# Patient Record
Sex: Female | Born: 1955
Health system: Northeastern US, Community
[De-identification: ages and names within clinical notes are randomized; demographics above are authoritative.]

## PROBLEM LIST (undated history)

## (undated) DIAGNOSIS — I10 Essential (primary) hypertension: Secondary | ICD-10-CM

## (undated) DIAGNOSIS — Z8679 Personal history of other diseases of the circulatory system: Secondary | ICD-10-CM

## (undated) DIAGNOSIS — F330 Major depressive disorder, recurrent, mild: Secondary | ICD-10-CM

## (undated) DIAGNOSIS — Z973 Presence of spectacles and contact lenses: Secondary | ICD-10-CM

## (undated) DIAGNOSIS — N183 Chronic kidney disease, stage 3 (moderate): Secondary | ICD-10-CM

## (undated) DIAGNOSIS — F410 Panic disorder [episodic paroxysmal anxiety] without agoraphobia: Secondary | ICD-10-CM

## (undated) HISTORY — DX: Presence of spectacles and contact lenses: Z97.3

## (undated) HISTORY — DX: Essential (primary) hypertension: I10

## (undated) HISTORY — DX: Major depressive disorder, recurrent, mild: F33.0

## (undated) HISTORY — PX: CATARACT REMOVAL INSERTION OF LENS: OPH121

## (undated) HISTORY — DX: Panic disorder (episodic paroxysmal anxiety): F41.0

## (undated) HISTORY — DX: Chronic kidney disease, stage 3 (moderate): N18.3

## (undated) HISTORY — PX: OB ANTEPARTUM CARE CESAREAN DLVR & POSTPARTUM: REP299

---

## 2016-10-20 HISTORY — PX: HEMORRHOID SURGERY: SHX153

## 2017-10-27 ENCOUNTER — Encounter (HOSPITAL_BASED_OUTPATIENT_CLINIC_OR_DEPARTMENT_OTHER): Payer: Self-pay | Admitting: Physician Assistant

## 2017-10-27 ENCOUNTER — Telehealth (HOSPITAL_BASED_OUTPATIENT_CLINIC_OR_DEPARTMENT_OTHER): Payer: Self-pay | Admitting: Physician Assistant

## 2017-10-27 ENCOUNTER — Ambulatory Visit (HOSPITAL_BASED_OUTPATIENT_CLINIC_OR_DEPARTMENT_OTHER): Payer: Medicaid Other | Admitting: Physician Assistant

## 2017-10-27 VITALS — BP 172/90 | HR 69 | Resp 16 | Ht 59.0 in | Wt 178.0 lb

## 2017-10-27 DIAGNOSIS — E785 Hyperlipidemia, unspecified: Secondary | ICD-10-CM

## 2017-10-27 DIAGNOSIS — I10 Essential (primary) hypertension: Secondary | ICD-10-CM

## 2017-10-27 DIAGNOSIS — F410 Panic disorder [episodic paroxysmal anxiety] without agoraphobia: Secondary | ICD-10-CM

## 2017-10-27 DIAGNOSIS — H8309 Labyrinthitis, unspecified ear: Secondary | ICD-10-CM

## 2017-10-27 LAB — BASIC METABOLIC PANEL
ANION GAP: 7 mmol/L (ref 5–15)
BUN (UREA NITROGEN): 23 mg/dL — ABNORMAL HIGH (ref 7–18)
CALCIUM: 9.4 mg/dL (ref 8.5–10.1)
CARBON DIOXIDE: 29 mmol/L (ref 21–32)
CHLORIDE: 104 mmol/L (ref 98–107)
CREATININE: 1.3 mg/dL — ABNORMAL HIGH (ref 0.4–1.2)
ESTIMATED GLOMERULAR FILT RATE: 41 mL/min — ABNORMAL LOW (ref >60)
Glucose Random: 87 mg/dL (ref 74–160)
POTASSIUM: 4.1 mmol/L (ref 3.5–5.1)
SODIUM: 140 mmol/L (ref 136–145)

## 2017-10-27 LAB — THYROID SCREEN TSH REFLEX FT4: THYROID SCREEN TSH REFLEX FT4: 3.28 u[IU]/mL (ref 0.358–3.740)

## 2017-10-27 LAB — LIPID PANEL
Cholesterol: 204 mg/dL (ref 0–239)
HIGH DENSITY LIPOPROTEIN: 35 mg/dL — ABNORMAL LOW (ref 40–?)
LOW DENSITY LIPOPROTEIN DIRECT: 144 mg/dL (ref 0–189)
TRIGLYCERIDES: 243 mg/dL — ABNORMAL HIGH (ref 0–150)

## 2017-10-27 MED ORDER — LISINOPRIL 40 MG PO TABS
40.0000 mg | ORAL_TABLET | Freq: Every day | ORAL | 0 refills | Status: DC
Start: 2017-10-27 — End: 2017-11-17

## 2017-10-27 MED ORDER — SERTRALINE HCL 50 MG PO TABS: tablet | ORAL | 0 refills | 0 days | Status: DC

## 2017-10-27 MED ORDER — ATENOLOL 50 MG PO TABS: 50 mg | tablet | Freq: Every day | ORAL | 0 refills | 0 days | Status: DC

## 2017-10-27 MED ORDER — ATENOLOL 50 MG PO TABS
50.0000 mg | ORAL_TABLET | Freq: Every day | ORAL | 0 refills | Status: DC
Start: 2017-10-27 — End: 2017-11-17

## 2017-10-27 MED ORDER — LISINOPRIL 40 MG PO TABS: 40 mg | tablet | Freq: Every day | ORAL | 0 refills | 0 days | Status: DC

## 2017-10-27 MED ORDER — SERTRALINE HCL 50 MG PO TABS
ORAL_TABLET | ORAL | 0 refills | Status: DC
Start: 2017-10-27 — End: 2017-12-20

## 2017-10-27 MED ORDER — CHLORTHALIDONE 25 MG PO TABS: 13 mg | tablet | Freq: Every day | ORAL | 0 refills | 0 days | Status: DC

## 2017-10-27 MED ORDER — MECLIZINE HCL 25 MG PO TABS: 25 mg | tablet | Freq: Three times a day (TID) | ORAL | 0 refills | 0 days | Status: AC | PRN

## 2017-10-27 MED ORDER — MECLIZINE HCL 25 MG PO TABS
25.0000 mg | ORAL_TABLET | Freq: Three times a day (TID) | ORAL | 0 refills | Status: DC | PRN
Start: 2017-10-27 — End: 2019-04-10

## 2017-10-27 MED ORDER — CHLORTHALIDONE 25 MG PO TABS
12.5000 mg | ORAL_TABLET | Freq: Every day | ORAL | 0 refills | Status: DC
Start: 2017-10-27 — End: 2017-11-17

## 2017-10-27 NOTE — Telephone Encounter (Signed)
-----   Message from Raymon Mutton, LPN sent at 06/27/1094 11:52 AM EDT -----  Patient has appointment  Soon needs benefit review for:  tdap

## 2017-10-27 NOTE — Progress Notes (Signed)
Nurse from [facility] called the [facility] Department to complete a benefit analysis for the tdap Vaccine.     The vaccine is covered under the patient’s prescription coverage.     Please choose Prior Auth

## 2017-10-27 NOTE — Progress Notes (Signed)
Review of Systems   Constitutional: Negative for chills, fever, malaise/fatigue and weight loss.   HENT: Negative for congestion, hearing loss and tinnitus.    Eyes: Negative for blurred vision, double vision and photophobia.   Respiratory: Negative for cough and shortness of breath.    Cardiovascular: Negative for chest pain, palpitations and leg swelling.   Gastrointestinal: Negative for abdominal pain, blood in stool, constipation, diarrhea, heartburn, melena, nausea and vomiting.   Genitourinary: Negative for dysuria, frequency, hematuria and urgency.   Musculoskeletal: Negative for joint pain and myalgias.   Skin: Negative for itching and rash.   Neurological: Negative for dizziness, tingling, tremors and headaches.            Subjective     Sara Snyder is a 62 year old female presents as new pt with family who help provide history.     H/o: HTN - takes atenolol-chlorthalidone daily, captopril 2-3 times per week when BP elevated. Brought BP cuff showing reading 114/76 earlier today when she was not feeling anxious, BP tends to be very labile.  States AM readings usually 110-120/60-80, sometimes lower in afternoon down to 90/50    Reports questionable abnormality in kidney per last PCP - unable to provide more details    H/o HLD, treated in [place].      H/o anxiety, panic disorder. On sertraline  daily, stopped 2 mo ago, ran out of medication. Felt better on medication, has been very anxious with racing and disorganized thoughts per family.     H/o labyrinthitis, sometimes dizzy with nausea. Requesting medication for flares.       There is no problem list on file for this patient.      No current outpatient medications on file.  No current facility-administered medications for this visit.   Review of Patient's Allergies indicates:  Allergies not on file  Social History     Socioeconomic History    Marital status: Married     Spouse name: Not on file    Number of children: Not on file    Years of  education: Not on file    Highest education level: Not on file   Occupational History    Not on file   Social Needs    Financial resource strain: Not on file    Food insecurity:     Worry: Not on file     Inability: Not on file    Transportation needs:     Medical: Not on file     Non-medical: Not on file   Tobacco Use    Smoking status: Not on file   Substance and Sexual Activity    Alcohol use: Not on file    Drug use: Not on file    Sexual activity: Not on file   Lifestyle    Physical activity:     Days per week: Not on file     Minutes per session: Not on file    Stress: Not on file   Relationships    Social connections:     Talks on phone: Not on file     Gets together: Not on file     Attends religious service: Not on file     Active member of club or organization: Not on file     Attends meetings of clubs or organizations: Not on file     Relationship status: Not on file    Intimate partner violence:     Fear of current or ex  partner: Not on file     Emotionally abused: Not on file     Physically abused: Not on file     Forced sexual activity: Not on file   Other Topics Concern    Not on file   Social History Narrative    Not on file     No past surgical history on file.  No family history on file.           Objective     All ROS reviewed and discussed and are otherwise negative unless listed above.         PHYSICAL EXAM:   10/27/17  1440 10/27/17  1449 10/27/17  1512   BP: (!) 188/104 (!) 164/82 (!) 172/90   Pulse: 69     Resp: 16     SpO2: 99%     Weight: 80.7 kg (178 lb)     Height:  (1.499 m)         GENERAL APPEARANCE -  A&Ox3, WDWN, NAD  HEENT - head normocephalic, atraumatic, EOMI, PERRLA, conjunctiva clear bilaterally, no occular discharge.   LUNGS - Lungs CTATB without WRR. Normal inspiratory effort.    CARDIOVASCULAR -  RRR without S3, S4. No murmurs, clicks, gallops or rubs.  ABDOMEN - Abdomen soft, NTND. BS normal. No masses, no hepatosplenomegaly.  Extrem - no tremor or  edema.   SKIN - skin color, texture, temperature, and turgor are normal in the exposed areas without rash or concerning lesions  Psych -  Appears stated age, well groomed. Behavior anxious and fidgety. Mood and affect inappropriately anxious, nervous, distracted. Appears to have fair judgement and insight.     A/P:  1. Essential hypertension  Uncontrolled/, unclear if 2/2 anxiety or  Under-medication. Pt to call with med doses. Given review of home BP cuff reading x 1 seems most likely white coat HTN. Will check labs, for now pt to continue current meds and call with dosing so we can make adjustments as needed. Attn: DASH diet, hydration, cardio, weight loss. RTO for f/u with PCP 2 weeks  - BASIC METABOLIC PANEL  - HEMOGLOBIN A1C    2. Panic disorder  Pt incredibly anxious, to the point that we are unable to make much progress re: plan in visit. Will restart ssri - low dose, go slow. Reviewed SE/risk/benefits. Pt to establish with psychotherapy/pharm. Will r/o hyperthyroidism, though suspect normal.   - sertraline (ZOLOFT) 50 MG tablet; 1 tab daily for 4 weeks then 2 tabs daily  Dispense: 150 tablet; Refill: 0  - RFL TO ADULT OUTPT PSYCH (NEW) [facility]  - THYROID SCREEN TSH REFLEX FT4    3. Labyrinthitis, unspecified laterality  H/o, didn't get to go into much detail re: hx, so unsure if positional. Meclizine prn, fluids, low salt diet. F/u prn with flares  - meclizine (ANTIVERT) 25 MG TABS; Take 1 tablet by mouth every 8 (eight) hours as needed  Dispense: 90 tablet; Refill: 0    4. Hyperlipidemia, unspecified hyperlipidemia type  Unclear if pt currently on treatment, will check home meds and contact. In the meantime check labs, calculate ASCVD risk, f/u with PCP 2 weeks. Attn: diet, exercise, weight loss.   - LIPID PANEL        I have reviewed the past medical, surgical, social and family history and updated these sections of EpicCare as relevant. All interim labs, test results, and consult notes were reviewed and  discussed with patient. Medications were reconciled during  this visit and a current medication list was given to the patient at the end of the visit.      follow up as above with PCP to establish care    [name], PA-C    >32min was spent with patient and at least 50% of that time was spent in counseling and coordination of care.

## 2017-10-28 ENCOUNTER — Other Ambulatory Visit (HOSPITAL_BASED_OUTPATIENT_CLINIC_OR_DEPARTMENT_OTHER): Payer: Self-pay

## 2017-10-28 LAB — HEMOGLOBIN A1C
ESTIMATED AVERAGE GLUCOSE: 117 (ref 74–160)
HEMOGLOBIN A1C: 5.7 % — ABNORMAL HIGH (ref 4.0–5.6)

## 2017-11-17 ENCOUNTER — Encounter (HOSPITAL_BASED_OUTPATIENT_CLINIC_OR_DEPARTMENT_OTHER): Payer: Self-pay | Admitting: Internal Medicine

## 2017-11-17 ENCOUNTER — Ambulatory Visit (HOSPITAL_BASED_OUTPATIENT_CLINIC_OR_DEPARTMENT_OTHER): Payer: Medicaid Other | Admitting: Internal Medicine

## 2017-11-17 ENCOUNTER — Telehealth (HOSPITAL_BASED_OUTPATIENT_CLINIC_OR_DEPARTMENT_OTHER): Payer: Self-pay

## 2017-11-17 VITALS — BP 150/80 | HR 70 | Temp 97.2°F | Resp 16 | Ht 59.0 in | Wt 178.0 lb

## 2017-11-17 DIAGNOSIS — F410 Panic disorder [episodic paroxysmal anxiety] without agoraphobia: Secondary | ICD-10-CM

## 2017-11-17 DIAGNOSIS — R05 Cough: Secondary | ICD-10-CM

## 2017-11-17 DIAGNOSIS — F419 Anxiety disorder, unspecified: Secondary | ICD-10-CM

## 2017-11-17 DIAGNOSIS — I10 Essential (primary) hypertension: Secondary | ICD-10-CM

## 2017-11-17 DIAGNOSIS — R058 Other specified cough: Secondary | ICD-10-CM

## 2017-11-17 DIAGNOSIS — M2550 Pain in unspecified joint: Secondary | ICD-10-CM

## 2017-11-17 DIAGNOSIS — Z1239 Encounter for other screening for malignant neoplasm of breast: Secondary | ICD-10-CM

## 2017-11-17 DIAGNOSIS — Z23 Encounter for immunization: Secondary | ICD-10-CM

## 2017-11-17 DIAGNOSIS — Z789 Other specified health status: Secondary | ICD-10-CM

## 2017-11-17 DIAGNOSIS — R0602 Shortness of breath: Secondary | ICD-10-CM

## 2017-11-17 DIAGNOSIS — Z1211 Encounter for screening for malignant neoplasm of colon: Secondary | ICD-10-CM

## 2017-11-17 LAB — MICROALBUMIN RANDOM URINE
ALB/CREAT RATIO URINE RAN: 10 ug/mg (ref 0–30)
ALBUMIN URINE RANDOM: 0.9 mg/dL (ref 0.0–1.9)
CREATININE RANDOM URINE: 97 mg/dl

## 2017-11-17 MED ORDER — ATENOLOL 50 MG PO TABS: 50 mg | tablet | Freq: Every day | ORAL | 6 refills | 0 days | Status: DC

## 2017-11-17 MED ORDER — CETIRIZINE HCL 10 MG PO TABS
10.0000 mg | ORAL_TABLET | Freq: Every day | ORAL | 4 refills | Status: DC
Start: 2017-11-17 — End: 2017-12-20

## 2017-11-17 MED ORDER — ATENOLOL 50 MG PO TABS
50.0000 mg | ORAL_TABLET | Freq: Every day | ORAL | 0 refills | Status: DC
Start: 2017-11-17 — End: 2017-11-17

## 2017-11-17 MED ORDER — CLONAZEPAM 2 MG PO TABS
2.0000 mg | ORAL_TABLET | Freq: Every evening | ORAL | 0 refills | Status: DC
Start: 2017-11-17 — End: 2017-12-20

## 2017-11-17 MED ORDER — CHLORTHALIDONE 25 MG PO TABS
12.5000 mg | ORAL_TABLET | Freq: Every day | ORAL | 0 refills | Status: DC
Start: 2017-11-17 — End: 2017-11-17

## 2017-11-17 MED ORDER — CALCIUM CARBONATE-VITAMIN D 500-400 MG-UNIT PO TABS: 1 | tablet | Freq: Two times a day (BID) | ORAL | 11 refills | 0 days | Status: AC

## 2017-11-17 MED ORDER — CLONAZEPAM 2 MG PO TABS: 2 mg | tablet | Freq: Every evening | ORAL | 0 refills | 0 days | Status: DC

## 2017-11-17 MED ORDER — ATENOLOL 50 MG PO TABS: 50 mg | tablet | Freq: Every day | ORAL | 0 refills | 0 days | Status: DC

## 2017-11-17 MED ORDER — FLUTICASONE PROPIONATE 50 MCG/ACT NA SUSP: 1 | Bottle | Freq: Every day | NASAL | 5 refills | 0 days | Status: AC

## 2017-11-17 MED ORDER — FLUTICASONE PROPIONATE 50 MCG/ACT NA SUSP
1.0000 | Freq: Every day | NASAL | 5 refills | Status: DC
Start: 2017-11-17 — End: 2018-09-13

## 2017-11-17 MED ORDER — CHLORTHALIDONE 25 MG PO TABS: 13 mg | tablet | Freq: Every day | ORAL | 0 refills | 0 days | Status: DC

## 2017-11-17 MED ORDER — ACETAMINOPHEN 500 MG PO TABS: 500 mg | tablet | Freq: Four times a day (QID) | ORAL | 3 refills | 0 days | Status: AC | PRN

## 2017-11-17 MED ORDER — CALCIUM CARBONATE-VITAMIN D 500-400 MG-UNIT PO TABS
1.0000 | ORAL_TABLET | Freq: Two times a day (BID) | ORAL | 11 refills | Status: DC
Start: 2017-11-17 — End: 2018-09-13

## 2017-11-17 MED ORDER — CETIRIZINE HCL 10 MG PO TABS: 10 mg | tablet | Freq: Every day | ORAL | 4 refills | 0 days | Status: DC

## 2017-11-17 MED ORDER — CHLORTHALIDONE 25 MG PO TABS
12.5000 mg | ORAL_TABLET | Freq: Every day | ORAL | 6 refills | Status: DC
Start: 2017-11-17 — End: 2017-12-20

## 2017-11-17 MED ORDER — CHLORTHALIDONE 25 MG PO TABS: 13 mg | tablet | Freq: Every day | ORAL | 6 refills | 0 days | Status: DC

## 2017-11-17 MED ORDER — ATENOLOL 50 MG PO TABS
50.0000 mg | ORAL_TABLET | Freq: Every day | ORAL | 6 refills | Status: DC
Start: 2017-11-17 — End: 2017-12-20

## 2017-11-17 MED ORDER — ACETAMINOPHEN 500 MG PO TABS
500.0000 mg | ORAL_TABLET | Freq: Four times a day (QID) | ORAL | 3 refills | Status: DC | PRN
Start: 2017-11-17 — End: 2018-09-13

## 2017-11-17 NOTE — Progress Notes (Signed)
11/17/2017  VIS given prior to administration and reviewed with the patient and or legal guardian. Patient understands the disease and the vaccine. See immunization/Injection module or chart review for date of publication and additional information.  Anvika F Romolo Sieling,LPN

## 2017-11-17 NOTE — Telephone Encounter (Signed)
-----   Message from Raymon Mutton, LPN sent at 0/98/1191 12:09 PM EDT -----  Patient has appointment  Soon needs benefit review for:  tdap

## 2017-11-17 NOTE — H&P (Signed)
Pt feels safe at home.  Leala Bryand, 11/17/2017

## 2017-11-17 NOTE — Progress Notes (Unsigned)
nurse from [facility] called the [facility] to complete a benefit analysis for the tdap Vaccine.    The vaccine is covered under the patient’s hsn medical coverage.    Please choose Private

## 2017-11-17 NOTE — Progress Notes (Signed)
HPI  Sara Snyder is a 62 year old female presenting with c/o knee pain    - Pt has chronic bilateral knee and foot pain  - She requests medication to help with this pain  - Does not take Calcium or Vit D    HTN  Most Recent BP Reading(s)  11/17/17 : (!) 150/80  10/27/17 : (!) 172/90  - Taking chlorthalidone 12.5 mg daily and atenolol 50 mg daily  - She is no longer taking lisinopril, d/c'd because it gave her LE swelling  - Pt has never taken amlodipine    Anxiety/Panic disorder  - H/o anxiety, panic disorder  - Taking sertraline 50 mg BID  - Also akes clonazepam 2 mg nightly to help her sleep  - Requests clonazepam refill    Cough  - Pt has an allergic cough    Patient Active Problem List:     Essential hypertension     Labyrinthitis    Social History     Socioeconomic History    Marital status: Married     Spouse name: Not on file    Number of children: Not on file    Years of education: Not on file    Highest education level: Not on file   Occupational History    Not on file   Social Needs    Financial resource strain: Not on file    Food insecurity:     Worry: Not on file     Inability: Not on file    Transportation needs:     Medical: Not on file     Non-medical: Not on file   Tobacco Use    Smoking status: Former Smoker     Packs/day: 2.00     Years: 25.00     Pack years: 50.00     Last attempt to quit: 10/27/1977     Years since quitting: 40.0    Smokeless tobacco: Never Used   Substance and Sexual Activity    Alcohol use: Not Currently    Drug use: Never    Sexual activity: Not on file   Lifestyle    Physical activity:     Days per week: Not on file     Minutes per session: Not on file    Stress: Not on file   Relationships    Social connections:     Talks on phone: Not on file     Gets together: Not on file     Attends religious service: Not on file     Active member of club or organization: Not on file     Attends meetings of clubs or organizations: Not on file     Relationship status: Not  on file    Intimate partner violence:     Fear of current or ex partner: Not on file     Emotionally abused: Not on file     Physically abused: Not on file     Forced sexual activity: Not on file   Other Topics Concern    Not on file   Social History Narrative    Not on file     Past Surgical History:  10/2016: HEMORRHOID SURGERY  No date: OB ANTEPARTUM CARE CESAREAN DLVR & POSTPARTUM  No family history on file.      Current Outpatient Medications:  chlorthalidone (HYGROTEN) 25 MG tablet Take 0.5 tablets by mouth daily Disp: 15 tablet Rfl: 6   atenolol (TENORMIN) 50 MG tablet Take 1  tablet by mouth daily Disp: 30 tablet Rfl: 6   clonazePAM (KLONOPIN) 2 MG tablet Take 2 mg by mouth nightly Disp:  Rfl:    sertraline (ZOLOFT) 50 MG tablet 1 tab daily for 4 weeks then 2 tabs daily Disp: 150 tablet Rfl: 0   lisinopril (PRINIVIL,ZESTRIL) 40 MG tablet Take 1 tablet by mouth daily Disp: 30 tablet Rfl: 0   meclizine (ANTIVERT) 25 MG TABS Take 1 tablet by mouth every 8 (eight) hours as needed Disp: 90 tablet Rfl: 0     No current facility-administered medications for this visit.   Review of Patient's Allergies indicates:  No Known Allergies  Review of Systems   Constitution: Negative.   HENT: Negative.    Eyes: Negative.    Cardiovascular: Negative.    Respiratory: Positive for cough.    Skin: Negative.    Musculoskeletal: Positive for joint pain.   Gastrointestinal: Negative.    Genitourinary: Negative.    Neurological: Negative.    Psychiatric/Behavioral: The patient is nervous/anxious.    Allergic/Immunologic: Negative.      BP (!) 150/80 (Site: LA, Position: Sitting, Cuff Size: Reg)  Pulse 70  Temp 97.2 F (36.2 C) (Temporal)  Resp 16  Ht  (1.499 m)  Wt 80.7 kg (178 lb)  SpO2 98%  BMI 35.95 kg/m2  Physical Exam   Constitutional: She is oriented to person, place, and time and well-developed, well-nourished, and in no distress.   Pulmonary/Chest: Effort normal and breath sounds normal. She has no wheezes.    Neurological: She is alert and oriented to person, place, and time. Gait normal.   Skin: Skin is warm and dry.   Psychiatric: Mood, memory and judgment normal.   Nursing note and vitals reviewed.    Assessment /Plan   1. Essential hypertension  - Not well controlled, BP: 150/80 today  - Taking atenolol 50 mg daily and chlorthalidone 12.5 mg daily  - Will increase chlorthalidone dose from 12.5 mg daily -> 25 mg daily  - Check BP daily, record log and bring to next visit  - Will create plan next visit based on BP log and lab results  - Check MICROALBUMIN RANDOM URINE  - F/u in 3-4 weeks    2. Anxiety  3. Panic disorder  - Pt has chronic anxiety w/ panic disorder  - Takes sertraline 50 mg BID and clonazepam 2 mg nightly, this is working well for her  - Pt requests clonazepam refill as she has ran out  - Hesitant to start prescribing clonazepam 2 mg due to her decreased kidney functions and because of potential long term effects  Will Rx 1 mo supply of clonazepam, then pt will start managing meds w/ pscyhiatry, she has apt in 2 weeks  - Rx clonazePAM (KLONOPIN) 2 MG tablet; Take 1 tablet by mouth nightly  Dispense: 30 tablet; Refill: 0    4. Multiple joint pain  - chronic bilateral knee and foot pain  - Rx acetaminophen (TYLENOL) 500 MG tablet; Take 1 tablet by mouth every 6 (six) hours as needed for Pain  Dispense: 30 tablet; Refill: 3  - Rx calcium-vitamin D (OSCAL-500) 500-400 MG-UNIT per tablet; Take 1 tablet by mouth 2 (two) times daily  Dispense: 60 tablet; Refill: 11  - f/u if symptoms worsen or fail to improve    5. Need for prophylactic vaccination with combined diphtheria-tetanus-pertussis (DTP) vaccine  - Routine vaccination  - TDAP VACCINE 7 AND OLDER IM    6. Allergic  cough  - Pt has allergic cough  - Rx fluticasone (FLONASE) 50 MCG/ACT nasal spray; 1 spray by Each Nostril route daily  Dispense: 1 Bottle; Refill: 5  - Rx etirizine (ZYRTEC) 10 MG tablet; Take 1 tablet by mouth daily  Dispense: 30 tablet;  Refill: 4  - f/u if symptoms worsen or fail to improve    7. Shortness of breath  - Pt has occasional SOB  - May be due to panic attacks  - Will order echo to r/o cardiac etiology  - ECHOCARDIOGRAM WITH DOPPLER; Future    8. Screening for malignant neoplasm of breast  - Routine screening  - DIGITAL SCREEN MAM WCAD&TOM; Future    9. Special screening for malignant neoplasms, colon  - Routine screening  - POC IMMUNOASSAY FECAL OCCULT BLOOD TEST; Future      I have spent 40 minutes in face to face time with this patient/patient proxy of which > 50% was in counseling or coordination of care regarding above issues/Dx.    We discussed the importance of medication compliance. The patient was ready to learn and no apparent learning barriers were identified. I explained the diagnosis and treatment plan, and the patient expressed understanding of the content. Possible side effects of the prescribed medication(s) were explained. I attempted to answer any questions regarding the diagnosis and the proposed treatment.    We discussed the patients current medications. The patient expressed understanding and no barriers to adherence were identified.    Jean Rosenthal Fulk-Logon

## 2017-11-18 ENCOUNTER — Encounter (HOSPITAL_BASED_OUTPATIENT_CLINIC_OR_DEPARTMENT_OTHER): Payer: Self-pay

## 2017-11-29 ENCOUNTER — Encounter (HOSPITAL_BASED_OUTPATIENT_CLINIC_OR_DEPARTMENT_OTHER): Payer: Self-pay | Admitting: Physician Assistant

## 2017-11-29 DIAGNOSIS — E785 Hyperlipidemia, unspecified: Secondary | ICD-10-CM | POA: Insufficient documentation

## 2017-11-29 DIAGNOSIS — F410 Panic disorder [episodic paroxysmal anxiety] without agoraphobia: Secondary | ICD-10-CM | POA: Insufficient documentation

## 2017-11-29 HISTORY — DX: Panic disorder (episodic paroxysmal anxiety): F41.0

## 2017-11-30 ENCOUNTER — Ambulatory Visit (HOSPITAL_BASED_OUTPATIENT_CLINIC_OR_DEPARTMENT_OTHER): Payer: Medicaid Other

## 2017-12-02 ENCOUNTER — Ambulatory Visit (HOSPITAL_BASED_OUTPATIENT_CLINIC_OR_DEPARTMENT_OTHER): Payer: Medicaid Other

## 2017-12-02 DIAGNOSIS — Z1211 Encounter for screening for malignant neoplasm of colon: Secondary | ICD-10-CM

## 2017-12-02 LAB — POC IMMUNOASSAY FECAL OCCULT BLOOD TEST: POC FECAL OCCULT BLOOD TEST (IMMUNOASSAY): NEGATIVE

## 2017-12-02 NOTE — Progress Notes (Signed)
Pt dropped off IFOB specimen.  Specimen collected on 11/29/17  Released IFOB open order.  Tested specimen, result is negative.  Created and sent letter to pt.  Umer Harig, 12/02/2017

## 2017-12-07 ENCOUNTER — Ambulatory Visit (HOSPITAL_BASED_OUTPATIENT_CLINIC_OR_DEPARTMENT_OTHER): Payer: Self-pay | Admitting: Internal Medicine

## 2017-12-15 ENCOUNTER — Ambulatory Visit (HOSPITAL_BASED_OUTPATIENT_CLINIC_OR_DEPARTMENT_OTHER): Payer: Self-pay | Admitting: Internal Medicine

## 2017-12-15 ENCOUNTER — Encounter (HOSPITAL_BASED_OUTPATIENT_CLINIC_OR_DEPARTMENT_OTHER): Payer: Self-pay

## 2017-12-15 ENCOUNTER — Encounter (HOSPITAL_BASED_OUTPATIENT_CLINIC_OR_DEPARTMENT_OTHER): Payer: Self-pay | Admitting: Internal Medicine

## 2017-12-16 ENCOUNTER — Ambulatory Visit (HOSPITAL_BASED_OUTPATIENT_CLINIC_OR_DEPARTMENT_OTHER): Payer: Medicaid Other | Admitting: Internal Medicine

## 2017-12-20 ENCOUNTER — Ambulatory Visit: Payer: No Typology Code available for payment source

## 2017-12-20 ENCOUNTER — Ambulatory Visit: Payer: No Typology Code available for payment source | Attending: Internal Medicine | Admitting: Internal Medicine

## 2017-12-20 ENCOUNTER — Encounter (HOSPITAL_BASED_OUTPATIENT_CLINIC_OR_DEPARTMENT_OTHER): Payer: Self-pay | Admitting: Internal Medicine

## 2017-12-20 VITALS — BP 133/76 | HR 61 | Temp 98.2°F | Resp 14 | Ht 59.06 in | Wt 178.0 lb

## 2017-12-20 DIAGNOSIS — R05 Cough: Secondary | ICD-10-CM

## 2017-12-20 DIAGNOSIS — I10 Essential (primary) hypertension: Secondary | ICD-10-CM

## 2017-12-20 DIAGNOSIS — F419 Anxiety disorder, unspecified: Secondary | ICD-10-CM

## 2017-12-20 DIAGNOSIS — F410 Panic disorder [episodic paroxysmal anxiety] without agoraphobia: Secondary | ICD-10-CM

## 2017-12-20 DIAGNOSIS — H538 Other visual disturbances: Secondary | ICD-10-CM | POA: Diagnosis present

## 2017-12-20 DIAGNOSIS — J309 Allergic rhinitis, unspecified: Secondary | ICD-10-CM | POA: Diagnosis not present

## 2017-12-20 DIAGNOSIS — R053 Chronic cough: Secondary | ICD-10-CM | POA: Insufficient documentation

## 2017-12-20 MED ORDER — CHLORTHALIDONE 25 MG PO TABS
25.0000 mg | ORAL_TABLET | Freq: Every day | ORAL | 6 refills | Status: DC
Start: 2017-12-20 — End: 2017-12-28

## 2017-12-20 MED ORDER — CHLORTHALIDONE 25 MG PO TABS: 25 mg | tablet | Freq: Every day | ORAL | 6 refills | 0 days | Status: DC

## 2017-12-20 MED ORDER — CLONAZEPAM 2 MG PO TABS
2.0000 mg | ORAL_TABLET | Freq: Every evening | ORAL | 0 refills | Status: DC
Start: 2017-12-20 — End: 2017-12-28

## 2017-12-20 MED ORDER — SERTRALINE HCL 50 MG PO TABS: 50 mg | tablet | Freq: Every day | ORAL | 3 refills | 0 days | Status: DC

## 2017-12-20 MED ORDER — ATENOLOL 50 MG PO TABS
50.0000 mg | ORAL_TABLET | Freq: Every day | ORAL | 6 refills | Status: DC
Start: 2017-12-20 — End: 2017-12-28

## 2017-12-20 MED ORDER — FLUTICASONE PROPIONATE HFA 110 MCG/ACT IN AERO
1.0000 | INHALATION_SPRAY | Freq: Two times a day (BID) | RESPIRATORY_TRACT | 0 refills | Status: DC
Start: 2017-12-20 — End: 2017-12-28

## 2017-12-20 MED ORDER — LORATADINE 10 MG PO TABS
10.0000 mg | ORAL_TABLET | Freq: Every day | ORAL | 2 refills | Status: DC
Start: 2017-12-20 — End: 2017-12-28

## 2017-12-20 MED ORDER — ATENOLOL 50 MG PO TABS: 50 mg | tablet | Freq: Every day | ORAL | 6 refills | 0 days | Status: DC

## 2017-12-20 MED ORDER — CLONAZEPAM 2 MG PO TABS: 2 mg | tablet | Freq: Every evening | ORAL | 0 refills | 0 days | Status: DC

## 2017-12-20 MED ORDER — SERTRALINE HCL 50 MG PO TABS
50.0000 mg | ORAL_TABLET | Freq: Every day | ORAL | 3 refills | Status: DC
Start: 2017-12-20 — End: 2017-12-28

## 2017-12-20 MED ORDER — LORATADINE 10 MG PO TABS: 10 mg | tablet | Freq: Every day | ORAL | 2 refills | 0 days | Status: DC

## 2017-12-20 MED ORDER — FLUTICASONE PROPIONATE HFA 110 MCG/ACT IN AERO: 1 | Inhaler | Freq: Two times a day (BID) | RESPIRATORY_TRACT | 0 refills | 0 days | Status: DC

## 2017-12-20 NOTE — Assessment & Plan Note (Deleted)
Prior X-ray did not show anything. Pt has been advised to see a lung specialist to investigate possibilities of Asthma or Allergies

## 2017-12-20 NOTE — Progress Notes (Signed)
Patient feel safe at home. sm

## 2017-12-20 NOTE — Progress Notes (Addendum)
HPI  Sara CorwinMaria Snyder is a 62 year old female presenting for persistent cough.     Cough  -Dry Cough has been constant for two months  -Pt feels as if it is a constant cold or allergies  -Cough is worse at night, runny nose from 7-11 am  -No fever, no phlegm   -Some chest pain due to cough  -Cough was not present when pt recently visited [place] or ate cold foods previously  -pt has stopped eating cold foods as precautions, but cough still persists   -Was prescribed Flonase, did not help  -ER doctor gave pt an inhaler, did not help  -Prior X-ray did not show anything  -Pt does not use any allergy medication    Anxiety  -Pt feels panic associated with cough  -Has not been taking anxiety medications    Vision  -Needs to see eye doctor due to vision problems      Patient Active Problem List:     Essential hypertension     Labyrinthitis     Panic disorder     Hyperlipidemia    Social History     Socioeconomic History    Marital status: Married     Spouse name: Not on file    Number of children: Not on file    Years of education: Not on file    Highest education level: Not on file   Occupational History    Not on file   Social Needs    Financial resource strain: Not on file    Food insecurity:     Worry: Not on file     Inability: Not on file    Transportation needs:     Medical: Not on file     Non-medical: Not on file   Tobacco Use    Smoking status: Former Smoker     Packs/day: 2.00     Years: 25.00     Pack years: 50.00     Last attempt to quit: 10/27/1977     Years since quitting: 40.1    Smokeless tobacco: Never Used   Substance and Sexual Activity    Alcohol use: Not Currently    Drug use: Never    Sexual activity: Not on file   Lifestyle    Physical activity:     Days per week: Not on file     Minutes per session: Not on file    Stress: Not on file   Relationships    Social connections:     Talks on phone: Not on file     Gets together: Not on file     Attends religious service: Not on file     Active  member of club or organization: Not on file     Attends meetings of clubs or organizations: Not on file     Relationship status: Not on file    Intimate partner violence:     Fear of current or ex partner: Not on file     Emotionally abused: Not on file     Physically abused: Not on file     Forced sexual activity: Not on file   Other Topics Concern    Not on file   Social History Narrative    Not on file     Past Surgical History:  10/2016: HEMORRHOID SURGERY  No date: OB ANTEPARTUM CARE CESAREAN DLVR & POSTPARTUM  No family history on file.      Current Outpatient Medications:  chlorthalidone (HYGROTEN) 25 MG tablet Take 0.5 tablets by mouth daily Disp: 15 tablet Rfl: 6   atenolol (TENORMIN) 50 MG tablet Take 1 tablet by mouth daily Disp: 30 tablet Rfl: 6   clonazePAM (KLONOPIN) 2 MG tablet Take 1 tablet by mouth nightly Disp: 30 tablet Rfl: 0   acetaminophen (TYLENOL) 500 MG tablet Take 1 tablet by mouth every 6 (six) hours as needed for Pain Disp: 30 tablet Rfl: 3   fluticasone (FLONASE) 50 MCG/ACT nasal spray 1 spray by Each Nostril route daily Disp: 1 Bottle Rfl: 5   cetirizine (ZYRTEC) 10 MG tablet Take 1 tablet by mouth daily Disp: 30 tablet Rfl: 4   calcium-vitamin D (OSCAL-500) 500-400 MG-UNIT per tablet Take 1 tablet by mouth 2 (two) times daily Disp: 60 tablet Rfl: 11   sertraline (ZOLOFT) 50 MG tablet 1 tab daily for 4 weeks then 2 tabs daily Disp: 150 tablet Rfl: 0     No current facility-administered medications for this visit.   Review of Patient's Allergies indicates:  No Known Allergies  ROS    BP 133/76 (Site: RA, Position: Sitting, Cuff Size: Reg)  Pulse 61  Temp 98.2 F (36.8 C) (Oral)  Resp 14  Ht 4' 11.06" (1.5 m)  Wt 80.7 kg (178 lb)  SpO2 98%  BMI 35.88 kg/m2     Physical Exam   Pulmonary/Chest: Effort normal and breath sounds normal.   Physical Exam   Constitutional: She is oriented to person, place, and time and well-developed, well-nourished, and in no distress.   Neurological: She is  alert and oriented to person, place, and time. Gait normal.   Skin: Skin is warm and dry.   Psychiatric: Mood, memory, affect and judgment normal.   Nursing note and vitals reviewed.        Assessment /Plan   (R05) Chronic cough  (primary encounter diagnosis)  Comment:  Likely allergic component , also c/o scratching in her throat   Prior X-ray did not show anything as per patient, recent ED visit ,get records   Plan: Pt prescribed Claritin 1x a day in the morning  Pt has been given inhaler to use and has been advised to rinse mouth post use. 2x a day once in morning and night       ekg with [facility] , echo is ordered     :      (J30.9) Allergic rhinitis, unspecified seasonality, unspecified trigger  Comment:   Plan: flonase not helping   Will ask her to try claritin     (H53.8) Blurred vision  Comment:   Plan: REFERRAL TO OPHTHALMOLOGY ( INT)            (I10) Essential hypertension  Comment:refill meds well controlled   Plan: chlorthalidone (HYGROTEN) 25 MG tablet,         atenolol (TENORMIN) 50 MG tablet            (F41.0) Panic disorder  Comment:     Plan: sertraline (ZOLOFT) 50 MG tablet  Pt is also on clonazepam 2mg    Pt was supposed to follow up with psych and she no showed  Requested care partner sandra to talk to her   Pt with multiple issues   ecnouraged to return in 4 weeks to discuss other issues patient may have     C/o hair loss, several other c/o will be addressed in other visit   Need 40 min  I have spent 40 minutes in face to face time with this patient/patient proxy of which > 50% was in counseling or coordination of care regarding above issues/Dx.      We discussed the importance of medication compliance. The patient was ready to learn and no apparent learning barriers were identified. I explained the diagnosis and treatment plan, and the patient expressed understanding of the content. Possible side effects of the prescribed medication(s) were explained. I attempted to answer any questions  regarding the diagnosis and the proposed treatment.    We discussed the patients current medications. The patient expressed understanding and no barriers to adherence were identified.    This documentation serves as a record of the services and decisions personally performed by Overton Mam MD. It was created by Oren Binet (medical scribe) and was based on the provider's statements to me.

## 2017-12-20 NOTE — Progress Notes (Signed)
Office Visit with Mental Health Care Partner in Primary Care    [facility] does a warm hand-off with Mental Health Care Partner.      PROBLEM: Anxiety    ASSESSMENT:   [place], female, 62 yo.  Moved to [place] few [place] ago. Has been babysitting a 493 yo child at her house.  Lives with her husband. Has two adult daughters here. One of the daughters live at the same building than her. Has some conflicts with her daughter what makes her more agitated.    Reports long history of anxiety. Was in treatment in [place]. Had an appointment with psychiatrist Noralyn PickEdilia Gomes on 11/30/17 but did not show up. We called [facility] and appointment was rescheduled for 01/05/18.     She does not sleep well. She loves to play games on her computer or cell phone. Spend several hours on the screen. Goes to bed between midnight and 1 am playing games.   Drinks one cup of coffee in the morning.   Does not exercise. Usually does not go out. "I have pain in my legs and cannot walk much".    She is open for psychotherapy and psycho pharm.     INTERVENTION:   - Emotional support  - Reviewed sleep hygiene (suggested to decrease screen time at least half an hour before bed). Try to read a book or her Bible instead.   - Encouraged physical activities (walk or take the child she babysitt to the park...)  - Discussed about breathing exercises (showed the app Breathe2Relax and practiced breathing exercises during visit. Husband was present at the visit and very supportive about relaxation techniques and says: "I always tell her and shows her some exercises I find on Youtube but she does not listen to me".     PLAN:   - Initiate treatment with psychiatrist on 01/05/18  - Make changes on her sleep routine  - Increase physical activities  - Return as needed.     Bobbe MedicoSandra Criag Wicklund, 12/20/2017

## 2017-12-24 LAB — EKG

## 2017-12-27 ENCOUNTER — Encounter (HOSPITAL_BASED_OUTPATIENT_CLINIC_OR_DEPARTMENT_OTHER): Payer: Self-pay | Admitting: Internal Medicine

## 2017-12-27 DIAGNOSIS — I10 Essential (primary) hypertension: Secondary | ICD-10-CM

## 2017-12-27 DIAGNOSIS — F419 Anxiety disorder, unspecified: Secondary | ICD-10-CM

## 2017-12-27 DIAGNOSIS — F410 Panic disorder [episodic paroxysmal anxiety] without agoraphobia: Secondary | ICD-10-CM

## 2017-12-28 MED ORDER — FLUTICASONE PROPIONATE HFA 110 MCG/ACT IN AERO: 1 | Inhaler | Freq: Two times a day (BID) | RESPIRATORY_TRACT | 0 refills | 0 days | Status: AC

## 2017-12-28 MED ORDER — CLONAZEPAM 2 MG PO TABS
2.0000 mg | ORAL_TABLET | Freq: Every evening | ORAL | 0 refills | Status: DC
Start: 2017-12-28 — End: 2018-02-03

## 2017-12-28 MED ORDER — SERTRALINE HCL 50 MG PO TABS: 50 mg | tablet | Freq: Every day | ORAL | 3 refills | 0 days | Status: DC

## 2017-12-28 MED ORDER — CHLORTHALIDONE 25 MG PO TABS: 25 mg | tablet | Freq: Every day | ORAL | 6 refills | 0 days | Status: DC

## 2017-12-28 MED ORDER — LORATADINE 10 MG PO TABS: 10 mg | tablet | Freq: Every day | ORAL | 2 refills | 0 days | Status: AC

## 2017-12-28 MED ORDER — CLONAZEPAM 2 MG PO TABS: 2 mg | tablet | Freq: Every evening | ORAL | 0 refills | 0 days | Status: DC

## 2017-12-28 MED ORDER — SERTRALINE HCL 50 MG PO TABS
50.0000 mg | ORAL_TABLET | Freq: Every day | ORAL | 3 refills | Status: DC
Start: 2017-12-28 — End: 2018-01-05

## 2017-12-28 MED ORDER — CHLORTHALIDONE 25 MG PO TABS
25.0000 mg | ORAL_TABLET | Freq: Every day | ORAL | 6 refills | Status: DC
Start: 2017-12-28 — End: 2018-03-28

## 2017-12-28 MED ORDER — LORATADINE 10 MG PO TABS
10.0000 mg | ORAL_TABLET | Freq: Every day | ORAL | 2 refills | Status: DC
Start: 2017-12-28 — End: 2018-09-13

## 2017-12-28 MED ORDER — ATENOLOL 50 MG PO TABS
50.0000 mg | ORAL_TABLET | Freq: Every day | ORAL | 6 refills | Status: DC
Start: 2017-12-28 — End: 2018-06-29

## 2017-12-28 MED ORDER — FLUTICASONE PROPIONATE HFA 110 MCG/ACT IN AERO
1.0000 | INHALATION_SPRAY | Freq: Two times a day (BID) | RESPIRATORY_TRACT | 0 refills | Status: DC
Start: 2017-12-28 — End: 2019-07-07

## 2017-12-28 MED ORDER — ATENOLOL 50 MG PO TABS: 50 mg | tablet | Freq: Every day | ORAL | 6 refills | 0 days | Status: AC

## 2018-01-05 ENCOUNTER — Ambulatory Visit: Payer: No Typology Code available for payment source | Attending: Physician Assistant

## 2018-01-05 DIAGNOSIS — F410 Panic disorder [episodic paroxysmal anxiety] without agoraphobia: Secondary | ICD-10-CM | POA: Diagnosis not present

## 2018-01-05 DIAGNOSIS — F330 Major depressive disorder, recurrent, mild: Secondary | ICD-10-CM

## 2018-01-05 HISTORY — DX: Major depressive disorder, recurrent, mild: F33.0

## 2018-01-05 MED ORDER — SERTRALINE HCL 100 MG PO TABS
100.0000 mg | ORAL_TABLET | Freq: Every day | ORAL | 0 refills | Status: DC
Start: 2018-01-05 — End: 2018-02-03

## 2018-01-05 MED ORDER — SERTRALINE HCL 100 MG PO TABS: 100 mg | tablet | Freq: Every day | ORAL | 0 refills | 0 days | Status: DC
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
## 2018-02-03 ENCOUNTER — Ambulatory Visit: Payer: No Typology Code available for payment source

## 2018-02-03 DIAGNOSIS — F330 Major depressive disorder, recurrent, mild: Secondary | ICD-10-CM | POA: Diagnosis not present

## 2018-02-03 DIAGNOSIS — F410 Panic disorder [episodic paroxysmal anxiety] without agoraphobia: Secondary | ICD-10-CM | POA: Diagnosis not present

## 2018-02-03 MED ORDER — SERTRALINE HCL 100 MG PO TABS: 100 mg | tablet | Freq: Every day | ORAL | 2 refills | 0 days | Status: DC

## 2018-02-03 MED ORDER — CLONAZEPAM 1 MG PO TABS: 1 mg | tablet | Freq: Two times a day (BID) | ORAL | 2 refills | 0 days | Status: DC

## 2018-02-03 MED ORDER — TRAZODONE HCL 50 MG PO TABS: 50 mg | tablet | Freq: Every evening | ORAL | 2 refills | 0 days | Status: DC

## 2018-02-03 MED ORDER — SERTRALINE HCL 100 MG PO TABS
100.0000 mg | ORAL_TABLET | Freq: Every day | ORAL | 2 refills | Status: DC
Start: 2018-02-03 — End: 2018-03-28

## 2018-02-03 MED ORDER — CLONAZEPAM 1 MG PO TABS
1.0000 mg | ORAL_TABLET | Freq: Two times a day (BID) | ORAL | 2 refills | Status: DC
Start: 2018-02-03 — End: 2018-03-28

## 2018-02-03 MED ORDER — TRAZODONE HCL 50 MG PO TABS
50.0000 mg | ORAL_TABLET | Freq: Every evening | ORAL | 2 refills | Status: DC
Start: 2018-02-03 — End: 2018-03-28

## 2018-02-03 MED FILL — CLONAZEPAM  1MG: 30 days supply | Qty: 60 | Fill #0 | Status: CP

## 2018-02-03 MED FILL — SERTRALINE 100MG: 30 days supply | Qty: 30 | Fill #0 | Status: CP

## 2018-02-03 MED FILL — TRAZODONE  50MG: 30 days supply | Qty: 30 | Fill #0 | Status: CP

## 2018-02-03 NOTE — Progress Notes (Signed)
OUTPATIENT PSYCHIATRY PROGRESS NOTE      INTERPRETER: None needed.  INTERPRETER NAME: N/A      CONTACT INFO FOR OTHER AGENCIES AND MENTAL HEALTH PROVIDERS:   PCP: Overton Mam, MD      FOCUS OF CURRENT SESSION:  Problem 1: Depression   Problem 2: Anxiety        SUBJECTIVE:  TODAY'S CHIEF COMPLAINT AND CLINICAL UPDATES IN PATIENT'S WORDS:  1) Chief Complaint:  "I am feeling worried"       2) new information from patient and/or collateral:  This is our second visit. Patient is accompanied by her husband. She gives permission for her husband to participate fully in the session.   Initially, pt had difficulties focusing on her issues. She wanted to discuss her husband's problems instead.   She had difficulties ending the session on time.     She needed a lot of redirection. She reported feeling anxious due to her husband's recent diagnosis of kidney cancer.   He is going to have surgery to have one kidney removed.    She is concerned with the fact that her husband will have to take some time off of work.   They live with their daughter and they don't want to be burden to her family.   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      Patient is also upset because her daughters are arguing with each other regarding her husband's cancer diagnosis.   One of the daughters does not want him to have surgery.   Pt's husband indicated that he does not believe that he has cancer, but he is willing to have surgery.     Pt is feeling anxious with daily panic attacks. She continues taking 2 mg of Clonazepam at night.   She is not sleeping well at night.   She is requesting a sleep aid and a medication for anxiety during the day.     The dose of Sertraline was increased from 50 to 100 mg during our last visit.   She is not sure if the increased dose was helpful.   She does not want to increase it further despite increased anxiety.     She continues to feel sad,  but she denied SI, HI, psychosis, substance abuse problems.   She continues to struggle with labyrinthitis and chronic coffee of unknown cause.       OBJECTIVE:  DATA REVIEWED: Chart        CURRENT MEDICATIONS:    sertraline (ZOLOFT) 100 MG tablet, Take 1 tablet by mouth daily Cancel refills on 50 mg., Disp: 30 tablet, Rfl: 0    clonazePAM (KLONOPIN) 2 MG tablet, Take 1 tablet by mouth nightly, Disp: 30 tablet, Rfl: 0    loratadine (CLARITIN) 10 MG tablet, Take 1 tablet by mouth daily, Disp: 30 tablet, Rfl: 2    fluticasone (FLOVENT HFA) 110 MCG/ACT inhaler, Inhale 1 puff into the lungs 2 (two) times daily  for 10 days, Disp: 1 Inhaler, Rfl: 0    chlorthalidone (HYGROTEN) 25 MG tablet, Take 1 tablet by mouth daily, Disp: 30 tablet, Rfl: 6    atenolol (TENORMIN) 50 MG tablet, Take 1 tablet by mouth daily, Disp: 30 tablet, Rfl: 6    acetaminophen (TYLENOL) 500 MG tablet, Take 1 tablet by mouth every 6 (six) hours as needed for Pain, Disp: 30 tablet, Rfl: 3    fluticasone (FLONASE) 50  MCG/ACT nasal spray, 1 spray by Each Nostril route daily, Disp: 1 Bottle, Rfl: 5    calcium-vitamin D (OSCAL-500) 500-400 MG-UNIT per tablet, Take 1 tablet by mouth 2 (two) times daily, Disp: 60 tablet, Rfl: 11      MEDICATION ADHERENCE (including barriers and how addressed): Yes.    MEDICATIONS SIDE EFFECTS: None    PAST MEDICATIONS:    Fluoxetine    BIRTH CONTROL (ask females and males): N/A    CURRENT PREGNANCY: N/A    AIMS: N/A     MENTAL STATUS EXAMINATION                General Appearance: Casually dressed, groomed   Interaction with Interviewer (eye contact, attitude, behavior): Yes.   Physical Signs   Gait and Station (how patient walks and stands): Within normal limits   Physical Appearance: Within normal limits  Normal Movements: Within normal limits   Speech (rate, volume, articulation): Very talkative  Language: Within normal limits, speaking in [place]  Mood: "Worried"   Affect: Congruent   Thought Process:  Logical   Thought Content:  Tangential   Perceptions: No hallucinations   Impulse Control: Good   Cognition (Link to [facility])   Orientation (person, place, time): Yes   Recent and remote memory: Intact   Attention span and concentration: Limited   Fund of knowledge, awareness of current events and vocabulary: Yes   Judgment: Yes   Insight: Limited       SAFETY EXAMINATION (if yes describe findings in detail)   Suicidality: No   Homicidality: No   Aggression toward others:No   Victim or at risk of aggression by others: No   Substance use/abuse (legal and illicit): No         Risk Level Assessment  Risk Level Change (if yes, please describe): No    Suicide: low (1)  Violence: low (1)  Addiction: low (1)        Protective factors: Family connections, faith.       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       CLINICAL FORMULATION   Sara CorwinMaria Mcleish is a 62 year old, married, [place] woman, mother of four children who was referred for psychopharm evaluation and treatment by her PCP.   She has a history of chronic anxiety with panic attacks and depression.   The client has no prior history of psychiatric hospitalizations, mania, psychosis, safety issues, violence or substance abuse issues.   Past medical history includes: Essential hypertension, labyrinthitis, hyperlipidemia and chronic cough  There is a strong familial predisposition to psychiatric problems since family history is positive for depression and anxiety.   Psychiatric symptoms started in [place] in 1996 in the context of work related stress. They recently exacerbated due to immigration related problems and medication  discontinuation. Pt has been in tx with Clonazepam and Sertraline since 2005. She ran out of Sertraline recently. Her PCP restarted it, but she was taking a subtherapeutic dose during the evaluation. It was increased from 50 to 100 mg during the evaluation since she endorsed symptoms of anxiety leading  to panic attacks and depression.   She was adamant about continuing with the same dose of Clonazepam. This will need to be monitored.   Psychotherapy tx continues to be recommended but she continues to refuse due to distance.     Patient's ethnic and cultural factors: Recent [place] immigrant, not able to speak [place].     Patient's religion and spiritual beliefs, values and preferences: Ephriam KnucklesChristian, believes in God. Listens to [employer] and goes to church.        TODAY'S ASSESSMENT:  Ongoing symptoms of depression and anxiety due to new stressors (husband's cancer diagnosis)       PLAN  PLAN FOR MANAGING RISK (Consider risk plan for patients at moderate or high risk for suicide/violence/addiction; medication plan; referrals, etc. Must coincide with treatment plan.):   Not at moderate or hight risk.      PLAN:   Continue Sertraline, 100 mg daily (pt does not want to increase the dose of Sertraline despite symptoms of anxiety.   Split dose of Clonazepam from 2 mg QHS to 1 mg BID for now.   Will continue to monitor appropriateness of Clonazepam.   Start Trazodone, 50 mg QHS for sleep.   Continue to suggest psychotherapy tx. Pt continues to decline.   F/U in four  weeks, call sooner if needed   Call 911 or proceed to the nearest ER in case symptoms worsen acutely or in the event of any other psychiatric emergency.  Collaborate with other providers as needed   Order complementary labs and tests as needed       INFORMED CONSENT (for any new treatment): Patient was informed of the potential risks and benefits of the treatment, including the option not to treat, and appeared to understand and agreed to comply.  Discussion included the following key points:    Reviewed benefits and side effects of Trazodone, along with Clonazepam and Sertraline.   Pt understands and consents fully     REVIEWING TODAY'S VISIT  CLINICAL INTERVENTIONS TODAY:   -Provided medication management-Reviewed medication adherence/tolerance.  -Performed mental status and risk assessments.   -Provided psychoeducation, support.       PATIENT'S RESPONSE TO INTERVENTIONS: Receptive     PROGRESS TOWARDS GOALS:  Slow progress     Estimated time spent in psychotherapy: N/A      FOR PRESCRIBERS and EVALUATION AND MANAGEMENT VISITS ONLY    Use only when no psychotherapy performed  COUNSELING AND COORDINATION OF CARE PROVIDED (Consider diagnostic results/impressions and/or recommended studies; risks and benefits of treatment options; instruction for management/treatment and/or follow-up; importance of compliance with chosen treatment options; risk factor reduction; patient/family/caregiver education; prognosis): Yes:       Over 50% of time during today's visit was devoted to counseling and/or coordination of care.  If yes, record estimated duration of the face to face encounter.    YES,  ESTIMATED TIME: 30 minutes     INSTRUCTIONS TO COVERING CLINICIANS:  OK to refill.    Noralyn PickEdilia Elke Holtry, APRN

## 2018-02-07 ENCOUNTER — Ambulatory Visit: Payer: No Typology Code available for payment source | Attending: Internal Medicine | Admitting: Internal Medicine

## 2018-02-07 ENCOUNTER — Encounter (HOSPITAL_BASED_OUTPATIENT_CLINIC_OR_DEPARTMENT_OTHER): Payer: Self-pay | Admitting: Internal Medicine

## 2018-02-07 ENCOUNTER — Encounter (HOSPITAL_BASED_OUTPATIENT_CLINIC_OR_DEPARTMENT_OTHER): Payer: Self-pay

## 2018-02-07 VITALS — BP 165/77 | HR 69 | Temp 97.8°F | Resp 18 | Ht 59.11 in | Wt 180.0 lb

## 2018-02-07 DIAGNOSIS — K029 Dental caries, unspecified: Secondary | ICD-10-CM | POA: Insufficient documentation

## 2018-02-07 DIAGNOSIS — R7303 Prediabetes: Secondary | ICD-10-CM | POA: Diagnosis present

## 2018-02-07 DIAGNOSIS — R05 Cough: Secondary | ICD-10-CM

## 2018-02-07 DIAGNOSIS — N183 Chronic kidney disease, stage 3 (moderate): Secondary | ICD-10-CM

## 2018-02-07 DIAGNOSIS — H8309 Labyrinthitis, unspecified ear: Secondary | ICD-10-CM

## 2018-02-07 DIAGNOSIS — K219 Gastro-esophageal reflux disease without esophagitis: Secondary | ICD-10-CM | POA: Insufficient documentation

## 2018-02-07 DIAGNOSIS — I10 Essential (primary) hypertension: Secondary | ICD-10-CM | POA: Diagnosis not present

## 2018-02-07 DIAGNOSIS — L659 Nonscarring hair loss, unspecified: Secondary | ICD-10-CM

## 2018-02-07 DIAGNOSIS — N1832 Chronic kidney disease, stage 3b: Secondary | ICD-10-CM

## 2018-02-07 DIAGNOSIS — E785 Hyperlipidemia, unspecified: Secondary | ICD-10-CM

## 2018-02-07 DIAGNOSIS — I83893 Varicose veins of bilateral lower extremities with other complications: Secondary | ICD-10-CM | POA: Insufficient documentation

## 2018-02-07 DIAGNOSIS — F410 Panic disorder [episodic paroxysmal anxiety] without agoraphobia: Secondary | ICD-10-CM | POA: Diagnosis present

## 2018-02-07 DIAGNOSIS — R053 Chronic cough: Secondary | ICD-10-CM

## 2018-02-07 LAB — BASIC METABOLIC PANEL
ANION GAP: 6 mmol/L (ref 5–15)
BUN (UREA NITROGEN): 24 mg/dL — ABNORMAL HIGH (ref 7–18)
CALCIUM: 8.7 mg/dL (ref 8.5–10.1)
CARBON DIOXIDE: 30 mmol/L (ref 21–32)
CHLORIDE: 104 mmol/L (ref 98–107)
CREATININE: 1.1 mg/dL (ref 0.4–1.2)
ESTIMATED GLOMERULAR FILT RATE: 50 mL/min — ABNORMAL LOW (ref >60)
Glucose Random: 117 mg/dL (ref 74–160)
POTASSIUM: 3.9 mmol/L (ref 3.5–5.1)
SODIUM: 140 mmol/L (ref 136–145)

## 2018-02-07 LAB — HEPATIC FUNCTION PANEL
ALANINE AMINOTRANSFERASE: 22 U/L (ref 12–45)
ALBUMIN: 3.6 g/dL (ref 3.4–5.0)
ALKALINE PHOSPHATASE: 102 U/L (ref 45–117)
ASPARTATE AMINOTRANSFERASE: 18 U/L (ref 8–34)
BILIRUBIN DIRECT: <0.1 mg/dl (ref 0.0–0.2)
BILIRUBIN TOTAL: 0.2 mg/dL (ref 0.2–1.0)
TOTAL PROTEIN: 7.1 g/dL (ref 6.4–8.2)

## 2018-02-07 LAB — THYROID SCREEN TSH REFLEX FT4: THYROID SCREEN TSH REFLEX FT4: 3.39 u[IU]/mL (ref 0.358–3.740)

## 2018-02-07 MED ORDER — RANITIDINE HCL 150 MG PO TABS: 150 mg | tablet | Freq: Every evening | ORAL | 2 refills | 0 days | Status: AC

## 2018-02-07 MED ORDER — LOSARTAN POTASSIUM 25 MG PO TABS: 25 mg | tablet | Freq: Every day | ORAL | 11 refills | 0 days | Status: AC

## 2018-02-07 MED ORDER — HAIR SKIN AND NAILS FORMULA PO TABS: 1 | tablet | Freq: Every day | 12 refills | 0 days | Status: AC

## 2018-02-07 MED ORDER — OMEPRAZOLE 20 MG PO CPDR: 20 mg | capsule | Freq: Every day | ORAL | 2 refills | 0 days | Status: DC

## 2018-02-07 MED ORDER — OMEPRAZOLE 20 MG PO CPDR
20.0000 mg | DELAYED_RELEASE_CAPSULE | Freq: Every day | ORAL | 2 refills | Status: DC
Start: 2018-02-07 — End: 2018-03-28

## 2018-02-07 MED ORDER — RANITIDINE HCL 150 MG PO TABS
150.0000 mg | ORAL_TABLET | Freq: Every evening | ORAL | 2 refills | Status: DC
Start: 2018-02-07 — End: 2018-09-13

## 2018-02-07 MED ORDER — LOSARTAN POTASSIUM 25 MG PO TABS
25.0000 mg | ORAL_TABLET | Freq: Every day | ORAL | 11 refills | Status: DC
Start: 2018-02-07 — End: 2018-05-23

## 2018-02-07 MED ORDER — HAIR SKIN AND NAILS FORMULA PO TABS
1.0000 | ORAL_TABLET | Freq: Every day | ORAL | 12 refills | Status: DC
Start: 2018-02-07 — End: 2018-09-13

## 2018-02-07 NOTE — Progress Notes (Signed)
HPI  Sara Snyder is a 62 year old female presenting for f/u    Hair loss  -hair has been very oily  -she has been experiencing hair loss in [place] as well, but it has been worse since she came here.     Diet  -rarely eats meat  -drinks coffee and eats high in fat cheese in the morning daily  -yogurt, chocolate  -[employer] chicken instant noodles    Leg swelling/varicose veins  Lower legs have been swelling and they feel heavy/sore  -varicose veins in both legs  -she does not like how varicose veins appear  -she sits on the computer for extended periods of time  -she denies being on her feet constantly     chronic cough  -her cough has been persistent for many months  -all medication has not helped (fluticasone, Flovent, albuterol, claritin). She used these for about 1 month 1 per day, but has since stopped.   -had chest XR 3 months ago  -cough began after coming to [name]  -cough is the same everyday but occurs more during the night  -exacerbated by consumption of food or cold beverages      Heart Burn  -has been occurring for about a month  -she has been using tums occasionally for management.     Labyrinthitis  -patient has trouble sleeping at night due to dizziness.    High BP  -takes lisinopril occasionally when bp is high (prescribed by different doctor/not on meds list). This has side-effect of coughing  -currently taking:   Atenolol 50mg   Chlorthalidone 25mg    Lisinopril on and off as needed, she used to be on captopril which was dicontinued and was placed on lisinopril , which she never revealed before   This could likely be causing her cough          Patient Active Problem List:     Essential hypertension     Labyrinthitis     Panic disorder     Hyperlipidemia     Chronic cough    Social History     Socioeconomic History    Marital status: Married     Spouse name: Not on file    Number of children: Not on file    Years of education: Not on file    Highest education level: Not on file   Occupational History     Not on file   Social Needs    Financial resource strain: Not on file    Food insecurity:     Worry: Not on file     Inability: Not on file    Transportation needs:     Medical: Not on file     Non-medical: Not on file   Tobacco Use    Smoking status: Former Smoker     Packs/day: 2.00     Years: 25.00     Pack years: 50.00     Last attempt to quit: 10/27/1977     Years since quitting: 40.3    Smokeless tobacco: Never Used   Substance and Sexual Activity    Alcohol use: Not Currently    Drug use: Never    Sexual activity: Not on file   Lifestyle    Physical activity:     Days per week: Not on file     Minutes per session: Not on file    Stress: Not on file   Relationships    Social connections:     Talks  on phone: Not on file     Gets together: Not on file     Attends religious service: Not on file     Active member of club or organization: Not on file     Attends meetings of clubs or organizations: Not on file     Relationship status: Not on file    Intimate partner violence:     Fear of current or ex partner: Not on file     Emotionally abused: Not on file     Physically abused: Not on file     Forced sexual activity: Not on file   Other Topics Concern    Not on file   Social History Narrative    Not on file     Past Surgical History:  10/2016: HEMORRHOID SURGERY  No date: OB ANTEPARTUM CARE CESAREAN DLVR & POSTPARTUM  No family history on file.      Current Outpatient Medications:  traZODone (DESYREL) 50 MG tablet Take 1 tablet by mouth nightly Disp: 30 tablet Rfl: 2   clonazePAM (KLONOPIN) 1 MG tablet Take 1 tablet by mouth 2 (two) times daily Disp: 60 tablet Rfl: 2   sertraline (ZOLOFT) 100 MG tablet Take 1 tablet by mouth daily Disp: 30 tablet Rfl: 2   sertraline (ZOLOFT) 50 MG tablet Take 1 tablet by mouth daily Disp: 30 tablet Rfl: 3   loratadine (CLARITIN) 10 MG tablet Take 1 tablet by mouth daily Disp: 30 tablet Rfl: 2   fluticasone (FLOVENT HFA) 110 MCG/ACT inhaler Inhale 1 puff into the  lungs 2 (two) times daily  for 10 days Disp: 1 Inhaler Rfl: 0   chlorthalidone (HYGROTEN) 25 MG tablet Take 1 tablet by mouth daily Disp: 30 tablet Rfl: 6   atenolol (TENORMIN) 50 MG tablet Take 1 tablet by mouth daily Disp: 30 tablet Rfl: 6   acetaminophen (TYLENOL) 500 MG tablet Take 1 tablet by mouth every 6 (six) hours as needed for Pain Disp: 30 tablet Rfl: 3   fluticasone (FLONASE) 50 MCG/ACT nasal spray 1 spray by Each Nostril route daily Disp: 1 Bottle Rfl: 5   calcium-vitamin D (OSCAL-500) 500-400 MG-UNIT per tablet Take 1 tablet by mouth 2 (two) times daily Disp: 60 tablet Rfl: 11     No current facility-administered medications for this visit.   Review of Patient's Allergies indicates:   Pollen extract          Cough  Review of Systems   Constitution: Negative.   HENT: Negative.    Eyes: Negative.    Cardiovascular: Positive for leg swelling.   Respiratory: Positive for cough.    Endocrine: Negative.    Hematologic/Lymphatic: Negative.    Skin: Negative.    Musculoskeletal: Negative.    Gastrointestinal: Negative.    Genitourinary: Negative.    Neurological: Negative.    Psychiatric/Behavioral: The patient has insomnia and is nervous/anxious.    Allergic/Immunologic: Negative.        BP (!) 165/77 (Site: LA, Position: Sitting, Cuff Size: Lrg)  Pulse 69  Temp 97.8 F (36.6 C) (Temporal)  Resp 18  Ht 4' 11.11" (1.501 m)  Wt 81.6 kg (180 lb)  SpO2 98%  BMI 36.22 kg/m2     Physical Exam   Pulmonary/Chest: Breath sounds normal.   Constitutional: She is oriented to person, place, and time and well-developed, well-nourished, and in no distress.   Neurological: She is alert and oriented to person, place, and time. Gait normal.   Skin: Skin is  warm and dry.   Psychiatric: Mood, memory, affect and judgment normal.   Nursing note and vitals reviewed.    Assessment /Plan       (R05) Chronic cough  (primary encounter diagnosis)  Comment: patient has been taking lisinopril which has a side-effect of dry cough.   No  family h/o TB.   Plan: d/c lisinopril ,   Will start Losartan 25mg  po daily     FULL PULMONARY FUNCTION TEST to r/o lung pathology : CXR done at emergency room ; scanned in the system negative  QUANTIFERON-TB GOLD PLUS    If symptoms worsen or do not improve, pt should f/u with PCP for further assessment and treatment.   Follow up in one month     (I10) Essential hypertension  Comment: Patient was prescribed lisinopril from another doctor. She has been taking this as needed. Side-effect of lisinopril is coughing. Change lisinopril medication to losartan.  Plan: losartan (COZAAR) 25 MG tablet in place of lisinoprl, BASIC METABOLIC PANEL  -[id number]$clorthalodrone12.5mg  AM ( taking half of 25mg  )   -losartan 25mg  AM   -Atenolol 50mg  PM    Discussed medication regime for HTN and that pt needs to take these medications daily not as needed.     - Counseled at length on reduced calorie and reduced carbohydrate food plan  -Advised reducing meat intake and high sodium foods (instant noodles)  -consume chicken, fish instead  -more vegetables and fruits  - Counseling time: 15 minutes    (E78.5) Hyperlipidemia, unspecified hyperlipidemia type  Comment:   Cholesterol (mg/dL)   Date Value   70/62/376205/01/2018 204     LOW DENSITY LIPOPROTEIN DIRECT (mg/dL)   Date Value   83/15/176105/01/2018 144     HIGH DENSITY LIPOPROTEIN (mg/dL)   Date Value   60/73/710605/01/2018 35 (L)     TRIGLYCERIDES (mg/dL)   Date Value   26/94/854605/01/2018 243 (H)     Plan: continue medication    (H83.09) Labyrinthitis, unspecified laterality  Comment: Pt is adhering to medication regimen  Plan: continue medication regime, meclizine    (F41.0) Panic disorder  Comment: Pt has chronic anxiety w/ panic disorder. She Takes sertraline 50 mg BID and clonazepam 2 mg nightly, this is working well for her  Plan: sertraline (ZOLOFT) 50 MG tablet  Pt is also on clonazepam 2mg      She has not been to her psych appointments for management.       (L65.9) Hair loss  Comment: pt reports increased hair loss.,  unclear etiology ,   Plan: THYROID SCREEN TSH REFLEX FT4  Prescribed Multiple Vitamins-Minerals (HAIR SKIN AND NAILS FORMULA) TABS      (E70.350(I83.893) Varicose veins of leg with swelling, bilateral  Comment: patient complains of bilateral leg swelling/soreness with varicose veins. She is irritated by the appearance as well.     Plan:   Advised patient talk walks daily and avoid prolonged sitting or standing   Will discuss about stocking at the next visit     (N18.3) Chronic kidney disease (CKD) stage G3b/A1, moderately decreased glomerular filtration rate (GFR) between 30-44 mL/min/1.73 square meter and albuminuria creatinine ratio less than 30 mg/g (HCC)  Comment:   CREATININE (mg/dL)   Date Value   09/38/182905/01/2018 1.3 (H)     Plan: will check BMP   Unclear etiology : urine : no microalbuminuria   Check renal US     (R73.03) Prediabetes  Comment: repeat A1C  HEMOGLOBIN A1C (%)   Date Value  10/27/2017 5.7 (H)     Plan: HEMOGLOBIN A1C, HEPATIC FUNCTION PANEL    (K02.9) Dental caries  Comment: pt needs referral to dentist  Plan: REFERRAL TO DENTAL ( INT)    (K21.9) Chronic GERD  Comment: Reflux could be contributing to chronic cough.   Plan: omeprazole (PRILOSEC) 20 MG capsule 1 pill in AM  raNITIdine (ZANTAC) 150 MG tablet 1 pill in PM    If cough symptoms worsen or do not improve with new medication changes, pt should f/u with PCP for further assessment and treatment.     Scribe Attestation- Chart written by Camelia EngSuhina Srivastav, 02/07/2018     Written in the presence of and acting as a scribe for Overton MamAparna Aspasia Rude, MD     I have spent 40 minutes in face to face time with this patient/patient proxy of which > 50% was in counseling or coordination of care regarding above issues/Dx.      Attending to Scribe-Chart documented by scribe in my presence. Documentation accepted as noted in record. Work performed by me and documented by scribe. Any changes or disagreements are noted in record Overton MamAparna Jefry Lesinski, MD,  02/08/2018    We discussed the importance of medication compliance. The patient was ready to learn and no apparent learning barriers were identified. I explained the diagnosis and treatment plan, and the patient expressed understanding of the content. Possible side effects of the prescribed medication(s) were explained. I attempted to answer any questions regarding the diagnosis and the proposed treatment.    We discussed the patients current medications. The patient expressed understanding and no barriers to adherence were identified.

## 2018-02-07 NOTE — Progress Notes (Signed)
Pt feels safe at home.  Deema Juncaj, [place], 02/07/2018

## 2018-02-08 ENCOUNTER — Encounter (HOSPITAL_BASED_OUTPATIENT_CLINIC_OR_DEPARTMENT_OTHER): Payer: Self-pay | Admitting: Internal Medicine

## 2018-02-09 LAB — HEMOGLOBIN A1C
ESTIMATED AVERAGE GLUCOSE: 120 (ref 74–160)
HEMOGLOBIN A1C: 5.8 % — ABNORMAL HIGH (ref 4.0–5.6)

## 2018-02-13 LAB — QUANTIFERON-TB GOLD PLUS
QFT PLUS INTERPRETATION: POSITIVE — AB
QFT PLUS MITOGEN MINUS NIL: 9.537 IU/mL
QFT PLUS NIL VALUE: 0.432 IU/mL
QFT PLUS TB AG1 MINUS NIL: 2.378 IU/mL
QFT PLUS TB AG2 MINUS NIL: 2.059 IU/mL

## 2018-02-15 ENCOUNTER — Encounter (HOSPITAL_BASED_OUTPATIENT_CLINIC_OR_DEPARTMENT_OTHER): Payer: Self-pay | Admitting: Internal Medicine

## 2018-02-16 ENCOUNTER — Encounter (HOSPITAL_BASED_OUTPATIENT_CLINIC_OR_DEPARTMENT_OTHER): Payer: Self-pay

## 2018-02-22 ENCOUNTER — Encounter (HOSPITAL_BASED_OUTPATIENT_CLINIC_OR_DEPARTMENT_OTHER): Payer: Self-pay | Admitting: Internal Medicine

## 2018-02-28 ENCOUNTER — Other Ambulatory Visit (HOSPITAL_BASED_OUTPATIENT_CLINIC_OR_DEPARTMENT_OTHER): Payer: Self-pay | Admitting: Internal Medicine

## 2018-02-28 DIAGNOSIS — F410 Panic disorder [episodic paroxysmal anxiety] without agoraphobia: Secondary | ICD-10-CM

## 2018-02-28 DIAGNOSIS — F419 Anxiety disorder, unspecified: Secondary | ICD-10-CM

## 2018-03-02 ENCOUNTER — Encounter (HOSPITAL_BASED_OUTPATIENT_CLINIC_OR_DEPARTMENT_OTHER): Payer: Self-pay

## 2018-03-03 ENCOUNTER — Encounter (HOSPITAL_BASED_OUTPATIENT_CLINIC_OR_DEPARTMENT_OTHER): Payer: Self-pay

## 2018-03-21 ENCOUNTER — Ambulatory Visit: Payer: No Typology Code available for payment source | Attending: Internal Medicine

## 2018-03-21 VITALS — HR 66

## 2018-03-21 DIAGNOSIS — R05 Cough: Secondary | ICD-10-CM | POA: Diagnosis not present

## 2018-03-21 DIAGNOSIS — R053 Chronic cough: Secondary | ICD-10-CM

## 2018-03-21 MED ORDER — ALBUTEROL SULFATE HFA 108 (90 BASE) MCG/ACT IN AERS
4.0000 | INHALATION_SPRAY | Freq: Once | RESPIRATORY_TRACT | Status: AC | PRN
Start: 2018-03-21 — End: 2018-03-21
  Administered 2018-03-21: 4 via RESPIRATORY_TRACT
  Filled 2018-03-21: qty 8

## 2018-03-24 ENCOUNTER — Encounter (HOSPITAL_BASED_OUTPATIENT_CLINIC_OR_DEPARTMENT_OTHER): Payer: Self-pay | Admitting: Internal Medicine

## 2018-03-24 DIAGNOSIS — K219 Gastro-esophageal reflux disease without esophagitis: Secondary | ICD-10-CM

## 2018-03-24 DIAGNOSIS — I10 Essential (primary) hypertension: Secondary | ICD-10-CM

## 2018-03-28 ENCOUNTER — Ambulatory Visit: Payer: No Typology Code available for payment source | Attending: Physician Assistant

## 2018-03-28 ENCOUNTER — Telehealth (HOSPITAL_BASED_OUTPATIENT_CLINIC_OR_DEPARTMENT_OTHER): Payer: Self-pay

## 2018-03-28 DIAGNOSIS — F330 Major depressive disorder, recurrent, mild: Secondary | ICD-10-CM | POA: Diagnosis not present

## 2018-03-28 DIAGNOSIS — F410 Panic disorder [episodic paroxysmal anxiety] without agoraphobia: Secondary | ICD-10-CM

## 2018-03-28 MED ORDER — CLONAZEPAM 1 MG PO TABS: 1 mg | tablet | Freq: Two times a day (BID) | ORAL | 2 refills | 0 days | Status: AC

## 2018-03-28 MED ORDER — TRAZODONE HCL 50 MG PO TABS: 50 mg | tablet | Freq: Every evening | ORAL | 2 refills | 0 days | Status: AC

## 2018-03-28 MED ORDER — SERTRALINE HCL 100 MG PO TABS
100.0000 mg | ORAL_TABLET | Freq: Every day | ORAL | 2 refills | Status: DC
Start: 2018-03-28 — End: 2018-06-14

## 2018-03-28 MED ORDER — CLONAZEPAM 1 MG PO TABS
1.0000 mg | ORAL_TABLET | Freq: Two times a day (BID) | ORAL | 2 refills | Status: DC
Start: 2018-03-28 — End: 2018-06-14

## 2018-03-28 MED ORDER — TRAZODONE HCL 50 MG PO TABS
50.0000 mg | ORAL_TABLET | Freq: Every evening | ORAL | 2 refills | Status: DC
Start: 2018-03-28 — End: 2018-06-14

## 2018-03-28 MED ORDER — SERTRALINE HCL 100 MG PO TABS: 100 mg | tablet | Freq: Every day | ORAL | 2 refills | 0 days | Status: AC

## 2018-03-28 NOTE — Progress Notes (Signed)
OUTPATIENT PSYCHIATRY PROGRESS NOTE      INTERPRETER: None needed.  INTERPRETER NAME: N/A      CONTACT INFO FOR OTHER AGENCIES AND MENTAL HEALTH PROVIDERS:   PCP: Overton Mam, MD      FOCUS OF CURRENT SESSION:  Problem 1: Depression   Problem 2: Anxiety        SUBJECTIVE:  TODAY'S CHIEF COMPLAINT AND CLINICAL UPDATES IN PATIENT'S WORDS:  1) Chief Complaint:  "I am feeling sad"       2) new information from patient and/or collateral:  This is our third visit. Patient is accompanied by her husband. She gives permission for her husband to participate fully in the session.   Pt had difficulties focusing on her issues. Similar to our last session, she wanted to discuss her husband's problems.   She and her husband often interrupted each other. Her husband started to discuss his own problems. He requested refills on his medication for blood pressure.   They had difficulties ending the session on time.   They needed a lot of redirection.     During our last session, patient reported feeling anxious due to her husband's diagnosis of kidney cancer.   She is now relieved because her husband had surgery to have one kidney removed. The surgery was successful and her husband is ready to return to work.   Pt was not sleeping well. She was prescribed Trazodone during our last visit. Her sleep has improved.   Pt reported feeling sad due to ongoing stressors.   She is struggling with financial difficulties. She has been depending on her daughters financially since her husband had to stop working recently because of the tx for cancer.   Additionally, pt would like to have her own apartment. She wonders if she is eligible for public housing.     Patient was feeling anxious with daily panic attacks. She was taking 2 mg of Clonazepam at night.   The dose of Clonazepam was changed from 2 mg at bedtime, to 1 mg at bedtime and 1 mg as needed daily for panic attacks.   She continues to experience anxiety, but she is not having  panic attacks.     She is worried about her blood pressure, which has been elevated. She was not able to pick up her prescription for Losartan.   She has not taken it for many days. She checked her BP earlier today and it was elevated. She is worried about having a stroke. She felt better after taking Clonazepam.   She is also worried about her husband's blood pressure. He is also without his prescription for Losartan.   Pt and husband have been picking up medications at three different pharmacies, therefore the confusion.   I encouraged her to keep one pharmacy only to avoid confusion.   They prefer to pick up the medications at the [facility] where they are covered by their insurance.   Due to distance, sometimes their daughters prefer to pay for the medications out of pocket at the [facility] pharmacy in [place].     She denied SI, HI, psychosis, substance abuse problems.     She is not able to come for psychotherapy treatment.       OBJECTIVE:  DATA REVIEWED: Chart        CURRENT MEDICATIONS:    omeprazole (PRILOSEC) 20 MG capsule, Take 1 capsule by mouth daily, Disp: 30 capsule, Rfl: 2    raNITIdine (ZANTAC) 150 MG tablet, Take  1 tablet by mouth nightly, Disp: 30 tablet, Rfl: 2    losartan (COZAAR) 25 MG tablet, Take 1 tablet by mouth daily, Disp: 30 tablet, Rfl: 11    Multiple Vitamins-Minerals (HAIR SKIN AND NAILS FORMULA) TABS, Take 1 capsule by mouth daily, Disp: 30 tablet, Rfl: 12    traZODone (DESYREL) 50 MG tablet, Take 1 tablet by mouth nightly, Disp: 30 tablet, Rfl: 2    clonazePAM (KLONOPIN) 1 MG tablet, Take 1 tablet by mouth 2 (two) times daily, Disp: 60 tablet, Rfl: 2    sertraline (ZOLOFT) 100 MG tablet, Take 1 tablet by mouth daily, Disp: 30 tablet, Rfl: 2    loratadine (CLARITIN) 10 MG tablet, Take 1 tablet by mouth daily, Disp: 30 tablet, Rfl: 2    fluticasone (FLOVENT HFA) 110 MCG/ACT inhaler, Inhale 1 puff into the lungs 2 (two) times daily  for 10 days, Disp: 1 Inhaler, Rfl:  0    chlorthalidone (HYGROTEN) 25 MG tablet, Take 1 tablet by mouth daily, Disp: 30 tablet, Rfl: 6    atenolol (TENORMIN) 50 MG tablet, Take 1 tablet by mouth daily, Disp: 30 tablet, Rfl: 6    acetaminophen (TYLENOL) 500 MG tablet, Take 1 tablet by mouth every 6 (six) hours as needed for Pain, Disp: 30 tablet, Rfl: 3    fluticasone (FLONASE) 50 MCG/ACT nasal spray, 1 spray by Each Nostril route daily, Disp: 1 Bottle, Rfl: 5    calcium-vitamin D (OSCAL-500) 500-400 MG-UNIT per tablet, Take 1 tablet by mouth 2 (two) times daily, Disp: 60 tablet, Rfl: 11        MEDICATION ADHERENCE (including barriers and how addressed): Yes.    MEDICATIONS SIDE EFFECTS: None    PAST MEDICATIONS:    Fluoxetine    BIRTH CONTROL (ask females and males): N/A    CURRENT PREGNANCY: N/A    AIMS: N/A     MENTAL STATUS EXAMINATION                General Appearance: Casually dressed, groomed   Interaction with Interviewer (eye contact, attitude, behavior): Yes.   Physical Signs   Gait and Station (how patient walks and stands): Within normal limits   Physical Appearance: Within normal limits  Normal Movements: Within normal limits   Speech (rate, volume, articulation): Very talkative  Language: Within normal limits, speaking in [place]  Mood: "Sad"   Affect: Congruent   Thought Process: Logical   Thought Content:  Tangential   Perceptions: No hallucinations   Impulse Control: Good   Cognition (Link to [facility])   Orientation (person, place, time): Yes   Recent and remote memory: Intact   Attention span and concentration: Limited   Fund of knowledge, awareness of current events and vocabulary: Yes   Judgment: Yes   Insight: Limited       SAFETY EXAMINATION (if yes describe findings in detail)   Suicidality: No   Homicidality: No   Aggression toward others:No   Victim or at risk of aggression by others: No   Substance use/abuse (legal and illicit): No         Risk Level Assessment  Risk Level Change (if yes, please describe): No    Suicide:  low (1)  Violence: low (1)  Addiction: low (1)        Protective factors: Family connections, faith.       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       CLINICAL FORMULATION / TODAY'S ASSESSMENT:  Sara Snyder is a 62 year old, married, [name], mother of four children who was referred for psychopharm evaluation and treatment by her PCP.   She has a history of chronic anxiety with panic attacks and depression.   The client has no prior history of psychiatric hospitalizations, mania, psychosis, safety issues, violence or substance abuse issues.   Past medical history includes: Essential hypertension, labyrinthitis, hyperlipidemia and chronic cough  There is a strong familial predisposition to psychiatric problems since family history is positive for depression and anxiety.   Psychiatric symptoms started in [place] in 1996 in the context of work related stress. They recently exacerbated due to immigration related problems and medication discontinuation. Pt has been in tx with Clonazepam and Sertraline since 2005. She ran out of Sertraline recently. Her PCP restarted it, but she was taking a subtherapeutic dose during the evaluation. It was increased from 50 to 100 mg during the evaluation since she endorsed symptoms of anxiety leading to panic attacks and depression.   She has been adamant about continuing with the same dose of Clonazepam.   She has benefited from splitting the dose from 2 mg QHS to 1 mg BID. She continues to feel anxious but she is no longer having panic attacks.   Trazodone was added during our last visit and it has helped with insomnia.   She has been  feeling sad, but she denied SI and hopelessness. She does not want to increase the dose of Sertraline.   She is struggling with multiple stressors.   She is worried about her BP due to not having her RX for Losartan.     Psychotherapy tx continues to be recommended but she continues to refuse due to distance.     Patient's ethnic and cultural factors: Recent [place] immigrant, not able to speak [place].     Patient's religion and spiritual beliefs, values and preferences: Ephriam Knuckles, believes in God. Listens to [employer] and goes to church.        PLAN  PLAN FOR MANAGING RISK (Consider risk plan for patients at moderate or high risk for suicide/violence/addiction; medication plan; referrals, etc. Must coincide with treatment plan.):   Not at moderate or hight risk.      PLAN:   Continue Sertraline, 100 mg daily (pt does not want to increase the dose of Sertraline despite symptoms of anxiety and depression)   Continue Clonazepam, 1 mg BID for now.  Will continue to monitor appropriateness of Clonazepam.   Continue Trazodone, 50 mg QHS for sleep.   Continue to suggest psychotherapy tx. Pt continues to decline.   F/U in 12 weeks, call sooner if needed   Call 911 or proceed to the nearest ER in case symptoms worsen acutely or in the event of any other psychiatric emergency.  Collaborate with other providers as needed   Order complementary labs and tests as needed   Pt may benefit from the support of Bobbe Medico, mental health care partner or Melvenia Needles, Patient Resources Coordinator.       INFORMED CONSENT (for any new treatment): Patient was informed of the potential risks and benefits of the treatment, including the option  not to treat, and appeared to understand and agreed to comply. Discussion included the following key points:   Pt understands the benefits and side effects of Trazodone Clonazepam and Sertraline.   Pt understands and consents fully       REVIEWING TODAY'S VISIT  CLINICAL  INTERVENTIONS TODAY:   -Provided medication management-Reviewed medication adherence/tolerance.  -Performed mental status and risk assessments.   -Provided psychoeducation, support.       PATIENT'S RESPONSE TO INTERVENTIONS: Receptive     PROGRESS TOWARDS GOALS:  Slow progress     Estimated time spent in psychotherapy: N/A      FOR PRESCRIBERS and EVALUATION AND MANAGEMENT VISITS ONLY    Use only when no psychotherapy performed  COUNSELING AND COORDINATION OF CARE PROVIDED (Consider diagnostic results/impressions and/or recommended studies; risks and benefits of treatment options; instruction for management/treatment and/or follow-up; importance of compliance with chosen treatment options; risk factor reduction; patient/family/caregiver education; prognosis): Yes:       Over 50% of time during today's visit was devoted to counseling and/or coordination of care.  If yes, record estimated duration of the face to face encounter.    YES,  ESTIMATED TIME: 40 minutes     INSTRUCTIONS TO COVERING CLINICIANS:  OK to refill.    [name], APRN

## 2018-03-28 NOTE — Telephone Encounter (Signed)
-----   Message from Daniel Nones sent at 03/28/2018 10:11 AM EDT -----  Regarding: Rxr-   Contact: [phone]  Ersie Savino [id number], 62 year old, female    Calls today:  Refill    !! Before starting refill request, check EPIC to see if encounter for this medication already exists !!    (May list multiple medications in this section)  Medicine Name:   1. chlorthalidone (HYGROTEN) 25 MG tablet   2. sertraline (ZOLOFT) 100 MG tablet   Dosage  Frequency (how many pills, how many times a day):   Number of pills left:     Documented patient preferred pharmacies:    [facility], [place], [place] - [address]  Phone: [phone] Fax: [phone]    Person calling on behalf of patient: Daughter    CALL BACK NUMBER:     Best time to call back:   Cell phone:   Other phone:    Patient's language of care: [place] ([place])    Patient needs a [place] interpreter.    Patient's PCP: Overton Mam, MD

## 2018-03-28 NOTE — Progress Notes (Unsigned)
[facility] will transfer in medication.

## 2018-03-29 ENCOUNTER — Ambulatory Visit: Payer: No Typology Code available for payment source | Attending: Internal Medicine | Admitting: Ophthalmology

## 2018-03-29 ENCOUNTER — Encounter (HOSPITAL_BASED_OUTPATIENT_CLINIC_OR_DEPARTMENT_OTHER): Payer: Self-pay | Admitting: Ophthalmology

## 2018-03-29 ENCOUNTER — Other Ambulatory Visit (HOSPITAL_BASED_OUTPATIENT_CLINIC_OR_DEPARTMENT_OTHER): Payer: Self-pay | Admitting: Ophthalmology

## 2018-03-29 DIAGNOSIS — H52203 Unspecified astigmatism, bilateral: Secondary | ICD-10-CM | POA: Diagnosis not present

## 2018-03-29 DIAGNOSIS — H3500 Unspecified background retinopathy: Secondary | ICD-10-CM

## 2018-03-29 DIAGNOSIS — H40003 Preglaucoma, unspecified, bilateral: Secondary | ICD-10-CM | POA: Diagnosis not present

## 2018-03-29 DIAGNOSIS — H524 Presbyopia: Secondary | ICD-10-CM | POA: Diagnosis not present

## 2018-03-29 DIAGNOSIS — H5203 Hypermetropia, bilateral: Secondary | ICD-10-CM | POA: Diagnosis not present

## 2018-03-29 MED ORDER — OMEPRAZOLE 20 MG PO CPDR: 20 mg | capsule | Freq: Every day | ORAL | 2 refills | 0 days | Status: AC

## 2018-03-29 MED ORDER — CHLORTHALIDONE 25 MG PO TABS: 25 mg | tablet | Freq: Every day | ORAL | 6 refills | 0 days | Status: AC

## 2018-03-29 MED ORDER — CHLORTHALIDONE 25 MG PO TABS
25.0000 mg | ORAL_TABLET | Freq: Every day | ORAL | 6 refills | Status: DC
Start: 2018-03-29 — End: 2018-05-23

## 2018-03-29 MED ORDER — OMEPRAZOLE 20 MG PO CPDR
20.0000 mg | DELAYED_RELEASE_CAPSULE | Freq: Every day | ORAL | 2 refills | Status: DC
Start: 2018-03-29 — End: 2018-09-13

## 2018-03-29 MED FILL — OMEPRAZOLE   20MG: 30 days supply | Qty: 30 | Fill #0

## 2018-03-29 NOTE — Procedures (Signed)
Flow volume loops are normal, and unchanged with bronchodilator.  FEV1 is 2.02 L (98%   predicted), FVC is 2.53 L (97% predicted), and FEV1 to FVC ratio is normal at 80%.  Lung volumes are all within normal limits, with the exception of a reduced expiratory   reserve volume.  Diffusion capacity is calculated as being within normal limits (55%   of predicted, and when adjusted for alveolar volume, 114% predicted).  O2 saturation ranges from 96 to 98% with exertion.  Impression: No obstructive or restrictive dysfunction.  Diffusion capacity and   exercise oximetry are within normal limits.  Recommendation: If asthma is a concern, patient may be referred for methacholine   challenge testing.

## 2018-03-29 NOTE — Progress Notes (Signed)
62 years old female,    New patient.    C/O Blurry vision OU at distance and near. Was told in [place] that she has retinal issues. No h/o trauma or surgery. No pain. No flashes or floaters.

## 2018-03-29 NOTE — Progress Notes (Signed)
For evaluation of blurred vision.    She was told in [place] that she has a problem with her retina 5 years ago.      She did not have any records with her.      Impression,    1."Retina Problem"-no obvious retinal pathology    Macula OCT wnl OD, poor images OS but likely wnl    She stated that she had a fixed spot in her vision  5 years ago that resolved    Fundus photos-wnl    She will have HVF to further evaluate her vision    2.Hyperopia, astigmatism, and presbyopia-she was refracted and given a prescription to get glasses that she can fill at any optical shop.    3. Glaucoma suspect-based on somewhat generous C/D    IOP was wnl (19 OD and 17 OS.)    Disc OCT today revealed an average retinal NFL thickness of 77 microns OD with a C/D of 0.70 and 65 microns OD with a C/D of 0.76    She will have a HVF to further evaluate for glaucoma    RTC 4-6 months to re check IOP and review HVF

## 2018-04-27 MED FILL — CLONAZEPAM 1MG: 30 days supply | Qty: 60 | Fill #1 | Status: CP

## 2018-04-27 MED FILL — SERTRALINE 100MG: 30 days supply | Qty: 30 | Fill #1 | Status: CP

## 2018-04-27 MED FILL — ATENOLOL 50MG: 30 days supply | Qty: 30 | Fill #2 | Status: CP

## 2018-04-27 MED FILL — LOSARTAN POT 25MG: 30 days supply | Qty: 30 | Fill #2 | Status: CP

## 2018-04-27 MED FILL — *CHLORTHALID 25MG: 30 days supply | Qty: 30 | Fill #2 | Status: CP

## 2018-04-27 MED FILL — TRAZODONE  50MG: 30 days supply | Qty: 30 | Fill #1 | Status: CP

## 2018-05-23 ENCOUNTER — Encounter (HOSPITAL_BASED_OUTPATIENT_CLINIC_OR_DEPARTMENT_OTHER): Payer: Self-pay | Admitting: Internal Medicine

## 2018-05-23 ENCOUNTER — Telehealth (HOSPITAL_BASED_OUTPATIENT_CLINIC_OR_DEPARTMENT_OTHER): Payer: Self-pay

## 2018-05-23 ENCOUNTER — Ambulatory Visit: Payer: No Typology Code available for payment source | Attending: Internal Medicine | Admitting: Internal Medicine

## 2018-05-23 VITALS — BP 154/71 | HR 64 | Temp 98.0°F | Resp 18 | Ht 59.11 in | Wt 178.0 lb

## 2018-05-23 DIAGNOSIS — I10 Essential (primary) hypertension: Secondary | ICD-10-CM

## 2018-05-23 DIAGNOSIS — M79602 Pain in left arm: Secondary | ICD-10-CM

## 2018-05-23 DIAGNOSIS — Z23 Encounter for immunization: Secondary | ICD-10-CM | POA: Diagnosis present

## 2018-05-23 DIAGNOSIS — F330 Major depressive disorder, recurrent, mild: Secondary | ICD-10-CM

## 2018-05-23 DIAGNOSIS — J309 Allergic rhinitis, unspecified: Secondary | ICD-10-CM | POA: Diagnosis not present

## 2018-05-23 DIAGNOSIS — F439 Reaction to severe stress, unspecified: Secondary | ICD-10-CM | POA: Diagnosis present

## 2018-05-23 DIAGNOSIS — R7612 Nonspecific reaction to cell mediated immunity measurement of gamma interferon antigen response without active tuberculosis: Secondary | ICD-10-CM | POA: Diagnosis not present

## 2018-05-23 DIAGNOSIS — G8929 Other chronic pain: Secondary | ICD-10-CM | POA: Insufficient documentation

## 2018-05-23 DIAGNOSIS — M79601 Pain in right arm: Secondary | ICD-10-CM | POA: Insufficient documentation

## 2018-05-23 MED ORDER — AZELASTINE HCL 0.1 % NA SOLN
1.0000 | Freq: Two times a day (BID) | NASAL | 4 refills | Status: DC
Start: 2018-05-23 — End: 2018-09-13

## 2018-05-23 MED ORDER — LOSARTAN POTASSIUM 25 MG PO TABS: tablet | ORAL | 11 refills | 0 days | Status: AC

## 2018-05-23 MED ORDER — LOSARTAN POTASSIUM 25 MG PO TABS
ORAL_TABLET | ORAL | 11 refills | Status: DC
Start: 2018-05-23 — End: 2018-06-07

## 2018-05-23 MED ORDER — AZELASTINE HCL 0.1 % NA SOLN: 1 | mL | Freq: Two times a day (BID) | NASAL | 4 refills | 0 days | Status: AC

## 2018-05-23 MED FILL — ATENOLOL 50MG: 30 days supply | Qty: 30 | Fill #3 | Status: CP

## 2018-05-23 MED FILL — TRAZODONE  50MG: 30 days supply | Qty: 30 | Fill #2 | Status: CP

## 2018-05-23 MED FILL — SERTRALINE 100MG: 30 days supply | Qty: 30 | Fill #2 | Status: CP

## 2018-05-23 MED FILL — LOSARTAN POT 25MG: 30 days supply | Qty: 90 | Fill #0 | Status: CP

## 2018-05-23 MED FILL — *CHLORTHALID 25MG: 30 days supply | Qty: 30 | Fill #3 | Status: CP

## 2018-05-23 NOTE — Progress Notes (Signed)
Per pcp request, this Patient Resources Coordinator [facility]) called pt to discuss alternatives for employment, and also to inform that husband can certainly get prescribed medications at [facility]. PRC believes that husband is also covered by [employer] plus [employer] as pt.     According to pcp, during today's visit pt complained about their financial hardship due to unemployment and the expensive medications they have to buy at [facility] for husband's cancer treatment.     Pt wasn't available. PRC left a voice-message explaining the reason for her call and asked pt to call back. PRC will try again to reach pt.

## 2018-05-23 NOTE — Progress Notes (Signed)
Pt feels safe at home.  Jessilyn Catino, [place], 05/23/2018

## 2018-05-23 NOTE — Progress Notes (Signed)
Influenza Vaccine Procedure  May 23, 2018    1. Has the patient received the information for the influenza vaccine? Yes    2. Does the patient have any of the following contraindications?  Allergy to eggs? No  Allergic reaction to previous influenza vaccines? No  Any other problems to previous influenza vaccines? No  Paralyzed by Guillain-Barre syndrome?  No  Current moderate or severe illness? No  Allergy to contact lens solution? No    3. The vaccine has been administered in the usual fashion.     Immunization information reviewed. Current VIS reviewed and given to patient/ guardian. Verbal assent obtained from patient/ guardian.  See immunization/Injection module or chart review for date of publication and additional information. Verbal assent obtained from patient/guardian. Comfort measures for possible side effects reviewed.   Sara Recardo Linn LPN

## 2018-05-23 NOTE — Progress Notes (Signed)
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   Sees some clots sometimes from the nose  Runny nose   This is new, she had cough which got better     HTN : not well controlled   Pt says her BP usually raised and she takes extra losartan  Pt was crying during this interview and she says her husband was diagnosed with cancer and she came to [name] for better life but she is struggling to pay bills   Pt says she is very anxious and she is unable to work   She says they are 6 people in the house and unable to pay bills     TB quantiferon test +  Pt was relayed that her test is positive   Pulmonary function test done for chronic cough : normal : cough has now improved  When asked she said she doesn't know anyone with TB   She may have contacted in a hospital in [place] or could be positive due to bcg     Pt is also very anxious and taking meds [facility]           Social History     Socioeconomic History    Marital status: Married     Spouse name: Not on file    Number of children: Not on file    Years of education: Not on file    Highest education level: Not on file   Occupational History    Not on file   Social Needs    Financial resource strain: Not on file    Food insecurity:     Worry: Not on file     Inability: Not on file    Transportation needs:     Medical: Not on file     Non-medical: Not on file   Tobacco Use    Smoking status: Former Smoker     Packs/day: 2.00     Years: 25.00     Pack years: 50.00     Last attempt to quit: 10/27/1977     Years since quitting: 40.5    Smokeless tobacco: Never Used   Substance and Sexual Activity    Alcohol use: Not Currently    Drug use: Never    Sexual activity: Not on file   Lifestyle    Physical activity:     Days per week: Not on  file     Minutes per session: Not on file    Stress: Not on file   Relationships    Social connections:     Talks on phone: Not on file     Gets together: Not on file     Attends religious service: Not on file     Active member of club or organization: Not on file     Attends meetings of clubs or organizations: Not on file     Relationship status: Not on file    Intimate partner violence:     Fear of current or ex partner: Not on file     Emotionally abused: Not on file     Physically abused: Not on file     Forced sexual  activity: Not on file   Other Topics Concern    Not on file   Social History Narrative    Not on file     Patient Active Problem List:     Essential hypertension     Labyrinthitis     Panic disorder     Hyperlipidemia     Chronic cough     Mild episode of recurrent major depressive disorder [facility](HCC)    Past Surgical History:  10/2016: HEMORRHOID SURGERY  No date: OB ANTEPARTUM CARE CESAREAN DLVR & POSTPARTUM  No family history on file.      Current Outpatient Medications:  omeprazole (PRILOSEC) 20 MG capsule Take 1 capsule by mouth daily Disp: 30 capsule Rfl: 2   chlorthalidone (HYGROTEN) 25 MG tablet Take 1 tablet by mouth daily Disp: 30 tablet Rfl: 6   traZODone (DESYREL) 50 MG tablet Take 1 tablet by mouth nightly Disp: 30 tablet Rfl: 2   clonazePAM (KLONOPIN) 1 MG tablet Take 1 tablet by mouth 2 (two) times daily Disp: 60 tablet Rfl: 2   sertraline (ZOLOFT) 100 MG tablet Take 1 tablet by mouth daily Disp: 30 tablet Rfl: 2   raNITIdine (ZANTAC) 150 MG tablet Take 1 tablet by mouth nightly Disp: 30 tablet Rfl: 2   losartan (COZAAR) 25 MG tablet Take 1 tablet by mouth daily Disp: 30 tablet Rfl: 11   Multiple Vitamins-Minerals (HAIR SKIN AND NAILS FORMULA) TABS Take 1 capsule by mouth daily Disp: 30 tablet Rfl: 12   loratadine (CLARITIN) 10 MG tablet Take 1 tablet by mouth daily Disp: 30 tablet Rfl: 2   fluticasone (FLOVENT HFA) 110 MCG/ACT inhaler Inhale 1 puff into the lungs 2 (two) times daily   for 10 days Disp: 1 Inhaler Rfl: 0   atenolol (TENORMIN) 50 MG tablet Take 1 tablet by mouth daily Disp: 30 tablet Rfl: 6   acetaminophen (TYLENOL) 500 MG tablet Take 1 tablet by mouth every 6 (six) hours as needed for Pain Disp: 30 tablet Rfl: 3   fluticasone (FLONASE) 50 MCG/ACT nasal spray 1 spray by Each Nostril route daily Disp: 1 Bottle Rfl: 5   calcium-vitamin D (OSCAL-500) 500-400 MG-UNIT per tablet Take 1 tablet by mouth 2 (two) times daily Disp: 60 tablet Rfl: 11     No current facility-administered medications for this visit.   Review of Patient's Allergies indicates:   Pollen extract          Cough  ROS            BP (!) 154/71 (Site: LA, Position: Sitting, Cuff Size: Lrg)  Pulse 64  Temp 98 F (36.7 C) (Temporal)  Resp 18  Ht 4' 11.11" (1.501 m)  Wt 80.7 kg (178 lb)  SpO2 97%  BMI 35.82 kg/m2  Physical Exam  Nostril :inflammation, + bleeding +left   Physical Exam   Constitutional: She is oriented to person, place, and time and well-developed, well-nourished, and in no distress.   Neurological: She is alert and oriented to person, place, and time. Gait normal.   Skin: Skin is warm and dry.   Psychiatric: Mood, memory, affect and judgment normal.   Nursing note and vitals reviewed.    (J30.9) Allergic rhinitis, unspecified seasonality, unspecified trigger  (primary encounter diagnosis)  Comment: runny nose , with bleeding sometimes. 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  Comment:   Plan: XR CHEST 2 VIEWS        Referred to TB clinic , no symptoms , cough improved  Pt is  going through a lot of stress at home , she is very depression , pt could make this appointment in the next 2-3 months   Spoke to the daughter about the same   cxr orders in the system , can do it when they have time     Essential HTN :  Not well controlle d  Pt is taking extra medication of losartan   Plan : d/c chlorthalidone ,   Will increase losartan to 50 mg in am and 25mg  in pm  Take atenolol in pm   rtc in one month for follow up of HTN     Chronic cough resolved   PFTs normal   Pt did not do the cxr     Major depression :   Following with [facility]       Stress at home :   Will check with social worker if we can get any finantial help for this patient   Will check all labs next month     I have spent 40 minutes in face to face time with this patient/patient proxy of which > 50% was in counseling or coordination of care regarding above issues/Dx.                  We discussed the importance of medication compliance. The patient was ready to learn and no apparent learning barriers were identified. I explained the diagnosis and treatment plan, and the patient expressed understanding of the content. Possible side effects of the prescribed medication(s) were explained, .  I attempted to answer any questions regarding the diagnosis and the proposed treatment.    We discussed the patients current medications. The patient expressed understanding and no barriers to adherence were identified.      Overton Mam, MD

## 2018-05-24 ENCOUNTER — Telehealth (HOSPITAL_BASED_OUTPATIENT_CLINIC_OR_DEPARTMENT_OTHER): Payer: Self-pay

## 2018-05-24 NOTE — Progress Notes (Signed)
This Patient Resources Coordinator [facility]) called pt again today to discuss alternatives for additional employment and to provide information about husband's coverage for prescribed medications.    Pt wasn't available. PRC left a new voice-message explaining the reason for her call and asked pt to call back.

## 2018-05-31 ENCOUNTER — Encounter (HOSPITAL_BASED_OUTPATIENT_CLINIC_OR_DEPARTMENT_OTHER): Payer: Self-pay | Admitting: Internal Medicine

## 2018-06-01 ENCOUNTER — Encounter (HOSPITAL_BASED_OUTPATIENT_CLINIC_OR_DEPARTMENT_OTHER): Payer: Self-pay | Admitting: Internal Medicine

## 2018-06-07 ENCOUNTER — Other Ambulatory Visit (HOSPITAL_BASED_OUTPATIENT_CLINIC_OR_DEPARTMENT_OTHER): Payer: Self-pay | Admitting: Internal Medicine

## 2018-06-07 ENCOUNTER — Other Ambulatory Visit (HOSPITAL_BASED_OUTPATIENT_CLINIC_OR_DEPARTMENT_OTHER): Payer: Self-pay

## 2018-06-07 ENCOUNTER — Encounter (HOSPITAL_BASED_OUTPATIENT_CLINIC_OR_DEPARTMENT_OTHER): Payer: Self-pay | Admitting: Internal Medicine

## 2018-06-07 DIAGNOSIS — I10 Essential (primary) hypertension: Secondary | ICD-10-CM

## 2018-06-07 MED ORDER — AMLODIPINE BESYLATE-VALSARTAN 5-160 MG PO TABS
1.0000 | ORAL_TABLET | Freq: Every day | ORAL | 11 refills | Status: DC
Start: 2018-06-07 — End: 2018-06-20

## 2018-06-07 MED ORDER — AMLODIPINE BESYLATE-VALSARTAN 5-160 MG PO TABS: 1 | tablet | Freq: Every day | ORAL | 11 refills | 0 days | Status: AC

## 2018-06-07 NOTE — Telephone Encounter (Signed)
Letter has been sent to Pt's current address to inform of overdue mammogram. Await call back.    Sara LaineLuana S [employer], 06/07/2018

## 2018-06-09 ENCOUNTER — Encounter (HOSPITAL_BASED_OUTPATIENT_CLINIC_OR_DEPARTMENT_OTHER): Payer: Self-pay | Admitting: Internal Medicine

## 2018-06-14 ENCOUNTER — Ambulatory Visit: Payer: No Typology Code available for payment source

## 2018-06-14 DIAGNOSIS — I10 Essential (primary) hypertension: Secondary | ICD-10-CM

## 2018-06-14 DIAGNOSIS — F410 Panic disorder [episodic paroxysmal anxiety] without agoraphobia: Secondary | ICD-10-CM | POA: Diagnosis not present

## 2018-06-14 DIAGNOSIS — F330 Major depressive disorder, recurrent, mild: Secondary | ICD-10-CM

## 2018-06-14 MED ORDER — VENLAFAXINE HCL ER 75 MG PO CP24
75.0000 mg | ORAL_CAPSULE | Freq: Every day | ORAL | 0 refills | Status: DC
Start: 2018-06-21 — End: 2018-07-26

## 2018-06-14 MED ORDER — TRAZODONE HCL 50 MG PO TABS
50.0000 mg | ORAL_TABLET | Freq: Every evening | ORAL | 2 refills | Status: DC
Start: 2018-06-14 — End: 2018-08-22

## 2018-06-14 MED ORDER — VENLAFAXINE HCL ER 75 MG PO CP24: 75 mg | capsule | Freq: Every day | ORAL | 0 refills | 0 days | Status: AC

## 2018-06-14 MED ORDER — CLONAZEPAM 1 MG PO TABS
1.0000 mg | ORAL_TABLET | Freq: Two times a day (BID) | ORAL | 2 refills | Status: DC
Start: 2018-06-14 — End: 2018-09-21

## 2018-06-14 MED ORDER — VENLAFAXINE HCL ER 37.5 MG PO CP24
37.5000 mg | ORAL_CAPSULE | Freq: Every day | ORAL | 0 refills | Status: DC
Start: 2018-06-14 — End: 2018-07-26

## 2018-06-14 MED ORDER — VENLAFAXINE HCL ER 37.5 MG PO CP24: 38 mg | capsule | Freq: Every day | ORAL | 0 refills | 0 days | Status: AC

## 2018-06-14 MED ORDER — CLONAZEPAM 1 MG PO TABS: 1 mg | tablet | Freq: Two times a day (BID) | ORAL | 2 refills | 0 days | Status: AC

## 2018-06-14 MED ORDER — TRAZODONE HCL 50 MG PO TABS: 50 mg | tablet | Freq: Every evening | ORAL | 2 refills | 0 days | Status: AC

## 2018-06-14 MED FILL — CLONAZEPAM 1MG: 30 days supply | Qty: 60 | Fill #0 | Status: CP

## 2018-06-14 MED FILL — VENLAFAXINE 75MG ER: 30 days supply | Qty: 30 | Fill #0 | Status: CP

## 2018-06-14 MED FILL — VENLAFAXINE  37.5MG ER: 7 days supply | Qty: 7 | Fill #0 | Status: CP

## 2018-06-14 NOTE — Progress Notes (Signed)
OUTPATIENT PSYCHIATRY PROGRESS NOTE      INTERPRETER: None needed.  INTERPRETER NAME: N/A      CONTACT INFO FOR OTHER AGENCIES AND MENTAL HEALTH PROVIDERS:   PCP: Overton MamAparna Batlapenumarthy, MD      FOCUS OF CURRENT SESSION:  Problem 1: Depression   Problem 2: Anxiety / panic attacks   Problem 3: Insomnia       SUBJECTIVE:  TODAY'S CHIEF COMPLAINT AND CLINICAL UPDATES IN PATIENT'S WORDS:  1) Chief Complaint:  "I always feel tense. I am not able to relax"      2) new information from patient and/or collateral:  Pt continues taking Clonazepam, 1 mg BID, 100 mg of Trazodone and 100 mg of Sertraline.   Symptoms of depression are well controlled, but she continues to feel tense. She is not able to relax. She has panic attacks sometimes.   She worries about her blood pressure which continues to be uncontrolled despite tx with multiple medications.   Her PCP recently prescribed amlodipine-valsartan but she didn't start it yet. She is afraid that this won't work since it was not effective in [place].     She worries about having a heart attack or stroke.   She worries about finances. She wants to get a job. Her husband is not working presently since he had kidney surgery a few months ago due to kidney cancer.    Her son in law is about to lose his job.   She is feeling sad since her son who lives in [place] refuses to talk with her. She has not spoken with him for the last three years.     She continues to refuse to increase the dose of Sertraline. She wants to be prescribed Venlafaxine which is effective for her two daughters.   Her sleep has improved with Trazodone.   She denied SI, HI, psychosis, substance abuse problems.     She is now interested in being referred for psychotherapy tx since her daughter comes here once per month to see Adriana Ansin, LCSW.       OBJECTIVE:  DATA REVIEWED: Chart     05/23/18 : (!) 154/71  02/07/18 : (!) 165/77  12/20/17 : 133/76  11/17/17 : (!) 150/80  10/27/17 : (!) 172/90       CURRENT  MEDICATIONS:    amlodipine-valsartan (EXFORGE) 5-160 MG per tablet, Take 1 tablet by mouth daily, Disp: 30 tablet, Rfl: 11    azelastine (ASTELIN) 0.1 % nasal spray, 1 spray by Each Nostril route 2 (two) times daily Use in each nostril as directed, Disp: 30 mL, Rfl: 4    omeprazole (PRILOSEC) 20 MG capsule, Take 1 capsule by mouth daily, Disp: 30 capsule, Rfl: 2    traZODone (DESYREL) 50 MG tablet, Take 1 tablet by mouth nightly, Disp: 30 tablet, Rfl: 2    clonazePAM (KLONOPIN) 1 MG tablet, Take 1 tablet by mouth 2 (two) times daily, Disp: 60 tablet, Rfl: 2    sertraline (ZOLOFT) 100 MG tablet, Take 1 tablet by mouth daily, Disp: 30 tablet, Rfl: 2    raNITIdine (ZANTAC) 150 MG tablet, Take 1 tablet by mouth nightly, Disp: 30 tablet, Rfl: 2    Multiple Vitamins-Minerals (HAIR SKIN AND NAILS FORMULA) TABS, Take 1 capsule by mouth daily, Disp: 30 tablet, Rfl: 12    loratadine (CLARITIN) 10 MG tablet, Take 1 tablet by mouth daily, Disp: 30 tablet, Rfl: 2    fluticasone (FLOVENT HFA) 110 MCG/ACT inhaler, Inhale 1 puff into  the lungs 2 (two) times daily  for 10 days, Disp: 1 Inhaler, Rfl: 0    atenolol (TENORMIN) 50 MG tablet, Take 1 tablet by mouth daily, Disp: 30 tablet, Rfl: 6    acetaminophen (TYLENOL) 500 MG tablet, Take 1 tablet by mouth every 6 (six) hours as needed for Pain, Disp: 30 tablet, Rfl: 3    fluticasone (FLONASE) 50 MCG/ACT nasal spray, 1 spray by Each Nostril route daily, Disp: 1 Bottle, Rfl: 5    calcium-vitamin D (OSCAL-500) 500-400 MG-UNIT per tablet, Take 1 tablet by mouth 2 (two) times daily, Disp: 60 tablet, Rfl: 11        MEDICATION ADHERENCE (including barriers and how addressed): Yes.    MEDICATIONS SIDE EFFECTS: None    PAST MEDICATIONS:    Fluoxetine    BIRTH CONTROL (ask females and males): N/A    CURRENT PREGNANCY: N/A    AIMS: N/A     MENTAL STATUS EXAMINATION                General Appearance: Casually dressed, groomed   Interaction with Interviewer (eye contact, attitude,  behavior): Yes.   Physical Signs   Gait and Station (how patient walks and stands): Within normal limits   Physical Appearance: Within normal limits  Normal Movements: Within normal limits   Speech (rate, volume, articulation): Very talkative  Language: Within normal limits, speaking in [place]  Mood: "Sad and tense"   Affect: Congruent   Thought Process: Logical   Thought Content:  Tangential   Perceptions: No hallucinations   Impulse Control: Good   Cognition (Link to [facility])   Orientation (person, place, time): Yes   Recent and remote memory: Intact   Attention span and concentration: Limited   Fund of knowledge, awareness of current events and vocabulary: Yes   Judgment: Yes   Insight: Limited       SAFETY EXAMINATION (if yes describe findings in detail)   Suicidality: No   Homicidality: No   Aggression toward others:No   Victim or at risk of aggression by others: No   Substance use/abuse (legal and illicit): No         Risk Level Assessment  Risk Level Change (if yes, please describe): No    Suicide: low (1)  Violence: low (1)  Addiction: low (1)        Protective factors: Family connections, faith.       DIAGNOSES ASSESSED TODAY:  (F41.0) Panic disorder  (primary encounter diagnosis)  (F33.0) Mild episode of recurrent major depressive disorder (HCC)  (I10) Essential hypertension      CLINICAL FORMULATION / TODAY'S ASSESSMENT:  Sara Snyder is a 62 year old, married, [place] woman, mother of four children who was referred for psychopharm evaluation and treatment by her PCP.   She has a history of chronic anxiety with panic attacks and depression.   The client has no prior history of psychiatric hospitalizations, mania, psychosis, safety issues, violence or substance abuse issues.   Past medical history includes: Essential hypertension, labyrinthitis, hyperlipidemia and chronic cough  There is a strong familial predisposition to psychiatric problems since family history is positive for depression and anxiety.    Psychiatric symptoms started in [place] in 1996 in the context of work related stress. They recently exacerbated due to immigration related problems and medication discontinuation. Pt has been in tx with Clonazepam and Sertraline since 2005. She ran out of Sertraline recently. Her PCP restarted it, but she was taking a subtherapeutic dose during  the evaluation. It was increased from 50 to 100 mg during the evaluation since she endorsed symptoms of anxiety leading to panic attacks and depression.   She has been adamant about continuing with the same dose of Clonazepam.   She has benefited from splitting the dose from 2 mg QHS to 1 mg BID. She continues to feel anxious and she is having panic attacks less frequently.   Trazodone was added during a previous visit and it has helped with insomnia.   She has been feeling sad and tense, but she denied SI and hopelessness. She does not want to increase the dose of Sertraline. She requests tx with Venlafaxine which is effective for her two daughters.      She continues to struggle with multiple stressors. She has limited psychological coping skills.   She accepts a referral for psychotherapy tx.     She is worried about her BP. She agreed to follow up with her PCP.       Patient's ethnic and cultural factors: Recent [place] immigrant, not able to speak [place].     Patient's religion and spiritual beliefs, values and preferences: Ephriam Knuckles, believes in God. Listens to [employer] and goes to church.        PLAN:  PLAN FOR MANAGING RISK (Consider risk plan for patients at moderate or high risk for suicide/violence/addiction; medication plan; referrals, etc. Must coincide with treatment plan.):   Not at moderate or hight risk.        PLAN:   Taper Sertraline from 100 mg to 50 mg daily for one week, then stop.   Replace Sertraline with 37.5 mg of Venlafaxine for one week, followed by 75 mg daily thereafter.   Monitor blood pressure. Follow up with PCP for tx of blood  pressure.   Continue Clonazepam, 1 mg BID for now.  Will continue to monitor appropriateness of Clonazepam.   Continue Trazodone, 50 mg QHS for sleep.   Will place referral for psychotherapy tx.   F/U in 4 weeks, call sooner if needed   Call 911 or proceed to the nearest ER in case symptoms worsen acutely or in the event of any other psychiatric emergency.  Collaborate with other providers as needed   Order complementary labs and tests as needed   Pt may benefit from the support of Bobbe Medico, mental health care partner or Melvenia Needles, Patient Resources Coordinator.       INFORMED CONSENT (for any new treatment): Patient was informed of the potential risks and benefits of the treatment, including the option not to treat, and appeared to understand and agreed to comply. Discussion included the following key points:   Pt understands the benefits and side effects of Trazodone Clonazepam and Venlafaxine, including increased BP with Venlafaxine.   She consents fully.       REVIEWING TODAY'S VISIT  CLINICAL INTERVENTIONS TODAY:   -Provided medication adjustment-Reviewed medication adherence/tolerance.  -Performed mental status and risk assessments.   -Provided psychoeducation, support.   -Explored ways of increasing psychological coping skills.       PATIENT'S RESPONSE TO INTERVENTIONS: Receptive     PROGRESS TOWARDS GOALS:  Slow progress     Estimated time spent in psychotherapy: N/A      FOR PRESCRIBERS and EVALUATION AND MANAGEMENT VISITS ONLY    Use only when no psychotherapy performed  COUNSELING AND COORDINATION OF CARE PROVIDED (Consider diagnostic results/impressions and/or recommended studies; risks and benefits of treatment options; instruction for management/treatment and/or follow-up;  importance of compliance with chosen treatment options; risk factor reduction; patient/family/caregiver education; prognosis): Yes:       Over 50% of time during today's visit was devoted to counseling and/or  coordination of care.  If yes, record estimated duration of the face to face encounter.    YES,  ESTIMATED TIME: 40 minutes     INSTRUCTIONS TO COVERING CLINICIANS:  OK to refill.    [name], APRN

## 2018-06-16 MED FILL — TRAZODONE  50MG: 30 days supply | Qty: 30 | Fill #0

## 2018-06-19 ENCOUNTER — Encounter (HOSPITAL_BASED_OUTPATIENT_CLINIC_OR_DEPARTMENT_OTHER): Payer: Self-pay | Admitting: Internal Medicine

## 2018-06-20 ENCOUNTER — Encounter (HOSPITAL_BASED_OUTPATIENT_CLINIC_OR_DEPARTMENT_OTHER): Payer: Self-pay

## 2018-06-20 ENCOUNTER — Telehealth (HOSPITAL_BASED_OUTPATIENT_CLINIC_OR_DEPARTMENT_OTHER): Payer: Self-pay | Admitting: Registered Nurse

## 2018-06-20 NOTE — Progress Notes (Signed)
Spoke with pt's daughter Izell Carolinandreza  Pt confused as to which BP medication she should be taking now  Wants to know if she should also be on atenolol 50 mg  Was able to pick up amlodipine-valsartan and took 1st dose today  [facility]    Daughter asking to have message sent to [employer] as cannot answer the phone during the day    Plan is to check with provider and get back to her about BP meds and assist in scheduling a f/up with PCP    Routing to provider    Dairl PonderSandra Krisinda Giovanni, RN, 06/20/2018

## 2018-06-20 NOTE — Progress Notes (Signed)
TC to pt and LM on VM to call clinic back at [phone] and ask to speak to a nurse    Dairl PonderSandra Hali Balgobin, RN, 06/20/2018               Pt's daughter returning call regarding pt's medications list   Received: Today   Message Contents   Fanny BienLuana S Braga  P Ec Rn Pool   Phone Number: [phone]

## 2018-06-20 NOTE — Telephone Encounter (Signed)
I called and left a message with the interp [phone] to call and speak with any nurse.

## 2018-06-21 MED ORDER — AMLODIPINE BESYLATE-VALSARTAN 5-160 MG PO TABS
1.0000 | ORAL_TABLET | Freq: Every day | ORAL | 0 refills | Status: DC
Start: 2018-06-21 — End: 2018-06-29

## 2018-06-21 MED ORDER — AMLODIPINE BESYLATE-VALSARTAN 5-160 MG PO TABS: 1 | tablet | Freq: Every day | ORAL | 0 refills | 0 days | Status: AC

## 2018-06-21 MED FILL — AMLOD/VALSAR 5-160MG: 30 days supply | Qty: 30 | Fill #0 | Status: CP

## 2018-06-21 NOTE — Progress Notes (Signed)
Covering for PCP. Based on chart review, last visit note, appears pt advised to continue atenolol. Will refill x 30 days however pt advised to f/u 1-2 weeks from more recent mychart note with PCP and has not scheduled. Please inform pt/schedule with PCP or teamlet 1-2 weeks for HTN f/u. CMPAC

## 2018-06-21 NOTE — Progress Notes (Signed)
Left a message on an identified voice mail  To call clinic to schedule an apt  Await a call back    Spoke with daughter also ana (paula)  Relayed need for apt to check BP and confirm medication regime  Apt made with PCP  Verbalized back and agrees with plan

## 2018-06-29 ENCOUNTER — Ambulatory Visit: Payer: No Typology Code available for payment source | Attending: Internal Medicine | Admitting: Internal Medicine

## 2018-06-29 ENCOUNTER — Encounter (HOSPITAL_BASED_OUTPATIENT_CLINIC_OR_DEPARTMENT_OTHER): Payer: Self-pay | Admitting: Internal Medicine

## 2018-06-29 VITALS — BP 152/86 | HR 90 | Temp 98.4°F | Resp 16 | Ht 59.11 in | Wt 179.0 lb

## 2018-06-29 DIAGNOSIS — I10 Essential (primary) hypertension: Secondary | ICD-10-CM

## 2018-06-29 DIAGNOSIS — R944 Abnormal results of kidney function studies: Secondary | ICD-10-CM | POA: Diagnosis not present

## 2018-06-29 DIAGNOSIS — N183 Chronic kidney disease, stage 3 unspecified: Secondary | ICD-10-CM

## 2018-06-29 MED ORDER — AMLODIPINE BESYLATE-VALSARTAN 5-160 MG PO TABS
1.0000 | ORAL_TABLET | Freq: Every day | ORAL | 0 refills | Status: DC
Start: 2018-06-29 — End: 2018-06-29

## 2018-06-29 MED ORDER — ATENOLOL 50 MG PO TABS: 50 mg | tablet | Freq: Every day | ORAL | 6 refills | 0 days | Status: AC

## 2018-06-29 MED ORDER — ATENOLOL 50 MG PO TABS
50.0000 mg | ORAL_TABLET | Freq: Every day | ORAL | 6 refills | Status: DC
Start: 2018-06-29 — End: 2018-09-13

## 2018-06-29 MED ORDER — CHLORTHALIDONE 25 MG PO TABS: 25 mg | tablet | Freq: Every day | ORAL | 6 refills | 0 days | Status: AC

## 2018-06-29 MED ORDER — AMLODIPINE BESYLATE-VALSARTAN 5-160 MG PO TABS: 1 | tablet | Freq: Every day | ORAL | 6 refills | 0 days | Status: AC

## 2018-06-29 MED ORDER — AMLODIPINE BESYLATE-VALSARTAN 5-160 MG PO TABS: 1 | tablet | Freq: Every day | ORAL | 0 refills | 0 days | Status: DC

## 2018-06-29 MED ORDER — CHLORTHALIDONE 25 MG PO TABS
25.0000 mg | ORAL_TABLET | Freq: Every day | ORAL | 6 refills | Status: DC
Start: 2018-06-29 — End: 2018-09-13

## 2018-06-29 MED ORDER — AMLODIPINE BESYLATE-VALSARTAN 5-160 MG PO TABS
1.0000 | ORAL_TABLET | Freq: Every day | ORAL | 6 refills | Status: DC
Start: 2018-06-29 — End: 2018-08-22

## 2018-06-29 MED FILL — *CHLORTHALIDONE 25MG: 30 days supply | Qty: 30 | Fill #0 | Status: CP

## 2018-06-29 MED FILL — ATENOLOL 50MG: 30 days supply | Qty: 30 | Fill #0 | Status: CP

## 2018-06-29 NOTE — Progress Notes (Signed)
Pt feels safe at home.  Makaleigh Reinard, [place], 06/29/2018

## 2018-06-29 NOTE — Progress Notes (Signed)
HPI  Sara Snyder is a 63 year old female presenting for f/u BP     HTN  -BP is elevated today  Most Recent BP Reading(s)  06/29/18 : (!) 152/86  05/23/18 : (!) 154/71  02/07/18 : (!) 165/77  -Started taking amlodipine 5-160mg  daily   -At night BP is 160-180/90-100 so patient will also take atenolol 50mg   -confused about her medications, she is not taking atenolol daily   -Reports lots of stress because his husband lost his job after surgery     Patient Active Problem List:     Essential hypertension     Labyrinthitis     Panic disorder     Hyperlipidemia     Chronic cough     Mild episode of recurrent major depressive disorder (HCC)     Allergic rhinitis     Need for prophylactic vaccination and inoculation against influenza     Left arm pain    Social History     Socioeconomic History    Marital status: Married     Spouse name: Not on file    Number of children: Not on file    Years of education: Not on file    Highest education level: Not on file   Occupational History    Not on file   Social Needs    Financial resource strain: Not on file    Food insecurity:     Worry: Not on file     Inability: Not on file    Transportation needs:     Medical: Not on file     Non-medical: Not on file   Tobacco Use    Smoking status: Former Smoker     Packs/day: 2.00     Years: 25.00     Pack years: 50.00     Last attempt to quit: 10/27/1977     Years since quitting: 40.6    Smokeless tobacco: Never Used   Substance and Sexual Activity    Alcohol use: Not Currently    Drug use: Never    Sexual activity: Yes     Partners: Male   Lifestyle    Physical activity:     Days per week: Not on file     Minutes per session: Not on file    Stress: Not on file   Relationships    Social connections:     Talks on phone: Not on file     Gets together: Not on file     Attends religious service: Not on file     Active member of club or organization: Not on file     Attends meetings of clubs or organizations: Not on file      Relationship status: Not on file    Intimate partner violence:     Fear of current or ex partner: Not on file     Emotionally abused: Not on file     Physically abused: Not on file     Forced sexual activity: Not on file   Other Topics Concern    Not on file   Social History Narrative    Not on file     Past Surgical History:  10/2016: HEMORRHOID SURGERY  No date: OB ANTEPARTUM CARE CESAREAN DLVR & POSTPARTUM  No family history on file.      Current Outpatient Medications:  amlodipine-valsartan (EXFORGE) 5-160 MG per tablet Take 1 tablet by mouth daily Disp: 30 tablet Rfl: 0   venlafaxine (EFFEXOR-XR) 37.5  MG 24 hr capsule Take 1 capsule by mouth daily  for 7 days Disp: 7 capsule Rfl: 0   venlafaxine (EFFEXOR-XR) 75 MG 24 hr capsule Take 1 capsule by mouth daily Start on 06/21/18 after completing treatment with 37.5 mg daily. Disp: 30 capsule Rfl: 0   traZODone (DESYREL) 50 MG tablet Take 1 tablet by mouth nightly Disp: 30 tablet Rfl: 2   clonazePAM (KLONOPIN) 1 MG tablet Take 1 tablet by mouth 2 (two) times daily Disp: 60 tablet Rfl: 2   sertraline (ZOLOFT) 100 MG tablet Take 1/2 tablet daily for one week, then stop. Disp: 30 tablet Rfl: 2   azelastine (ASTELIN) 0.1 % nasal spray 1 spray by Each Nostril route 2 (two) times daily Use in each nostril as directed Disp: 30 mL Rfl: 4   omeprazole (PRILOSEC) 20 MG capsule Take 1 capsule by mouth daily Disp: 30 capsule Rfl: 2   raNITIdine (ZANTAC) 150 MG tablet Take 1 tablet by mouth nightly Disp: 30 tablet Rfl: 2   Multiple Vitamins-Minerals (HAIR SKIN AND NAILS FORMULA) TABS Take 1 capsule by mouth daily Disp: 30 tablet Rfl: 12   loratadine (CLARITIN) 10 MG tablet Take 1 tablet by mouth daily Disp: 30 tablet Rfl: 2   fluticasone (FLOVENT HFA) 110 MCG/ACT inhaler Inhale 1 puff into the lungs 2 (two) times daily  for 10 days Disp: 1 Inhaler Rfl: 0   atenolol (TENORMIN) 50 MG tablet Take 1 tablet by mouth daily Disp: 30 tablet Rfl: 6   acetaminophen (TYLENOL) 500 MG  tablet Take 1 tablet by mouth every 6 (six) hours as needed for Pain Disp: 30 tablet Rfl: 3   fluticasone (FLONASE) 50 MCG/ACT nasal spray 1 spray by Each Nostril route daily Disp: 1 Bottle Rfl: 5   calcium-vitamin D (OSCAL-500) 500-400 MG-UNIT per tablet Take 1 tablet by mouth 2 (two) times daily Disp: 60 tablet Rfl: 11     No current facility-administered medications for this visit.   Review of Patient's Allergies indicates:   Pollen extract          Cough  Review of Systems   Constitution: Negative.   HENT: Negative.    Eyes: Negative.    Cardiovascular: Negative.    Respiratory: Negative.    Endocrine: Negative.    Hematologic/Lymphatic: Negative.    Skin: Negative.    Musculoskeletal: Negative.    Gastrointestinal: Negative.    Genitourinary: Negative.    Neurological: Negative.    Psychiatric/Behavioral: Negative.    Allergic/Immunologic: Negative.        BP (!) 152/86 (Site: LA, Position: Sitting, Cuff Size: Reg)  Pulse 90  Temp 98.4 F (36.9 C) (Temporal)  Resp 16  Ht 4' 11.11" (1.501 m)  Wt 81.2 kg (179 lb)  SpO2 97%  BMI 36.02 kg/m2  Physical Exam  Physical Exam   Constitutional: She is oriented to person, place, and time and well-developed, well-nourished, and in no distress.   Neurological: She is alert and oriented to person, place, and time. Gait normal.   Skin: Skin is warm and dry.   Psychiatric: Mood, memory, affect and judgment normal.   Nursing note and vitals reviewed.    Assessment /Plan       (I10) Essential hypertension  (primary encounter diagnosis)  -BP is elevated today  Most Recent BP Reading(s)  06/29/18 : (!) 152/86  05/23/18 : (!) 154/71  02/07/18 : (!) 165/77  -Started taking amlodipine 5-160mg  daily   -At night BP is 160-180/90-100 so  patient will also take atenolol 50mg   -confused about her medications, she is not taking atenolol daily   -Reports lots of stress because his husband lost his job after surgery   Plan: Went through medication list with patient:  -atenolol (TENORMIN) 50  MG tablet to take at 6pm  -chlorthalidone (HYGROTEN) 25 MG tablet to take at lunch time  -Continue amlodipine-valsartan (EXFORGE) 5-160 MG daily         F/u in 1 month for bp check   Pt on multiple BP meds : potential to develop dizziness    Will space BP meds and see if it works for patient           Production assistant, radio- Chart written by Camelia Eng, 06/29/2018     Written in the presence of and acting as a scribe for Overton Mam, MD     I have spent 25 minutes in face to face time with this patient/patient proxy of which > 50% was in counseling or coordination of care regarding above issues/Dx.      Attending to Scribe-Chart documented by scribe in my presence. Documentation accepted as noted in record. Work performed by me and documented by scribe. Any changes or disagreements are noted in record Overton Mam, MD, 06/29/2018   We discussed the importance of medication compliance. The patient was ready to learn and no apparent learning barriers were identified. I explained the diagnosis and treatment plan, and the patient expressed understanding of the content. Possible side effects of the prescribed medication(s) were explained. I attempted to answer any questions regarding the diagnosis and the proposed treatment.    We discussed the patients current medications. The patient expressed understanding and no barriers to adherence were identified.

## 2018-07-18 MED FILL — TRAZODONE  50MG: 30 days supply | Qty: 30 | Fill #0 | Status: CP

## 2018-07-18 MED FILL — CLONAZEPAM 1MG: 30 days supply | Qty: 60 | Fill #1 | Status: CP

## 2018-07-20 ENCOUNTER — Encounter (HOSPITAL_BASED_OUTPATIENT_CLINIC_OR_DEPARTMENT_OTHER): Payer: Self-pay

## 2018-07-25 ENCOUNTER — Encounter (HOSPITAL_BASED_OUTPATIENT_CLINIC_OR_DEPARTMENT_OTHER): Payer: Self-pay

## 2018-07-26 ENCOUNTER — Ambulatory Visit (HOSPITAL_BASED_OUTPATIENT_CLINIC_OR_DEPARTMENT_OTHER): Payer: No Typology Code available for payment source

## 2018-07-26 ENCOUNTER — Other Ambulatory Visit (HOSPITAL_BASED_OUTPATIENT_CLINIC_OR_DEPARTMENT_OTHER): Payer: Self-pay

## 2018-07-26 ENCOUNTER — Other Ambulatory Visit (HOSPITAL_BASED_OUTPATIENT_CLINIC_OR_DEPARTMENT_OTHER): Payer: Self-pay | Admitting: Internal Medicine

## 2018-07-26 ENCOUNTER — Ambulatory Visit
Admission: RE | Admit: 2018-07-26 | Discharge: 2018-07-26 | Disposition: A | Discharge status: Home / Self Care | Payer: No Typology Code available for payment source | Attending: Internal Medicine | Admitting: Internal Medicine

## 2018-07-26 ENCOUNTER — Encounter (HOSPITAL_BASED_OUTPATIENT_CLINIC_OR_DEPARTMENT_OTHER): Payer: Self-pay

## 2018-07-26 DIAGNOSIS — R922 Inconclusive mammogram: Secondary | ICD-10-CM | POA: Insufficient documentation

## 2018-07-26 DIAGNOSIS — R928 Other abnormal and inconclusive findings on diagnostic imaging of breast: Secondary | ICD-10-CM

## 2018-07-26 DIAGNOSIS — Z1239 Encounter for other screening for malignant neoplasm of breast: Secondary | ICD-10-CM

## 2018-07-26 DIAGNOSIS — Z1231 Encounter for screening mammogram for malignant neoplasm of breast: Secondary | ICD-10-CM

## 2018-07-26 DIAGNOSIS — F330 Major depressive disorder, recurrent, mild: Secondary | ICD-10-CM

## 2018-07-26 MED ORDER — VENLAFAXINE HCL ER 75 MG PO CP24: 75 mg | capsule | Freq: Every day | ORAL | 2 refills | 0 days | Status: AC

## 2018-07-26 MED ORDER — VENLAFAXINE HCL ER 75 MG PO CP24
75.0000 mg | ORAL_CAPSULE | Freq: Every day | ORAL | 2 refills | Status: DC
Start: 2018-07-26 — End: 2018-09-13

## 2018-07-26 MED FILL — VENLAFAXINE 75MG ER: 30 days supply | Qty: 30 | Fill #0 | Status: CP

## 2018-07-26 NOTE — Progress Notes (Signed)
Sent refills on Venlafaxine as requested. Pt will call to schedule a follow up appt.     [name], APRN
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
## 2018-07-29 ENCOUNTER — Ambulatory Visit (HOSPITAL_BASED_OUTPATIENT_CLINIC_OR_DEPARTMENT_OTHER): Payer: Self-pay

## 2018-08-03 ENCOUNTER — Other Ambulatory Visit (HOSPITAL_BASED_OUTPATIENT_CLINIC_OR_DEPARTMENT_OTHER): Payer: Self-pay | Admitting: Internal Medicine

## 2018-08-03 ENCOUNTER — Ambulatory Visit
Admission: RE | Admit: 2018-08-03 | Discharge: 2018-08-03 | Disposition: A | Discharge status: Home / Self Care | Payer: No Typology Code available for payment source | Attending: Internal Medicine | Admitting: Internal Medicine

## 2018-08-03 DIAGNOSIS — R928 Other abnormal and inconclusive findings on diagnostic imaging of breast: Secondary | ICD-10-CM | POA: Diagnosis not present

## 2018-08-03 DIAGNOSIS — N6489 Other specified disorders of breast: Secondary | ICD-10-CM

## 2018-08-04 ENCOUNTER — Encounter (HOSPITAL_BASED_OUTPATIENT_CLINIC_OR_DEPARTMENT_OTHER): Payer: Self-pay | Admitting: Internal Medicine

## 2018-08-05 ENCOUNTER — Other Ambulatory Visit (HOSPITAL_BASED_OUTPATIENT_CLINIC_OR_DEPARTMENT_OTHER): Payer: Self-pay

## 2018-08-05 DIAGNOSIS — R7612 Nonspecific reaction to cell mediated immunity measurement of gamma interferon antigen response without active tuberculosis: Secondary | ICD-10-CM

## 2018-08-08 ENCOUNTER — Encounter (HOSPITAL_BASED_OUTPATIENT_CLINIC_OR_DEPARTMENT_OTHER): Payer: Self-pay

## 2018-08-11 ENCOUNTER — Other Ambulatory Visit (HOSPITAL_BASED_OUTPATIENT_CLINIC_OR_DEPARTMENT_OTHER): Payer: Self-pay

## 2018-08-11 ENCOUNTER — Encounter (HOSPITAL_BASED_OUTPATIENT_CLINIC_OR_DEPARTMENT_OTHER): Payer: Self-pay

## 2018-08-11 NOTE — Telephone Encounter (Signed)
Outreach htn   Message sent to my chart   Await reply  Annamary Rummage, [place], 08/11/2018

## 2018-08-12 ENCOUNTER — Ambulatory Visit: Payer: No Typology Code available for payment source | Admitting: Internal Medicine

## 2018-08-19 ENCOUNTER — Encounter (HOSPITAL_BASED_OUTPATIENT_CLINIC_OR_DEPARTMENT_OTHER): Payer: Self-pay

## 2018-08-22 ENCOUNTER — Encounter (HOSPITAL_BASED_OUTPATIENT_CLINIC_OR_DEPARTMENT_OTHER): Payer: Self-pay | Admitting: Internal Medicine

## 2018-08-22 ENCOUNTER — Ambulatory Visit: Payer: No Typology Code available for payment source

## 2018-08-22 ENCOUNTER — Ambulatory Visit: Payer: No Typology Code available for payment source | Attending: Internal Medicine | Admitting: Internal Medicine

## 2018-08-22 VITALS — BP 133/78 | HR 73 | Temp 98.5°F | Resp 16 | Ht 58.66 in | Wt 184.0 lb

## 2018-08-22 DIAGNOSIS — I10 Essential (primary) hypertension: Secondary | ICD-10-CM | POA: Diagnosis not present

## 2018-08-22 DIAGNOSIS — F330 Major depressive disorder, recurrent, mild: Secondary | ICD-10-CM | POA: Diagnosis not present

## 2018-08-22 DIAGNOSIS — R7303 Prediabetes: Secondary | ICD-10-CM

## 2018-08-22 DIAGNOSIS — M7989 Other specified soft tissue disorders: Secondary | ICD-10-CM | POA: Diagnosis not present

## 2018-08-22 DIAGNOSIS — F410 Panic disorder [episodic paroxysmal anxiety] without agoraphobia: Secondary | ICD-10-CM

## 2018-08-22 LAB — BASIC METABOLIC PANEL
ANION GAP: 5 mmol/L (ref 5–15)
BUN (UREA NITROGEN): 15 mg/dL (ref 7–18)
CALCIUM: 9.6 mg/dL (ref 8.5–10.1)
CARBON DIOXIDE: 31 mmol/L (ref 21–32)
CHLORIDE: 104 mmol/L (ref 98–107)
CREATININE: 1.3 mg/dL — ABNORMAL HIGH (ref 0.4–1.2)
ESTIMATED GLOMERULAR FILT RATE: 41 mL/min — ABNORMAL LOW (ref >60)
Glucose Random: 97 mg/dL (ref 74–160)
POTASSIUM: 4 mmol/L (ref 3.5–5.1)
SODIUM: 140 mmol/L (ref 136–145)

## 2018-08-22 MED ORDER — VALSARTAN 320 MG PO TABS
320.0000 mg | ORAL_TABLET | Freq: Every day | ORAL | 2 refills | Status: DC
Start: 2018-08-22 — End: 2018-09-13

## 2018-08-22 MED ORDER — TRAZODONE HCL 50 MG PO TABS: 50 mg | tablet | Freq: Every evening | ORAL | 2 refills | 0 days | Status: AC

## 2018-08-22 MED ORDER — VENLAFAXINE HCL ER 37.5 MG PO CP24
ORAL_CAPSULE | ORAL | 2 refills | Status: DC
Start: 2018-08-22 — End: 2018-09-13

## 2018-08-22 MED ORDER — VALSARTAN 320 MG PO TABS: 320 mg | tablet | Freq: Every day | ORAL | 2 refills | 0 days | Status: AC

## 2018-08-22 MED ORDER — TRAZODONE HCL 50 MG PO TABS
50.0000 mg | ORAL_TABLET | Freq: Every evening | ORAL | 2 refills | Status: DC
Start: 2018-08-22 — End: 2018-09-13

## 2018-08-22 MED ORDER — VENLAFAXINE HCL ER 37.5 MG PO CP24: capsule | ORAL | 2 refills | 0 days | Status: AC

## 2018-08-22 MED FILL — TRAZODONE  50MG: 30 days supply | Qty: 30 | Fill #0 | Status: CP

## 2018-08-22 MED FILL — ATENOLOL 50MG: 30 days supply | Qty: 30 | Fill #1 | Status: CP

## 2018-08-22 MED FILL — VENLAFAXINE 75MG ER: 30 days supply | Qty: 30 | Fill #1 | Status: CP

## 2018-08-22 MED FILL — CLONAZEPAM 1MG: 30 days supply | Qty: 60 | Fill #2 | Status: CP

## 2018-08-22 MED FILL — *CHLORTHALID 25MG: 30 days supply | Qty: 30 | Fill #1 | Status: CP

## 2018-08-22 MED FILL — VALSARTAN 320MG: 30 days supply | Qty: 30 | Fill #0 | Status: CP

## 2018-08-22 MED FILL — VENLAFAXINE  37.5MG ER: 30 days supply | Qty: 30 | Fill #0 | Status: CP

## 2018-08-22 NOTE — Progress Notes (Signed)
[facility]    CC: Swelling in ankles    HPI: Sara Snyder is a 63 year old female patient of Aparna Batlapenumarthy, MD who presents to clinic to discuss leg swelling/blood pressure management.  Reports that her medications for blood pressure were recently changed, addition of amlodipine-valsartan combination several months ago.  States that she does take her blood pressure medication daily, but takes each one at a different time of day, states this is how she was told to do by her PCP.  Wishes that she took medications that lasted 24 hours.  Notes swelling in her legs, which is worrying her. This is a new symptoms she has not had before. Causing some pain/feeling of bloating in her legs.  Denies chest pain, shortness of breath.            Significant Past Medical History  Patient Active Problem List:     Essential hypertension     Labyrinthitis     Panic disorder     Hyperlipidemia     Chronic cough     Mild episode of recurrent major depressive disorder (HCC)     Allergic rhinitis     Need for prophylactic vaccination and inoculation against influenza     Left arm pain      Allergies: Review of Patient's Allergies indicates:   Pollen extract          Cough    Medications:   Current Outpatient Medications on File Prior to Visit:  venlafaxine (EFFEXOR-XR) 37.5 MG 24 hr capsule Take 1 capsule daily with 75 mg. TDD=112.5MG  Disp: 30 capsule Rfl: 2   traZODone (DESYREL) 50 MG tablet Take 1 tablet by mouth nightly Disp: 30 tablet Rfl: 2   venlafaxine (EFFEXOR-XR) 75 MG 24 hr capsule Take 1 capsule by mouth daily Disp: 30 capsule Rfl: 2   atenolol (TENORMIN) 50 MG tablet Take 1 tablet by mouth daily Disp: 90 tablet Rfl: 6   chlorthalidone (HYGROTEN) 25 MG tablet Take 1 tablet by mouth daily Disp: 90 tablet Rfl: 6   [DISCONTINUED] amlodipine-valsartan (EXFORGE) 5-160 MG per tablet Take 1 tablet by mouth daily Disp: 90 tablet Rfl: 6   clonazePAM (KLONOPIN) 1 MG tablet Take 1 tablet by mouth 2 (two) times  daily Disp: 60 tablet Rfl: 2   [DISCONTINUED] traZODone (DESYREL) 50 MG tablet Take 1 tablet by mouth nightly Disp: 30 tablet Rfl: 2   [DISCONTINUED] sertraline (ZOLOFT) 100 MG tablet Take 1/2 tablet daily for one week, then stop. Disp: 30 tablet Rfl: 2   azelastine (ASTELIN) 0.1 % nasal spray 1 spray by Each Nostril route 2 (two) times daily Use in each nostril as directed Disp: 30 mL Rfl: 4   omeprazole (PRILOSEC) 20 MG capsule Take 1 capsule by mouth daily Disp: 30 capsule Rfl: 2   raNITIdine (ZANTAC) 150 MG tablet Take 1 tablet by mouth nightly Disp: 30 tablet Rfl: 2   Multiple Vitamins-Minerals (HAIR SKIN AND NAILS FORMULA) TABS Take 1 capsule by mouth daily Disp: 30 tablet Rfl: 12   loratadine (CLARITIN) 10 MG tablet Take 1 tablet by mouth daily Disp: 30 tablet Rfl: 2   fluticasone (FLOVENT HFA) 110 MCG/ACT inhaler Inhale 1 puff into the lungs 2 (two) times daily  for 10 days Disp: 1 Inhaler Rfl: 0   acetaminophen (TYLENOL) 500 MG tablet Take 1 tablet by mouth every 6 (six) hours as needed for Pain Disp: 30 tablet Rfl: 3   fluticasone (FLONASE) 50 MCG/ACT nasal spray  1 spray by Each Nostril route daily Disp: 1 Bottle Rfl: 5   calcium-vitamin D (OSCAL-500) 500-400 MG-UNIT per tablet Take 1 tablet by mouth 2 (two) times daily Disp: 60 tablet Rfl: 11     No current facility-administered medications on file prior to visit.     Family/Social History:   Originally from [place], [place]  History of childhood trauma  Lives with family       Exam  BP 133/78 (Site: RA, Position: Sitting, Cuff Size: Reg)  Pulse 73  Temp 98.5 F (36.9 C) (Oral)  Resp 16  Ht 4' 10.66" (1.49 m)  Wt 83.5 kg (184 lb)  SpO2 95%  Breastfeeding? No  BMI 37.59 kg/m2  Pain Score: Data Unavailable  Gen: Obese woman   HEENT: AT/NC, MMM  Lung: Comfortable on RA, CTAB  CV: RRR, no murmurs  Ext: Non pitting edema in shins/feet  Neuro/Psych: Anxious, frequently asking questions, then responded before answer is given/intepreted      ASSESSMENT/PLAN:  (M79.89)  Leg swelling  (primary encounter diagnosis)  Comment: Consistent with amlodipine side effect  Plan:  Discontinue amlodopine    (I10) Essential hypertension  Comment: Blood pressure at goal today, but need to stop amlodipine. Agrees to increased dose of valsartan. Long discussed in regards to administration of medication. Agrees that once daily dosing would be easier for her. States she previously worried about this due to a neighbor/friend who had hypotension which patient attributes to having caused her death. Asks me to promise nothing bad will happen to her, which I am unable to do, but did talk about usual practices related to medication.  Plan: valsartan (DIOVAN) 320 MG tablet, BASIC         METABOLIC PANEL       Return to PCP as already scheduled in 2 weeks    (R73.03) Prediabetes  Comment: Requests recheck  Plan: HEMOGLOBIN A1C         Diagnosis and plan discussed with patient, questions answered. Problem list updated with relevant plan.        Wallace Going MD     I have spent 25 minutes in face to face time with this patient/patient proxy of which > 50% was in counseling or coordination of care regarding above issues/Dx.

## 2018-08-22 NOTE — Progress Notes (Signed)
OUTPATIENT PSYCHIATRY PROGRESS NOTE      INTERPRETER: None needed.  INTERPRETER NAME: N/A      CONTACT INFO FOR OTHER AGENCIES AND MENTAL HEALTH PROVIDERS:   PCP: Overton Mam, MD  Psychotherapist: Mellody Memos, [facility]        FOCUS OF CURRENT SESSION:  Problem 1: Depression   Problem 2: Anxiety / panic attacks   Problem 3: Insomnia       SUBJECTIVE:  TODAY'S CHIEF COMPLAINT AND CLINICAL UPDATES IN PATIENT'S WORDS:  1) Chief Complaint:  "I'm still feeling very anxious".       2) new information from patient and/or collateral:  Harrold Donath, APRN, new employee in training is present during today's visit. Pt gives permission for her to participate fully in the session. Ms. Donzetta Sprung helped to co-author this note.   During our last visit in December, pt asked to replace Sertraline with Venlafaxine. Her daughters had recommended treatment with Venlafaxine since it was effective for her.   She discontinued Sertraline successfully. The dose of Venlafaxine was titrated to 75 mg daily.   Pt reports that Venlafaxine has been partially effective for anxiety. She continues taking 2 mg of Clonazepam along with 50 mg of Trazodone.   She denies having panic attacks, but she continues to feel very anxious. Her husband and daughter have noticed her anxiety as well.    She is requesting to increase the dose of Venlafaxine.     She is sleeping well with Trazodone. Her appetite is normal.   She is also experiencing symptoms of depression, but she denied SI and hopelessness.   She is experiencing some psychosocial stressors.   She reported living in a basement that is too cold and she thinks that this is causing her feet to swell.   She reported that she is worried about her health and this is causing her increased stress and anxiety.   She has not made an appointment with her PCP because she relies on her daughter to accompany her to the appointments and her daughter has been very busy with work.   She reported that her  daughter does not teach her how to take public transportation, but she would like to learn.    Her husband is working at night now, Doctor, hospital. Her husband had surgery to remove his right kidney due to cancer last year. He is recovering well.   She reported feeling sad since she doesn't know the whereabout of her son. She hasn't had any contact with him for the last 2 years. She thinks that he has problems with drugs and alcohol.     She had one psychotherapy visit with Mellody Memos, [facility]. Her daughter had agreed to schedule a follow up visit with him, but she didn't schedule one. She agreed that we speak with her daughter about this.       OBJECTIVE:  DATA REVIEWED: Chart     05/23/18 : (!) 154/71  02/07/18 : (!) 165/77  12/20/17 : 133/76  11/17/17 : (!) 150/80  10/27/17 : (!) 172/90       CURRENT MEDICATIONS:    TraZODone (DESYREL) 50 MG tablet, Take 1 tablet by mouth nightly, Disp: 30 tablet, Rfl: 2    Venlafaxine (EFFEXOR-XR) 75 MG 24 hr capsule, Take 1 capsule by mouth daily, Disp: 30 capsule, Rfl: 2    amlodipine-valsartan (EXFORGE) 5-160 MG per tablet, Take 1 tablet by mouth daily, Disp: 90 tablet, Rfl: 6    atenolol (TENORMIN) 50 MG  tablet, Take 1 tablet by mouth daily, Disp: 90 tablet, Rfl: 6    chlorthalidone (HYGROTEN) 25 MG tablet, Take 1 tablet by mouth daily, Disp: 90 tablet, Rfl: 6    ClonazePAM (KLONOPIN) 1 MG tablet, Take 1 tablet by mouth 2 (two) times daily, Disp: 60 tablet, Rfl: 2    azelastine (ASTELIN) 0.1 % nasal spray, 1 spray by Each Nostril route 2 (two) times daily Use in each nostril as directed, Disp: 30 mL, Rfl: 4    omeprazole (PRILOSEC) 20 MG capsule, Take 1 capsule by mouth daily, Disp: 30 capsule, Rfl: 2    raNITIdine (ZANTAC) 150 MG tablet, Take 1 tablet by mouth nightly, Disp: 30 tablet, Rfl: 2    Multiple Vitamins-Minerals (HAIR SKIN AND NAILS FORMULA) TABS, Take 1 capsule by mouth daily, Disp: 30 tablet, Rfl: 12    loratadine (CLARITIN) 10 MG tablet, Take  1 tablet by mouth daily, Disp: 30 tablet, Rfl: 2    fluticasone (FLOVENT HFA) 110 MCG/ACT inhaler, Inhale 1 puff into the lungs 2 (two) times daily  for 10 days, Disp: 1 Inhaler, Rfl: 0    acetaminophen (TYLENOL) 500 MG tablet, Take 1 tablet by mouth every 6 (six) hours as needed for Pain, Disp: 30 tablet, Rfl: 3    fluticasone (FLONASE) 50 MCG/ACT nasal spray, 1 spray by Each Nostril route daily, Disp: 1 Bottle, Rfl: 5    calcium-vitamin D (OSCAL-500) 500-400 MG-UNIT per tablet, Take 1 tablet by mouth 2 (two) times daily, Disp: 60 tablet, Rfl: 11      MEDICATION ADHERENCE (including barriers and how addressed): Yes.    MEDICATIONS SIDE EFFECTS: None    PAST MEDICATIONS:    Fluoxetine  Sertraline-Not effective     BIRTH CONTROL (ask females and males): N/A    CURRENT PREGNANCY: N/A    AIMS: N/A     MENTAL STATUS EXAMINATION                General Appearance: Casually dressed, groomed   Interaction with Interviewer (eye contact, attitude, behavior): Yes, very communicative.   Physical Signs   Gait and Station (how patient walks and stands): Within normal limits   Physical Appearance: Within normal limits  Normal Movements: Within normal limits   Speech (rate, volume, articulation): Very talkative and loud at times  Language: Within normal limits, speaking in [place]  Mood: "Very anxious"  Affect: Congruent   Thought Process: Logical   Thought Content:  Tangential   Perceptions: No hallucinations   Impulse Control: Good   Cognition (Link to [facility])   Orientation (person, place, time): Yes   Recent and remote memory: Intact   Attention span and concentration: Limited   Fund of knowledge, awareness of current events and vocabulary: Yes   Judgment: Yes   Insight: Limited       SAFETY EXAMINATION (if yes describe findings in detail)   Suicidality: No   Homicidality: No   Aggression toward others:No   Victim or at risk of aggression by others: No   Substance use/abuse (legal and illicit): No         Risk Level  Assessment  Risk Level Change (if yes, please describe): No    Suicide: low (1)  Violence: low (1)  Addiction: low (1)        Protective factors: Family connection, strong faith.       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   She has a history of chronic anxiety with panic attacks and depression.   The client has no prior history of psychiatric hospitalizations, mania, psychosis, safety issues, violence or substance abuse issues.   Past medical history includes: Essential hypertension, labyrinthitis, hyperlipidemia and chronic cough  There is a strong familial predisposition to psychiatric problems since family history is positive for depression and anxiety.   Psychiatric symptoms started in [place] in 1996 in the context of work related stress. They recently exacerbated due to immigration related problems.   Pt had been in treatment with Sertraline and Clonazepam since 2005. Trazodone was added during a previous visit and it has helped with insomnia.   She has been adamant about continuing with the same dose of Clonazepam, but she has benefited from splitting the dose from 2 mg QHS to 1 mg BID.   During her last visit in December 2019, she requested to replace Sertraline with Venlafaxine. She is reporting partial benefit with 75 mg. She is endorsing marked symptoms of anxiety, but no panic attacks.   She is also experiencing mild symptoms of depression, but no SI or hopelessness.   She continues to struggle with multiple psychosocial stressors. She has limited psychological coping skills.     She is asking to increase the dose of Venlafaxine.   She started psychotherapy tx with Mellody Memos, [facility]. She is  interested in continuing with psychotherapy treatment along with psychotherapy tx.       Patient's ethnic and cultural factors: Recent [place] immigrant, not able to speak [place].     Patient's religion and spiritual beliefs, values and preferences: Ephriam Knuckles, believes in God. Listens to [employer] and goes to church.        PLAN:  PLAN FOR MANAGING RISK (Consider risk plan for patients at moderate or high risk for suicide/violence/addiction; medication plan; referrals, etc. Must coincide with treatment plan.):   Not at moderate or hight risk.        PLAN:   Increase Venlafaxine from 75 to 112.5 mg daily (75 +37.5 mg).   Monitor blood pressure. Follow up with PCP for tx of blood pressure.   Continue Clonazepam, 1 mg BID for now.  Will continue to monitor appropriateness of Clonazepam.   Continue Trazodone, 50 mg QHS for sleep.   Continue psychotherapy tx. Per pt's request, asked daughter to schedule follow up visit and she agreed.   F/U in 4 weeks, call sooner if needed   Call 911 or proceed to the nearest ER in case symptoms worsen acutely or in the event of any other psychiatric emergency.  Collaborate with other providers as needed   Order complementary labs and tests as needed   PCP has been informed that pt may benefit from the support of Bobbe Medico, mental health care partner or Melvenia Needles, Patient Resources Coordinator.       INFORMED CONSENT (for any new treatment): Patient was informed of the potential risks and benefits of the treatment, including the option not to treat, and appeared to understand and agreed to comply. Discussion included the following key points:   Pt understands the benefits and side effects of Trazodone Clonazepam and Venlafaxine, including increased BP with Venlafaxine.   She consents fully.  REVIEWING TODAY'S VISIT  CLINICAL INTERVENTIONS TODAY:   -Provided medication adjustment-Reviewed medication adherence/tolerance.  -Performed mental status and risk  assessments.   -Provided psychoeducation, support.   -Explored ways of increasing psychological coping skills.       PATIENT'S RESPONSE TO INTERVENTIONS: Receptive     PROGRESS TOWARDS GOALS:  Slow progress     Estimated time spent in psychotherapy: N/A      FOR PRESCRIBERS and EVALUATION AND MANAGEMENT VISITS ONLY    Use only when no psychotherapy performed  COUNSELING AND COORDINATION OF CARE PROVIDED (Consider diagnostic results/impressions and/or recommended studies; risks and benefits of treatment options; instruction for management/treatment and/or follow-up; importance of compliance with chosen treatment options; risk factor reduction; patient/family/caregiver education; prognosis): Yes:       Over 50% of time during today's visit was devoted to counseling and/or coordination of care.  If yes, record estimated duration of the face to face encounter.    YES,  ESTIMATED TIME: 25 minutes     INSTRUCTIONS TO COVERING CLINICIANS:  OK to refill.    [name], APRN   Harrold Donath, APRN

## 2018-08-22 NOTE — Progress Notes (Signed)
Pt feels safe at home.  Sara Snyder, 08/22/2018

## 2018-08-23 LAB — HEMOGLOBIN A1C
ESTIMATED AVERAGE GLUCOSE: 120 (ref 74–160)
HEMOGLOBIN A1C: 5.8 % — ABNORMAL HIGH (ref 4.0–5.6)

## 2018-09-01 ENCOUNTER — Ambulatory Visit (HOSPITAL_BASED_OUTPATIENT_CLINIC_OR_DEPARTMENT_OTHER): Payer: Self-pay | Admitting: Internal Medicine

## 2018-09-10 ENCOUNTER — Encounter (HOSPITAL_BASED_OUTPATIENT_CLINIC_OR_DEPARTMENT_OTHER): Payer: Self-pay

## 2018-09-11 ENCOUNTER — Encounter (HOSPITAL_BASED_OUTPATIENT_CLINIC_OR_DEPARTMENT_OTHER): Payer: Self-pay | Admitting: Internal Medicine

## 2018-09-11 DIAGNOSIS — N1831 Chronic kidney disease, stage 3a: Secondary | ICD-10-CM | POA: Insufficient documentation

## 2018-09-11 DIAGNOSIS — N183 Chronic kidney disease, stage 3 unspecified: Secondary | ICD-10-CM

## 2018-09-11 DIAGNOSIS — R944 Abnormal results of kidney function studies: Secondary | ICD-10-CM

## 2018-09-11 HISTORY — DX: Chronic kidney disease, stage 3 unspecified: N18.30

## 2018-09-13 ENCOUNTER — Other Ambulatory Visit: Payer: Self-pay

## 2018-09-13 ENCOUNTER — Encounter (HOSPITAL_BASED_OUTPATIENT_CLINIC_OR_DEPARTMENT_OTHER): Payer: Self-pay | Admitting: Internal Medicine

## 2018-09-13 ENCOUNTER — Ambulatory Visit: Payer: No Typology Code available for payment source | Attending: Internal Medicine | Admitting: Internal Medicine

## 2018-09-13 DIAGNOSIS — J309 Allergic rhinitis, unspecified: Secondary | ICD-10-CM | POA: Diagnosis present

## 2018-09-13 DIAGNOSIS — R058 Other specified cough: Secondary | ICD-10-CM

## 2018-09-13 DIAGNOSIS — L659 Nonscarring hair loss, unspecified: Secondary | ICD-10-CM | POA: Insufficient documentation

## 2018-09-13 DIAGNOSIS — K219 Gastro-esophageal reflux disease without esophagitis: Secondary | ICD-10-CM

## 2018-09-13 DIAGNOSIS — M2550 Pain in unspecified joint: Secondary | ICD-10-CM

## 2018-09-13 DIAGNOSIS — R05 Cough: Secondary | ICD-10-CM | POA: Diagnosis present

## 2018-09-13 DIAGNOSIS — I10 Essential (primary) hypertension: Secondary | ICD-10-CM | POA: Insufficient documentation

## 2018-09-13 MED ORDER — RANITIDINE HCL 150 MG PO TABS
150.0000 mg | ORAL_TABLET | Freq: Every evening | ORAL | 11 refills | Status: DC
Start: 2018-09-13 — End: 2019-04-10

## 2018-09-13 MED ORDER — TRAZODONE HCL 50 MG PO TABS: 50 mg | tablet | Freq: Every evening | ORAL | 11 refills | 0 days | Status: AC

## 2018-09-13 MED ORDER — RANITIDINE HCL 150 MG PO TABS: 150 mg | tablet | Freq: Every evening | ORAL | 11 refills | 0 days | Status: AC

## 2018-09-13 MED ORDER — FLUTICASONE PROPIONATE 50 MCG/ACT NA SUSP
1.0000 | Freq: Every day | NASAL | 5 refills | Status: DC
Start: 2018-09-13 — End: 2019-08-18

## 2018-09-13 MED ORDER — ATENOLOL 50 MG PO TABS: 50 mg | tablet | Freq: Every day | ORAL | 6 refills | 0 days | Status: AC

## 2018-09-13 MED ORDER — CALCIUM CARBONATE-VITAMIN D 500-400 MG-UNIT PO TABS
1.00 | ORAL_TABLET | Freq: Two times a day (BID) | ORAL | 11 refills | Status: AC
Start: 2018-09-13 — End: 2019-09-14

## 2018-09-13 MED ORDER — ACETAMINOPHEN 500 MG PO TABS: 500 mg | tablet | Freq: Four times a day (QID) | ORAL | 6 refills | 0 days | Status: AC | PRN

## 2018-09-13 MED ORDER — VENLAFAXINE HCL ER 75 MG PO CP24: 75 mg | capsule | Freq: Every day | ORAL | 2 refills | 0 days | Status: AC

## 2018-09-13 MED ORDER — HAIR SKIN AND NAILS FORMULA PO TABS: 1 | tablet | Freq: Every day | 12 refills | 0 days | Status: AC

## 2018-09-13 MED ORDER — ATENOLOL 50 MG PO TABS
50.0000 mg | ORAL_TABLET | Freq: Every day | ORAL | 6 refills | Status: DC
Start: 2018-09-13 — End: 2018-12-15

## 2018-09-13 MED ORDER — CHLORTHALIDONE 25 MG PO TABS
25.0000 mg | ORAL_TABLET | Freq: Every day | ORAL | 6 refills | Status: DC
Start: 2018-09-13 — End: 2018-12-15

## 2018-09-13 MED ORDER — CALCIUM CARBONATE-VITAMIN D 500-400 MG-UNIT PO TABS: 1 | tablet | Freq: Two times a day (BID) | ORAL | 11 refills | 0 days | Status: AC

## 2018-09-13 MED ORDER — VALSARTAN 320 MG PO TABS: 320 mg | tablet | Freq: Every day | ORAL | 11 refills | 0 days | Status: AC

## 2018-09-13 MED ORDER — FLUTICASONE PROPIONATE 50 MCG/ACT NA SUSP: 1 | Bottle | Freq: Every day | NASAL | 5 refills | 0 days | Status: AC

## 2018-09-13 MED ORDER — VENLAFAXINE HCL ER 37.5 MG PO CP24: capsule | ORAL | 6 refills | 0 days | Status: AC

## 2018-09-13 MED ORDER — OMEPRAZOLE 20 MG PO CPDR: 20 mg | capsule | Freq: Every day | ORAL | 6 refills | 0 days | Status: AC

## 2018-09-13 MED ORDER — OMEPRAZOLE 20 MG PO CPDR
20.0000 mg | DELAYED_RELEASE_CAPSULE | Freq: Every day | ORAL | 6 refills | Status: DC
Start: 2018-09-13 — End: 2019-03-06

## 2018-09-13 MED ORDER — VENLAFAXINE HCL ER 37.5 MG PO CP24
ORAL_CAPSULE | ORAL | 6 refills | Status: DC
Start: 2018-09-13 — End: 2019-03-06

## 2018-09-13 MED ORDER — AZELASTINE HCL 0.1 % NA SOLN: 1 | mL | Freq: Two times a day (BID) | NASAL | 4 refills | 0 days | Status: AC

## 2018-09-13 MED ORDER — LORATADINE 10 MG PO TABS
10.0000 mg | ORAL_TABLET | Freq: Every day | ORAL | 11 refills | Status: DC
Start: 2018-09-13 — End: 2019-09-14

## 2018-09-13 MED ORDER — LORATADINE 10 MG PO TABS: 10 mg | tablet | Freq: Every day | ORAL | 11 refills | 0 days | Status: AC

## 2018-09-13 MED ORDER — ACETAMINOPHEN 500 MG PO TABS
500.00 mg | ORAL_TABLET | Freq: Four times a day (QID) | ORAL | 6 refills | Status: AC | PRN
Start: 2018-09-13 — End: 2019-09-14

## 2018-09-13 MED ORDER — VENLAFAXINE HCL ER 75 MG PO CP24
75.0000 mg | ORAL_CAPSULE | Freq: Every day | ORAL | 2 refills | Status: DC
Start: 2018-09-13 — End: 2018-12-12

## 2018-09-13 MED ORDER — VALSARTAN 320 MG PO TABS
320.0000 mg | ORAL_TABLET | Freq: Every day | ORAL | 11 refills | Status: DC
Start: 2018-09-13 — End: 2018-12-15

## 2018-09-13 MED ORDER — CHLORTHALIDONE 25 MG PO TABS: 25 mg | tablet | Freq: Every day | ORAL | 6 refills | 0 days | Status: AC

## 2018-09-13 MED ORDER — HAIR SKIN AND NAILS FORMULA PO TABS
1.0000 | ORAL_TABLET | Freq: Every day | ORAL | 12 refills | Status: DC
Start: 2018-09-13 — End: 2021-11-07

## 2018-09-13 MED ORDER — AZELASTINE HCL 0.1 % NA SOLN
1.00 | Freq: Two times a day (BID) | NASAL | 4 refills | Status: AC
Start: 2018-09-13 — End: 2019-09-14

## 2018-09-13 MED ORDER — TRAZODONE HCL 50 MG PO TABS
50.0000 mg | ORAL_TABLET | Freq: Every evening | ORAL | 11 refills | Status: DC
Start: 2018-09-13 — End: 2019-03-27

## 2018-09-13 MED FILL — FLUTICASONE SPR 50MCG: 30 days supply | Qty: 16 | Fill #0 | Status: CP

## 2018-09-13 MED FILL — ATENOLOL 50MG: 90 days supply | Qty: 90 | Fill #0 | Status: CP

## 2018-09-13 MED FILL — LORATADINE  10MG: 90 days supply | Qty: 90 | Fill #0 | Status: CP

## 2018-09-13 MED FILL — VENLAFAXINE 75MG ER: 90 days supply | Qty: 90 | Fill #0 | Status: CP

## 2018-09-13 MED FILL — HAIR SKIN & NAILS AD: 30 days supply | Qty: 30 | Fill #0 | Status: CP

## 2018-09-13 MED FILL — OMEPRAZOLE   20MG: 90 days supply | Qty: 90 | Fill #0 | Status: CP

## 2018-09-13 MED FILL — TRAZODONE  50MG: 90 days supply | Qty: 90 | Fill #0 | Status: CP

## 2018-09-13 MED FILL — AZELASTINE SPR 0.1%: 30 days supply | Qty: 30 | Fill #0 | Status: CP

## 2018-09-13 MED FILL — VENLAFAXINE  37.5MG ER: 90 days supply | Qty: 90 | Fill #0 | Status: CP

## 2018-09-13 MED FILL — *CHLORTHALID 25MG: 90 days supply | Qty: 90 | Fill #0 | Status: CP

## 2018-09-13 MED FILL — CALCIUM/VITD 500-400: 90 days supply | Qty: 180 | Fill #0 | Status: CP

## 2018-09-13 MED FILL — *ACETAMIN   500MG: 5 days supply | Qty: 30 | Fill #0 | Status: CP

## 2018-09-13 MED FILL — VALSARTAN 320MG: 90 days supply | Qty: 90 | Fill #0 | Status: CP

## 2018-09-13 NOTE — Progress Notes (Signed)
HPI  This patient was identified as meeting criteria for a televisit rather than an in person visit due to public health concerns around COVID-19. A complete assessment and plan is detailed in the note, all of which were conducted remotely using telephone technology.  Patient identity was verbally confirmed by the patient/guardian with 2 identifiers (name, date of birth, and/or address) at the beginning of the visit  Patient/guardian verbally consented to care by televisit as appropriate.   Patient/guardian was located home during the visit and confirmed that they understood they were encouraged to be in private location due to personal health information being discussed.  Patient/guardian was informed how to access face-to-face care in the event of an emergency.  Provider was located outside the office at a secure location during the visit.  If this is a new patient visit, all available records and medical history were reviewed by the provider.  Visit length was 20 minutes and counseling was done on the diagnoses indicated in the visit.            Patient presents with:  Follow Up: BMP  Refill Request    Visit for follow up of BP   She is doing well current regimen is workgn for her       Social History     Socioeconomic History    Marital status: Married     Spouse name: Not on file    Number of children: Not on file    Years of education: Not on file    Highest education level: Not on file   Occupational History    Not on file   Social Needs    Financial resource strain: Not on file    Food insecurity:     Worry: Not on file     Inability: Not on file    Transportation needs:     Medical: Not on file     Non-medical: Not on file   Tobacco Use    Smoking status: Former Smoker     Packs/day: 2.00     Years: 25.00     Pack years: 50.00     Last attempt to quit: 10/27/1977     Years since quitting: 40.9    Smokeless tobacco: Never Used   Substance and Sexual Activity    Alcohol use: Not Currently    Drug  use: Never    Sexual activity: Yes     Partners: Male   Lifestyle    Physical activity:     Days per week: Not on file     Minutes per session: Not on file    Stress: Not on file   Relationships    Social connections:     Talks on phone: Not on file     Gets together: Not on file     Attends religious service: Not on file     Active member of club or organization: Not on file     Attends meetings of clubs or organizations: Not on file     Relationship status: Not on file    Intimate partner violence:     Fear of current or ex partner: Not on file     Emotionally abused: Not on file     Physically abused: Not on file     Forced sexual activity: Not on file   Other Topics Concern    Not on file   Social History Narrative    Originally from [place], [place]  History of childhood trauma    Lives with family     Patient Active Problem List:     Essential hypertension     Labyrinthitis     Panic disorder     Hyperlipidemia     Chronic cough     Mild episode of recurrent major depressive disorder (HCC)     Allergic rhinitis     Need for prophylactic vaccination and inoculation against influenza     Left arm pain     Decreased GFR    Past Surgical History:  10/2016: HEMORRHOID SURGERY  No date: OB ANTEPARTUM CARE CESAREAN DLVR & POSTPARTUM  No family history on file.      Current Outpatient Medications:  omeprazole (PRILOSEC) 20 MG capsule Take 1 capsule by mouth daily Disp: 30 capsule Rfl: 6   venlafaxine (EFFEXOR-XR) 37.5 MG 24 hr capsule Take 1 capsule daily with 75 mg. TDD=112.5MG  Disp: 30 capsule Rfl: 6   valsartan (DIOVAN) 320 MG tablet Take 1 tablet by mouth daily Disp: 30 tablet Rfl: 11   traZODone (DESYREL) 50 MG tablet Take 1 tablet by mouth nightly Disp: 30 tablet Rfl: 11   raNITIdine (ZANTAC) 150 MG tablet Take 1 tablet by mouth nightly Disp: 30 tablet Rfl: 11   Multiple Vitamins-Minerals (HAIR SKIN AND NAILS FORMULA) TABS Take 1 capsule by mouth daily Disp: 30 tablet Rfl: 12   loratadine (CLARITIN) 10 MG  tablet Take 1 tablet by mouth daily Disp: 30 tablet Rfl: 11   chlorthalidone (HYGROTEN) 25 MG tablet Take 1 tablet by mouth daily Disp: 90 tablet Rfl: 6   calcium-vitamin D (OSCAL-500) 500-400 MG-UNIT per tablet Take 1 tablet by mouth 2 (two) times daily Disp: 60 tablet Rfl: 11   azelastine (ASTELIN) 0.1 % nasal spray 1 spray by Each Nostril route 2 (two) times daily Use in each nostril as directed Disp: 30 mL Rfl: 4   atenolol (TENORMIN) 50 MG tablet Take 1 tablet by mouth daily Disp: 90 tablet Rfl: 6   acetaminophen (TYLENOL) 500 MG tablet Take 1 tablet by mouth every 6 (six) hours as needed for Pain Disp: 30 tablet Rfl: 6   fluticasone (FLONASE) 50 MCG/ACT nasal spray 1 spray by Each Nostril route daily Disp: 1 Bottle Rfl: 5   venlafaxine (EFFEXOR-XR) 75 MG 24 hr capsule Take 1 capsule by mouth daily Disp: 30 capsule Rfl: 2   clonazePAM (KLONOPIN) 1 MG tablet Take 1 tablet by mouth 2 (two) times daily Disp: 60 tablet Rfl: 2   fluticasone (FLOVENT HFA) 110 MCG/ACT inhaler Inhale 1 puff into the lungs 2 (two) times daily  for 10 days Disp: 1 Inhaler Rfl: 0     No current facility-administered medications for this visit.   Review of Patient's Allergies indicates:   Pollen extract          Cough  ROS              Physical Exam              (K21.9) Chronic GERD  Comment:   Plan: omeprazole (PRILOSEC) 20 MG capsule, raNITIdine        (ZANTAC) 150 MG tablet           (I10) Essential hypertension  Comment:   Plan: valsartan (DIOVAN) 320 MG tablet,         chlorthalidone (HYGROTEN) 25 MG tablet,         atenolol (TENORMIN) 50 MG tablet            (  L65.9) Hair loss  Comment:   Plan: Multiple Vitamins-Minerals (HAIR SKIN AND NAILS        FORMULA) TABS           (M25.50) Multiple joint pain  Comment:   Plan: calcium-vitamin D (OSCAL-500) 500-400 MG-UNIT         per tablet, acetaminophen (TYLENOL) 500 MG         tablet            (J30.9) Allergic rhinitis, unspecified seasonality, unspecified trigger  Comment:   Plan:  azelastine (ASTELIN) 0.1 % nasal spray           (R05) Allergic cough  Comment:  Plan: fluticasone (FLONASE) 50 MCG/ACT nasal spray           We discussed the importance of medication compliance. The patient was ready to learn and no apparent learning barriers were identified. I explained the diagnosis and treatment plan, and the patient expressed understanding of the content. Possible side effects of the prescribed medication(s) were explained, .  I attempted to answer any questions regarding the diagnosis and the proposed treatment.    We discussed the patients current medications. The patient expressed understanding and no barriers to adherence were identified.      Overton Mam, MD

## 2018-09-13 NOTE — Progress Notes (Signed)
Pt feels safe at home.  Sara Snyder, [name], 09/13/2018

## 2018-09-21 ENCOUNTER — Ambulatory Visit: Payer: No Typology Code available for payment source

## 2018-09-21 DIAGNOSIS — F330 Major depressive disorder, recurrent, mild: Secondary | ICD-10-CM | POA: Diagnosis not present

## 2018-09-21 DIAGNOSIS — F410 Panic disorder [episodic paroxysmal anxiety] without agoraphobia: Secondary | ICD-10-CM

## 2018-09-21 MED ORDER — CLONAZEPAM 1 MG PO TABS: 1 mg | tablet | Freq: Two times a day (BID) | ORAL | 2 refills | 0 days | Status: AC

## 2018-09-21 MED ORDER — CLONAZEPAM 1 MG PO TABS
1.0000 mg | ORAL_TABLET | Freq: Two times a day (BID) | ORAL | 2 refills | Status: DC
Start: 2018-09-21 — End: 2019-03-06

## 2018-09-21 MED FILL — CLONAZEPAM 1MG: 30 days supply | Qty: 60 | Fill #0 | Status: CP

## 2018-09-21 NOTE — Progress Notes (Signed)
This patient was identified as meeting criteria for a televisit rather than an in person visit due to public health concerns around COVID-19. A complete assessment and plan is detailed in the note, all of which were conducted remotely using telephone technology.  Patient identity was verbally confirmed by the patient/guardian with 2 identifiers (name and date of birth) at the beginning of the visit  Patient verbally consented to care by televisit as appropriate.   Patient was located at home during the visit and confirmed that she understood they she was encouraged to be in a private location due to personal health information being discussed.  Patient was informed how to access face-to-face care in the event of an emergency.  Provider was located outside the office at a secure location during the visit.  Visit length was 20 minutes and counseling was done on the diagnoses indicated in the visit.        OUTPATIENT PSYCHIATRY PROGRESS NOTE      INTERPRETER: None needed.  INTERPRETER NAME: N/A      CONTACT INFO FOR OTHER AGENCIES AND MENTAL HEALTH PROVIDERS:   PCP: Overton Mam, MD  Psychotherapist: Mellody Memos, [facility]        FOCUS OF CURRENT SESSION:  Problem 1: Depression   Problem 2: Anxiety / panic attacks   Problem 3: Insomnia       SUBJECTIVE:  TODAY'S CHIEF COMPLAINT AND CLINICAL UPDATES IN PATIENT'S WORDS:  1) Chief Complaint:  "I am feeling less anxious, but I am still worried about the coronavirus"       2) new information from patient and/or collateral:  During our last visit, the dose of Venlafaxine was increased from 75 to 112 mg daily.   She has been taking it along with Trazodone and Clonazepam.   She reported decreased symptoms of anxiety. She is no longer having panic attacks.   She is sleeping well with Trazodone. She feels sad but she denied major symptoms of depression, SI and hopelessness.   She is worried about the coronavirus, but she is taking precautions not to get sick.   Her  husband is currently unemployed. Although they are struggling financially, she is glad that he is home since he had kidney surgery last year.      Pt gives permission for her to participate fully in the session. Ms. Donzetta Sprung helped to co-author this note.   During our last visit in December, pt asked to replace Sertraline with Venlafaxine. Her daughters had recommended treatment with Venlafaxine since it was effective for her.   She discontinued Sertraline successfully. The dose of Venlafaxine was titrated to 75 mg daily.   Pt reports that Venlafaxine has been partially effective for anxiety. She continues taking 2 mg of Clonazepam along with 50 mg of Trazodone.   She denies having panic attacks, but she continues to feel very anxious. Her husband and daughter have noticed her anxiety as well.    She is requesting to increase the dose of Venlafaxine.     She is sleeping well with Trazodone. Her appetite is normal.   She is also experiencing symptoms of depression, but she denied SI and hopelessness.   She is experiencing some psychosocial stressors.   She reported living in a basement that is too cold and she thinks that this is causing her feet to swell.   She reported that she is worried about her health and this is causing her increased stress and anxiety.   She has not made  an appointment with her PCP because she relies on her daughter to accompany her to the appointments and her daughter has been very busy with work.   She reported that her daughter does not teach her how to take public transportation, but she would like to learn.    Her husband is working at night now, Doctor, hospital. Her husband had surgery to remove his right kidney due to cancer last year. He is recovering well.   She reported feeling sad since she doesn't know the whereabout of her son. She hasn't had any contact with him for the last 2 years. She thinks that he has problems with drugs and alcohol.     She had one psychotherapy visit  with Mellody Memos, [facility]. Her daughter had agreed to schedule a follow up visit with him, but she didn't schedule one. She agreed that we speak with her daughter about this.       OBJECTIVE:  DATA REVIEWED: Chart     Most Recent Weight Reading(s)  08/22/18 : 83.5 kg (184 lb)  06/29/18 : 81.2 kg (179 lb)  05/23/18 : 80.7 kg (178 lb)  02/07/18 : 81.6 kg (180 lb)  12/20/17 : 80.7 kg (178 lb)      Most Recent BP Reading(s)  08/22/18 : 133/78  06/29/18 : (!) 152/86  05/23/18 : (!) 154/71  02/07/18 : (!) 165/77  12/20/17 : 133/76       CURRENT MEDICATIONS:    omeprazole (PRILOSEC) 20 MG capsule, Take 1 capsule by mouth daily, Disp: 30 capsule, Rfl: 6    Venlafaxine (EFFEXOR-XR) 37.5 MG 24 hr capsule, Take 1 capsule daily with 75 mg. TDD=112.5MG , Disp: 30 capsule, Rfl: 6    valsartan (DIOVAN) 320 MG tablet, Take 1 tablet by mouth daily, Disp: 30 tablet, Rfl: 11    TraZODone (DESYREL) 50 MG tablet, Take 1 tablet by mouth nightly, Disp: 30 tablet, Rfl: 11    raNITIdine (ZANTAC) 150 MG tablet, Take 1 tablet by mouth nightly, Disp: 30 tablet, Rfl: 11    Multiple Vitamins-Minerals (HAIR SKIN AND NAILS FORMULA) TABS, Take 1 capsule by mouth daily, Disp: 30 tablet, Rfl: 12    loratadine (CLARITIN) 10 MG tablet, Take 1 tablet by mouth daily, Disp: 30 tablet, Rfl: 11    chlorthalidone (HYGROTEN) 25 MG tablet, Take 1 tablet by mouth daily, Disp: 90 tablet, Rfl: 6    calcium-vitamin D (OSCAL-500) 500-400 MG-UNIT per tablet, Take 1 tablet by mouth 2 (two) times daily, Disp: 60 tablet, Rfl: 11    azelastine (ASTELIN) 0.1 % nasal spray, 1 spray by Each Nostril route 2 (two) times daily Use in each nostril as directed, Disp: 30 mL, Rfl: 4    atenolol (TENORMIN) 50 MG tablet, Take 1 tablet by mouth daily, Disp: 90 tablet, Rfl: 6    acetaminophen (TYLENOL) 500 MG tablet, Take 1 tablet by mouth every 6 (six) hours as needed for Pain, Disp: 30 tablet, Rfl: 6    fluticasone (FLONASE) 50 MCG/ACT nasal spray, 1 spray by Each  Nostril route daily, Disp: 1 Bottle, Rfl: 5    venlafaxine (EFFEXOR-XR) 75 MG 24 hr capsule, Take 1 capsule by mouth daily, Disp: 30 capsule, Rfl: 2    ClonazePAM (KLONOPIN) 1 MG tablet, Take 1 tablet by mouth 2 (two) times daily, Disp: 60 tablet, Rfl: 2    fluticasone (FLOVENT HFA) 110 MCG/ACT inhaler, Inhale 1 puff into the lungs 2 (two) times daily  for 10 days, Disp: 1 Inhaler, Rfl:  0      MEDICATION ADHERENCE (including barriers and how addressed): Yes.    MEDICATIONS SIDE EFFECTS: None    PAST MEDICATIONS:    Fluoxetine  Sertraline-Not effective     BIRTH CONTROL (ask females and males): N/A    CURRENT PREGNANCY: N/A    AIMS: N/A       MENTAL STATUS EXAMINATION                General Appearance: Not observed   Interaction with Interviewer (eye contact, attitude, behavior): Yes, very communicative.   Physical Signs   Gait and Station (how patient walks and stands): Not observed   Physical Appearance: Not observed   Normal Movements: Not observed   Speech (rate, volume, articulation): Very talkative and loud at times  Language: Within normal limits, speaking in [place]  Mood: "Worried"   Affect: Congruent   Thought Process: Logical   Thought Content:  Tangential   Perceptions: No hallucinations   Impulse Control: Good   Cognition (Link to [facility])   Orientation (person, place, time): Yes   Recent and remote memory: Intact   Attention span and concentration: Impaired   Fund of knowledge, awareness of current events and vocabulary: Yes   Judgment: Yes   Insight: Limited       SAFETY EXAMINATION (if yes describe findings in detail)   Suicidality: No   Homicidality: No   Aggression toward others:No   Victim or at risk of aggression by others: No   Substance use/abuse (legal and illicit): No         Risk Level Assessment  Risk Level Change (if yes, please describe): No    Suicide: low (1)  Violence: low (1)  Addiction: low (1)        Protective factors: Family connection, strong faith.       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   She has a history of chronic anxiety with panic attacks and depression.   The client has no prior history of psychiatric hospitalizations, mania, psychosis, safety issues, violence or substance abuse issues.   Past medical history includes: Essential hypertension, labyrinthitis, hyperlipidemia and chronic cough  There is a strong familial predisposition to psychiatric problems since family history is positive for depression and anxiety.   Psychiatric symptoms started in [place] in 1996 in the context of work related stress. They recently exacerbated due to immigration related problems.   Pt had been in treatment with Sertraline and Clonazepam since 2005. Trazodone was added during a previous visit and it has helped with insomnia.   She has been adamant about continuing with the same dose of Clonazepam, but she has benefited from splitting the dose from 2 mg QHS to 1 mg BID.   During her visit in December 2019, she requested to replace Sertraline with Venlafaxine. Symptoms of anxiety and depression have improved with 112.5 mg of Venlafaxine along with Clonazepam and Trazodone for sleep.   Presently, she is worried and mildly depressed, but she denied panic attacks, SI or hopelessness.   She continues to struggle with multiple psychosocial stressors. She has limited psychological coping skills.     She started psychotherapy tx with Mellody Memos, [facility]  recently.     Patient's ethnic and cultural factors: Recent [place] immigrant, not able to speak [place].     Patient's religion and spiritual beliefs, values and preferences: Ephriam KnucklesChristian, believes in God. Listens to [employer] music and goes to  church.        PLAN:  PLAN FOR MANAGING RISK (Consider risk plan for patients at moderate or high risk for suicide/violence/addiction; medication plan; referrals, etc. Must coincide with treatment plan.):   Not at moderate or hight risk.        PLAN:   Continue Venlafaxine, 112.5 mg daily (75 +37.5 mg).  Monitor blood pressure. Follow up with PCP for tx of blood pressure.   Continue Clonazepam, 1 mg BID for now.  Will continue to monitor appropriateness of Clonazepam.   Continue Trazodone, 50 mg QHS for sleep.   [facility]   F/U in 4 weeks, call sooner if needed   Call 911 or proceed to the nearest ER in case symptoms worsen acutely or in the event of any other psychiatric emergency.  Collaborate with other providers as needed   Order complementary labs and tests as needed   PCP has been informed that pt may benefit from the support of Bobbe MedicoSandra Campos, mental health care partner or Melvenia NeedlesMaria Alice Smolka, Patient Resources Coordinator.       INFORMED CONSENT (for any new treatment): Patient was informed of the potential risks and benefits of the treatment, including the option not to treat, and appeared to understand and agreed to comply. Discussion included the following key points:   Pt understands the benefits and side effects of Trazodone, Clonazepam and Venlafaxine, including increased BP with Venlafaxine.   She consents fully.       REVIEWING TODAY'S VISIT  CLINICAL INTERVENTIONS TODAY:   -Provided medication adjustment-Reviewed medication adherence/tolerance.  -Performed mental status and risk assessments.   -Provided psychoeducation, support.   -Explored ways of increasing psychological coping skills.   -Continued to encourage pt to take the appropriate steps to avoid getting sick with the coronavirus.       PATIENT'S RESPONSE TO INTERVENTIONS: Receptive     PROGRESS TOWARDS GOALS:  Some progress     Estimated time spent in psychotherapy: N/A      FOR PRESCRIBERS and  EVALUATION AND MANAGEMENT VISITS ONLY    Use only when no psychotherapy performed  COUNSELING AND COORDINATION OF CARE PROVIDED (Consider diagnostic results/impressions and/or recommended studies; risks and benefits of treatment options; instruction for management/treatment and/or follow-up; importance of compliance with chosen treatment options; risk factor reduction; patient/family/caregiver education; prognosis): Yes:       Over 50% of time during today's visit was devoted to counseling and/or coordination of care.  If yes, record estimated duration of the face to face encounter.    YES,  ESTIMATED TIME: 20 minutes     INSTRUCTIONS TO COVERING CLINICIANS:  OK to refill.    Noralyn PickEdilia Lyrika Souders, APRN
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
## 2018-10-19 ENCOUNTER — Other Ambulatory Visit (HOSPITAL_BASED_OUTPATIENT_CLINIC_OR_DEPARTMENT_OTHER): Payer: Self-pay

## 2018-10-19 ENCOUNTER — Telehealth (HOSPITAL_BASED_OUTPATIENT_CLINIC_OR_DEPARTMENT_OTHER): Payer: Self-pay

## 2018-10-19 ENCOUNTER — Ambulatory Visit (HOSPITAL_BASED_OUTPATIENT_CLINIC_OR_DEPARTMENT_OTHER): Payer: No Typology Code available for payment source

## 2018-10-19 NOTE — Progress Notes (Signed)
Called patient multiple times on all phone numbers listed on her chart. Left message instructing her to call the clinic at [phone] to reschedule to next week or later since I am not available this week.   Reminded her that she has an appt with her therapist, Mellody Memos, [facility] today at 1 pm.   Reviewed her chart. She does not need any refills at this time.   Sent message about this to the schedulers and her therapist.    [name], APRN

## 2018-10-19 NOTE — Progress Notes (Signed)
Unable to reach patient at [phone]  Provider called 3 times and then left message for rescheduling missed appt   Could not leave message at pt's daughter phone [phone]

## 2018-11-10 ENCOUNTER — Ambulatory Visit (HOSPITAL_BASED_OUTPATIENT_CLINIC_OR_DEPARTMENT_OTHER): Payer: No Typology Code available for payment source | Admitting: Internal Medicine

## 2018-11-10 ENCOUNTER — Ambulatory Visit (HOSPITAL_BASED_OUTPATIENT_CLINIC_OR_DEPARTMENT_OTHER): Payer: No Typology Code available for payment source

## 2018-11-10 ENCOUNTER — Other Ambulatory Visit: Payer: Self-pay

## 2018-11-10 ENCOUNTER — Other Ambulatory Visit (HOSPITAL_BASED_OUTPATIENT_CLINIC_OR_DEPARTMENT_OTHER): Payer: Self-pay

## 2018-11-10 NOTE — Televisit Note (Signed)
Called x2   Left message to call us back

## 2018-11-10 NOTE — Progress Notes (Signed)
Patient left without being seen.

## 2018-11-10 NOTE — Progress Notes (Signed)
Pt missed Televisit with myself and her therapist on 10/19/2018.   Messages were left on her voice mail asking her to call the clinic and reschedule the missed appointments, but she didn't reschedule them.   One of the patient's daughters called today asking to speak with me and an emergency televisit was scheduled for 1 pm.   I called but patient was not available. I left a voice mail message asking her to call the scheduling office at [phone] to reschedule.      Pt is scheduled with her PCP today at 3:40. I will send this note to her PCP.   Pt may be offered an appt with her therapist tomorrow.     [name], APRN

## 2018-11-11 ENCOUNTER — Other Ambulatory Visit: Payer: Self-pay

## 2018-11-11 ENCOUNTER — Encounter (HOSPITAL_BASED_OUTPATIENT_CLINIC_OR_DEPARTMENT_OTHER): Payer: Self-pay | Admitting: Internal Medicine

## 2018-11-11 ENCOUNTER — Ambulatory Visit: Payer: No Typology Code available for payment source | Attending: Internal Medicine | Admitting: Physician Assistant

## 2018-11-11 ENCOUNTER — Encounter (HOSPITAL_BASED_OUTPATIENT_CLINIC_OR_DEPARTMENT_OTHER): Payer: Self-pay | Admitting: Physician Assistant

## 2018-11-11 ENCOUNTER — Encounter (HOSPITAL_BASED_OUTPATIENT_CLINIC_OR_DEPARTMENT_OTHER): Payer: Self-pay

## 2018-11-11 DIAGNOSIS — F410 Panic disorder [episodic paroxysmal anxiety] without agoraphobia: Secondary | ICD-10-CM | POA: Diagnosis present

## 2018-11-11 DIAGNOSIS — I10 Essential (primary) hypertension: Secondary | ICD-10-CM

## 2018-11-11 NOTE — Televisit Note (Signed)
This patient was identified as meeting criteria for a televisit rather than an in person visit due to public health concerns around COVID-19. A complete assessment and plan is detailed in the note, all of which were conducted remotely using telephone technology.  Patient identity was verbally confirmed by the patient/guardian with 2 identifiers (name, date of birth, and/or address) at the beginning of the visit  Patient/guardian verbally consented to care by televisit as appropriate.   Patient/guardian was located at home during the visit and confirmed that they understood they were encouraged to be in private location due to personal health information being discussed.  Patient/guardian was informed how to access face-to-face care in the event of an emergency.  Provider was located outside the office at a secure location during the visit.  If this is a new patient visit, all available records and medical history were reviewed by the provider.  Visit length was 30 minutes and counseling was done on the diagnoses indicated in the visit.            Subjective   PI Sara Snyder [id number], Sara Snyder is a 63 year old female presents for f/u HTN, had f/u with PCP 2 mo ago and things were reportedly well controlled. She is "worried, nervous" about pandemic which she thinks is causing BPs to be elevated. At home readings have been high,variable, worse at nighttime.   10/5  15/7  16/8  17/9    Checking BPs readings ~ 3-5 times daily "everytime I feel something strange"    Most Recent BP Reading(s)  08/22/18 : 133/78  06/29/18 : (!) 152/86  05/23/18 : (!) 154/71  02/07/18 : (!) 165/77  12/20/17 : 133/76    Pt takes atenolol 50mg  daily, chlorthalidone 25mg  daily, valsartan 320mg  daily  BMP 08/22/18 normal except mildly elevated creatinine    Anxiety - on effexor 75+ 37.5 mg ER daily, klonopin BID. Admits being "very nervous".  Having panic attacks x 2 in past 1 week. Pt established with psych, [facility] app[facility]  yesterday    Worried about cholesterol, blood sugars.       Patient Active Problem List:     Essential hypertension     Labyrinthitis     Panic disorder     Hyperlipidemia     Chronic cough     Mild episode of recurrent major depressive disorder (HCC)     Allergic rhinitis     Need for prophylactic vaccination and inoculation against influenza     Left arm pain     Decreased GFR      Current Outpatient Medications:  clonazePAM (KLONOPIN) 1 MG tablet Take 1 tablet by mouth 2 (two) times daily Disp: 60 tablet Rfl: 2   omeprazole (PRILOSEC) 20 MG capsule Take 1 capsule by mouth daily Disp: 30 capsule Rfl: 6   venlafaxine (EFFEXOR-XR) 37.5 MG 24 hr capsule Take 1 capsule daily with 75 mg. TDD=112.5MG  Disp: 30 capsule Rfl: 6   valsartan (DIOVAN) 320 MG tablet Take 1 tablet by mouth daily Disp: 30 tablet Rfl: 11   traZODone (DESYREL) 50 MG tablet Take 1 tablet by mouth nightly Disp: 30 tablet Rfl: 11   raNITIdine (ZANTAC) 150 MG tablet Take 1 tablet by mouth nightly Disp: 30 tablet Rfl: 11   Multiple Vitamins-Minerals (HAIR SKIN AND NAILS FORMULA) TABS Take 1 capsule by mouth daily Disp: 30 tablet Rfl: 12   loratadine (CLARITIN) 10 MG tablet Take 1 tablet by mouth daily  Disp: 30 tablet Rfl: 11   chlorthalidone (HYGROTEN) 25 MG tablet Take 1 tablet by mouth daily Disp: 90 tablet Rfl: 6   calcium-vitamin D (OSCAL-500) 500-400 MG-UNIT per tablet Take 1 tablet by mouth 2 (two) times daily Disp: 60 tablet Rfl: 11   azelastine (ASTELIN) 0.1 % nasal spray 1 spray by Each Nostril route 2 (two) times daily Use in each nostril as directed Disp: 30 mL Rfl: 4   atenolol (TENORMIN) 50 MG tablet Take 1 tablet by mouth daily Disp: 90 tablet Rfl: 6   acetaminophen (TYLENOL) 500 MG tablet Take 1 tablet by mouth every 6 (six) hours as needed for Pain Disp: 30 tablet Rfl: 6   fluticasone (FLONASE) 50 MCG/ACT nasal spray 1 spray by Each Nostril route daily Disp: 1 Bottle Rfl: 5   venlafaxine (EFFEXOR-XR) 75 MG 24 hr capsule Take 1 capsule by  mouth daily Disp: 30 capsule Rfl: 2   fluticasone (FLOVENT HFA) 110 MCG/ACT inhaler Inhale 1 puff into the lungs 2 (two) times daily  for 10 days Disp: 1 Inhaler Rfl: 0     No current facility-administered medications for this visit.   Review of Patient's Allergies indicates:   Pollen extract          Cough  Social History     Socioeconomic History    Marital status: Married     Spouse name: Not on file    Number of children: Not on file    Years of education: Not on file    Highest education level: Not on file   Occupational History    Not on file   Social Needs    Financial resource strain: Not on file    Food insecurity:     Worry: Not on file     Inability: Not on file    Transportation needs:     Medical: Not on file     Non-medical: Not on file   Tobacco Use    Smoking status: Former Smoker     Packs/day: 2.00     Years: 25.00     Pack years: 50.00     Last attempt to quit: 10/27/1977     Years since quitting: 41.0    Smokeless tobacco: Never Used   Substance and Sexual Activity    Alcohol use: Not Currently    Drug use: Never    Sexual activity: Yes     Partners: Male   Lifestyle    Physical activity:     Days per week: Not on file     Minutes per session: Not on file    Stress: Not on file   Relationships    Social connections:     Talks on phone: Not on file     Gets together: Not on file     Attends religious service: Not on file     Active member of club or organization: Not on file     Attends meetings of clubs or organizations: Not on file     Relationship status: Not on file    Intimate partner violence:     Fear of current or ex partner: Not on file     Emotionally abused: Not on file     Physically abused: Not on file     Forced sexual activity: Not on file   Other Topics Concern    Not on file   Social History Narrative    [place]    History  of childhood trauma    Lives with family     Past Surgical History:  10/2016: HEMORRHOID SURGERY  No date: OB ANTEPARTUM CARE  CESAREAN DLVR & POSTPARTUM  No family history on file.           Objective     All ROS reviewed and discussed and are otherwise negative unless listed above.       PHYSICAL EXAM:  Pt sounds comfortable on phone, speaking in full sentences without cough, sob or wheezing. Anxiety, talks over provider and interpretor frequently.         A/P:  1. Essential hypertension  Variable readings, elevated likely 2/2 anxiety, pt to f/u with psych, has appt 820am 5/26 for this.  Continue current medications, encouraged to STOP checking BPs for the time being as seem to be exacerbating her anxiety and causing higher readings. Attn: DASH diet, hydration, exercise. F/u PCP 2 weeks.     2. Panic disorder  Extensive pt reassure provided, encouraged to stop, pause, breathe when feeling panic attacks. Encouraged meditation, pt states she is not able to do this. Emphasized importance of being available for psych visit next Tuesday AM.         I have reviewed the past medical, surgical, social and family history and updated these sections of EpicCare as relevant. All interim labs, test results, and consult notes were reviewed and discussed with patient. Medications were reconciled during this visit and a current medication list was given to the patient at the end of the visit.      follow up as above    [name], PA-C

## 2018-11-12 ENCOUNTER — Other Ambulatory Visit (HOSPITAL_BASED_OUTPATIENT_CLINIC_OR_DEPARTMENT_OTHER): Payer: Self-pay

## 2018-11-12 DIAGNOSIS — F410 Panic disorder [episodic paroxysmal anxiety] without agoraphobia: Secondary | ICD-10-CM

## 2018-11-12 DIAGNOSIS — F330 Major depressive disorder, recurrent, mild: Secondary | ICD-10-CM

## 2018-11-12 NOTE — Progress Notes (Signed)
Due to patient's age, referral is placed to transfer psychopharm care to the geri-psych department.   Pt will be informed about this change during our upcoming visit.     [name], APRN

## 2018-11-15 ENCOUNTER — Encounter (HOSPITAL_BASED_OUTPATIENT_CLINIC_OR_DEPARTMENT_OTHER): Payer: Self-pay

## 2018-11-15 ENCOUNTER — Other Ambulatory Visit (HOSPITAL_BASED_OUTPATIENT_CLINIC_OR_DEPARTMENT_OTHER): Payer: Self-pay

## 2018-11-15 ENCOUNTER — Telehealth (HOSPITAL_BASED_OUTPATIENT_CLINIC_OR_DEPARTMENT_OTHER): Payer: Self-pay

## 2018-11-15 ENCOUNTER — Ambulatory Visit (HOSPITAL_BASED_OUTPATIENT_CLINIC_OR_DEPARTMENT_OTHER): Payer: No Typology Code available for payment source

## 2018-11-15 NOTE — Progress Notes (Signed)
LVM to schedule a televisit with PCP Re: HTN. Await call back.    Sara Snyder, 11/15/2018         [name], PA-C  P Ec Support Staff Pool            Please schedule f/u 2 weeks with PCP for HTN

## 2018-11-15 NOTE — Progress Notes (Addendum)
Called pt twice in the morning, but she was not available for her scheduled Televisit.   Left message on her voice mail as well as sent MyChart message offering to reschedule to today at 2:40.   Pt may also call the scheduling office at [phone] to book another Televisit.    Daughter [name] replied to MyChart message asking that I call pt on her husband's phone [phone].   At 2:40, called [phone] as requested, but no one answered the phone. Left voice mail message. Also called pt's number at [phone] and no one answered this number either. Left voice mail message. Also sent MyChart message asking daughter to call and schedule an appt once pt has a workable phone.     [name], APRN

## 2018-11-17 ENCOUNTER — Ambulatory Visit: Payer: No Typology Code available for payment source

## 2018-11-17 DIAGNOSIS — F410 Panic disorder [episodic paroxysmal anxiety] without agoraphobia: Secondary | ICD-10-CM | POA: Insufficient documentation

## 2018-11-18 NOTE — Televisit Note (Signed)
OUTPATIENT PSYCHIATRY PROGRESS NOTE    INTERPRETER: No    CONTACT INFO FOR OTHER AGENCIES AND MENTAL HEALTH PROVIDERS (IF APPLICABLE):   Sara Pick, APRN     PROBLEM(S) ADDRESSED IN THIS SESSION:   1- Anxiety with panic   2- Depression  3- Stress related to Covid-19 pandemic       SUBJECTIVE  TODAY'S CHIEF COMPLAINT AND CLINICAL UPDATES IN PATIENT'S WORDS:  1) Chief Complaint (Patient and/or guardian's own words, concerns and expressed thoughts):   "I've been having anxiety crisis..."     2) New information from patient and/or collateral (Patient's illness: context, course, modifying factors, severity, cultural, family, social, medical history):   Patient is a 63-y-o married [name] who has a hx of depression and anxiety with panic attacks   Pt's symptoms worsened when she immigrated to the [name] last year together with her 70-y-o husband to join her daughter here   Pt continues having a difficult transition here due to language barrier, unemployment, finance, husband's health problem (resolved now- kidney cancer) and currently exacerbated by Covid-19 pandemic   Pt reported having frequent panic attacks last week, including chest pain, difficult breathing, sweating,  body shaking, dry mouth, nausea, vomiting, crying spells, and fear of getting sick with coronavirus disease.   She reported that she thinks about Covid-19 all the time, and can't fall asleep during the night (falls asleep between 2:30-4:30 AM).   Pt is currently taking care of her two grandchildren (ages 14 and 55) and lives with husband in their daughter's basement   She complains of not receiving doctor's call for Televisit, and it appears that pt is having problem with her cell phone (the incoming calls go directly to her voice mail box). This provider was able to get in touch with pt via daughter's cell phone   Pt is currently on med but feels that it not working   She denied any acute safety issues in the house.        OBJECTIVE  DATA REVIEWED  (Consider medical labs, radiology, other medical tests; screening/outcome measures; psychological testing; discussion of test results with other clinicians; consultation with other clinicians and systems involved with patient, summary of old records): Chart     CURRENT MEDICATIONS (make clear medications prescribed by psychiatry; include OTC medications):  Current Outpatient Medications   Medication Sig    clonazePAM (KLONOPIN) 1 MG tablet Take 1 tablet by mouth 2 (two) times daily    omeprazole (PRILOSEC) 20 MG capsule Take 1 capsule by mouth daily    venlafaxine (EFFEXOR-XR) 37.5 MG 24 hr capsule Take 1 capsule daily with 75 mg. TDD=112.5MG     valsartan (DIOVAN) 320 MG tablet Take 1 tablet by mouth daily    traZODone (DESYREL) 50 MG tablet Take 1 tablet by mouth nightly    raNITIdine (ZANTAC) 150 MG tablet Take 1 tablet by mouth nightly    Multiple Vitamins-Minerals (HAIR SKIN AND NAILS FORMULA) TABS Take 1 capsule by mouth daily    loratadine (CLARITIN) 10 MG tablet Take 1 tablet by mouth daily    chlorthalidone (HYGROTEN) 25 MG tablet Take 1 tablet by mouth daily    calcium-vitamin D (OSCAL-500) 500-400 MG-UNIT per tablet Take 1 tablet by mouth 2 (two) times daily    azelastine (ASTELIN) 0.1 % nasal spray 1 spray by Each Nostril route 2 (two) times daily Use in each nostril as directed    atenolol (TENORMIN) 50 MG tablet Take 1 tablet by mouth daily  acetaminophen (TYLENOL) 500 MG tablet Take 1 tablet by mouth every 6 (six) hours as needed for Pain    fluticasone (FLONASE) 50 MCG/ACT nasal spray 1 spray by Each Nostril route daily    venlafaxine (EFFEXOR-XR) 75 MG 24 hr capsule Take 1 capsule by mouth daily    fluticasone (FLOVENT HFA) 110 MCG/ACT inhaler Inhale 1 puff into the lungs 2 (two) times daily  for 10 days     No current facility-administered medications for this visit.        MEDICATION ADHERENCE (including barriers and how addressed):Yes    MEDICATION SIDE EFFECTS (Prescribers  Only): N/A    BIRTH CONTROL (ask females and males): N/A    CURRENT PREGNANCY: N/A                                    MENTAL STATUS EXAMINATION                     General Appearance: unable to assess      Interaction with Interviewer (eye contact, attitude, behavior): cooperative     Physical Signs    Gait and Station (how patient walks and stands): unable to aseess                   Physical Appearance: unable to assess           Normal Movements: unable to assess         Speech (rate, volume, articulation): Within normal limits          Language: [place] only                        Mood: "Very anxious" }            Affect: Seemed congruent, worried             Thought Process     Rate; concrete vs abstract reasoning: Within normal limits        Logical vs illogical; associations: loose, tangential, circumstantial, intact: Within normal limits                                    Thought Content    Normal Thought Content (other than safety): Within normal limits     Perceptions (auditory, visual, tactile, etc.): Within normal limits       Impulse Control: Within normal limits         Cognition (Link to [facility])    Orientation (person, place, time): Within normal limits      Recent and remote memory: Within normal limits           Attention span and concentration: Alert          Fund of knowledge, awareness of current events and vocabulary: Within normal limits      Judgment: Fair           Insight: Fair         Suicidality/Homicidality/Aggression (Victimization or Perpetration): Within normal limits     ASSESSMENT   Today's Assessment:  Pt presented with increased anxiety with recent panic episodes due to stress reaction in the context of Covid-19 pandemic   Has limited coping skills, but has the support of family members   No acute safety issues    Risk Level  Assessment  Risk Level Change (if yes, please describe): No    Suicide: low (1)  Violence: low (1)  Addiction: low (1)        Protective Factors: Supportive  family, religious, stable housing, help-seeking     DIAGNOSES ASSESSED TODAY (psychiatric diagnoses and medical diagnoses that factor into management of psychiatric treatment): (F41.0) Severe anxiety with panic    CLINICAL FORMULATION (Make changes as your understanding changes. Should coincide with treatment plan): Patient is a 63-y-o married [name] who presented with hx of depression and anxiety with panic attacks. Pt's psychiatric sx's started in [place] in 1996 in the context of work related stress.   Pt symptoms worsened in the context of her immigration to the [name] last year (07/2017) due to difficult transition here.   Pt's sx's exacerbated in the context of Covid-19 pandemic   Pt and her 70-y-o husband are currently living at their daughter's house (basement)   Pt is on med prescribed by Sara Pick, APRN and denied any acute safety issues     REVIEWING TODAY'S VISIT  CLINICAL INTERVENTIONS TODAY: Risk assessment done; empathic listening, psycho-education about Covid-19 and skills to deal with panic episode; supportive CB T approach provided     PATIENT'S RESPONSE TO INTERVENTIONS: appreciative and positive     PROGRESS TOWARDS GOALS: in agreement with treatment plan     TIME SPENT IN PSYCHOTHERAPY: 40 minutes     PLAN  PLAN FOR MANAGING RISK (Consider risk plan for patients at moderate or high risk for suicide/violence/addiction; medication plan; referrals, etc. Must coincide with treatment plan.): Not at moderate or high risk     PLAN FOR ONGOING TREATMENT:  Consents to ongoing individual psychotherapy   Continue on med     INFORMED CONSENT (for any new treatment): N/A      ONLY FOR PRESCRIBERS DOING EVALUATION AND MANAGEMENT VISITS     Use only when no psychotherapy performed  COUNSELING AND COORDINATION OF CARE PROVIDED (Consider diagnostic results/impressions and/or recommended studies; risks and benefits of treatment options; instruction for management/treatment and/or follow-up; importance of  compliance with chosen treatment options; risk factor reduction; patient/family/caregiver education; prognosis): N/A      Over 50% of time during today's visit was devoted to counseling and/or coordination of care.  If yes, record estimated duration of the face to face encounter.  N/A    INSTRUCTIONS TO COVERING CLINICIANS:  N/A

## 2018-11-24 ENCOUNTER — Other Ambulatory Visit (HOSPITAL_BASED_OUTPATIENT_CLINIC_OR_DEPARTMENT_OTHER): Payer: Self-pay

## 2018-11-24 NOTE — Progress Notes (Signed)
Patient has been transferred to [facility] psych for psychopharm treatment.   Per Murvin Donning, staff from central intake "11/24/2018 - Hi Krissie Merrick! I called the patient today at 5:20 pm on both telephone number [phone] and [phone] and left a message to call me [phone]) on central intake [phone]) back to schedule a new appointment with Geriatric pharmachologist . Waiting for a call back".

## 2018-11-28 ENCOUNTER — Ambulatory Visit (HOSPITAL_BASED_OUTPATIENT_CLINIC_OR_DEPARTMENT_OTHER): Payer: No Typology Code available for payment source | Admitting: Internal Medicine

## 2018-11-28 ENCOUNTER — Other Ambulatory Visit: Payer: Self-pay

## 2018-11-28 NOTE — Progress Notes (Signed)
Patient left without being seen.

## 2018-11-28 NOTE — Televisit Note (Signed)
Called x1 ; went to message 1:26     Called x2 ; left message will call in 5 min : 1: 28 pm     Called x3 left message

## 2018-12-02 ENCOUNTER — Other Ambulatory Visit (HOSPITAL_BASED_OUTPATIENT_CLINIC_OR_DEPARTMENT_OTHER): Payer: Self-pay

## 2018-12-02 NOTE — Telephone Encounter (Signed)
Planned Care Outreach    Pt had tele-visit scheduled for 11/28/18 (no show) called patient back to r/s, left vm  Jennette Banker, [place], 12/02/2018

## 2018-12-12 ENCOUNTER — Encounter (HOSPITAL_BASED_OUTPATIENT_CLINIC_OR_DEPARTMENT_OTHER): Payer: Self-pay

## 2018-12-12 ENCOUNTER — Ambulatory Visit: Payer: No Typology Code available for payment source

## 2018-12-12 DIAGNOSIS — F410 Panic disorder [episodic paroxysmal anxiety] without agoraphobia: Secondary | ICD-10-CM | POA: Diagnosis not present

## 2018-12-12 DIAGNOSIS — F330 Major depressive disorder, recurrent, mild: Secondary | ICD-10-CM | POA: Insufficient documentation

## 2018-12-12 MED ORDER — VENLAFAXINE HCL ER 75 MG PO CP24
75.0000 mg | ORAL_CAPSULE | Freq: Every day | ORAL | 2 refills | Status: DC
Start: 2018-12-12 — End: 2019-04-18

## 2018-12-12 MED FILL — CALCIUM/VITD 500-400: 90 days supply | Qty: 180 | Fill #1 | Status: CP

## 2018-12-12 MED FILL — VENLAFAXINE  37.5MG ER: 90 days supply | Qty: 90 | Fill #1 | Status: CP

## 2018-12-12 MED FILL — VALSARTAN 320MG: 90 days supply | Qty: 90 | Fill #1 | Status: CP

## 2018-12-12 MED FILL — VENLAFAXINE 75MG ER: 30 days supply | Qty: 30 | Fill #0 | Status: CP

## 2018-12-12 MED FILL — *CHLORTHALID 25MG: 90 days supply | Qty: 90 | Fill #1 | Status: CP

## 2018-12-12 MED FILL — TRAZODONE  50MG: 90 days supply | Qty: 90 | Fill #1 | Status: CP

## 2018-12-12 MED FILL — LORATADINE  10MG: 90 days supply | Qty: 90 | Fill #1 | Status: CP

## 2018-12-12 MED FILL — CLONAZEPAM 1MG: 30 days supply | Qty: 60 | Fill #1 | Status: CP

## 2018-12-12 MED FILL — OMEPRAZOLE   20MG: 90 days supply | Qty: 90 | Fill #1 | Status: CP

## 2018-12-12 NOTE — Progress Notes (Signed)
OUTPATIENT PSYCHIATRY PROGRESS NOTE      INTERPRETER: None needed.  INTERPRETER NAME: N/A      CONTACT INFO FOR OTHER AGENCIES AND MENTAL HEALTH PROVIDERS:   PCP: Overton MamAparna Batlapenumarthy, MD  Psychotherapist: Mell[facility]os DaSilva, [facility]        FOCUS OF CURRENT SESSION:  Problem 1: Depression   Problem 2: Anxiety / panic attacks   Problem 3: Insomnia       SUBJECTIVE:  TODAY'S CHIEF COMPLAINT AND CLINICAL UPDATES IN PATIENT'S WORDS:  1) Chief Complaint:  "I am feeling very anxious"       2) new information from patient and/or collateral:  Her last appointment was in April of this year.   Pt has missed several Televisits by not answering the phone when calls are made.   Pt has been transferred for psychopharm tx to the geripsych department due to her age.   She has not returned calls to the intake staff who [facility].   This appointment was scheduled in error.     Patient reported feeling anxious. She is having panic attacks.  She feels sad but she denied major symptoms of depression, SI and hopelessness.   Sometimes, her blood pressure increases for no reason. She sweats and she becomes tachycardic. Her heart rate gets elevated. Sometimes her heart rate rises to 130 b/m.   She recently missed a Televisit with her PCP to address these symptoms.     She is compliant with 112 mg of Venlafaxine along with 50 mg of Trazodone.   She is not taking the correct dose of Clonazepam. She is taking 1 mg daily instead of 2 mg daily, stating that the pharmacy didn't deliver her Clonazepam this month. Pt confirmed that her daughter never called the pharmacy to request delivery.      Pt reported sleeping well. Her appetite is normal. She denied changes with her weight.     She is facing many stressors such as: The pandemic, immigration and acculturation issues, financial difficulties, transportation problem as well as lack of support from her daughters.     She had some psychotherapy  [facility]its [facility].      OBJECTIVE:  DATA REVIEWED: Chart, prescription monitoring program.     Most Recent Weight Reading(s)  08/22/18 : 83.5 kg (184 lb)  06/29/18 : 81.2 kg (179 lb)  05/23/18 : 80.7 kg (178 lb)  02/07/18 : 81.6 kg (180 lb)  12/20/17 : 80.7 kg (178 lb)      Most Recent BP Reading(s)  08/22/18 : 133/78  06/29/18 : (!) 152/86  05/23/18 : (!) 154/71  02/07/18 : (!) 165/77  12/20/17 : 133/76       CURRENT MEDICATIONS:    omeprazole (PRILOSEC) 20 MG capsule, Take 1 capsule by mouth daily, Disp: 30 capsule, Rfl: 6    Venlafaxine (EFFEXOR-XR) 37.5 MG 24 hr capsule, Take 1 capsule daily with 75 mg. TDD=112.5MG , Disp: 30 capsule, Rfl: 6    valsartan (DIOVAN) 320 MG tablet, Take 1 tablet by mouth daily, Disp: 30 tablet, Rfl: 11    TraZODone (DESYREL) 50 MG tablet, Take 1 tablet by mouth nightly, Disp: 30 tablet, Rfl: 11    raNITIdine (ZANTAC) 150 MG tablet, Take 1 tablet by mouth nightly, Disp: 30 tablet, Rfl: 11    Multiple Vitamins-Minerals (HAIR SKIN AND NAILS FORMULA) TABS, Take 1 capsule by mouth daily, Disp: 30 tablet, Rfl: 12    loratadine (CLARITIN)  10 MG tablet, Take 1 tablet by mouth daily, Disp: 30 tablet, Rfl: 11    chlorthalidone (HYGROTEN) 25 MG tablet, Take 1 tablet by mouth daily, Disp: 90 tablet, Rfl: 6    calcium-vitamin D (OSCAL-500) 500-400 MG-UNIT per tablet, Take 1 tablet by mouth 2 (two) times daily, Disp: 60 tablet, Rfl: 11    azelastine (ASTELIN) 0.1 % nasal spray, 1 spray by Each Nostril route 2 (two) times daily Use in each nostril as directed, Disp: 30 mL, Rfl: 4    atenolol (TENORMIN) 50 MG tablet, Take 1 tablet by mouth daily, Disp: 90 tablet, Rfl: 6    acetaminophen (TYLENOL) 500 MG tablet, Take 1 tablet by mouth every 6 (six) hours as needed for Pain, Disp: 30 tablet, Rfl: 6    fluticasone (FLONASE) 50 MCG/ACT nasal spray, 1 spray by Each Nostril route daily, Disp: 1 Bottle, Rfl: 5    venlafaxine (EFFEXOR-XR) 75 MG 24 hr capsule, Take 1 capsule by  mouth daily, Disp: 30 capsule, Rfl: 2    ClonazePAM (KLONOPIN) 1 MG tablet, Take 1 tablet by mouth 2 (two) times daily, Disp: 60 tablet, Rfl: 2    fluticasone (FLOVENT HFA) 110 MCG/ACT inhaler, Inhale 1 puff into the lungs 2 (two) times daily  for 10 days, Disp: 1 Inhaler, Rfl: 0      MEDICATION ADHERENCE (including barriers and how addressed): Yes.    MEDICATIONS SIDE EFFECTS: None    PAST MEDICATIONS:    Fluoxetine  Sertraline-Not effective     BIRTH CONTROL (ask females and males): N/A    CURRENT PREGNANCY: N/A    AIMS: N/A       MENTAL STATUS EXAMINATION                General Appearance: Not observed   Interaction with Interviewer (eye contact, attitude, behavior): Yes, very communicative.   Physical Signs   Gait and Station (how patient walks and stands): Not observed   Physical Appearance: Not observed   Normal Movements: Not observed   Speech (rate, volume, articulation): Very talkative and loud at times  Language: Within normal limits, speaking in [place]  Mood: "Worried, anxious"    Affect: Congruent   Thought Process: Logical   Thought Content:  Tangential   Perceptions: No hallucinations   Impulse Control: Good   Cognition (Link to [facility])   Orientation (person, place, time): Yes   Recent and remote memory: Intact   Attention span and concentration: Impaired   Fund of knowledge, awareness of current events and vocabulary: Yes   Judgment: Yes   Insight: Limited       SAFETY EXAMINATION (if yes describe findings in detail)   Suicidality: No   Homicidality: No   Aggression toward others:No   Victim or at risk of aggression by others: No   Substance use/abuse (legal and illicit): No         Risk Level Assessment  Risk Level Change (if yes, please describe): No    Suicide: low (1)  Violence: low (1)  Addiction: low (1)        Protective factors: Family connection, strong faith.       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   She has a history of chronic anxiety with panic attacks and depression.   The client has no prior history of psychiatric hospitalizations, mania, psychosis, safety issues, violence or substance abuse issues.   Past medical history includes: Essential hypertension, labyrinthitis, hyperlipidemia and chronic cough  There is a strong familial predisposition to psychiatric problems since family history is positive for depression and anxiety.   Psychiatric symptoms started in [place] in 1996 in the context of work related stress. They recently exacerbated due to immigration related problems.   Pt had been in treatment with Sertraline and Clonazepam since 2005. Trazodone was added during a previous visit and it has helped with insomnia.   She has been adamant about continuing with the same dose of Clonazepam, but she has benefited from splitting the dose from 2 mg QHS to 1 mg BID.   During her visit in December 2019, she requested to replace Sertraline with Venlafaxine. Symptoms of anxiety and depression had improved with 112.5 mg of Venlafaxine along with Clonazepam and Trazodone for sleep.   Most recently, pt decreased the doe of Clonazepam by mistake and she started to experience increased symptoms of anxiety,leading to panic attacks. Anxiety may be increasing her blood pressure.   She continues to struggle with multiple psychosocial stressors. Due to her immigration status, she is not eligible for public benefits.   She has limited psychological coping skills.     She started psych[facility] tx [facility]. She may benefit from frequent sessions.     Patient's ethnic and cultural factors: Recent [place] immigrant, not able to speak [place].     Patient's religion and  spiritual beliefs, values and preferences: Ephriam KnucklesChristian, believes in God. Listens to gospel music and used to go to church prior to the pandemic.     PLAN:  PLAN FOR MANAGING RISK (Consider risk plan for patients at moderate or high risk for suicide/violence/addiction; medication plan; referrals, etc. Must coincide with treatment plan.):   Not at moderate or hight risk.        PLAN:   Continue Venlafaxine, 112.5 mg daily (75 +37.5 mg).  Monitor blood pressure. Follow up with PCP for tx of blood pressure.   Resume Clonazepam, 1 mg BID.   Review prescription monitoring program.   Continue Trazodone, 50 mg QHS for sleep.   Continue [facility] tx [facility]   [facility]. Appt scheduled with Mirna MiresLinda Lombardi, RNCS  Call 911 or proceed to the nearest ER in case symptoms worsen acutely or in the event of any other psychiatric emergency.  Collaborate with other providers as needed   Order complementary labs and tests as needed   PCP has been informed that pt may benefit from the support of Bobbe MedicoSandra Campos, mental health care partner or Melvenia NeedlesMaria Alice Smolka, Patient Resources Coordinator.       INFORMED CONSENT (for any new treatment): Patient was informed of the potential risks and benefits of the treatment, including the option not to treat, and appeared to understand and agreed to comply. Discussion included the following key points:   Pt understands the benefits and side effects of Trazodone, Clonazepam and Venlafaxine, including increased BP with Venlafaxine.   She consents fully.       REVIEWING TODAY'S VISIT  CLINICAL INTERVENTIONS TODAY:   -Provided medication management-Reviewed medication adherence/tolerance.  -Performed mental status and risk assessments.   -Provided psychoeducation, support.   -Explored ways of increasing psychological coping  skills.   -Continued to encourage pt to take the appropriate steps to avoid getting sick with the coronavirus.   -Per  request, called the pharmacy to request medication delivery. Informed pt and daughter that delivery is not automatic. They have     to request it whenever they need medications.   -Reviewed [place]. Pt last refilled a 30 day supply of Clonazepam in April. She didn't ask for a refill in May.   -Discussed termination.   -Encourage pt to attend psychotherapy treatment frequently.       PATIENT'S RESPONSE TO INTERVENTIONS: Receptive     PROGRESS TOWARDS GOALS:  Symptomatic     Estimated time spent in psychotherapy: N/A      FOR PRESCRIBERS and EVALUATION AND MANAGEMENT VISITS ONLY    Use only when no psychotherapy performed  COUNSELING AND COORDINATION OF CARE PROVIDED (Consider diagnostic results/impressions and/or recommended studies; risks and benefits of treatment options; instruction for management/treatment and/or follow-up; importance of compliance with chosen treatment options; risk factor reduction; patient/family/caregiver education; prognosis): Yes:       Over 50% of time during today's visit was devoted to counseling and/or coordination of care.  If yes, record estimated duration of the face to face encounter.    YES,  ESTIMATED TIME: 25 minutes     INSTRUCTIONS TO COVERING CLINICIANS:  OK to refill.    Noralyn PickEdilia Carmel Garfield, APRN

## 2018-12-15 ENCOUNTER — Ambulatory Visit: Payer: No Typology Code available for payment source | Attending: Internal Medicine | Admitting: Internal Medicine

## 2018-12-15 DIAGNOSIS — R002 Palpitations: Secondary | ICD-10-CM | POA: Insufficient documentation

## 2018-12-15 DIAGNOSIS — F5104 Psychophysiologic insomnia: Secondary | ICD-10-CM

## 2018-12-15 DIAGNOSIS — F411 Generalized anxiety disorder: Secondary | ICD-10-CM

## 2018-12-15 DIAGNOSIS — I10 Essential (primary) hypertension: Secondary | ICD-10-CM

## 2018-12-15 MED ORDER — VALSARTAN 320 MG PO TABS
320.0000 mg | ORAL_TABLET | Freq: Every day | ORAL | 11 refills | Status: DC
Start: 2018-12-15 — End: 2019-03-27

## 2018-12-15 MED ORDER — CHLORTHALIDONE 25 MG PO TABS
25.0000 mg | ORAL_TABLET | Freq: Every day | ORAL | 6 refills | Status: DC
Start: 2018-12-15 — End: 2019-03-27

## 2018-12-15 MED ORDER — ATENOLOL 50 MG PO TABS
ORAL_TABLET | ORAL | 6 refills | Status: DC
Start: 2018-12-15 — End: 2019-03-27

## 2018-12-15 MED FILL — ATENOLOL 50MG: 30 days supply | Qty: 60 | Fill #0

## 2018-12-15 NOTE — Progress Notes (Signed)
HPI  Sara CorwinMaria Cupp is a 63 year old female presenting for palpitations      HTN  -monitoring at home  -sometimes elevated, going up and down     Palpitations  -recently reports palpitations  -would like testing done for this   -recent imaging completed  Pt is very anxious as she is home bound, she is taking care of her 215 and 65 years old grand children   She is frustrated and anxious about the whole situation      Patient Active Problem List:     Essential hypertension     Labyrinthitis     Panic disorder     Hyperlipidemia     Chronic cough     Mild episode of recurrent major depressive disorder (HCC)     Allergic rhinitis     Need for prophylactic vaccination and inoculation against influenza     Left arm pain     Decreased GFR    Social History     Socioeconomic History    Marital status: Married     Spouse name: Not on file    Number of children: Not on file    Years of education: Not on file    Highest education level: Not on file   Occupational History    Not on file   Social Needs    Financial resource strain: Not on file    Food insecurity:     Worry: Not on file     Inability: Not on file    Transportation needs:     Medical: Not on file     Non-medical: Not on file   Tobacco Use    Smoking status: Former Smoker     Packs/day: 2.00     Years: 25.00     Pack years: 50.00     Last attempt to quit: 10/27/1977     Years since quitting: 41.1    Smokeless tobacco: Never Used   Substance and Sexual Activity    Alcohol use: Not Currently    Drug use: Never    Sexual activity: Yes     Partners: Male   Lifestyle    Physical activity:     Days per week: Not on file     Minutes per session: Not on file    Stress: Not on file   Relationships    Social connections:     Talks on phone: Not on file     Gets together: Not on file     Attends religious service: Not on file     Active member of club or organization: Not on file     Attends meetings of clubs or organizations: Not on file     Relationship status:  Not on file    Intimate partner violence:     Fear of current or ex partner: Not on file     Emotionally abused: Not on file     Physically abused: Not on file     Forced sexual activity: Not on file   Other Topics Concern    Not on file   Social History Narrative    [place]    History of childhood trauma    Lives with family     Past Surgical History:  10/2016: HEMORRHOID SURGERY  No date: OB ANTEPARTUM CARE CESAREAN DLVR & POSTPARTUM  No family history on file.      Current Outpatient Medications:  valsartan (DIOVAN) 320 MG tablet  Take 1 tablet by mouth daily Disp: 30 tablet Rfl: 11   chlorthalidone (HYGROTEN) 25 MG tablet Take 1 tablet by mouth daily Disp: 90 tablet Rfl: 6   atenolol (TENORMIN) 50 MG tablet One tab in am and one tab in pm Disp: 60 tablet Rfl: 6   venlafaxine (EFFEXOR-XR) 75 MG 24 hr capsule Take 1 capsule by mouth daily Take along with 37.5 mg. TDD=112.5 mg. Disp: 30 capsule Rfl: 2   clonazePAM (KLONOPIN) 1 MG tablet Take 1 tablet by mouth 2 (two) times daily Disp: 60 tablet Rfl: 2   omeprazole (PRILOSEC) 20 MG capsule Take 1 capsule by mouth daily Disp: 30 capsule Rfl: 6   venlafaxine (EFFEXOR-XR) 37.5 MG 24 hr capsule Take 1 capsule daily with 75 mg. TDD=112.5MG  Disp: 30 capsule Rfl: 6   traZODone (DESYREL) 50 MG tablet Take 1 tablet by mouth nightly Disp: 30 tablet Rfl: 11   raNITIdine (ZANTAC) 150 MG tablet Take 1 tablet by mouth nightly Disp: 30 tablet Rfl: 11   Multiple Vitamins-Minerals (HAIR SKIN AND NAILS FORMULA) TABS Take 1 capsule by mouth daily Disp: 30 tablet Rfl: 12   loratadine (CLARITIN) 10 MG tablet Take 1 tablet by mouth daily Disp: 30 tablet Rfl: 11   calcium-vitamin D (OSCAL-500) 500-400 MG-UNIT per tablet Take 1 tablet by mouth 2 (two) times daily Disp: 60 tablet Rfl: 11   azelastine (ASTELIN) 0.1 % nasal spray 1 spray by Each Nostril route 2 (two) times daily Use in each nostril as directed Disp: 30 mL Rfl: 4   acetaminophen (TYLENOL) 500 MG tablet Take  1 tablet by mouth every 6 (six) hours as needed for Pain Disp: 30 tablet Rfl: 6   fluticasone (FLONASE) 50 MCG/ACT nasal spray 1 spray by Each Nostril route daily Disp: 1 Bottle Rfl: 5   fluticasone (FLOVENT HFA) 110 MCG/ACT inhaler Inhale 1 puff into the lungs 2 (two) times daily  for 10 days Disp: 1 Inhaler Rfl: 0     No current facility-administered medications for this visit.   Review of Patient's Allergies indicates:   Pollen extract          Cough  Review of Systems   Constitution: Negative.   HENT: Negative.    Eyes: Negative.    Cardiovascular: Positive for palpitations.   Respiratory: Positive for shortness of breath.    Endocrine: Negative.    Hematologic/Lymphatic: Negative.    Skin: Negative.    Musculoskeletal: Negative.    Gastrointestinal: Negative.    Genitourinary: Negative.    Neurological: Negative.    Psychiatric/Behavioral: Negative.    Allergic/Immunologic: Negative.        There were no vitals taken for this visit.  Physical Exam    Assessment /Plan           1. Palpitations  -has recently reported palpitations   -worried this could be a heart issue  -would like testing completed  Likely related to the anxiety but we r/o any structural cause   - HOLTER MONITORING; Future  - ECHOCARDIOGRAM W/ DOPPLER; Future  Will f/u with patient regarding results and further assessment    2. Essential hypertension  -monitoring at home  -sometimes elevated; potentially due to anxiety  -continue medications   - valsartan (DIOVAN) 320 MG tablet; Take 1 tablet by mouth daily  Dispense: 30 tablet; Refill: 11  - chlorthalidone (HYGROTEN) 25 MG tablet; Take 1 tablet by mouth daily  Dispense: 90 tablet; Refill: 6  - atenolol (TENORMIN) 50 MG  tablet; One tab in am and one tab in pm  Dispense: 60 tablet; Refill: 6  F/u 1 month for HTN    Anxiety : pt is following with [place] [facility]   Explained that she needs to work on [employer]ildfulness and work on her thoughts   She plays video games until midnight sometimes  till  4 am which is likely spoiling her sleep , leading to insomnia   Encouraged to cut down on the video games which will help her with sleep which may inturn help with HTN and palpitations    Will follow up in 1 month    Scribe Attestation- Chart written by Camelia EngSuhina Srivastav, 12/15/2018     Written in the presence of and acting as a scribe for Overton MamAparna Jolea Dolle, MD       Attending to Scribe-Chart documented by scribe in my presence. Documentation accepted as noted in record. Work performed by me and documented by scribe. Any changes or disagreements are noted in record Overton MamAparna Jeanetta Alonzo, MD, 12/15/2018    We discussed the importance of medication compliance. The patient was ready to learn and no apparent learning barriers were identified. I explained the diagnosis and treatment plan, and the patient expressed understanding of the content. Possible side effects of the prescribed medication(s) were explained. I attempted to answer any questions regarding the diagnosis and the proposed treatment.    We discussed the patients current medications. The patient expressed understanding and no barriers to adherence were identified.

## 2018-12-16 ENCOUNTER — Telehealth (HOSPITAL_BASED_OUTPATIENT_CLINIC_OR_DEPARTMENT_OTHER): Payer: Self-pay

## 2018-12-16 NOTE — Progress Notes (Signed)
On behalf of Dr. Raina Mina, Care Partner called patient to follow up about message below:    "Can you please talk to this patient about mindfulness   She is so anxious has erratic BP readings and tachycardia likely due to anxiety ."    Patient was not reached. Left her a message to call me back at [phone].

## 2018-12-20 ENCOUNTER — Telehealth (HOSPITAL_BASED_OUTPATIENT_CLINIC_OR_DEPARTMENT_OTHER): Payer: Self-pay

## 2018-12-20 NOTE — Progress Notes (Signed)
On behalf of Dr. Batla, Care Partner called patient to follow up about message below:    "Can you please talk to this patient about mindfulness   She is so anxious has erratic BP readings and tachycardia likely due to anxiety ."    Patient was not reached. Left her a message to call me back at [phone].

## 2018-12-28 ENCOUNTER — Telehealth (HOSPITAL_BASED_OUTPATIENT_CLINIC_OR_DEPARTMENT_OTHER): Payer: Self-pay

## 2018-12-28 NOTE — Progress Notes (Signed)
On behalf of Dr. Batla, Care Partner called patient to follow up about message below:    "Can you please talk to this patient about mindfulness   She is so anxious has erratic BP readings and tachycardia likely due to anxiety ."    Patient was not reached. Left her a message to call me back at [phone].

## 2019-01-10 ENCOUNTER — Telehealth (HOSPITAL_BASED_OUTPATIENT_CLINIC_OR_DEPARTMENT_OTHER): Payer: Self-pay

## 2019-01-10 NOTE — Progress Notes (Signed)
On behalf of Dr. Batla, Care Partner called patient to follow up about message below:    "Can you please talk to this patient about mindfulness   She is so anxious has erratic BP readings and tachycardia likely due to anxiety ."    Patient was not reached. Left her a message to call me back at [phone].

## 2019-01-10 NOTE — Progress Notes (Signed)
Lvm on patient's daughter's phone to call clinic back for a televisit for patient to get lab orders she requested.    Thank you,    Sunday Spillers.

## 2019-01-25 ENCOUNTER — Ambulatory Visit (HOSPITAL_BASED_OUTPATIENT_CLINIC_OR_DEPARTMENT_OTHER): Payer: No Typology Code available for payment source | Admitting: "Psychiatric/Mental Health

## 2019-01-30 ENCOUNTER — Telehealth (HOSPITAL_BASED_OUTPATIENT_CLINIC_OR_DEPARTMENT_OTHER): Payer: Self-pay

## 2019-01-30 MED FILL — ATENOLOL 50MG: 30 days supply | Qty: 60 | Fill #0 | Status: CP

## 2019-01-30 MED FILL — CLONAZEPAM 1MG: 30 days supply | Qty: 60 | Fill #2 | Status: CP

## 2019-01-30 MED FILL — VENLAFAXINE 75MG ER: 30 days supply | Qty: 30 | Fill #1 | Status: CP

## 2019-01-30 NOTE — Progress Notes (Signed)
Care Partner called patient to follow up about anxiety and missed appointment with Geri-psychiatry on 01/25/19.  Patient also needs to schedule a f/up with psychotherapist [facility].    Patient was not reached.  Left her a message to call me back at [phone]. Also, left a voicemail to patient's daughter.

## 2019-01-31 ENCOUNTER — Telehealth (HOSPITAL_BASED_OUTPATIENT_CLINIC_OR_DEPARTMENT_OTHER): Payer: Self-pay

## 2019-01-31 NOTE — Progress Notes (Signed)
On behalf of Dr. Batla, Care Partner called patient to follow up about message below:     "Can you please talk to this patient about mindfulness   She is so anxious has erratic BP readings and tachycardia likely due to anxiety ."     Patient was not reached. Left her a message to call me back at [phone].    Also, left info that psychotherapy is booked with Marcos da Silva on 02/15/19 at 4 pm.

## 2019-02-01 ENCOUNTER — Telehealth (HOSPITAL_BASED_OUTPATIENT_CLINIC_OR_DEPARTMENT_OTHER): Payer: Self-pay

## 2019-02-01 NOTE — Progress Notes (Signed)
On behalf of Dr. Raina Mina, Care Partner called patient to follow up about message below:    "Can you please talk to this patient about mindfulness   She is so anxious has erratic BP readings and tachycardia likely due to anxiety."    Patient was not reached. Left her a message to call me back at [phone].    Also, left info that psychotherapy is booked with Rhea Bleacher on 02/15/19 at 4 pm.

## 2019-02-02 ENCOUNTER — Other Ambulatory Visit (HOSPITAL_BASED_OUTPATIENT_CLINIC_OR_DEPARTMENT_OTHER): Payer: Self-pay

## 2019-02-15 ENCOUNTER — Telehealth (HOSPITAL_BASED_OUTPATIENT_CLINIC_OR_DEPARTMENT_OTHER): Payer: Self-pay

## 2019-02-15 ENCOUNTER — Ambulatory Visit (HOSPITAL_BASED_OUTPATIENT_CLINIC_OR_DEPARTMENT_OTHER): Payer: No Typology Code available for payment source

## 2019-02-15 NOTE — Progress Notes (Signed)
Unable to reach patient on 8/26     Provider tried 3 times; left message on pt's voicemail box for rescheduling [phone])

## 2019-02-16 ENCOUNTER — Telehealth (HOSPITAL_BASED_OUTPATIENT_CLINIC_OR_DEPARTMENT_OTHER): Payer: Self-pay

## 2019-02-16 NOTE — Progress Notes (Signed)
Care Partner called patient to follow up about anxiety.  Patient missed psychotherapy yesterday 02/15/19 with Rhea Bleacher.    Patient was reached.  Reports that did not hear the phone ringing.  Rescheduled appointment for 02/23/19.    PHQ-9 = 12 GAD-7 = 11 today.    Patient reports concerns about her health, pandemic and also frustration for not having a job.   Currently she takes care of her grandchildren but would like to have a job. Language barrier plus some health issues prevents her to do it.  Feeling stressed with kids.  Have been gaining weight and concerned about it.   She copes with anxiety playing some games on her cell phone or computer. Some days goes to bed around 4 am.     Intervention:  - Screened for PHQ-9 and GAD-7  - Reviewed sleep routine  - Encouraged physical activities  - Strongly reinforced self care  - Encouraged physical activities (walking around her house to start).  - Rescheduled psychotherapy for 02/23/19.

## 2019-02-20 ENCOUNTER — Other Ambulatory Visit: Payer: Self-pay

## 2019-02-22 ENCOUNTER — Ambulatory Visit: Payer: No Typology Code available for payment source | Admitting: Physician Assistant

## 2019-02-22 DIAGNOSIS — F411 Generalized anxiety disorder: Secondary | ICD-10-CM | POA: Diagnosis not present

## 2019-02-22 DIAGNOSIS — I10 Essential (primary) hypertension: Secondary | ICD-10-CM | POA: Diagnosis not present

## 2019-02-22 DIAGNOSIS — M545 Low back pain, unspecified: Secondary | ICD-10-CM

## 2019-02-22 NOTE — Progress Notes (Signed)
Subjective   [id number] Sara Snyder is a 63 year old female presents with multiple concerns: feeling hot, sweats, stress, body pain, back pain, stomach bloating, bone pain, etc.  Spent first 14 minutes of visit mentioning multiple concerns and talking over provider and interpretor, would not let interpretor interpret or provider ask questions. Then became tearful bc "there is not enough time and i'm afraid to go to the hospital".  Handed phone off to daughter b/c was tearful.      Per daughter - pt taking diovan + chlorthalidone + atenolol, states BPs not controlled, at home readings varying times of day have been: 120s-170s/80s-90s,    Most Recent BP Reading(s)  08/22/18 : 133/78  06/29/18 : (!) 152/86  05/23/18 : (!) 154/71  02/07/18 : (!) 165/77  12/20/17 : 133/76      Also has been c/o back pain - taking tylenol 1000mg  which doesn't help.  + h/o CKD, last BMP showed creat 1.4 08/2018    for f/u MDD, seeing [facility] pharm + therapy. Was referred to geripsych, missed appt 8/5, rescheduled for 10/21      Patient Active Problem List:     Essential hypertension     Labyrinthitis     Panic disorder     Hyperlipidemia     Chronic cough     Mild episode of recurrent major depressive disorder (HCC)     Allergic rhinitis     Need for prophylactic vaccination and inoculation against influenza     Left arm pain     Decreased GFR     Anxiety state     Palpitations     Psychophysiological insomnia      Current Outpatient Medications:  valsartan (DIOVAN) 320 MG tablet Take 1 tablet by mouth daily Disp: 30 tablet Rfl: 11   chlorthalidone (HYGROTEN) 25 MG tablet Take 1 tablet by mouth daily Disp: 90 tablet Rfl: 6   atenolol (TENORMIN) 50 MG tablet One tab in am and one tab in pm Disp: 60 tablet Rfl: 6   venlafaxine (EFFEXOR-XR) 75 MG 24 hr capsule Take 1 capsule by mouth daily Take along with 37.5 mg. TDD=112.5 mg. Disp: 30 capsule Rfl: 2   clonazePAM (KLONOPIN) 1 MG tablet Take 1 tablet by mouth 2 (two) times  daily Disp: 60 tablet Rfl: 2   omeprazole (PRILOSEC) 20 MG capsule Take 1 capsule by mouth daily Disp: 30 capsule Rfl: 6   venlafaxine (EFFEXOR-XR) 37.5 MG 24 hr capsule Take 1 capsule daily with 75 mg. TDD=112.5MG  Disp: 30 capsule Rfl: 6   traZODone (DESYREL) 50 MG tablet Take 1 tablet by mouth nightly Disp: 30 tablet Rfl: 11   raNITIdine (ZANTAC) 150 MG tablet Take 1 tablet by mouth nightly Disp: 30 tablet Rfl: 11   Multiple Vitamins-Minerals (HAIR SKIN AND NAILS FORMULA) TABS Take 1 capsule by mouth daily Disp: 30 tablet Rfl: 12   loratadine (CLARITIN) 10 MG tablet Take 1 tablet by mouth daily Disp: 30 tablet Rfl: 11   calcium-vitamin D (OSCAL-500) 500-400 MG-UNIT per tablet Take 1 tablet by mouth 2 (two) times daily Disp: 60 tablet Rfl: 11   azelastine (ASTELIN) 0.1 % nasal spray 1 spray by Each Nostril route 2 (two) times daily Use in each nostril as directed Disp: 30 mL Rfl: 4   acetaminophen (TYLENOL) 500 MG tablet Take 1 tablet by mouth every 6 (six) hours as needed for Pain Disp: 30 tablet Rfl: 6   fluticasone (FLONASE) 50 MCG/ACT  nasal spray 1 spray by Each Nostril route daily Disp: 1 Bottle Rfl: 5   fluticasone (FLOVENT HFA) 110 MCG/ACT inhaler Inhale 1 puff into the lungs 2 (two) times daily  for 10 days Disp: 1 Inhaler Rfl: 0     No current facility-administered medications for this visit.   Review of Patient's Allergies indicates:   Pollen extract          Cough  Social History     Socioeconomic History    Marital status: Married     Spouse name: Not on file    Number of children: Not on file    Years of education: Not on file    Highest education level: Not on file   Occupational History    Not on file   Social Needs    Financial resource strain: Not on file    Food insecurity:     Worry: Not on file     Inability: Not on file    Transportation needs:     Medical: Not on file     Non-medical: Not on file   Tobacco Use    Smoking status: Former Smoker     Packs/day: 2.00     Years: 25.00      Pack years: 50.00     Last attempt to quit: 10/27/1977     Years since quitting: 41.3    Smokeless tobacco: Never Used   Substance and Sexual Activity    Alcohol use: Not Currently    Drug use: Never    Sexual activity: Yes     Partners: Male   Lifestyle    Physical activity:     Days per week: Not on file     Minutes per session: Not on file    Stress: Not on file   Relationships    Social connections:     Talks on phone: Not on file     Gets together: Not on file     Attends religious service: Not on file     Active member of club or organization: Not on file     Attends meetings of clubs or organizations: Not on file     Relationship status: Not on file    Intimate partner violence:     Fear of current or ex partner: Not on file     Emotionally abused: Not on file     Physically abused: Not on file     Forced sexual activity: Not on file   Other Topics Concern    Not on file   Social History Narrative    [place]    History of childhood trauma    Lives with family     Past Surgical History:  10/2016: HEMORRHOID SURGERY  No date: OB ANTEPARTUM CARE CESAREAN DLVR & POSTPARTUM  No family history on file.           Objective     All ROS reviewed and discussed and are otherwise negative unless listed above.       PHYSICAL EXAM:  Pt sounds comfortable on phone, speaking in full sentences without cough, sob or wheezing. Normal affect.         A/P:  1. Anxiety state  Continue with psych, current medications. Encouraged self-care, exercise, meditation, utilizing support system.     2. Essential hypertension  Provider visit, routing to team to schedule, continue current meds and low salt diet, hydration. To ED if > 200.  3. Bilateral low back pain, unspecified chronicity, unspecified whether sciatica present  Schedule provider visit, add lidocaine patches, continue tylenol 1000mg , can take TID prn. Will continue to avoid nsaids 2/2 CKD. Contact if new/worsening symptoms.     Screening for  possible COVID:    1. Does the patient have cough, shortness of breath, or runny nose, including chronic conditions? No  2. In the past 14 days, has the patient:  a. had new fever, cough, shortness of breath, body aches, runny nose, sore throat, nausea, diarrhea, red eye, or lack of smell/taste? OR  No   b. has the patient had COVID or suspected COVID?  No      I have reviewed the past medical, surgical, social and family history and updated these sections of EpicCare as relevant. All interim labs, test results, and consult notes were reviewed and discussed with patient. Medications were reconciled during this visit and a current medication list was given to the patient at the end of the visit.      follow up as above, prn    Ernie Avena, PA-C

## 2019-02-23 ENCOUNTER — Telehealth (HOSPITAL_BASED_OUTPATIENT_CLINIC_OR_DEPARTMENT_OTHER): Payer: Self-pay

## 2019-02-23 ENCOUNTER — Ambulatory Visit (HOSPITAL_BASED_OUTPATIENT_CLINIC_OR_DEPARTMENT_OTHER): Payer: No Typology Code available for payment source

## 2019-02-23 NOTE — Progress Notes (Signed)
Unable to reach patient on 9/3     Provider called twice pt's number and then tried twice pt's daughter's number; no answer; left message on both voicemail boxes for rescheduling

## 2019-03-06 ENCOUNTER — Other Ambulatory Visit (HOSPITAL_BASED_OUTPATIENT_CLINIC_OR_DEPARTMENT_OTHER): Payer: Self-pay | Admitting: Internal Medicine

## 2019-03-06 ENCOUNTER — Other Ambulatory Visit (HOSPITAL_BASED_OUTPATIENT_CLINIC_OR_DEPARTMENT_OTHER): Payer: Self-pay

## 2019-03-06 DIAGNOSIS — K219 Gastro-esophageal reflux disease without esophagitis: Secondary | ICD-10-CM

## 2019-03-06 MED FILL — ATENOLOL 50MG: 30 days supply | Qty: 60 | Fill #1

## 2019-03-06 MED FILL — LORATADINE  10MG: 90 days supply | Qty: 90 | Fill #2

## 2019-03-06 MED FILL — *CHLORTHALID 25MG: 90 days supply | Qty: 90 | Fill #2

## 2019-03-06 MED FILL — OMEPRAZOLE   20MG: 30 days supply | Qty: 30 | Fill #0

## 2019-03-06 MED FILL — VENLAFAXINE 75MG ER: 30 days supply | Qty: 30 | Fill #2

## 2019-03-06 MED FILL — VALSARTAN 320MG: 30 days supply | Qty: 30 | Fill #0

## 2019-03-06 MED FILL — VENLAFAXINE  37.5MG ER: 30 days supply | Qty: 30 | Fill #0

## 2019-03-06 MED FILL — CALCIUM/VITD 500-400: 90 days supply | Qty: 180 | Fill #2

## 2019-03-06 MED FILL — TRAZODONE  50MG: 90 days supply | Qty: 90 | Fill #2

## 2019-03-06 NOTE — Progress Notes (Unsigned)
Person calling on behalf of patient: Pharmacy  Sara Snyder is a 63 year old female      - medication(s) being requested: clonazepam 1 mg tablet    Last [place] Office Visit: 12/12/2018 with Darryl Lent  Last Physical Exam: n-a    Other Med Adult:  Most Recent BP Reading(s)  08/22/18 : 133/78        Cholesterol (mg/dL)   Date Value   10/27/2017 204     LOW DENSITY LIPOPROTEIN DIRECT (mg/dL)   Date Value   10/27/2017 144     HIGH DENSITY LIPOPROTEIN (mg/dL)   Date Value   10/27/2017 35 (L)     TRIGLYCERIDES (mg/dL)   Date Value   10/27/2017 243 (H)         THYROID SCREEN TSH REFLEX FT4 (uIU/mL)   Date Value   02/07/2018 3.390         No results found for: TSH    HEMOGLOBIN A1C (%)   Date Value   08/22/2018 5.8 (H)       No results found for: POCA1C      No results found for: INR    SODIUM (mmol/L)   Date Value   08/22/2018 140       POTASSIUM (mmol/L)   Date Value   08/22/2018 4.0           CREATININE (mg/dL)   Date Value   08/22/2018 1.3 (H)       Documented patient preferred pharmacies:    [facility], [place] - [place].  Phone: [phone] Fax: [phone]

## 2019-03-06 NOTE — Progress Notes (Signed)
PER Pharmacy, Sara Snyder is a 64 year old female has requested a refill of omeprazole and effexor.      Last Office Visit: 02/22/2019  with Tanna Furry  Last Physical Exam: n-a      Other Med Adult:  Most Recent BP Reading(s)  08/22/18 : 133/78        Cholesterol (mg/dL)   Date Value   10/27/2017 204     LOW DENSITY LIPOPROTEIN DIRECT (mg/dL)   Date Value   10/27/2017 144     HIGH DENSITY LIPOPROTEIN (mg/dL)   Date Value   10/27/2017 35 (L)     TRIGLYCERIDES (mg/dL)   Date Value   10/27/2017 243 (H)         THYROID SCREEN TSH REFLEX FT4 (uIU/mL)   Date Value   02/07/2018 3.390         No results found for: TSH    HEMOGLOBIN A1C (%)   Date Value   08/22/2018 5.8 (H)       No results found for: POCA1C      No results found for: INR    SODIUM (mmol/L)   Date Value   08/22/2018 140       POTASSIUM (mmol/L)   Date Value   08/22/2018 4.0           CREATININE (mg/dL)   Date Value   08/22/2018 1.3 (H)       Documented patient preferred pharmacies:    [facility], [place] - [place].  Phone: [phone] Fax: [phone]

## 2019-03-07 MED FILL — CLONAZEPAM 1MG: 30 days supply | Qty: 60 | Fill #0

## 2019-03-16 ENCOUNTER — Ambulatory Visit (HOSPITAL_BASED_OUTPATIENT_CLINIC_OR_DEPARTMENT_OTHER)
Admission: RE | Admit: 2019-03-16 | Discharge: 2019-03-16 | Disposition: A | Discharge status: Home / Self Care | Payer: No Typology Code available for payment source | Source: Ambulatory Visit

## 2019-03-16 ENCOUNTER — Ambulatory Visit
Admission: RE | Admit: 2019-03-16 | Discharge: 2019-03-16 | Disposition: A | Discharge status: Home / Self Care | Payer: No Typology Code available for payment source | Attending: Internal Medicine | Admitting: Internal Medicine

## 2019-03-16 ENCOUNTER — Encounter (HOSPITAL_BASED_OUTPATIENT_CLINIC_OR_DEPARTMENT_OTHER): Payer: Self-pay

## 2019-03-16 ENCOUNTER — Other Ambulatory Visit: Payer: Self-pay

## 2019-03-16 ENCOUNTER — Other Ambulatory Visit (HOSPITAL_BASED_OUTPATIENT_CLINIC_OR_DEPARTMENT_OTHER): Payer: Self-pay | Admitting: Internal Medicine

## 2019-03-16 DIAGNOSIS — R002 Palpitations: Secondary | ICD-10-CM | POA: Diagnosis not present

## 2019-03-16 DIAGNOSIS — R928 Other abnormal and inconclusive findings on diagnostic imaging of breast: Secondary | ICD-10-CM

## 2019-03-16 LAB — ECHOCARDIOGRAM W/ DOPPLER: LVEF: 60 %

## 2019-03-22 ENCOUNTER — Other Ambulatory Visit: Payer: Self-pay

## 2019-03-22 ENCOUNTER — Other Ambulatory Visit (HOSPITAL_BASED_OUTPATIENT_CLINIC_OR_DEPARTMENT_OTHER): Payer: Self-pay

## 2019-03-22 NOTE — Telephone Encounter (Addendum)
Outreach pap smear    In [facility] pt due for pap smear.   I spoke with pt. She said that Dr. Raina Mina  Told her that she doesn't not need pap smears at her age.   Msg sent to provider to confirm. I will call back to let the pt know whether or not she needs one.  Milas Gain, [place], 03/22/2019

## 2019-03-27 ENCOUNTER — Ambulatory Visit: Payer: No Typology Code available for payment source | Attending: Internal Medicine | Admitting: Internal Medicine

## 2019-03-27 DIAGNOSIS — R002 Palpitations: Secondary | ICD-10-CM | POA: Insufficient documentation

## 2019-03-27 DIAGNOSIS — N6489 Other specified disorders of breast: Secondary | ICD-10-CM | POA: Diagnosis not present

## 2019-03-27 DIAGNOSIS — I10 Essential (primary) hypertension: Secondary | ICD-10-CM | POA: Diagnosis present

## 2019-03-27 MED ORDER — CHLORTHALIDONE 25 MG PO TABS
25.0000 mg | ORAL_TABLET | Freq: Every day | ORAL | 6 refills | Status: DC
Start: 2019-03-27 — End: 2019-04-10

## 2019-03-27 MED ORDER — VALSARTAN 320 MG PO TABS
320.0000 mg | ORAL_TABLET | Freq: Every day | ORAL | 11 refills | Status: DC
Start: 2019-03-27 — End: 2019-07-07

## 2019-03-27 MED ORDER — ATENOLOL 50 MG PO TABS
ORAL_TABLET | ORAL | 6 refills | Status: DC
Start: 2019-03-27 — End: 2019-08-18

## 2019-03-27 MED ORDER — TRAZODONE HCL 50 MG PO TABS
50.0000 mg | ORAL_TABLET | Freq: Every evening | ORAL | 11 refills | Status: DC
Start: 2019-03-27 — End: 2019-07-05

## 2019-03-27 NOTE — Progress Notes (Signed)
HPI   Says she is doing well   Stressed about the pandemic situation   Her spouse is doing o.k ,     Holter monitor :   Echo cardiogram : grade 2 diastolic dysfunction   BP readings : unknown     Social History     Socioeconomic History    Marital status: Married     Spouse name: Not on file    Number of children: Not on file    Years of education: Not on file    Highest education level: Not on file   Occupational History    Not on file   Social Needs    Financial resource strain: Not on file    Food insecurity     Worry: Not on file     Inability: Not on file    Transportation needs     Medical: Not on file     Non-medical: Not on file   Tobacco Use    Smoking status: Former Smoker     Packs/day: 2.00     Years: 25.00     Pack years: 50.00     Last attempt to quit: 10/27/1977     Years since quitting: 41.4    Smokeless tobacco: Never Used   Substance and Sexual Activity    Alcohol use: Not Currently    Drug use: Never    Sexual activity: Yes     Partners: Male   Lifestyle    Physical activity     Days per week: Not on file     Minutes per session: Not on file    Stress: Not on file   Relationships    Social connections     Talks on phone: Not on file     Gets together: Not on file     Attends religious service: Not on file     Active member of club or organization: Not on file     Attends meetings of clubs or organizations: Not on file     Relationship status: Not on file    Intimate partner violence     Fear of current or ex partner: Not on file     Emotionally abused: Not on file     Physically abused: Not on file     Forced sexual activity: Not on file   Other Topics Concern    Not on file   Social History Narrative    Originally from [place], [place]    History of childhood trauma    Lives with family     Patient Active Problem List:     Essential hypertension     Labyrinthitis     Panic disorder     Hyperlipidemia     Chronic cough     Mild episode of recurrent major depressive disorder (HCC)      Allergic rhinitis     Need for prophylactic vaccination and inoculation against influenza     Left arm pain     Decreased GFR     Anxiety state     Palpitations     Psychophysiological insomnia    Past Surgical History:  10/2016: HEMORRHOID SURGERY  No date: OB ANTEPARTUM CARE CESAREAN DLVR & POSTPARTUM  No family history on file.    clonazePAM (KLONOPIN) 1 MG tablet, Take 1 tablet by mouth 2 (two) times daily, Disp: 60 tablet, Rfl: 0  venlafaxine (EFFEXOR-XR) 37.5 MG 24 hr capsule, TAKE 1 CAPSULE DAILY WITH 75 MG. TDD=112.5MG ,  Disp: 30 capsule, Rfl: 5  omeprazole (PRILOSEC) 20 MG capsule, Take 1 capsule by mouth daily, Disp: 30 capsule, Rfl: 5  valsartan (DIOVAN) 320 MG tablet, Take 1 tablet by mouth daily, Disp: 30 tablet, Rfl: 11  chlorthalidone (HYGROTEN) 25 MG tablet, Take 1 tablet by mouth daily, Disp: 90 tablet, Rfl: 6  atenolol (TENORMIN) 50 MG tablet, One tab in am and one tab in pm, Disp: 60 tablet, Rfl: 6  venlafaxine (EFFEXOR-XR) 75 MG 24 hr capsule, Take 1 capsule by mouth daily Take along with 37.5 mg. TDD=112.5 mg., Disp: 30 capsule, Rfl: 2  traZODone (DESYREL) 50 MG tablet, Take 1 tablet by mouth nightly, Disp: 30 tablet, Rfl: 11  raNITIdine (ZANTAC) 150 MG tablet, Take 1 tablet by mouth nightly, Disp: 30 tablet, Rfl: 11  Multiple Vitamins-Minerals (HAIR SKIN AND NAILS FORMULA) TABS, Take 1 capsule by mouth daily, Disp: 30 tablet, Rfl: 12  loratadine (CLARITIN) 10 MG tablet, Take 1 tablet by mouth daily, Disp: 30 tablet, Rfl: 11  calcium-vitamin D (OSCAL-500) 500-400 MG-UNIT per tablet, Take 1 tablet by mouth 2 (two) times daily, Disp: 60 tablet, Rfl: 11  azelastine (ASTELIN) 0.1 % nasal spray, 1 spray by Each Nostril route 2 (two) times daily Use in each nostril as directed, Disp: 30 mL, Rfl: 4  acetaminophen (TYLENOL) 500 MG tablet, Take 1 tablet by mouth every 6 (six) hours as needed for Pain, Disp: 30 tablet, Rfl: 6  fluticasone (FLONASE) 50 MCG/ACT nasal spray, 1 spray by Each Nostril route  daily, Disp: 1 Bottle, Rfl: 5  fluticasone (FLOVENT HFA) 110 MCG/ACT inhaler, Inhale 1 puff into the lungs 2 (two) times daily  for 10 days, Disp: 1 Inhaler, Rfl: 0    No current facility-administered medications for this visit.     Review of Patient's Allergies indicates:   Pollen extract          Cough  ROS  As above          There were no vitals taken for this visit.  Physical Exam    (N64.89) Breast asymmetry  Comment: new assymmetry   Plan: [place] DIAGNOSTIC MAMMO UNI RIGHT DIGITAL WITH DBT         & CAD            (R00.2) Palpitations  Comment: with holter monitor SVT ,   Plan: REFERRAL TO CARDIOLOGY ( INT)    Prediabetes : needs labs a1c    Essential HTN : will have to bring her in for labs and BP check   Some days it is high ,  Will need inperson     Screening for cervical cancer : pt was at 18 yrs , pt says she never had pap in the past 10 yrs , pt would need pap   Will have to bring her in for pap   Says she can come only on Monday           Hotflashes :   Essential HTN :   Will check labs   Epigastric pain: refill zantac            We discussed the importance of medication compliance. The patient was ready to learn and no apparent learning barriers were identified. I explained the diagnosis and treatment plan, and the patient expressed understanding of the content. Possible side effects of the prescribed medication(s) were explained, .  I attempted to answer any questions regarding the diagnosis and the proposed treatment.    We discussed  the patients current medications. The patient expressed understanding and no barriers to adherence were identified.      Shaune Pascal, MD

## 2019-03-28 ENCOUNTER — Telehealth (HOSPITAL_BASED_OUTPATIENT_CLINIC_OR_DEPARTMENT_OTHER): Payer: Self-pay

## 2019-03-28 NOTE — Progress Notes (Signed)
LVM to schedule in person visit per PCP request. Await call back.    Sara Snyder, 03/28/2019    Shaune Pascal, MD  P Ec Support Staff Pool              Pt can make it to only Monday am appointment   Needs : pap Reva Bores Tonette Bihari   Prefer 9-11 am thanks   Please book with Dede Query       Book a televisit in 2-3 days later with me post visit       Thanks   Aparna

## 2019-03-30 MED FILL — ATENOLOL 50MG: 30 days supply | Qty: 60 | Fill #0

## 2019-03-30 MED FILL — VALSARTAN 320MG: 30 days supply | Qty: 30 | Fill #0

## 2019-03-30 NOTE — Progress Notes (Signed)
Screening for possible COVID:    1. In the past 14 days, has the patient had new or worsening (for chronic symptoms) fever, cough, shortness of breath, body aches, runny nose, sore throat, nausea, diarrhea, or lack of smell/taste? No   2. In the past 14 days, has the patient had COVID or suspected COVID?  No    Sara Snyder S [name], 03/30/2019

## 2019-04-10 ENCOUNTER — Other Ambulatory Visit: Payer: Self-pay

## 2019-04-10 ENCOUNTER — Ambulatory Visit: Payer: No Typology Code available for payment source | Attending: Internal Medicine | Admitting: Internal Medicine

## 2019-04-10 ENCOUNTER — Encounter (HOSPITAL_BASED_OUTPATIENT_CLINIC_OR_DEPARTMENT_OTHER): Payer: Self-pay | Admitting: Internal Medicine

## 2019-04-10 ENCOUNTER — Telehealth (HOSPITAL_BASED_OUTPATIENT_CLINIC_OR_DEPARTMENT_OTHER): Payer: Self-pay | Admitting: Internal Medicine

## 2019-04-10 VITALS — BP 172/80 | HR 78 | Temp 97.2°F | Resp 18 | Ht 59.66 in | Wt 189.0 lb

## 2019-04-10 DIAGNOSIS — H8309 Labyrinthitis, unspecified ear: Secondary | ICD-10-CM | POA: Insufficient documentation

## 2019-04-10 DIAGNOSIS — Z23 Encounter for immunization: Secondary | ICD-10-CM

## 2019-04-10 DIAGNOSIS — I10 Essential (primary) hypertension: Secondary | ICD-10-CM | POA: Diagnosis present

## 2019-04-10 DIAGNOSIS — R232 Flushing: Secondary | ICD-10-CM

## 2019-04-10 DIAGNOSIS — R1013 Epigastric pain: Secondary | ICD-10-CM | POA: Diagnosis present

## 2019-04-10 DIAGNOSIS — Z1159 Encounter for screening for other viral diseases: Secondary | ICD-10-CM | POA: Diagnosis present

## 2019-04-10 DIAGNOSIS — Z1211 Encounter for screening for malignant neoplasm of colon: Secondary | ICD-10-CM

## 2019-04-10 DIAGNOSIS — G8929 Other chronic pain: Secondary | ICD-10-CM

## 2019-04-10 DIAGNOSIS — Z1151 Encounter for screening for human papillomavirus (HPV): Secondary | ICD-10-CM

## 2019-04-10 DIAGNOSIS — Z124 Encounter for screening for malignant neoplasm of cervix: Secondary | ICD-10-CM

## 2019-04-10 DIAGNOSIS — R7303 Prediabetes: Secondary | ICD-10-CM | POA: Diagnosis present

## 2019-04-10 DIAGNOSIS — M545 Low back pain: Secondary | ICD-10-CM

## 2019-04-10 DIAGNOSIS — Z13820 Encounter for screening for osteoporosis: Secondary | ICD-10-CM

## 2019-04-10 LAB — COMPREHENSIVE METABOLIC PANEL
ALANINE AMINOTRANSFERASE: 30 U/L (ref 12–45)
ALBUMIN: 3.8 g/dL (ref 3.4–5.0)
ALKALINE PHOSPHATASE: 117 U/L (ref 45–117)
ANION GAP: 4 mmol/L — ABNORMAL LOW (ref 5–15)
ASPARTATE AMINOTRANSFERASE: 21 U/L (ref 8–34)
BILIRUBIN TOTAL: 0.3 mg/dL (ref 0.2–1.0)
BUN (UREA NITROGEN): 19 mg/dL — ABNORMAL HIGH (ref 7–18)
CALCIUM: 9.1 mg/dL (ref 8.5–10.1)
CARBON DIOXIDE: 31 mmol/L (ref 21–32)
CHLORIDE: 104 mmol/L (ref 98–107)
CREATININE: 1.1 mg/dL (ref 0.4–1.2)
ESTIMATED GLOMERULAR FILT RATE: 50 mL/min — ABNORMAL LOW (ref >60)
Glucose Random: 88 mg/dL (ref 74–160)
POTASSIUM: 4 mmol/L (ref 3.5–5.1)
SODIUM: 139 mmol/L (ref 136–145)
TOTAL PROTEIN: 7.4 g/dL (ref 6.4–8.2)

## 2019-04-10 LAB — CBC, PLATELET & DIFFERENTIAL
ABSOLUTE BASO COUNT: 0 10*3/uL (ref 0.0–0.1)
ABSOLUTE EOSINOPHIL COUNT: 0.1 10*3/uL (ref 0.0–0.8)
ABSOLUTE IMM GRAN COUNT: 0.02 10*3/uL (ref 0.00–0.03)
ABSOLUTE LYMPH COUNT: 2 10*3/uL (ref 0.6–5.9)
ABSOLUTE MONO COUNT: 0.3 10*3/uL (ref 0.2–1.4)
ABSOLUTE NEUTROPHIL COUNT: 3.3 10*3/uL (ref 1.6–8.3)
ABSOLUTE NRBC COUNT: 0 10*3/uL (ref 0.0–0.0)
BASOPHIL %: 0.5 % (ref 0.0–1.2)
EOSINOPHIL %: 2.1 % (ref 0.0–7.0)
HEMATOCRIT: 37.5 % (ref 34.1–44.9)
HEMOGLOBIN: 12.2 g/dL (ref 11.2–15.7)
IMMATURE GRANULOCYTE %: 0.3 % (ref 0.0–0.4)
LYMPHOCYTE %: 34.9 % (ref 15.0–54.0)
MEAN CORP HGB CONC: 32.5 g/dL (ref 31.0–37.0)
MEAN CORPUSCULAR HGB: 29.5 pg (ref 26.0–34.0)
MEAN CORPUSCULAR VOL: 90.6 fl (ref 80.0–100.0)
MEAN PLATELET VOLUME: 10.7 fL (ref 8.7–12.5)
MONOCYTE %: 5.3 % (ref 4.0–13.0)
NEUTROPHIL %: 56.9 % (ref 40.0–75.0)
NRBC %: 0 % (ref 0.0–0.0)
PLATELET COUNT: 259 10*3/uL (ref 150–400)
RBC DISTRIBUTION WIDTH STD DEV: 43.6 fL (ref 35.1–46.3)
RED BLOOD CELL COUNT: 4.14 M/uL (ref 3.90–5.20)
WHITE BLOOD CELL COUNT: 5.8 10*3/uL (ref 4.0–11.0)

## 2019-04-10 LAB — HEPATITIS C ANTIBODY: HEPATITIS C ANTIBODY: NONREACTIVE

## 2019-04-10 LAB — THYROID SCREEN TSH REFLEX FT4: THYROID SCREEN TSH REFLEX FT4: 5.63 u[IU]/mL — ABNORMAL HIGH (ref 0.358–3.740)

## 2019-04-10 LAB — LIPID PANEL
Cholesterol: 223 mg/dL (ref 0–239)
HIGH DENSITY LIPOPROTEIN: 43 mg/dL (ref 40–?)
LOW DENSITY LIPOPROTEIN DIRECT: 159 mg/dL (ref 0–189)
TRIGLYCERIDES: 169 mg/dL — ABNORMAL HIGH (ref 0–150)

## 2019-04-10 LAB — FREE THYROXINE: FREE THYROXINE: 1.13 ng/dL (ref 0.76–1.46)

## 2019-04-10 MED ORDER — CHLORTHALIDONE 50 MG PO TABS
50.0000 mg | ORAL_TABLET | Freq: Every day | ORAL | 3 refills | Status: DC
Start: 2019-04-10 — End: 2019-08-18

## 2019-04-10 MED ORDER — MECLIZINE HCL 25 MG PO TABS
25.0000 mg | ORAL_TABLET | Freq: Three times a day (TID) | ORAL | 0 refills | Status: DC | PRN
Start: 2019-04-10 — End: 2019-06-08

## 2019-04-10 MED ORDER — FAMOTIDINE 20 MG PO TABS
20.0000 mg | ORAL_TABLET | Freq: Two times a day (BID) | ORAL | 3 refills | Status: DC
Start: 2019-04-10 — End: 2019-07-07

## 2019-04-10 NOTE — Progress Notes (Signed)
Influenza and Pneumococcal Vaccine Procedure  April 10, 2019    1. Has the patient received the information for the influenza and pneumococcal vaccine? Yes    2. Does the patient have any of the following contraindications?  Allergy to eggs? No  Allergic reaction to previous influenza vaccines? No  Any other problems to previous influenza vaccines? No  Paralyzed by Guillain-Barre syndrome?  No  Current moderate or severe illness? No  Allergy to contact lens solution? No    3. The vaccines have been administered in the usual fashion and the patient/guardian was instructed to wait 20 minutes before leaving the building in the event of an allergic reaction:     Immunization information reviewed. Current VIS reviewed and given to patient/ guardian. Verbal assent obtained from patient/ guardian. Comfort measures for possible side effects reviewed.           Darrin Nipper , LPN, [id number]

## 2019-04-10 NOTE — Progress Notes (Signed)
Sara CorwinMaria Snyder is a 63 year old female  CC: dtr came in middle of visit. Lots of questions    Requests results of holter.  I can see the holter but not cardiologist interpretation.     Want to check for cholesterol, thryoid, anemia, sugar - PCP already put most of these in, except for thyroid. Notes she sometimes gets hot and nervous, usually when needs to cook and doesn't like cooking. Comes on all of a sudden, lasts a few minutes, resolves. PCP has noted this is hot flashes per chart review.     Pain in stomach, burning, acid, very strong. Takes omeprazole - goes and comes back again. Rarely has it. PCP rxd zantac but did not start it (off market).    Needs cervical ca screening. None x >10 y    Very anxious - missed a few appts with Marcos Dasilva bc did not get messages.     Pain all over but esp lower back, L>R and midline. Worse with movement. No BBI no fevers. X about 3-4 months. No leg weakness    Requests refill of meclizine - helpful for dizziness.     HTN - took all 3 BP meds today  Most Recent BP Reading(s)  04/10/19 : (!) 172/80  08/22/18 : 133/78  06/29/18 : (!) 152/86  05/23/18 : (!) 154/71  02/07/18 : (!) 165/77        Patient Active Problem List:     Essential hypertension     Labyrinthitis     Panic disorder     Hyperlipidemia     Chronic cough     Mild episode of recurrent major depressive disorder (HCC)     Allergic rhinitis     Need for prophylactic vaccination and inoculation against influenza     Left arm pain     Decreased GFR     Anxiety state     Palpitations     Psychophysiological insomnia      No family history on file.      Social History    Tobacco Use      Smoking status: Former Smoker        Packs/day: 2.00        Years: 25.00        Pack years: 6150        Quit date: 10/27/1977        Years since quitting: 41.4      Smokeless tobacco: Never Used    Alcohol use: Not Currently    Drug use: Never      valsartan (DIOVAN) 320 MG tablet, Take 1 tablet by mouth daily, Disp: 30  tablet, Rfl: 11  chlorthalidone (HYGROTEN) 25 MG tablet, Take 1 tablet by mouth daily, Disp: 90 tablet, Rfl: 6  atenolol (TENORMIN) 50 MG tablet, One tab in am and one tab in pm, Disp: 60 tablet, Rfl: 6  traZODone (DESYREL) 50 MG tablet, Take 1 tablet by mouth nightly, Disp: 30 tablet, Rfl: 11  clonazePAM (KLONOPIN) 1 MG tablet, Take 1 tablet by mouth 2 (two) times daily, Disp: 60 tablet, Rfl: 0  venlafaxine (EFFEXOR-XR) 37.5 MG 24 hr capsule, TAKE 1 CAPSULE DAILY WITH 75 MG. TDD=112.5MG , Disp: 30 capsule, Rfl: 5  omeprazole (PRILOSEC) 20 MG capsule, Take 1 capsule by mouth daily, Disp: 30 capsule, Rfl: 5  venlafaxine (EFFEXOR-XR) 75 MG 24 hr capsule, Take 1 capsule by mouth daily Take along with 37.5 mg. TDD=112.5 mg., Disp: 30 capsule, Rfl: 2  raNITIdine (  ZANTAC) 150 MG tablet, Take 1 tablet by mouth nightly, Disp: 30 tablet, Rfl: 11  Multiple Vitamins-Minerals (HAIR SKIN AND NAILS FORMULA) TABS, Take 1 capsule by mouth daily, Disp: 30 tablet, Rfl: 12  loratadine (CLARITIN) 10 MG tablet, Take 1 tablet by mouth daily, Disp: 30 tablet, Rfl: 11  calcium-vitamin D (OSCAL-500) 500-400 MG-UNIT per tablet, Take 1 tablet by mouth 2 (two) times daily, Disp: 60 tablet, Rfl: 11  azelastine (ASTELIN) 0.1 % nasal spray, 1 spray by Each Nostril route 2 (two) times daily Use in each nostril as directed, Disp: 30 mL, Rfl: 4  acetaminophen (TYLENOL) 500 MG tablet, Take 1 tablet by mouth every 6 (six) hours as needed for Pain, Disp: 30 tablet, Rfl: 6  fluticasone (FLONASE) 50 MCG/ACT nasal spray, 1 spray by Each Nostril route daily, Disp: 1 Bottle, Rfl: 5  fluticasone (FLOVENT HFA) 110 MCG/ACT inhaler, Inhale 1 puff into the lungs 2 (two) times daily  for 10 days, Disp: 1 Inhaler, Rfl: 0    No current facility-administered medications on file prior to visit.       Review of Patient's Allergies indicates:   Pollen extract          Cough      PE:   04/10/19  0900   BP: (!) 172/80   Site: Left Arm   Position: Sitting   Cuff Size:  Large   Pulse: 78   Resp: 18   Temp: 97.2 F (36.2 C)   TempSrc: Temporal   SpO2: 97%   Weight: 85.7 kg (189 lb)   Height: 4' 11.66" (1.515 m)   Per [facility] report repeat Bp was 156/84    Psych cooperative pleasant  Skin warm dry  Pelvic exam:  EGBUS within normal limits, normal cervix without lesions, polyps or tenderness, uterus normal size, shape, consistency, no mass or tenderness, adnexa normal in size without mass or tenderness, perineal normal,   MSK - no stepoff, no focal tendernes, + bilateral lumbar m tenderss and Left paraspinal tendeness    A/P:Sara Snyder is a 63 year old female  (Z11.51) Screening for HPV (human papillomavirus)  Comment:   Plan: CYTOPATH, C/V, THIN LAYER, OBTAINING SCREEN PAP        SMEAR        (I10) Essential hypertension  Comment: poor control  Plan: increase chlorthalidone to 50mg , monitor Cr    (R73.03) Prediabetes  Comment:   Plan: check today     (Z11.59) Need for hepatitis C screening test  Comment:   Plan: HEPATITIS C ANTIBODY            (Z12.11) Screening for colon cancer  Comment:   Plan: POC IMMUNOASSAY FECAL OCCULT BLOOD TEST            (Z23) Need for prophylactic vaccination and inoculation against influenza  Comment:   Plan: IIV4 VACC PRESERV FREE AGE 56 MONTHS AND OLDER,         0.5ML, IM          (Z23) Need for prophylactic vaccination against Streptococcus pneumoniae (pneumococcus)  Comment:   Plan: PCV13 VACCINE FOR INTRAMUSCULAR USE            (Z13.820) Screening for osteoporosis  Comment:   Plan: XR DXA BONE DENSITOMETRY            (R10.13) Epigastric abdominal pain  Comment:   Plan: HELICOBACTER PYLORI STOOL AG            (R23.2) Hot flashes  Comment: most likely   Plan: THYROID SCREEN TSH REFLEX FT4           (M54.5,  G89.29) Chronic midline low back pain without sciatica  Comment: c/w lumbar/paraspinal muscle strain on exam, no warning si/wy  Plan: REFERRAL TO PHYSICAL THERAPY ( INT), heat, gentle stretch prn           (H83.09) Labyrinthitis, unspecified  laterality  Comment: requests refill  Plan: meclizine (ANTIVERT) 25 MG TABS        Colon ca screening - ifob ordered  Osteoporosis screenign - dxa ordered    We discussed the patients medications. The patient was ready to learn and no apparent learning barriers were identified. I explained the diagnosis and treatment plan, and the patient expressed understanding of the content.   I attempted to answer any questions regarding the diagnosis and the proposed treatment.    At least 40  minutes were spent face to face with patient and more than 50% of the time was spent counseling the patient or coordinating care.

## 2019-04-10 NOTE — Telephone Encounter (Signed)
nurse from [place] called the [facility] Refill [facility] to complete a benefit analysis for the [id number] Vaccine.    The vaccine is covered under the patients [facility] medical coverage.    Please choose Private

## 2019-04-11 LAB — HEMOGLOBIN A1C
ESTIMATED AVERAGE GLUCOSE: 126 (ref 74–160)
HEMOGLOBIN A1C: 6 % — ABNORMAL HIGH (ref 4.0–5.6)

## 2019-04-11 LAB — HUMAN PAPILLOMAVIRUS (HPV): HUMAN PAPILLOMAVIRUS: NEGATIVE

## 2019-04-12 ENCOUNTER — Ambulatory Visit (HOSPITAL_BASED_OUTPATIENT_CLINIC_OR_DEPARTMENT_OTHER): Payer: No Typology Code available for payment source | Admitting: Internal Medicine

## 2019-04-12 ENCOUNTER — Ambulatory Visit (HOSPITAL_BASED_OUTPATIENT_CLINIC_OR_DEPARTMENT_OTHER): Payer: No Typology Code available for payment source | Admitting: "Psychiatric/Mental Health

## 2019-04-12 NOTE — Progress Notes (Signed)
Erroneous Encounter.  Called several times and no answer.

## 2019-04-14 ENCOUNTER — Encounter (HOSPITAL_BASED_OUTPATIENT_CLINIC_OR_DEPARTMENT_OTHER): Payer: Self-pay | Admitting: Internal Medicine

## 2019-04-14 ENCOUNTER — Other Ambulatory Visit (HOSPITAL_BASED_OUTPATIENT_CLINIC_OR_DEPARTMENT_OTHER): Payer: Self-pay | Admitting: Internal Medicine

## 2019-04-14 LAB — CYTOPATH, C/V, THIN LAYER

## 2019-04-17 ENCOUNTER — Telehealth (HOSPITAL_BASED_OUTPATIENT_CLINIC_OR_DEPARTMENT_OTHER): Payer: Self-pay | Admitting: Registered Nurse

## 2019-04-17 DIAGNOSIS — R7303 Prediabetes: Secondary | ICD-10-CM

## 2019-04-17 DIAGNOSIS — E781 Pure hyperglyceridemia: Secondary | ICD-10-CM

## 2019-04-17 DIAGNOSIS — F330 Major depressive disorder, recurrent, mild: Secondary | ICD-10-CM

## 2019-04-17 MED FILL — VENLAFAXINE 75MG ER: 30 days supply | Qty: 30 | Fill #2 | Status: CP

## 2019-04-17 MED FILL — VENLAFAXINE  37.5MG ER: 30 days supply | Qty: 30 | Fill #1 | Status: CP

## 2019-04-17 NOTE — Telephone Encounter (Signed)
TC to pt and LM on VM to call clinic back at [phone] during clinic hours M-F 8a - 5p    Pearla Dubonnet, RN, 04/17/2019 5:40 PM              Shaune Pascal, MD  [facility]              Dear RN,     Please:    1. Create Telephone encounter for this patient.   2. Share with the patient the attached results     1. Thyroid functions slightly borderline   2. Kidney functions stable   3. Sugars in the prediabetes range   4. Rest of the tests are all in the normal range       Plan:   1. Further tests are needed, future labs will be ordered and patient needs to return to have them done in 3 month(s). .   We need to repeat sugars and thyroid functions   Pt to watch her sugars : carbohydrate   If interested can talk to a nutritionist   Please watch for sugars, bread, rice,soda,   We will repeat the blood tests in 3 months   papsmear is normal      2. Type of Outreach: 3 phone calls and if unable to reach send letter     3. Document the conversation in the Telephone Encounter and close the encounter, no need to send back to me.     Thank you,   Shaune Pascal, MD

## 2019-04-17 NOTE — Progress Notes (Signed)
Dear RN,    Please:    1. Create Telephone encounter for this patient.  2. Share with the patient the attached results     1. Thyroid functions slightly borderline   2. Kidney functions stable   3. Sugars in the prediabetes range   4. Rest of the tests are all in the normal range       Plan:  1. Further tests are needed, future labs will be ordered and patient needs to return to have them done in 3 month(s). .  We need to repeat sugars and thyroid functions   Pt to watch her sugars : carbohydrate   If interested can talk to a nutritionist   Please watch for sugars, bread, rice,soda,  We will repeat the blood tests in 3 months   papsmear is normal     2. Type of Outreach: 3 phone calls and if unable to reach send letter     3. Document the conversation in the Telephone Encounter and close the encounter, no need to send back to me.     Thank you,  Shaune Pascal, MD

## 2019-04-18 ENCOUNTER — Inpatient Hospital Stay (HOSPITAL_BASED_OUTPATIENT_CLINIC_OR_DEPARTMENT_OTHER): Admission: RE | Admit: 2019-04-18 | Payer: Self-pay | Source: Ambulatory Visit

## 2019-04-18 ENCOUNTER — Ambulatory Visit (HOSPITAL_BASED_OUTPATIENT_CLINIC_OR_DEPARTMENT_OTHER): Payer: Self-pay | Admitting: Licensed Practical Nurse

## 2019-04-18 MED ORDER — VENLAFAXINE HCL ER 75 MG PO CP24
75.0000 mg | ORAL_CAPSULE | Freq: Every day | ORAL | 5 refills | Status: DC
Start: 2019-04-18 — End: 2019-08-18

## 2019-04-18 NOTE — Telephone Encounter (Signed)
TC to pt  Informed of results per Dr Bernell List message   Diet and exercise was reviewed  Would like to speak to a nutritionist  Plan to recheck labs in 3 months  Pt stated she is trying to lose weight  Asking if can see Endocrinologist to help with weight loss    Pt has been taking venlafaxine 37.5 mg two a day as ran out of venlafaxine 75 mg  Was told cannot see Darryl Lent anymore and is waiting for appt with Geriatrics  Reports she running low on other meds  RN called pharmacy and delivery is scheduled for today    Sara Dubonnet, RN, 04/18/2019 11:02 AM

## 2019-04-19 ENCOUNTER — Other Ambulatory Visit (HOSPITAL_BASED_OUTPATIENT_CLINIC_OR_DEPARTMENT_OTHER): Payer: Self-pay | Admitting: Registered Nurse

## 2019-04-19 MED FILL — VALSARTAN 320MG: 30 days supply | Qty: 30 | Fill #0 | Status: CP

## 2019-04-19 NOTE — Telephone Encounter (Signed)
Due to the language barrier, the phone call was conducted in [place] with an interpreter. The interpreter's name is Estill Bamberg [id number]. The interpreter was on the phone during the entire phone call.  Spoke with Verdis Frederickson informed both strengths of Venafaxine have been refilled  and nutrition referral has been placed  Verdis Frederickson also needs Valsartan and klonopin refilled -RN called and requested refill with home delivery valsartan   Verdis Frederickson aware klonopin refill request sent to provider  Spoke with Festus Aloe at [employer] [facility] [employer] for venafaxine were delivered yesterday-pt did not receive delivery  Festus Aloe will reach out to patient

## 2019-04-27 NOTE — Telephone Encounter (Signed)
Chart routed to provider that last wrote for Klonopin

## 2019-04-28 ENCOUNTER — Other Ambulatory Visit (HOSPITAL_BASED_OUTPATIENT_CLINIC_OR_DEPARTMENT_OTHER): Payer: Self-pay

## 2019-04-28 MED ORDER — CLONAZEPAM 1 MG PO TABS
1.0000 mg | ORAL_TABLET | Freq: Two times a day (BID) | ORAL | 0 refills | Status: DC
Start: 2019-04-28 — End: 2019-06-08

## 2019-04-28 MED FILL — CLONAZEPAM 1MG: 30 days supply | Qty: 60 | Fill #0

## 2019-05-08 ENCOUNTER — Ambulatory Visit: Payer: No Typology Code available for payment source | Attending: Internal Medicine | Admitting: Internal Medicine

## 2019-05-08 DIAGNOSIS — I10 Essential (primary) hypertension: Secondary | ICD-10-CM | POA: Diagnosis not present

## 2019-05-08 DIAGNOSIS — I493 Ventricular premature depolarization: Secondary | ICD-10-CM | POA: Diagnosis not present

## 2019-05-08 DIAGNOSIS — E785 Hyperlipidemia, unspecified: Secondary | ICD-10-CM | POA: Diagnosis not present

## 2019-05-08 DIAGNOSIS — R002 Palpitations: Secondary | ICD-10-CM | POA: Diagnosis present

## 2019-05-08 MED FILL — ATENOLOL 50MG: 30 days supply | Qty: 60 | Fill #0 | Status: CP

## 2019-05-08 NOTE — Progress Notes (Signed)
CARDIOLOGY TELEVISIT PROGRESS NOTE  Date of visit: 05/08/2019    Interpreter service was used: Yes , telephone [place] interpreter used.    Confirm Patient Identity Sara Snyder(Sara Snyder ; Nov 05, 1955)  In summary patient is 63 year old female with the following medical problems:   Palpitations  (primary encounter diagnosis)   PVC (premature ventricular contraction)   Essential hypertension   Hyperlipidemia, unspecified hyperlipidemia type    Diastolic dysfunction on ECHO       Patient 63 years was referred for concerns of SVT and PVCs on Holter.  Patient has past medical history significant for hypertension, hyperlipidemia.  Patient had a Holter and echocardiogram about 1 to 2 months ago which revealed grade 2 diastolic dysfunction on echo.  Primary care physician also has suspicion of SVT and VT on Holter, for which patient was referred to our cardiology clinic for evaluation.    Patient had a palpitation with panic attack for many years, once or twice a week.  She also complains chest pain around her right breast area.  Nonexertional.  She was found mass on mammogram.  Her blood pressure has been labile with SBP from 90-1 90.  She has lightheadedness with bending down.  No history of syncope.  She feels extremely fatigue and shortness of breath with doing minimal housework.  She does not do any regular exercise during the pandemic.      Current Outpatient Medications:     clonazePAM (KLONOPIN) 1 MG tablet, Take 1 tablet by mouth 2 (two) times daily, Disp: 60 tablet, Rfl: 0    venlafaxine (EFFEXOR-XR) 75 MG 24 hr capsule, Take 1 capsule by mouth daily Take along with 37.5 mg. TDD=112.5 mg., Disp: 30 capsule, Rfl: 5    famotidine (PEPCID) 20 MG tablet, Take 1 tablet by mouth 2 (two) times daily, Disp: 180 tablet, Rfl: 3    meclizine (ANTIVERT) 25 MG TABS, Take 1 tablet by mouth every 8 (eight) hours as needed, Disp: 90 tablet, Rfl: 0    chlorthalidone (HYGROTEN) 50 MG tablet, Take 1 tablet by  mouth daily, Disp: 90 tablet, Rfl: 3    valsartan (DIOVAN) 320 MG tablet, Take 1 tablet by mouth daily, Disp: 30 tablet, Rfl: 11    atenolol (TENORMIN) 50 MG tablet, One tab in am and one tab in pm, Disp: 60 tablet, Rfl: 6    traZODone (DESYREL) 50 MG tablet, Take 1 tablet by mouth nightly, Disp: 30 tablet, Rfl: 11    venlafaxine (EFFEXOR-XR) 37.5 MG 24 hr capsule, TAKE 1 CAPSULE DAILY WITH 75 MG. TDD=112.5MG , Disp: 30 capsule, Rfl: 5    omeprazole (PRILOSEC) 20 MG capsule, Take 1 capsule by mouth daily, Disp: 30 capsule, Rfl: 5    Multiple Vitamins-Minerals (HAIR SKIN AND NAILS FORMULA) TABS, Take 1 capsule by mouth daily, Disp: 30 tablet, Rfl: 12    loratadine (CLARITIN) 10 MG tablet, Take 1 tablet by mouth daily, Disp: 30 tablet, Rfl: 11    calcium-vitamin D (OSCAL-500) 500-400 MG-UNIT per tablet, Take 1 tablet by mouth 2 (two) times daily, Disp: 60 tablet, Rfl: 11    azelastine (ASTELIN) 0.1 % nasal spray, 1 spray by Each Nostril route 2 (two) times daily Use in each nostril as directed, Disp: 30 mL, Rfl: 4    acetaminophen (TYLENOL) 500 MG tablet, Take 1 tablet by mouth every 6 (six) hours as needed for Pain, Disp: 30 tablet, Rfl: 6    fluticasone (FLONASE) 50 MCG/ACT nasal spray, 1 spray by Each Nostril route daily,  Disp: 1 Bottle, Rfl: 5    fluticasone (FLOVENT HFA) 110 MCG/ACT inhaler, Inhale 1 puff into the lungs 2 (two) times daily  for 10 days, Disp: 1 Inhaler, Rfl: 0    Review of Patient's Allergies indicates:   Pollen extract          Cough    SOCIAL & FAMILY HISTORY: Were reviewed and are unchanged from my previous note.    Review of systems: All other systems reviewed were pertinently negative apart from the history of present illness.    PHYSICAL EXAM:  Not performed    PERTINENT INVESTIGATIONS:    1. Last EKG in our record: (on my personal review)   Vent. Rate : 059 BPM   Atrial Rate : 059 BPM    P-R Int : 168 ms     QRS Dur : 098 ms    QT Int : 424 ms       P-R-T Axes : 068  -15 -02 degrees    QTc Int : 419 ms        Sinus bradycardia    Moderate voltage criteria for LVH, may be normal variant    Borderline ECG    No previous ECGs available      2. LABS:   Lab Results   Component Value Date    NA 139 04/10/2019    K 4.0 04/10/2019    CL 104 04/10/2019    CO2 31 04/10/2019    BUN 19 (H) 04/10/2019    CREAT 1.1 04/10/2019    GLUCOSER 88 04/10/2019     Lab Results   Component Value Date    LDL 159 04/10/2019    HDL 43 04/10/2019    TG 169 (H) 04/10/2019     No results found for: PROBNP  No results found for: BNP    3. ECHOCARDIOGRAM March 16, 2019:   1. Left Ventricle: Global systolic function: EF is estimated visually at 60%.   2. Left Ventricle: Regional systolic function: Wall motion: There are no regional wall motion abnormalities.   3. Left Ventricle: There is grade II diastolic dysfunction (pseudonormal filling).   4. Right Ventricle: Normal size and systolic function.   5. Mitral Valve: Trace regurgitation.   6. Aortic Valve: There is no aortic stenosis. There is trace aortic regurgitation.   7. Tricuspid Valve: The RVSP is estimated at 31 mmHg which is normal.   8. Aorta: The aortic root is normal in size. Minimally dilated ascending aorta measuring 3.7 cm [2.2-3.6 cm].       4. STRESS TEST:   5. HOLTER in March 17, 2019:  Sinus at 70 with range 52 to 100 BPM.   Approximately 600 APC's = 0.6% of all beats, including 15 couplets and 7   short relatively slow runs.   Approximately 600 VPC's = 0.6% of all beats, including 2 couplets but no   runs.   No symptoms.           ASSESSMENT/ PLAN:    No orders of the defined types were placed in this encounter.      (R00.2) Palpitations  (primary encounter diagnosis)  Comment: Holter monitor was reviewed.  Ventricular ectopy revealed  0.6% VPCs without runs.  Patient already on atenolol.  No further treatment or work-up is indicated given the small burden of VPCs in a patient with no wall motion abnormality on echo and chronic  mild symptoms.  Will evaluate patient in person regarding her fatigue in  the setting of diastolic dysfunction on echo and to decide if further test is warranted.    (I49.3) PVC (premature ventricular contraction)  Comment: Same as above.    (I10) Essential hypertension  Comment: Liable blood pressure.  She is on chlorthalidone, atenolol and valsartan.  Asked patient to record blood pressure twice a day and reevaluate with in person visit in a month.    (E78.5) Hyperlipidemia, unspecified hyperlipidemia type  Comment: 10 year ASCVD risk 12.1%. Will qualify asprin and statin treatment for primary ASCVD prevention. Prefers to life style modification with diet and excercise and reevaluate at next appointment.     Return for follow up in Cardiology Clinic in 29month in person, but patient refused. To contact earlier if there are any cardiac related issues.    This Cardiology Division patient encounter note was created using voice-recognition software and in real time during the clinic visit. Please excuse any typographical errors that have not been edited out.     Electronically signed by: Adin Hector, PA-C, 05/08/2019 4:31 PM

## 2019-05-09 ENCOUNTER — Other Ambulatory Visit: Payer: Self-pay

## 2019-05-19 MED FILL — VENLAFAXINE 75MG ER: 30 days supply | Qty: 30 | Fill #0

## 2019-05-19 MED FILL — VENLAFAXINE  37.5MG ER: 30 days supply | Qty: 30 | Fill #2

## 2019-05-19 MED FILL — VALSARTAN 320MG: 30 days supply | Qty: 30 | Fill #1

## 2019-05-31 ENCOUNTER — Other Ambulatory Visit: Payer: Self-pay

## 2019-06-08 ENCOUNTER — Other Ambulatory Visit (HOSPITAL_BASED_OUTPATIENT_CLINIC_OR_DEPARTMENT_OTHER): Payer: Self-pay

## 2019-06-08 ENCOUNTER — Other Ambulatory Visit (HOSPITAL_BASED_OUTPATIENT_CLINIC_OR_DEPARTMENT_OTHER): Payer: Self-pay | Admitting: Internal Medicine

## 2019-06-08 ENCOUNTER — Ambulatory Visit (HOSPITAL_BASED_OUTPATIENT_CLINIC_OR_DEPARTMENT_OTHER): Payer: Self-pay | Admitting: Internal Medicine

## 2019-06-08 DIAGNOSIS — H8309 Labyrinthitis, unspecified ear: Secondary | ICD-10-CM

## 2019-06-08 NOTE — Telephone Encounter (Signed)
PER Pharmacy, Sara Snyder is a 63 year old female has requested a refill of Meclizine.      Last Office Visit: 04-10-2019 with MD  Last Physical Exam: na    FECAL OCCULT BLOOD AGE 65+ due on 12/03/2018    Other Med Adult:  Most Recent BP Reading(s)  04/10/19 : (!) 172/80        Cholesterol (mg/dL)   Date Value   04/10/2019 223     LOW DENSITY LIPOPROTEIN DIRECT (mg/dL)   Date Value   04/10/2019 159     HIGH DENSITY LIPOPROTEIN (mg/dL)   Date Value   04/10/2019 43     TRIGLYCERIDES (mg/dL)   Date Value   04/10/2019 169 (H)         THYROID SCREEN TSH REFLEX FT4 (uIU/mL)   Date Value   04/10/2019 5.630 (H)         No results found for: TSH    HEMOGLOBIN A1C (%)   Date Value   04/10/2019 6.0 (H)       No results found for: POCA1C      No results found for: INR    SODIUM (mmol/L)   Date Value   04/10/2019 139       POTASSIUM (mmol/L)   Date Value   04/10/2019 4.0           CREATININE (mg/dL)   Date Value   04/10/2019 1.1       Documented patient preferred pharmacies:    [place], [place] [facility] [place].  Phone: [phone] Fax: [phone]

## 2019-06-08 NOTE — Telephone Encounter (Signed)
PER Pharmacy, Sara Snyder is a 63 year old female has requested a refill of Clonazepam 1 mg     Last prescribed - start date: 04-28-2019 end date: 05-28-2019        Last [place] Office Visit: 04-12-2019  Dr. Felix Pacini  Last Physical Exam: na      Other Med Adult:  Most Recent BP Reading(s)  04/10/19 : Marland Kitchen 172/80        Cholesterol (mg/dL)   Date Value   04/10/2019 223     LOW DENSITY LIPOPROTEIN DIRECT (mg/dL)   Date Value   04/10/2019 159     HIGH DENSITY LIPOPROTEIN (mg/dL)   Date Value   04/10/2019 43     TRIGLYCERIDES (mg/dL)   Date Value   04/10/2019 169 (H)         THYROID SCREEN TSH REFLEX FT4 (uIU/mL)   Date Value   04/10/2019 5.630 (H)         No results found for: TSH    HEMOGLOBIN A1C (%)   Date Value   04/10/2019 6.0 (H)       No results found for: POCA1C      No results found for: INR    SODIUM (mmol/L)   Date Value   04/10/2019 139       POTASSIUM (mmol/L)   Date Value   04/10/2019 4.0           CREATININE (mg/dL)   Date Value   04/10/2019 1.1       Documented patient preferred pharmacies:    [place], [place] - [place].  Phone: [phone] Fax: [phone]

## 2019-06-09 MED FILL — MECLIZINE 25MG: 30 days supply | Qty: 90 | Fill #0

## 2019-06-09 MED FILL — CLONAZEPAM 1MG: 30 days supply | Qty: 60 | Fill #0

## 2019-06-12 ENCOUNTER — Ambulatory Visit (HOSPITAL_BASED_OUTPATIENT_CLINIC_OR_DEPARTMENT_OTHER): Payer: Self-pay | Admitting: Internal Medicine

## 2019-06-12 MED FILL — VENLAFAXINE 75MG ER: 30 days supply | Qty: 30 | Fill #1 | Status: CP

## 2019-06-12 MED FILL — VENLAFAXINE  37.5MG ER: 30 days supply | Qty: 30 | Fill #3 | Status: CP

## 2019-06-12 MED FILL — VALSARTAN 320MG: 30 days supply | Qty: 30 | Fill #2 | Status: CP

## 2019-06-19 ENCOUNTER — Encounter (HOSPITAL_BASED_OUTPATIENT_CLINIC_OR_DEPARTMENT_OTHER): Payer: Self-pay | Admitting: Physician Assistant

## 2019-07-05 ENCOUNTER — Ambulatory Visit: Payer: No Typology Code available for payment source | Attending: Psychiatry | Admitting: "Psychiatric/Mental Health

## 2019-07-05 DIAGNOSIS — F411 Generalized anxiety disorder: Secondary | ICD-10-CM | POA: Diagnosis present

## 2019-07-05 DIAGNOSIS — F330 Major depressive disorder, recurrent, mild: Secondary | ICD-10-CM | POA: Diagnosis not present

## 2019-07-05 DIAGNOSIS — F410 Panic disorder [episodic paroxysmal anxiety] without agoraphobia: Secondary | ICD-10-CM

## 2019-07-05 MED ORDER — CLONAZEPAM 1 MG PO TABS
ORAL_TABLET | ORAL | 2 refills | Status: DC
Start: 2019-07-05 — End: 2019-08-18

## 2019-07-05 MED ORDER — TRAZODONE HCL 50 MG PO TABS
ORAL_TABLET | ORAL | 2 refills | Status: DC
Start: 2019-07-05 — End: 2019-08-22

## 2019-07-05 MED FILL — TRAZODONE 50MG: 30 days supply | Qty: 30 | Fill #0

## 2019-07-05 NOTE — Progress Notes (Signed)
ADULT OUTPATIENT PSYCHIATRY - PSYCHOPHARM INITIAL EVALUATION    REFERRED BY:    Darryl Lent, APRN  [address]  [place],  [place] [address]    REASON FOR REFERRAL: psychopharm     CHIEF COMPLAINT:mood and sleep     HISTORY OF PRESENT ILLNESS:    Sara Snyder is assessed in a transfer Evaluation from [facility], [facility]. A telephone [place] interpreter was used. She carries diagnoses of MDD, recurrent, Panic D/O, and GAD. She denies psychiatric hospitalization, has had counseling in [place], and at [facility] though she dropped out of treatment. She has received psychopharm treatment at [facility] in the [facility].  She carries medical diagnoses of HTN, hyperlipidemia, allergic rhinitis, and palpitations.  She was born, raised, and lived her life in [place] until age 70 when she moved to the [name] to live with her daughters. She is married and has 5 children; two children live in [place] and 3 live in the [name] in [place].   She is tangentilal throughout the interview requiring refocus. She reports anxiety, depression, frustration, irritability, anger, and worry. She denies hopelessness, guilt, racing thoughts, rumination, or SI. Energy, motivation, and appetite are normal. She is not hypomanic or overtly psychotic. She reports chronic sleep disturbance. She stopped taking Trazodone b/c she believed it was causing her to be drowsy in the morning but she was taking 2 mg Klonopin with it. Discussed that it was probably the combination of both drugs that caused the morning somnolence. She believes the only thing that helps her is the Klonopin. Discuss using the Trazodone at night and the Klonopin during the day and only 1 mg at a time..      CURRENT MEDICATIONS:    Current Outpatient Medications   Medication Sig    traZODone (DESYREL) 50 MG tablet 1 tablet HS    clonazePAM (KLONOPIN) 1 MG tablet 1 tablet twice daily as needed    meclizine (ANTIVERT) 25 MG TABS Take 1 tablet by mouth every 8 (eight) hours as needed     venlafaxine (EFFEXOR-XR) 75 MG 24 hr capsule Take 1 capsule by mouth daily Take along with 37.5 mg. TDD=112.5 mg.    famotidine (PEPCID) 20 MG tablet Take 1 tablet by mouth 2 (two) times daily    chlorthalidone (HYGROTEN) 50 MG tablet Take 1 tablet by mouth daily    valsartan (DIOVAN) 320 MG tablet Take 1 tablet by mouth daily    atenolol (TENORMIN) 50 MG tablet One tab in am and one tab in pm    venlafaxine (EFFEXOR-XR) 37.5 MG 24 hr capsule TAKE 1 CAPSULE DAILY WITH 75 MG. TDD=112.5MG    omeprazole (PRILOSEC) 20 MG capsule Take 1 capsule by mouth daily    Multiple Vitamins-Minerals (HAIR SKIN AND NAILS FORMULA) TABS Take 1 capsule by mouth daily    loratadine (CLARITIN) 10 MG tablet Take 1 tablet by mouth daily    calcium-vitamin D (OSCAL-500) 500-400 MG-UNIT per tablet Take 1 tablet by mouth 2 (two) times daily    azelastine (ASTELIN) 0.1 % nasal spray 1 spray by Each Nostril route 2 (two) times daily Use in each nostril as directed    acetaminophen (TYLENOL) 500 MG tablet Take 1 tablet by mouth every 6 (six) hours as needed for Pain    fluticasone (FLONASE) 50 MCG/ACT nasal spray 1 spray by Each Nostril route daily    fluticasone (FLOVENT HFA) 110 MCG/ACT inhaler Inhale 1 puff into the lungs 2 (two) times daily  for 10 days  No current facility-administered medications for this visit.          LANGUAGE OF CARE:    [place]      LANGUAGE NEEDS MET:    Telephone Interpreter    VITAL SIGNS:   There were no vitals filed for this visit.    LABS:    WHITE BLOOD CELL COUNT (TH/uL)   Date Value   04/10/2019 5.8     RED BLOOD CELL COUNT (M/uL)   Date Value   04/10/2019 4.14     HEMOGLOBIN (g/dL)   Date Value   04/10/2019 12.2     No results found for: IDEALHCT  HEMATOCRIT (%)   Date Value   04/10/2019 37.5     No results found for: HCTDIFF  MEAN CORPUSCULAR VOL (fl)   Date Value   04/10/2019 90.6     MEAN CORPUSCULAR HGB (pg)   Date Value   04/10/2019 29.5     MEAN CORP HGB CONC (g/dL)    Date Value   04/10/2019 32.5     No results found for: RDW  PLATELET COUNT (TH/uL)   Date Value   04/10/2019 259     MEAN PLATELET VOLUME (fL)   Date Value   04/10/2019 10.7     NEUTROPHIL % (%)   Date Value   04/10/2019 56.9     LYMPHOCYTE % (%)   Date Value   04/10/2019 34.9     MONOCYTE % (%)   Date Value   04/10/2019 5.3     EOSINOPHIL % (%)   Date Value   04/10/2019 2.1     BASOPHIL % (%)   Date Value   04/10/2019 0.5           ALBUMIN (g/dL)   Date Value   04/10/2019 3.8     ALKALINE PHOSPHATASE (U/L)   Date Value   04/10/2019 117     ALANINE AMINOTRANSFERASE (U/L)   Date Value   04/10/2019 30     ASPARTATE AMINOTRANSFERASE (U/L)   Date Value   04/10/2019 21     Glucose Random (mg/dL)   Date Value   04/10/2019 88     BUN (UREA NITROGEN) (mg/dL)   Date Value   04/10/2019 19 (H)     CALCIUM (mg/dL)   Date Value   04/10/2019 9.1     CHLORIDE (mmol/L)   Date Value   04/10/2019 104     CARBON DIOXIDE (mmol/L)   Date Value   04/10/2019 31     CREATININE (mg/dL)   Date Value   04/10/2019 1.1     POTASSIUM (mmol/L)   Date Value   04/10/2019 4.0     SODIUM (mmol/L)   Date Value   04/10/2019 139     BILIRUBIN TOTAL (mg/dL)   Date Value   04/10/2019 0.3     TOTAL PROTEIN (g/dL)   Date Value   04/10/2019 7.4         THYROID SCREEN TSH REFLEX FT4   Date Value Ref Range Status   04/10/2019 5.630 (H) 0.358 - 3.740 uIU/mL Final     Comment:     **As per the Thyroid Screen protocol, an FT4 test is  automatically ordered for all patients with abnormal TSH  results unless the FT4 has already been performed on this  patient within the last 168 hours.        Cholesterol (mg/dL)   Date Value   04/10/2019 223  LOW DENSITY LIPOPROTEIN DIRECT (mg/dL)   Date Value   04/10/2019 159     HIGH DENSITY LIPOPROTEIN (mg/dL)   Date Value   04/10/2019 43     TRIGLYCERIDES (mg/dL)   Date Value   04/10/2019 169 (H)       HEMOGLOBIN A1C (%)   Date Value   04/10/2019 6.0 (H)     No results found for: POCA1C    No results found for: LITHIUM,  RPR  No results found for: HIVAB  No results found for: VAL  No results found for: CLOZ    CURRENT TREATMENT/CONTACT INFO FOR OTHER AGENCIES [place] (if applicable):    Contact/Community Support    No documentation.         MEDICAL HISTORY:  Past Medical History:  09/11/2018: Chronic kidney disease, stage III (moderate)  No date: HTN (hypertension)  01/05/2018: Mild episode of recurrent major depressive disorder ([place])  11/29/2017: Panic disorder  No date: Wears eyeglasses     PROBLEM LIST:  Patient Active Problem List:     Essential hypertension     Labyrinthitis     Panic disorder     Hyperlipidemia     Chronic cough     Mild episode of recurrent major depressive disorder (HCC)     Allergic rhinitis     Need for prophylactic vaccination and inoculation against influenza     Left arm pain     Decreased GFR     Anxiety state     Palpitations     Psychophysiological insomnia      BIOLOGICAL FAMILY HISTORY/FAMILY CONSTELLATION:    unknown per patient     PAST PSYCHIATRIC HISTORY:  Hospitalization-denies-denies   Counseling-[facility] in 2020 w/ D. Marcos Dasilva-multiple no shows-denies she dropped out of therapy; some counseling in [place].  Psychiatry-Section-E.Reuel Derby RNCS-2020 til present; also some no shows  History- Depression-began age 71; Anxiety-began age 82;  SI/attempts-no; Psychosis-denies.      TRAUMA:  Mental, physical, sexual abuse-denies    PSYCHOSOCIAL:  Born and raised in [place];  Mother-died age 42 y/o;  Father- "never knew my father". " Grew up with my aunt and uncle"; aunt died when she was 92 y/o. Siblings - none; then says 3 sisters from parents  Years of education - 2 years. 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   Came to [name] at age 21 y/o. Came to [name]  with a visa and a passport.  Married at age 62 y/o, married many years; Husband- Olga Millers, age 40  Children  - 51, sons-1; daughters-4; 1 son and 1 daughter family members  live in [place]; 3 daughters live with patient; 2 married and  youngest is single; also lives with 2 grandchildren ages 33 y/o and 48 y/o. 7 family members living in the [place].  Grandchildren - 3   Lives in an apartment in [place] for past 2  years.                    SUBSTANCE USE:   ETOH-no  Drugs-no  Nicotine-no; but did smoke 50 yrs ago        [place] RISK ASSESSMENTS:   Suicide:  C-SSRS OP Triage Screener - 07/06/19 0015        C-SSRS OP Triage Screener    Within the past month, have you wished you were dead or wished you could go to sleep and not wake up?   No     Within  the past month, have you had any actual thoughts of killing yourself?   No     In your lifetime, have you ever done anything, started to do anything, or prepared to do anything to end your life?  No     C-SSRS OP Triage Screener Risk Score  No Risk     C-SSRS OP Triage Screener Risk Score  0         C-SSRS Risk Assessment    No documentation.         VIOLENCE:   Violence/Abuse Risk    No documentation.         PROTECTIVE FACTORS:  Protective Factors    No documentation.         RISK ASSESSMENTS:     Violence: low (1)     Addiction: low (1)      MENTAL STATUS EXAMINATION:  Pleasant; speech-nl rate/tone; attent/conc-fair; mood-"anxious and nervous, and depression"; depression-yes; anxiety-yes; panic-no; irritability/impatience-yes;anger-yes;anhedonia-no; frustration-yes; sleep-to bed at 44M-initial insomnia-some, wakings-for BR, EMA-no, difficulty w/ OOB and facing the day-up at 10-11AM; appetite-nl; energy-nl; motivation-nl; isol/withdr-no; fatigue-naps; hopelessness-no; guilt-no;no-SI/HI. Coherent but often tangential and needing refocus;  thought process/associations-fairly organized; thought content-no overt psychosis; fund of knowledge-grossly intact; memory-grossly intact; racing thoughts-no; rumination-no;worry-some; no-AH,VH,CH,OH,TH;paranoia-no;delusions-no; IOR-no; thought broadcasting/insertion/removal-no; A+Ox3. I/J-nl       BIO/PSYCHO/SOCIAL AND RISK FORMULATION(S):  64 y/o [place] female  with longstanding MDD, recurrent, GAD, and Panic D/O now in need of psychopharm follow up,and counseling though she has dropped out of same.    DIAGNOSES:     Mild episode of recurrent major depressive disorder (HCC)  (primary encounter diagnosis)   SNOMED CT(R): RECURRENT MAJOR DEPRESSIVE EPISODES, MILD     GAD (generalized anxiety disorder)   SNOMED CT(R): GENERALIZED ANXIETY DISORDER     Panic disorder   SNOMED CT(R): PANIC DISORDER      PLAN: Continue meds as ordered. Will not take Klonopin 2 mg HS as she has been doing and will take Klonopin PRN during the day at 1 mg/dose at a time. She will take Trazodone for sleep. Next appt-2/24  Effexor XR 112.5 mg QD  Trazodone 50 mg HS #30-2 refills  Klonopin 1 mg twice daily, PRN #60-2 refills    INFORMED CONSENT - for any new medication:   N/A     CARE PLAN:  No linked episodes    Felix Pacini, RNCS

## 2019-07-06 ENCOUNTER — Encounter (HOSPITAL_BASED_OUTPATIENT_CLINIC_OR_DEPARTMENT_OTHER): Payer: Self-pay | Admitting: "Psychiatric/Mental Health

## 2019-07-07 ENCOUNTER — Ambulatory Visit: Payer: No Typology Code available for payment source | Attending: Internal Medicine | Admitting: Physician Assistant

## 2019-07-07 ENCOUNTER — Telehealth (HOSPITAL_BASED_OUTPATIENT_CLINIC_OR_DEPARTMENT_OTHER): Payer: Self-pay

## 2019-07-07 DIAGNOSIS — R413 Other amnesia: Secondary | ICD-10-CM

## 2019-07-07 DIAGNOSIS — R7303 Prediabetes: Secondary | ICD-10-CM | POA: Diagnosis present

## 2019-07-07 DIAGNOSIS — I10 Essential (primary) hypertension: Secondary | ICD-10-CM | POA: Diagnosis present

## 2019-07-07 DIAGNOSIS — R7989 Other specified abnormal findings of blood chemistry: Secondary | ICD-10-CM

## 2019-07-07 DIAGNOSIS — N1831 Chronic kidney disease, stage 3a: Secondary | ICD-10-CM

## 2019-07-07 DIAGNOSIS — K219 Gastro-esophageal reflux disease without esophagitis: Secondary | ICD-10-CM

## 2019-07-07 MED ORDER — OMEPRAZOLE 20 MG PO CPDR
20.0000 mg | DELAYED_RELEASE_CAPSULE | Freq: Every day | ORAL | 5 refills | Status: DC
Start: 2019-07-07 — End: 2020-07-25

## 2019-07-07 MED ORDER — VALSARTAN 320 MG PO TABS
320.0000 mg | ORAL_TABLET | Freq: Every day | ORAL | 11 refills | Status: DC
Start: 2019-07-07 — End: 2019-08-18

## 2019-07-07 MED FILL — VALSARTAN 320MG: 30 days supply | Qty: 30 | Fill #0

## 2019-07-07 MED FILL — OMEPRAZOLE   20MG: 30 days supply | Qty: 30 | Fill #0

## 2019-07-07 NOTE — Progress Notes (Signed)
[facility]    Chief Concern: Calls regarding high home BP readings and memory concerns    History of Present Illness: Sara Snyder is a 64 year old female who calls to discuss multiple concerns.     Reports high home BP readings, uses a wrist monitor. Reports BP of 180/90 last night. Denies any chest pain, headache or dizziness. Admits she takes care of her grandchildren and at times it can be agitating as they have a lot on energy and are indoors due to pandemic. Wondering if this affects BP    Concerned about memory- feels like she is forgetting certain words, and whether or not she has taken her daily meds. Has a pill box but does not use it properly- States she has many meds so she keeps them in bottles on top of pill box.     Abdominal pain x 3 months, burning and constant pain, epigastric, "feels swollen and belly is extending".    Of note, patient needed frequent redirecting as she often changed subject or spoke too long for interpretor.     Significant Past Medical History:   Patient Active Problem List:     Essential hypertension     Labyrinthitis     Panic disorder     Hyperlipidemia     Chronic cough     Mild episode of recurrent major depressive disorder (HCC)     Allergic rhinitis     Need for prophylactic vaccination and inoculation against influenza     Left arm pain     Decreased GFR     Anxiety state     Palpitations     Psychophysiological insomnia      Allergies:   Review of Patient's Allergies indicates:   Pollen extract          Cough    Medications:       traZODone (DESYREL) 50 MG tablet, 1 tablet HS, Disp: 30 tablet, Rfl: 2      clonazePAM (KLONOPIN) 1 MG tablet, 1 tablet twice daily as needed, Disp: 60 tablet, Rfl: 2      meclizine (ANTIVERT) 25 MG TABS, Take 1 tablet by mouth every 8 (eight) hours as needed, Disp: 90 tablet, Rfl: 2      venlafaxine (EFFEXOR-XR) 75 MG 24 hr capsule, Take 1 capsule by mouth daily Take along with 37.5 mg. TDD=112.5 mg., Disp: 30 capsule,  Rfl: 5      chlorthalidone (HYGROTEN) 50 MG tablet, Take 1 tablet by mouth daily, Disp: 90 tablet, Rfl: 3      [DISCONTINUED] famotidine (PEPCID) 20 MG tablet, Take 1 tablet by mouth 2 (two) times daily, Disp: 180 tablet, Rfl: 3      atenolol (TENORMIN) 50 MG tablet, One tab in am and one tab in pm, Disp: 60 tablet, Rfl: 6      [DISCONTINUED] valsartan (DIOVAN) 320 MG tablet, Take 1 tablet by mouth daily, Disp: 30 tablet, Rfl: 11      venlafaxine (EFFEXOR-XR) 37.5 MG 24 hr capsule, TAKE 1 CAPSULE DAILY WITH 75 MG. TDD=112.5MG , Disp: 30 capsule, Rfl: 5      [DISCONTINUED] omeprazole (PRILOSEC) 20 MG capsule, Take 1 capsule by mouth daily, Disp: 30 capsule, Rfl: 5      Multiple Vitamins-Minerals (HAIR SKIN AND NAILS FORMULA) TABS, Take 1 capsule by mouth daily, Disp: 30 tablet, Rfl: 12      loratadine (CLARITIN) 10 MG tablet, Take 1 tablet by mouth daily, Disp: 30 tablet, Rfl: 11  calcium-vitamin D (OSCAL-500) 500-400 MG-UNIT per tablet, Take 1 tablet by mouth 2 (two) times daily, Disp: 60 tablet, Rfl: 11      azelastine (ASTELIN) 0.1 % nasal spray, 1 spray by Each Nostril route 2 (two) times daily Use in each nostril as directed, Disp: 30 mL, Rfl: 4      acetaminophen (TYLENOL) 500 MG tablet, Take 1 tablet by mouth every 6 (six) hours as needed for Pain, Disp: 30 tablet, Rfl: 6      fluticasone (FLONASE) 50 MCG/ACT nasal spray, 1 spray by Each Nostril route daily, Disp: 1 Bottle, Rfl: 5      [DISCONTINUED] fluticasone (FLOVENT HFA) 110 MCG/ACT inhaler, Inhale 1 puff into the lungs 2 (two) times daily  for 10 days, Disp: 1 Inhaler, Rfl: 0    No current facility-administered medications on file prior to visit.       Review of Systems:   Pertinent positives and negatives as detailed in HPI.     Vital Signs:  Tele-visit - not performed  There were no vitals taken for this visit.  Pain Score: Data Unavailable    Physical Exam:   Tele-visit - Patient sounds comfortable on the phone, speaking in full and  complete sentences. Without shortness of breath, coughing, or audible wheezing.     Assessment/Plan:  1. Essential hypertension  - Low sodium diet and exercise recommended  - In person visit to assess BP and labs.  - valsartan (DIOVAN) 320 MG tablet; Take 1 tablet by mouth daily  Dispense: 30 tablet; Refill: 11 Refilled as requested    2. Prediabetes  - HEMOGLOBIN A1C; Future    3. Chronic GERD  - Anti-reflux measures discussed including the following:  - Avoid spicy and acidic foods, fatty foods, mints, chocolates, alcohol, and caffeinated beverages such as coffee or sodas  - Wait at least an hour after eating before lying down  - Avoid NSAIDs, tight-fitting garments, and smoking  - omeprazole (PRILOSEC) 20 MG capsule; Take 1 capsule by mouth daily  Dispense: 30 capsule; Refill: 5. Advised to take correctly, first thing in the morning 0 mins before first meal    4. Stage 3a chronic kidney disease  - Advised to avoid nephrotoxins and maintain adequate hydration  - BASIC METABOLIC PANEL; Future    5. Memory change  - VITAMIN B12; Future  - VITAMIN D,25 HYDROXY; Future  - REFERRAL TO NEUROLOGY ( INT)    6. Abnormal thyroid screen (blood)  - THYROID SCREEN TSH REFLEX FT4; Future    Screening for possible COVID:    1. In the past 14 days, has the patient had new or worsening (for chronic symptoms) fever, cough, shortness of breath, body aches, runny nose, sore throat, nausea, diarrhea, or lack of smell/taste? No   2. In the past 14 days, has the patient had COVID or suspected COVID?  No    Diagnosis and plan discussed with patient, questions answered to patient's satisfaction.    Return to clinic sooner if problem persists, or new problems arise.    Felton Clinton, PA-C

## 2019-07-07 NOTE — Telephone Encounter (Signed)
LVM to schedule in person appt. Await call back.    Avelina Laine, 07/07/2019    in person  Received: Today  Message Contents   Felton Clinton, PA-C  [facility] Support Staff Pool            Anaiza Behrens   [phone]     Please screen for COVID and schedule this patient for an in-person visit at [facility]     Reason for visit: High home BP/lab update/memory issues   Schedule with: Any provider from the patient's "team"   Time frame: Within 2 weeks   If requested appointment not available within the requested time frame: Route chart back to referring provider   I have ordered labs, please add "RELEASE FUTURE LABS" to appointment notes Yes     ** Providers should route this request to their home clinic front desk **

## 2019-07-19 ENCOUNTER — Other Ambulatory Visit: Payer: Self-pay

## 2019-07-20 ENCOUNTER — Ambulatory Visit: Payer: No Typology Code available for payment source | Attending: Internal Medicine | Admitting: Internal Medicine

## 2019-07-20 ENCOUNTER — Telehealth (HOSPITAL_BASED_OUTPATIENT_CLINIC_OR_DEPARTMENT_OTHER): Payer: Self-pay | Admitting: Internal Medicine

## 2019-07-20 ENCOUNTER — Telehealth (HOSPITAL_BASED_OUTPATIENT_CLINIC_OR_DEPARTMENT_OTHER): Payer: Self-pay

## 2019-07-20 ENCOUNTER — Other Ambulatory Visit: Payer: Self-pay

## 2019-07-20 ENCOUNTER — Encounter (HOSPITAL_BASED_OUTPATIENT_CLINIC_OR_DEPARTMENT_OTHER): Payer: Self-pay | Admitting: Internal Medicine

## 2019-07-20 VITALS — BP 180/92 | HR 68 | Temp 97.2°F | Resp 18 | Ht 59.0 in | Wt 188.0 lb

## 2019-07-20 DIAGNOSIS — R7989 Other specified abnormal findings of blood chemistry: Secondary | ICD-10-CM | POA: Insufficient documentation

## 2019-07-20 DIAGNOSIS — I10 Essential (primary) hypertension: Secondary | ICD-10-CM | POA: Insufficient documentation

## 2019-07-20 DIAGNOSIS — R7303 Prediabetes: Secondary | ICD-10-CM

## 2019-07-20 DIAGNOSIS — F330 Major depressive disorder, recurrent, mild: Secondary | ICD-10-CM

## 2019-07-20 DIAGNOSIS — R413 Other amnesia: Secondary | ICD-10-CM

## 2019-07-20 DIAGNOSIS — E039 Hypothyroidism, unspecified: Secondary | ICD-10-CM

## 2019-07-20 DIAGNOSIS — E038 Other specified hypothyroidism: Secondary | ICD-10-CM

## 2019-07-20 LAB — BASIC METABOLIC PANEL
ANION GAP: 6 mmol/L (ref 5–15)
BUN (UREA NITROGEN): 17 mg/dL (ref 7–18)
CALCIUM: 9 mg/dL (ref 8.5–10.1)
CARBON DIOXIDE: 30 mmol/L (ref 21–32)
CHLORIDE: 104 mmol/L (ref 98–107)
CREATININE: 1.3 mg/dL — ABNORMAL HIGH (ref 0.4–1.2)
ESTIMATED GLOMERULAR FILT RATE: 43 mL/min — ABNORMAL LOW (ref >60)
Glucose Random: 99 mg/dL (ref 74–160)
POTASSIUM: 3.9 mmol/L (ref 3.5–5.1)
SODIUM: 140 mmol/L (ref 136–145)

## 2019-07-20 LAB — THYROID SCREEN TSH REFLEX FT4: THYROID SCREEN TSH REFLEX FT4: 4.71 u[IU]/mL — ABNORMAL HIGH (ref 0.358–3.740)

## 2019-07-20 LAB — VITAMIN D,25 HYDROXY: VITAMIN D,25 HYDROXY: 19 ng/mL — CL (ref 30.0–100.0)

## 2019-07-20 LAB — VITAMIN B12: VITAMIN B12: 329 pg/mL (ref 193–986)

## 2019-07-20 LAB — PTH INTACT WITH CALCIUM
PTH INTACT CALCIUM: 9 mg/dL (ref 8.5–10.1)
PTH INTACT: 155.4 pg/mL — ABNORMAL HIGH (ref 18.5–88.0)

## 2019-07-20 LAB — FREE THYROXINE: FREE THYROXINE: 1.07 ng/dL (ref 0.76–1.46)

## 2019-07-20 NOTE — Telephone Encounter (Signed)
Due to the language barrier, the phone call was conducted in [place] with an interpreter. The interpreter's name is Ellyn Hack. The interpreter was on the phone during the entire phone call.    Left message on VM to return our call back to the clinic at [phone]  [facility], RN, 07/20/2019 2:33 PM

## 2019-07-20 NOTE — Telephone Encounter (Signed)
LVM to schedule TV with PCP. Await call back.    Sara Snyder, 07/20/2019    Message  Received: Today  Message Contents   Sara Heir, MD  [facility] Support Staff Pool            Please schedule follow-up t/v with Dr. Eather Colas in 1-2 weeks for labile blood pressure and results.   Thanks,   Sara Snyder

## 2019-07-20 NOTE — Telephone Encounter (Signed)
Patient of Dr. Eather Colas seen in clinic today.   Please call her to let her know that:     I reviewed the chart more after the visit and realized she was already seen by cardiology for her high blood pressure (November 2020). They recommended that she check it twice daily at home x 1 month, then come in for an office visit.     She refused.     In addition to the follow-up visit with Dr. Eather Colas, she should schedule an in person visit with cardiology to address her blood pressure.     If she agrees, please give her the phone number to med spec to schedule.

## 2019-07-20 NOTE — Progress Notes (Signed)
[facility] Office Visit    Reason for visit: labs/elevated BP    [name] interpreter, Iris, interpreted by phone for the entire visit.  05/16/19: cardiology t/v with Sara Hector, PA-C    07/20/19: t/v with RR. PA-C for elevated BP and memory concerns    Today she presents for in person evaluation of BP and labs.     Most Recent BP Reading(s)  07/20/19 : (!) 148/81  04/10/19 : (!) 172/80  08/22/18 : 133/78  06/29/18 : (!) 152/86  05/23/18 : (!) 154/71    Most Recent Weight Reading(s)  07/20/19 : 85.3 kg (188 lb)  04/10/19 : 85.7 kg (189 lb)  08/22/18 : 83.5 kg (184 lb)  06/29/18 : 81.2 kg (179 lb)  05/23/18 : 80.7 kg (178 lb)    Reports very high BP readings at home using wrist cuff.  Today she confirms she is taking valsartan 320 mg once daily, atenolol 50 mg BID, and chlorthalidone 50 mg daily.   If her BP is elevated, she takes an additional dose of chlorthalidone.       Review of Patient's Allergies indicates:   Pollen extract          Cough    Social History    Social History Narrative      Originally from [place], [place]      History of childhood trauma      Lives with family      OBJECTIVE:  BP (!) 180/92 (Site: RA, Cuff Size: Lrg)    Pulse 68    Temp 97.2 F (36.2 C) (Temporal)    Resp 18    Ht 4\' 11"  (1.499 m)    Wt 85.3 kg (188 lb)    SpO2 98%    BMI 37.97 kg/m    Repeat Blood Pressure:  BP Pulse Site Cuff Size Time Date   (!) 148/81 68 Left Arm Regular  8:10 AM 07/20/2019   (!) 180/90 --- Right Arm Large 12:22 PM 07/20/2019   (!) 180/92 --- Right Arm Large 12:23 PM 07/20/2019     Peak Flow Information  Peak Flow Resp Time Date   --- 18  8:10 AM 07/20/2019   No orthostatic vitals data filed.  No pain information filed.      Appearance: anxious    Labs:  CREATININE (mg/dL)   Date Value   04/10/2019 1.1   08/22/2018 1.3 (H)   02/07/2018 1.1     HEMOGLOBIN A1C (%)   Date Value   04/10/2019 6.0 (H)   08/22/2018 5.8 (H)   02/07/2018 5.8 (H)       ASSESSMENT/PLAN:  (I10) Essential  hypertension  (primary encounter diagnosis)  65 yo F with labile BP.   Difficult to ascertain an accurate blood pressure today as she is quite agitated during the visit.   Notably, card t/v 11/24 and was advised to record home BP twice daily and follow-up in 1 month. Patient refused recommended in person cardiology clinic appointment.   She counseled her to stop taking extra doses of chlorthalidone.   I did recommend she take a single dose of atenolol 50 mg in the office today due to her very high blood pressure.    Plan: REFERRAL TO PHARMACOTHERAPY SERVICES (INT), PTH        INTACT WITH CALCIUM    (R73.03) Prediabetes  recheck  Plan: HEMOGLOBIN A1C    (R41.3) Memory change  Labs today.   If labs normal, consider neuropsych  evaluation.   PMHT/geriatric psychiatry following.   She is requesting quetiapine from me today. I asked her to discuss this with her mental health psychopharm providers.    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

## 2019-07-21 LAB — HEMOGLOBIN A1C
ESTIMATED AVERAGE GLUCOSE: 123 (ref 74–160)
HEMOGLOBIN A1C: 5.9 % — ABNORMAL HIGH (ref 4.0–5.6)

## 2019-07-21 NOTE — Telephone Encounter (Signed)
TC to pt  Informed of Dr Cardell Peach message and plan  Pt stated she is checking her BP daily   Provided number to call and schedule appt with Cardio  Reminded pt of appt with Dr Eather Colas 08/02/19  Verbalized understanding  No further questions    Dairl Ponder, RN, 07/21/2019 10:13 AM

## 2019-07-24 LAB — RPR: RPR QUAL: NONREACTIVE

## 2019-08-02 ENCOUNTER — Ambulatory Visit (HOSPITAL_BASED_OUTPATIENT_CLINIC_OR_DEPARTMENT_OTHER): Payer: No Typology Code available for payment source | Admitting: Internal Medicine

## 2019-08-04 ENCOUNTER — Other Ambulatory Visit (HOSPITAL_BASED_OUTPATIENT_CLINIC_OR_DEPARTMENT_OTHER): Payer: Self-pay | Admitting: Pediatrics

## 2019-08-08 ENCOUNTER — Telehealth (HOSPITAL_BASED_OUTPATIENT_CLINIC_OR_DEPARTMENT_OTHER): Payer: Self-pay | Admitting: Registered Nurse

## 2019-08-08 NOTE — Addendum Note (Signed)
Addended by: Overton Mam on: 08/08/2019 01:38 PM     Modules accepted: Orders

## 2019-08-08 NOTE — Telephone Encounter (Signed)
-----   Message from Overton Mam, MD sent at 08/08/2019  1:20 PM EST -----  This patient may have multiple issues going on   Please ignore my previous message  This is a complicated patient   Please let her know that I need to see her in person again with her daughter or someone so that we can all be on the same page   That appointment should be  60 min please     Thanks   Aparna

## 2019-08-08 NOTE — Telephone Encounter (Signed)
Elouise Munroe, RN, 08/08/2019  TC to pt's daughter.  Agreeable to an inpatient visit with mom to discuss several issues.  Booked for 08/21/19 for 1 hour.

## 2019-08-14 ENCOUNTER — Ambulatory Visit
Admission: RE | Admit: 2019-08-14 | Discharge: 2019-08-14 | Disposition: A | Discharge status: Home / Self Care | Payer: No Typology Code available for payment source | Attending: Family Medicine | Admitting: Family Medicine

## 2019-08-14 ENCOUNTER — Telehealth (HOSPITAL_BASED_OUTPATIENT_CLINIC_OR_DEPARTMENT_OTHER): Payer: Self-pay | Admitting: Registered Nurse

## 2019-08-14 DIAGNOSIS — U071 COVID-19: Secondary | ICD-10-CM | POA: Diagnosis present

## 2019-08-14 DIAGNOSIS — Z20822 Contact with and (suspected) exposure to covid-19: Secondary | ICD-10-CM

## 2019-08-14 HISTORY — DX: COVID-19: U07.1

## 2019-08-14 LAB — COVID-19 OUTPATIENT: COVID-19 OUTPATIENT: POSITIVE

## 2019-08-14 NOTE — Telephone Encounter (Signed)
COVID CONTACT CALL:    I have confirmed the patient's name and DOB. Yes     Description of COVID contact:    Pt daughter tested positive for covid.    Review of COVID symptoms (if the patient has a symptom, the patient should be triaged and tested):    1. Fever? No  2. New or worsening cough? Yes  3. New or worsening SOB? No  4. New myalgias (muscle aches)? Yes, pain in her arm.   5. Anosmia (lack of smell/taste)? No  6. New sore throat? No  7. New runny nose? No  8. New diarrhea? No    Review of counseling:     Patients who have a known close contact (< 6 feet for > 10 minutes) should be tested   Patients who have a known close contact should quarantine   o If able to avoid contact: For 14 days from the date of last contact with the COVID patient  o If unable to avoid contact: For 14 days from the date the COVID patient no longer has to isolate at home (minimum 10 days from symptom onset + 14 days)    Disposition:    1. I have provided the above COVID contact counseling Yes  2. I have ordered testing Yes  3. I have counseled the patient on how to isolate in the same home from a person with COVID, if applicable (use COVIDHOMECARE): Yes  4. I have counseled the patient to call back and be retested if she develops symptoms  Yes  5. If the patient has an in-person appointment scheduled in the next 14 days (other than [facility]), I have instructed the patient that she should not attend, and have sent a message to the pertinent front desk pool:  Yes.    COVID HOME CARE ADVICE:    The following instructions are intended for patients who have been have Covid, have a Covid test pending, or who may have Covid but cannot be tested.    Treating symptoms and anticipatory guidance:     There are no specific therapies for Covid (coronavirus) in the outpatient setting.   Continue to take your prescribed medicines.   Do not take other medicines unless they have been prescribed.   Rest and take plenty of fluids.   For  symptomatic relief, use acetaminophen or ibuprofen, as long as you have not been told you cannot take these medicines.   Patients with worsening symptoms should:  ? Call [phone] M-Sa 8-5 or their home clinic phone number.  ? Call 911 in an emergency.    Self-isolation and self-quarantine precautions:    The following advice applies to all patients who have been tested for Covid while their result is pending, and to all patients who test positive for Covid.     Self-isolation directions for patients:  ? You should not leave home -- no work, school, public places, stores, public transportation. No exceptions except emergencies.  ? If possible, stay in one room of the house by yourself, ideally with a window open. Use a separate bathroom if available. Household members should sleep in a separate room.  ? Duration of self-isolation: AT LEAST 10 days since first day of symptoms AND at least 24 hours since resolution of fever without antipyretics AND improvement of other symptoms. If you were hospitalized for COVID, we recommend that you stay home for at least 20 days from your first day of symptoms (this time includes the time  you were in the hospital).   Self-quarantine directions for household members and close contacts:  ? Patients should notify all close contacts that they have symptoms of Covid. Close contacts include anyone with whom the patient spent significant time (> 15 minutes in a 24-hour period, less than 6 feet apart) since becoming symptomatic. This includes co-workers in [employer], etc. Close contacts must also self-quarantine.  ? All close contacts should be tested. Even if they test negative, they must continue to quarantine. If at any time they develop symptoms, they should be retested.  ? All household members should stay home for 14 days from the patient's first date of symptoms (or first day of positive test, if asymptomatic) -- no work, school, public places, stores, public  transportation. Note: If contacts are unable to isolate from the Covid patient, they must quarantine for 14 days from the last day that the patient with Covid is contagious (i.e.: quarantine for 10 days while the patient is contagious, and then 14 more days).  ? If you must end quarantine at Day 10, you may do so if you are a) asymptomatic and b) continue to monitor your symptoms for a total of 14 days. You do not need to test.  ? If you end quarantine at Day 7, you may do so only if you are a) asymptomatic, b) have a negative test at Day 5 or later), and c) continue to monitor your symptoms for a total of 14 days. A 14-day quarantine is still the safest option. [place] cannot provide testing to end quarantine early. You can find other testing sites at http://kim-miller.com/.     Additional instructions:     At home:   Prohibit all visitors unless absolutely necessary.   Wash hands frequently for at least 20 seconds with soap and water or alcohol-based sanitizer (soap and water preferred if hands visibly dirty).   Avoid touching your face/mouth/nose/eyes and cough or sneeze into a tissue, NOT your hand.   If you must share a room with other people, wear a facemask when sharing a room with other people.   Clean all high-touch surfaces every day (counters, tabletops, doorknobs, bathroom fixtures like faucets, toilets, phones, bedside tables, lamps and lightswitches). Avoid sharing personal items.   Note: If an entire household has tested positive for Covid, it is not necessary to stay in separate rooms or wear masks.   Once it is safe for you to leave your home:   Do not attend large gatherings with people outside your household.   Wear a mask in public and stay 6 feet away from others.

## 2019-08-15 ENCOUNTER — Telehealth (HOSPITAL_BASED_OUTPATIENT_CLINIC_OR_DEPARTMENT_OTHER): Payer: Self-pay | Admitting: Physician Assistant

## 2019-08-15 DIAGNOSIS — Z20822 Contact with and (suspected) exposure to covid-19: Secondary | ICD-10-CM

## 2019-08-15 NOTE — Telephone Encounter (Signed)
COVID RESULT CALL AND ASSESSMENT:    Called patient to discuss COVID-19 result.    Result: POSITIVE.    First day of symptoms: 08/12/19     Description of symptoms: Pt is feeling well. She has a slight ST and sinus congestion.   No fever.   No cough.   No SOB or DOE.   No edema.     First day of dyspnea (if any, otherwise type N/A):   NA    Worsening dyspnea?  N/A      Is the patient self-isolating?  No  Household members and whether they are sick or have been tested:   Her whole household has Covid.     Medical history: CKD, HTN, obesity    Assessment:    1. Current day of symptoms: 3      Plan:    1. The patient requires ACC evaluation (consider for any new respiratory symptom and worsening symptoms particularly DOI > 4): No  2. Secondary diagnosis (e.g. bacterial PNA) suspected: No  3. Based on my assessment of the patient's risk, I am referring the patient to Community Management: Yes (please describe): High risk      Counseling:    1. Home care advice (can use COVIDHOMECARE)    a. Self isolation:   i. Advised patient she should stay home for 10 days and until she has experienced symptomatic improvement and has been afebrile without antipyretics for 24 hours: Yes  ii. Inquired whether patient can safely isolate at home and given number for [facility] isolation sites if appropriate (anyone can call 7am-7pm) [phone]: NA  iii. Last day of isolation: 3/2  b. Contacts:  i. Identified all close contacts -- all household members and anyone with whom you have spent more than 15 minutes from up to 2 days before you developed symptoms or tested positive:  Yes  ii. All close contacts should be tested:   Yes  iii. All close contacts should quarantine for 14 days from the last date of contact (note: it is possible to quarantine for 10 days, even without retesting, for patients who remain asymptomatic and can continue to monitor their symptoms for 14 days): Yes    2. Food insecurity: Already requested by her daughter    86.    Would you be interested in speaking to someone at [facility] about a clinical trial of a possible therapy for COVID? No    4.   Offered letter for work or school and routed to [employer] [facility]ITY [facility]NA[employer] [place]: No    5.   Advised patient that if she develops new symptoms, she should call COVID Triage Center M-F 8-5 or her clinic at [phone]: Yes    Disposition: Requires Community Management, patient advised and follow up request sent to pool

## 2019-08-16 ENCOUNTER — Encounter (HOSPITAL_BASED_OUTPATIENT_CLINIC_OR_DEPARTMENT_OTHER): Payer: Self-pay | Admitting: "Psychiatric/Mental Health

## 2019-08-16 ENCOUNTER — Ambulatory Visit: Payer: No Typology Code available for payment source | Attending: Psychiatry | Admitting: "Psychiatric/Mental Health

## 2019-08-16 DIAGNOSIS — H8309 Labyrinthitis, unspecified ear: Secondary | ICD-10-CM

## 2019-08-16 MED ORDER — VENLAFAXINE HCL ER 37.5 MG PO CP24
ORAL_CAPSULE | ORAL | 5 refills | Status: DC
Start: 2019-08-16 — End: 2019-08-18

## 2019-08-16 MED FILL — VENLAFAXINE  37.5MG ER: 30 days supply | Qty: 30 | Fill #0

## 2019-08-16 NOTE — Progress Notes (Signed)
ADULT OUTPATIENT PSYCHIATRY (OPD)  PSYCHOPHARM FOLLOW UP    LANGUAGE OF CARE:    [place]      LANGUAGE NEEDS MET:    Telephone Interpreter    CHIEF COMPLAINT:     SUBJECTIVE DATA/INTERVAL HISTORY:   Sara Snyder is assessed in a televisit subsequent to her transfer Evaluation.   She reports she was doing fairly well "until we all got Covid. Me and my husband have stayed in all year and now we have Covid." Her entire family who live in multifamily home have Covid. She is very perplexed as to reason that she and husband have Covid. Discuss that her adult children are out and about at work and other places so probably contracted outside the home then she and husband contracted from them.  She reports some anxiety, depression, frustration, irritability, anger, and worry re:"having Covid.". She denies hopelessness, guilt, racing thoughts, rumination, or SI. Energy, motivation, and appetite are normal. She is not hypomanic or overtly psychotic. She reports some sleep disturbance-using Trazodone 1/2 tablet .     OBJECTIVE DATA:   CURRENT MEDICATIONS:    Current Outpatient Medications   Medication Sig    traZODone (DESYREL) 50 MG tablet 1/2 - 1 tablet HS, PRN    fluticasone (FLONASE) 50 MCG/ACT nasal spray 1 spray by Each Nostril route daily    benzonatate (TESSALON) 100 MG capsule Take 1 capsule by mouth 3 (three) times daily as needed for Cough  for up to 20 days    venlafaxine (EFFEXOR-XR) 37.5 MG 24 hr capsule 1 tablet AM along with 75 mg-total = 112.5 mg    venlafaxine (EFFEXOR-XR) 75 MG 24 hr capsule Take 1 capsule by mouth daily Take along with 37.5 mg. TDD=112.5 mg.    valsartan (DIOVAN) 320 MG tablet Take 1 tablet by mouth daily    clonazePAM (KLONOPIN) 1 MG tablet 1 tablet twice daily as needed    chlorthalidone (HYGROTEN) 50 MG tablet Take 1 tablet by mouth daily    atenolol (TENORMIN) 50 MG tablet One tab in am and one tab in pm    omeprazole (PRILOSEC) 20 MG capsule Take 1 capsule by mouth  daily    meclizine (ANTIVERT) 25 MG TABS Take 1 tablet by mouth every 8 (eight) hours as needed    Multiple Vitamins-Minerals (HAIR SKIN AND NAILS FORMULA) TABS Take 1 capsule by mouth daily    loratadine (CLARITIN) 10 MG tablet Take 1 tablet by mouth daily    calcium-vitamin D (OSCAL-500) 500-400 MG-UNIT per tablet Take 1 tablet by mouth 2 (two) times daily    azelastine (ASTELIN) 0.1 % nasal spray 1 spray by Each Nostril route 2 (two) times daily Use in each nostril as directed    acetaminophen (TYLENOL) 500 MG tablet Take 1 tablet by mouth every 6 (six) hours as needed for Pain     No current facility-administered medications for this visit.           VITAL SIGNS:   There were no vitals filed for this visit.     LABS:    WHITE BLOOD CELL COUNT (TH/uL)   Date Value   04/10/2019 5.8     RED BLOOD CELL COUNT (M/uL)   Date Value   04/10/2019 4.14     HEMOGLOBIN (g/dL)   Date Value   04/10/2019 12.2     No results found for: IDEALHCT  HEMATOCRIT (%)   Date Value   04/10/2019 37.5     No results found  for: HCTDIFF  MEAN CORPUSCULAR VOL (fl)   Date Value   04/10/2019 90.6     MEAN CORPUSCULAR HGB (pg)   Date Value   04/10/2019 29.5     MEAN CORP HGB CONC (g/dL)   Date Value   04/10/2019 32.5     No results found for: RDW  PLATELET COUNT (TH/uL)   Date Value   04/10/2019 259     MEAN PLATELET VOLUME (fL)   Date Value   04/10/2019 10.7     NEUTROPHIL % (%)   Date Value   04/10/2019 56.9     LYMPHOCYTE % (%)   Date Value   04/10/2019 34.9     MONOCYTE % (%)   Date Value   04/10/2019 5.3     EOSINOPHIL % (%)   Date Value   04/10/2019 2.1     BASOPHIL % (%)   Date Value   04/10/2019 0.5           ALBUMIN (g/dL)   Date Value   04/10/2019 3.8     ALKALINE PHOSPHATASE (U/L)   Date Value   04/10/2019 117     ALANINE AMINOTRANSFERASE (U/L)   Date Value   04/10/2019 30     ASPARTATE AMINOTRANSFERASE (U/L)   Date Value   04/10/2019 21     Glucose Random (mg/dL)   Date Value   07/20/2019 99     BUN (UREA NITROGEN) (mg/dL)    Date Value   07/20/2019 17     CALCIUM (mg/dL)   Date Value   07/20/2019 9.0     CHLORIDE (mmol/L)   Date Value   07/20/2019 104     CARBON DIOXIDE (mmol/L)   Date Value   07/20/2019 30     CREATININE (mg/dL)   Date Value   07/20/2019 1.3 (H)     POTASSIUM (mmol/L)   Date Value   07/20/2019 3.9     SODIUM (mmol/L)   Date Value   07/20/2019 140     BILIRUBIN TOTAL (mg/dL)   Date Value   04/10/2019 0.3     TOTAL PROTEIN (g/dL)   Date Value   04/10/2019 7.4         THYROID SCREEN TSH REFLEX FT4   Date Value Ref Range Status   07/20/2019 4.710 (H) 0.358 - 3.740 uIU/mL Final     Comment:     **As per the Thyroid Screen protocol, an FT4 test is  automatically ordered for all patients with abnormal TSH  results unless the FT4 has already been performed on this  patient within the last 168 hours.        Cholesterol (mg/dL)   Date Value   04/10/2019 223     LOW DENSITY LIPOPROTEIN DIRECT (mg/dL)   Date Value   04/10/2019 159     HIGH DENSITY LIPOPROTEIN (mg/dL)   Date Value   04/10/2019 43     TRIGLYCERIDES (mg/dL)   Date Value   04/10/2019 169 (H)       HEMOGLOBIN A1C (%)   Date Value   07/20/2019 5.9 (H)     No results found for: POCA1C    RPR QUAL   Date Value Ref Range Status   07/20/2019 Non Reactive Non Reactive Final     Comment:     Performed at:  [facility]  [address], [place], [place]  [id number]  Lab Director: Allyn Kenner MD, Phone:  [phone]  No results found for: HIVAB  No results found for: VAL  No results found for: CLOZ     Pregnancy/Birth Control (for female patients): N/A    CURRENT TREATMENT/CONTACT INFO FOR OTHER AGENCIES AND [place] (if applicable):    Contact/Community Support    No documentation.         COLUMBIARISK ASSESSMENTS:   Suicide:  C-SSRS OP Last Contact Screener - 08/16/19 1542        C-SSRS OP Last Contact Screener    Have you wished you were dead or wished you could go to sleep and not wake up?  No     Have you actually had any thoughts of killing  yourself?   No     Have you done anything, started to do anything, or prepared to do anything to end your life?  No     C-SSRS OP Last Contact Screener Risk Score  No Risk         C-SSRS Risk Assessment    No documentation.         VIOLENCE:   Violence/Abuse Risk    No documentation.          MENTAL STATUS EXAMINATION:  Pleasant; speech-nl rate/tone; attent/conc-fair; mood-"anxious"; depression-yes; anxiety-yes; panic-yes; irritability/impatience-yes; anger-yes; disappointment-yes; anhedonia-some; frustration-yes; sleep-to bed at 1-3AM-initial insomnia-some, wakings-for BR, EMA-no, difficulty w/ OOB and facing the day-up at 10-11AM, sometimes til 1PM; appetite-nl; energy-fair motivation-fair; isol/withdr-no; fatigue-naps; hopelessness-no; guilt-no;no-SI/HI. Coherent but often tangential and needing constant refocus;  thought process/associations-fairly organized; thought content-no overt psychosis; fund of knowledge-grossly intact; memory-grossly intact; racing thoughts-no; rumination-no; worry-some; no-AH,VH,CH,OH,TH;paranoia-no;delusions-no; IOR-no; thought broadcasting/insertion/removal-no; A+Ox3. I/J-nl      RISK ASSESSMENTS:     Violence: low (1)     Addiction: low (1)     BIO/PSYCHO/SOCIAL AND RISK FORMULATION(S): 64 y/o [place] female with longstanding MDD, recurrent, GAD, and Panic D/O now in need of psychopharm follow up,and counseling though she has dropped out of same in the past.    DIAGNOSES:    MDD, recurrent, in partial remission; GAD; Panic D/O    PLAN:Continue meds as ordered. Next appt-4/7  Effexor XR 112.5 mg QD - 75 mg + 37.42m-#30-5 refills  Reduce to Trazodone 50 mg -1/2 tablet HS - not taking so will reduce dose.   Klonopin 1 mg twice daily, PRN  #60- 2 refills     INFORMED CONSENT - for any new medication:   N/A     CARE PLAN/ EPISODES:  Linked Episodes   Type: Episode: Status: Noted: Resolved: Last update: Updated by:   BCantonActive 08/16/2019  08/16/2019   3:39 PM LFelix Pacini RNCS      Comments:       COUNSELING AND COORDINATION OF CARE PROVIDED:   I spent a total of 40 minutes on this visit on the date of service (total time includes all activities performed on the date of service)      [place] RNCS

## 2019-08-17 ENCOUNTER — Other Ambulatory Visit: Payer: Self-pay

## 2019-08-17 ENCOUNTER — Telehealth (HOSPITAL_BASED_OUTPATIENT_CLINIC_OR_DEPARTMENT_OTHER): Payer: Self-pay | Admitting: Internal Medicine

## 2019-08-17 ENCOUNTER — Ambulatory Visit: Payer: No Typology Code available for payment source | Attending: Family Medicine | Admitting: Family Medicine

## 2019-08-17 DIAGNOSIS — U071 COVID-19: Secondary | ICD-10-CM

## 2019-08-17 NOTE — Telephone Encounter (Signed)
Patient's language of care? portuguese [place]  NamPatient's language of care? portuguese [place]  Name of caller: Jed Limerick   Relationship to patient: Daughter   Contact phone number? [phone]  Reason for call? the daughter is calling because everyone in household tested positive on 08/14/2019 and her mother and father cough sounds really bad, the daughter is worried and wonders if there is any medication the nurse can prescribe.  Has the patient called or spoken to someone about this issue before? No.

## 2019-08-17 NOTE — Telephone Encounter (Signed)
Called daughter back, v/m said phone is not accepting call.  Called pt home with interpreter, pt answered and call was dropped.  Called again with interpreter, spoke with daughter  COVID TRIAGE CENTER - FOLLOW UP:    I have confirmed the patient's name and DOB. Yes , wanted me to speak with daughter  All members of the household tested positive on 08/14/19  Have been quarantine   Emergency care:    1. Is the patient patient gasping for air or unable to speak? No  2. Does the patient have new severe chest pain? No  3. Is the patient lethargic or does the patient have altered mental status?  No    A) Reason for call (document all new or changing symptoms):    First day of symptoms: 08/12/19   Current day of symptoms: 5    First day of dyspnea (if any, otherwise type N/A):       Describe all other new or worsening symptoms: coughing a lot, unable to sleep, sorethroat, nasal congestion.  No fever, chills sometimes  No sob, no chest pain  Aware when to call 911  Taking tylenol   Eating and drinking fine  Wanted rx of cough med to be sent to pharmacy  Offered appt at [facility], preferred televisit.  appt scheduled  Advised to make pt breathe steam from shower    Patient enrolled in Community Management? Yes    B) Need for in-person evaluation:    1. Is the patient experiencing respiratory symptoms (dypsnea, orthopnea, dyspnea on exertion, wheezing), worsening respiratory symptoms from baseline, or worsening of a chronic cough? Yes  2. Has the patient had a fever for > 48 hours? No  3. Is the patient at DOI ? 4 with worsening symptoms? No  4. Does the patient have chest pain (note: new severe chest pain must be referred to ED)? No  5. Is the patient dizzy or lightheaded? No  6. Does the patient have severe sore throat? No  7. Is the patient pregnant? No    Disposition:    1. I have provided the patient with Home Care Advice (use HOMECAREADVICE). Yes   2. I have given the patient directions to Acute Care Clinic (ACCDIRECTIONS), if  appropriate. N/A  3. If the patient has an in-person appointment scheduled in the next 14 days (other than [facility]), I have instructed the patient that she should not attend, and have sent a message to the pertinent front desk pool:  Yes, appt with PCP cancelled next week, daughter will reschedule after quarantine  COVID HOME CARE ADVICE:    The following instructions are intended for patients who have been have Covid, have a Covid test pending, or who may have Covid but cannot be tested.    Treating symptoms and anticipatory guidance:     There are no specific therapies for Covid (coronavirus) in the outpatient setting.   Continue to take your prescribed medicines.   Do not take other medicines unless they have been prescribed.   Rest and take plenty of fluids.   For symptomatic relief, use acetaminophen or ibuprofen, as long as you have not been told you cannot take these medicines.   Patients with worsening symptoms should:  ? Call [phone] M-Sa 8-5 or their home clinic phone number.  ? Call 911 in an emergency.    Self-isolation and self-quarantine precautions:    The following advice applies to all patients who have been tested for Covid while their  result is pending, and to all patients who test positive for Covid.     Self-isolation directions for patients:  ? You should not leave home -- no work, school, public places, stores, public transportation. No exceptions except emergencies.  ? If possible, stay in one room of the house by yourself, ideally with a window open. Use a separate bathroom if available. Household members should sleep in a separate room.  ? Duration of self-isolation: AT LEAST 10 days since first day of symptoms AND at least 24 hours since resolution of fever without antipyretics AND improvement of other symptoms. If you were hospitalized for COVID, we recommend that you stay home for at least 20 days from your first day of symptoms (this time includes the time you were in the  hospital).   Self-quarantine directions for household members and close contacts:  ? Patients should notify all close contacts that they have symptoms of Covid. Close contacts include anyone with whom the patient spent significant time (> 15 minutes in a 24-hour period, less than 6 feet apart) since becoming symptomatic. This includes co-workers in [employer], etc. Close contacts must also self-quarantine.  ? All close contacts should be tested. Even if they test negative, they must continue to quarantine. If at any time they develop symptoms, they should be retested.  ? All household members should stay home for 14 days from the patient's first date of symptoms (or first day of positive test, if asymptomatic) -- no work, school, public places, stores, public transportation. Note: If contacts are unable to isolate from the Covid patient, they must quarantine for 14 days from the last day that the patient with Covid is contagious (i.e.: quarantine for 10 days while the patient is contagious, and then 14 more days).  ? If you must end quarantine at Day 10, you may do so if you are a) asymptomatic and b) continue to monitor your symptoms for a total of 14 days. You do not need to test.  ? If you end quarantine at Day 7, you may do so only if you are a) asymptomatic, b) have a negative test at Day 5 or later), and c) continue to monitor your symptoms for a total of 14 days. A 14-day quarantine is still the safest option.  cannot provide testing to end quarantine early. You can find other testing sites at http://kim-miller.com/.     Additional instructions:     At home:   Prohibit all visitors unless absolutely necessary.   Wash hands frequently for at least 20 seconds with soap and water or alcohol-based sanitizer (soap and water preferred if hands visibly dirty).   Avoid touching your face/mouth/nose/eyes and cough or sneeze into a tissue, NOT your hand.   If you must share a room with other people,  wear a facemask when sharing a room with other people.   Clean all high-touch surfaces every day (counters, tabletops, doorknobs, bathroom fixtures like faucets, toilets, phones, bedside tables, lamps and lightswitches). Avoid sharing personal items.   Note: If an entire household has tested positive for Covid, it is not necessary to stay in separate rooms or wear masks.   Once it is safe for you to leave your home:   Do not attend large gatherings with people outside your household.   Wear a mask in public and stay 6 feet away from others.    Final disposition: Patient referred to [facility], appointment scheduled at: 12:30 PM, preferred televisit

## 2019-08-17 NOTE — Progress Notes (Signed)
CC: COVID+     Sara Snyder is a 64 year old female presenting for     First day of symptoms 08/12/2019    Started with mild cough  + chills, but no fever  No GI symptoms  No SOB  + nasal congestion  No loss of sense of taste and smell  No issues with eating  Mild myalgias  Occasional headaches  + sore throat, LN on the left side of the neck is swollen.  Today: worsening of the cough and runny nose.  Has been taking Claritin, Tylenol.  Has tried Robitussin without good results.  No h/o asthma.     ROS     Patient Active Problem List:     Essential hypertension     Labyrinthitis     Panic disorder     Hyperlipidemia     Chronic cough     Mild episode of recurrent major depressive disorder (HCC)     Allergic rhinitis     Need for prophylactic vaccination and inoculation against influenza     Left arm pain     Stage 3a chronic kidney disease     Decreased GFR     Anxiety state     Palpitations     Psychophysiological insomnia     COVID-19       No family history on file.           venlafaxine (EFFEXOR-XR) 37.5 MG 24 hr capsule, 1 tablet AM along with 75 mg-total = 112.5 mg, Disp: 30 capsule, Rfl: 5      omeprazole (PRILOSEC) 20 MG capsule, Take 1 capsule by mouth daily, Disp: 30 capsule, Rfl: 5      valsartan (DIOVAN) 320 MG tablet, Take 1 tablet by mouth daily, Disp: 30 tablet, Rfl: 11      traZODone (DESYREL) 50 MG tablet, 1 tablet HS, Disp: 30 tablet, Rfl: 2      clonazePAM (KLONOPIN) 1 MG tablet, 1 tablet twice daily as needed, Disp: 60 tablet, Rfl: 2      meclizine (ANTIVERT) 25 MG TABS, Take 1 tablet by mouth every 8 (eight) hours as needed, Disp: 90 tablet, Rfl: 2      venlafaxine (EFFEXOR-XR) 75 MG 24 hr capsule, Take 1 capsule by mouth daily Take along with 37.5 mg. TDD=112.5 mg., Disp: 30 capsule, Rfl: 5      chlorthalidone (HYGROTEN) 50 MG tablet, Take 1 tablet by mouth daily, Disp: 90 tablet, Rfl: 3      atenolol (TENORMIN) 50 MG tablet, One tab in am and one tab in pm, Disp: 60 tablet, Rfl: 6       Multiple Vitamins-Minerals (HAIR SKIN AND NAILS FORMULA) TABS, Take 1 capsule by mouth daily, Disp: 30 tablet, Rfl: 12      loratadine (CLARITIN) 10 MG tablet, Take 1 tablet by mouth daily, Disp: 30 tablet, Rfl: 11      calcium-vitamin D (OSCAL-500) 500-400 MG-UNIT per tablet, Take 1 tablet by mouth 2 (two) times daily, Disp: 60 tablet, Rfl: 11      azelastine (ASTELIN) 0.1 % nasal spray, 1 spray by Each Nostril route 2 (two) times daily Use in each nostril as directed, Disp: 30 mL, Rfl: 4      acetaminophen (TYLENOL) 500 MG tablet, Take 1 tablet by mouth every 6 (six) hours as needed for Pain, Disp: 30 tablet, Rfl: 6      fluticasone (FLONASE) 50 MCG/ACT nasal spray, 1 spray by Each Nostril route daily, Disp:  1 Bottle, Rfl: 5    No current facility-administered medications on file prior to visit.        Review of Patient's Allergies indicates:   Pollen extract          Cough     Social History    Tobacco Use      Smoking status: Former Smoker        Packs/day: 2.00        Years: 25.00        Pack years: 7        Quit date: 10/27/1977        Years since quitting: 41.8      Smokeless tobacco: Never Used    Alcohol use: Not Currently    Drug use: Never         Exam - vitals deferred in setting of COVID pandemic  Speaking in complete sentences, breathing comfortably       Assessment/Plan:      1. COVID-19    Day 6.  Appears stable, advised to come for in person exam, app was made at [facility] on 2/26.

## 2019-08-18 ENCOUNTER — Ambulatory Visit: Payer: No Typology Code available for payment source | Attending: Family Medicine | Admitting: Family Medicine

## 2019-08-18 VITALS — BP 150/85 | HR 99 | Temp 99.0°F | Wt 186.7 lb

## 2019-08-18 DIAGNOSIS — R05 Cough: Secondary | ICD-10-CM | POA: Insufficient documentation

## 2019-08-18 DIAGNOSIS — U071 COVID-19: Secondary | ICD-10-CM | POA: Diagnosis not present

## 2019-08-18 DIAGNOSIS — F330 Major depressive disorder, recurrent, mild: Secondary | ICD-10-CM | POA: Insufficient documentation

## 2019-08-18 DIAGNOSIS — I10 Essential (primary) hypertension: Secondary | ICD-10-CM

## 2019-08-18 DIAGNOSIS — R058 Other specified cough: Secondary | ICD-10-CM

## 2019-08-18 MED ORDER — BENZONATATE 100 MG PO CAPS
100.0000 mg | ORAL_CAPSULE | Freq: Three times a day (TID) | ORAL | 0 refills | Status: DC | PRN
Start: 2019-08-18 — End: 2019-08-18

## 2019-08-18 MED ORDER — FLUTICASONE PROPIONATE 50 MCG/ACT NA SUSP
1.0000 | Freq: Every day | NASAL | 5 refills | Status: DC
Start: 2019-08-18 — End: 2019-08-18

## 2019-08-18 MED ORDER — VENLAFAXINE HCL ER 75 MG PO CP24
75.0000 mg | ORAL_CAPSULE | Freq: Every day | ORAL | 5 refills | Status: DC
Start: 2019-08-18 — End: 2020-01-03

## 2019-08-18 MED ORDER — CLONAZEPAM 1 MG PO TABS
ORAL_TABLET | ORAL | 2 refills | Status: DC
Start: 2019-08-18 — End: 2019-11-22

## 2019-08-18 MED ORDER — VALSARTAN 320 MG PO TABS
320.0000 mg | ORAL_TABLET | Freq: Every day | ORAL | 11 refills | Status: DC
Start: 2019-08-18 — End: 2020-06-13

## 2019-08-18 MED ORDER — ATENOLOL 50 MG PO TABS
ORAL_TABLET | ORAL | 6 refills | Status: DC
Start: 2019-08-18 — End: 2020-02-20

## 2019-08-18 MED ORDER — VENLAFAXINE HCL ER 37.5 MG PO CP24
ORAL_CAPSULE | ORAL | 5 refills | Status: DC
Start: 2019-08-18 — End: 2020-01-03

## 2019-08-18 MED ORDER — FLUTICASONE PROPIONATE 50 MCG/ACT NA SUSP
1.00 | Freq: Every day | NASAL | 5 refills | Status: AC
Start: 2019-08-18 — End: 2020-08-18

## 2019-08-18 MED ORDER — CHLORTHALIDONE 50 MG PO TABS
50.0000 mg | ORAL_TABLET | Freq: Every day | ORAL | 3 refills | Status: DC
Start: 2019-08-18 — End: 2020-06-13

## 2019-08-18 MED ORDER — BENZONATATE 100 MG PO CAPS
100.00 mg | ORAL_CAPSULE | Freq: Three times a day (TID) | ORAL | 0 refills | Status: AC | PRN
Start: 2019-08-18 — End: 2019-09-07

## 2019-08-18 MED FILL — BENZONATATE 100MG: 10 days supply | Qty: 30 | Fill #0

## 2019-08-18 MED FILL — FLUTICASONE SPR 50MCG: 30 days supply | Qty: 16 | Fill #0

## 2019-08-18 MED FILL — ATENOLOL 50MG: 30 days supply | Qty: 60 | Fill #0

## 2019-08-18 MED FILL — CLONAZEPAM 1MG: 30 days supply | Qty: 60 | Fill #0

## 2019-08-18 MED FILL — *VALSARTAN 320MG: 30 days supply | Qty: 30 | Fill #0

## 2019-08-18 NOTE — Progress Notes (Signed)
Acute Care Clinic Note    Subjective  Sara Snyder 64 year old [place] ([place]speaking female who presents for evaluation in acute care clinic and is known COVID-19 positive.    HPI  Date of symptom onset: 08/11/18  Date of dyspnea onset:      Started with mild cough  + chills, but no fever  No GI symptoms  No SOB  + nasal congestion  No loss of sense of taste and smell  No issues with eating  Mild myalgias  Occasional headaches  + sore throat, LN on the left side of the neck is swollen- better today  Today: worsening of the cough and runny nose.  Has been taking Claritin, Tylenol.  Has tried Robitussin without good results.  No h/o asthma.    Pertinent occupation, housing, or social comments: Lives with husband and daughter and her husband and son-5 yo.  Daughter and her husband are working outside the house.  Everybody is positive for COVID.    ROS  No abdominal pain, nausea, vomiting, diarrhea. No lower extremity swelling. No new rashes or skin changes.     Medical risks for COVID complications:  obesity    Per chart review prior to visit:  Social History    Tobacco Use      Smoking status: Former Smoker        Packs/day: 2.00        Years: 25.00        Pack years: 48        Quit date: 10/27/1977        Years since quitting: 41.8      Smokeless tobacco: Never Used    Review of Patient's Allergies indicates:   Pollen extract          Cough    Objective  BP (!) 168/85    Pulse 99    Temp 99 F (37.2 C)    Wt 84.7 kg (186 lb 11.7 oz)    SpO2 96%    BMI 37.71 kg/m        Most Recent O2 Sat Reading(s)  08/18/19 : 96%  07/20/19 : 98%  04/10/19 : 97%    Gen: no respiratory distress  HEENT:  Nares: patent  CV: RRR  Pulm: no rhonchi, no wheezing, no bibasilar rales  Skin: good skin turgor, not diaphoretic  Neuro: alert and oriented to time, person and place  Psych: normal affect. Normal thought content, speech, mood are noted.    Assessment  Date of symptom onset: 08/11/18  Day of Illness: 7  Date of dyspnea  onset:  N/A  Covid Risk Category:  moderate      Clinical assessment: COVID    Plan  - Problem List Updated  - Symptomatic treatment discussed   - Education provided on proning  - Precautions given, including to call immediately or if new or worsening shortness of breath, and when to seek emergency care or call 911.  - Challenges to quarantine discussed: yes        Disposition  Dispo to home: Home isolation instructions and symptom precaution printed in AVS and reviewed with patient.   Follow up in 2 day(s) via televisit; chart routed to appropriate team/pool.     I spent a total of 30 minutes on this visit on the date of service (total time includes all activities performed on the date of service)        Basil Dess, MD    CC to PCP:  Shaune Pascal, MD

## 2019-08-20 ENCOUNTER — Encounter (HOSPITAL_BASED_OUTPATIENT_CLINIC_OR_DEPARTMENT_OTHER): Payer: Self-pay | Admitting: Physician Assistant

## 2019-08-20 ENCOUNTER — Ambulatory Visit: Payer: No Typology Code available for payment source | Attending: Internal Medicine | Admitting: Physician Assistant

## 2019-08-20 DIAGNOSIS — U071 COVID-19: Secondary | ICD-10-CM | POA: Insufficient documentation

## 2019-08-20 NOTE — Progress Notes (Signed)
Portuguese interppreter used    COVID MANAGEMENT TELEVISIT -- FOLLOW UP:    Current risk category:      Symptoms:    Sore throat persists, +nasal burning sensation in nose  Taking ibuprofen with some effect but sensation then comes back and sometimes   No sob/doe but a little tired if doing activtities    2 days ago had headaches- passed w/ ibuprofen  No fevers now- highest 37.4 C      History:    History reviewed, no significant updates.    Has the patient been self-isolating? Yes, whole family is positive    Evaluation of respiratory symptoms:    No new respiratory symptoms.    O2 96-97% walking and resting  71-84 BPM HR    ASSESSMENT AND PLAN:    Suspected COVID infection    Assessment:    1. Current risk category:   high  2. First day of symptoms: 08/11/18   3. Current day of symptoms: 9    Plan:    Stable, disposition detailed below.    Counseling:    D/c ibuprofen d/t ckd    1. Home care advice reviewed:  Yes  2.   Advised patient that if she develops new symptoms, she should call clinic at [phone]: Yes    Follow up:    Risk category at end of call (if adjusting risk level, update in Plan activity): Moderate    1. Problem list updated? Yes  2.   Follow up cc'd to appropriate pool? Yes    Disposition: Continue Community Management, next visit: 3/2-3/2    I spent 14 minutes on the phone with this patient/proxy of which > 50% was in counseling and coordination of care.    Makayleigh Poliquin Erin Sons, PA-C, 08/20/2019

## 2019-08-21 ENCOUNTER — Ambulatory Visit (HOSPITAL_BASED_OUTPATIENT_CLINIC_OR_DEPARTMENT_OTHER): Payer: Self-pay | Admitting: Internal Medicine

## 2019-08-22 ENCOUNTER — Ambulatory Visit: Payer: No Typology Code available for payment source | Attending: Internal Medicine | Admitting: Internal Medicine

## 2019-08-22 DIAGNOSIS — U071 COVID-19: Secondary | ICD-10-CM | POA: Diagnosis present

## 2019-08-22 MED ORDER — TRAZODONE HCL 50 MG PO TABS
ORAL_TABLET | ORAL | 0 refills | Status: DC
Start: 2019-08-22 — End: 2019-11-21

## 2019-08-22 NOTE — Progress Notes (Signed)
COVID MANAGEMENT TELEVISIT -- FOLLOW UP:    Interval history:    Feels totally well without symptoms now. No cough no SOB and energy levels back to baseline.     Symptoms:    First day of symptoms: 08/11/18   Current day of symptoms: 11    First day of dyspnea::    na    History:    Has the patient been self-isolating? yes    Updates to medical or social history: staying with daughter whos whole family has covid.     Objective evaluation of respiratory symptoms:    Vitals deferred in the setting of COVID pandemic.    Well sounding without dyspnea.     Results:    If COVID testing ordered, reviewed: Positive    Advanced Care Planning & Goals of Care Convesations: Goals of care not discussed at this visit    ASSESSMENT AND PLAN:    Suspected COVID infection    Assessment:    1. Current risk category:     2. First day of symptoms: 08/11/18   3. Current day of symptoms: 11  4. Roth score, if performed:  Na    Plan:    Will dc    1. Problem list updated? Yes  2.   Follow up cc'd to appropriate pool? Yes    Disposition: Discharge from [facility], chart routed to PCP    I spent 14 minutes on the phone with this patient/proxy of which > 50% was in counseling and coordination of care.

## 2019-09-05 ENCOUNTER — Other Ambulatory Visit: Payer: Self-pay

## 2019-09-06 ENCOUNTER — Ambulatory Visit: Payer: 344 | Attending: Internal Medicine

## 2019-09-06 DIAGNOSIS — Z23 Encounter for immunization: Secondary | ICD-10-CM | POA: Diagnosis not present

## 2019-09-18 ENCOUNTER — Ambulatory Visit (HOSPITAL_BASED_OUTPATIENT_CLINIC_OR_DEPARTMENT_OTHER): Payer: No Typology Code available for payment source

## 2019-09-19 ENCOUNTER — Ambulatory Visit: Payer: No Typology Code available for payment source | Attending: Internal Medicine

## 2019-09-19 DIAGNOSIS — R7303 Prediabetes: Secondary | ICD-10-CM | POA: Diagnosis not present

## 2019-09-19 DIAGNOSIS — E781 Pure hyperglyceridemia: Secondary | ICD-10-CM | POA: Insufficient documentation

## 2019-09-19 NOTE — Progress Notes (Signed)
INITIAL NUTRITION ASSESSMENT / INTERVENTION    Total Minutes: >60    Sara Snyder is a 64 year old female who presents with prediabetes and hypertiglyceridemia.  Visit via [place] interpreter, via phone.  The patient is from [place]. Ethnicity: [place].  Patient's language is [place]. Patient able to read [place]. Patient has been in [place]. for two years. Patient lives with husband and adult daughter, grandchild.     Subjective (including lifestyle changes): Patient reports: having pain due to weight. Feeling a lot of fatigue and has difficulty doing any exercise after suffering covid. Sense of smell is still not back to normal.    Notes she has Hypertension, anxiety and depression.    Notes a higher carb diet in general    Diet Recall:  B - coffee with milk w/small spoon sugar  L - rice + beans, some meat but more often fried egg  D -  rice + beans, some meat but more often fried egg  Other times noodles/potato are included too for the kids    Tends to eat more cake this time of year because all their birthdays are the same time    Veggies: has veggies with dinner    Doesn't eat cakes, doesn't eat bread   Her daughters don't let her have fruit juice  Sometimes has yakult or danone     Fluids:  Water   Coffee 2x per day  No fruit juice    Notes poor sleep quality      Patient expressed at this time she is most interested in weight loss medication than lifestyle change. Does not feel capable of working on physical activity right now.    She feels like no one would care if she died. Does not feel heard by her providers. Is very afraid she is losing memory and something is wrong with her head.       Patient has had HTN for 1-5 years and has not had Medical Nutrition Therapy before today.    Current Outpatient Medications   Medication Sig    traZODone (DESYREL) 50 MG tablet 1/2 - 1 tablet HS, PRN    fluticasone (FLONASE) 50 MCG/ACT nasal spray 1 spray by Each Nostril route daily    venlafaxine (EFFEXOR-XR)  37.5 MG 24 hr capsule 1 tablet AM along with 75 mg-total = 112.5 mg    venlafaxine (EFFEXOR-XR) 75 MG 24 hr capsule Take 1 capsule by mouth daily Take along with 37.5 mg. TDD=112.5 mg.    valsartan (DIOVAN) 320 MG tablet Take 1 tablet by mouth daily    clonazePAM (KLONOPIN) 1 MG tablet 1 tablet twice daily as needed    chlorthalidone (HYGROTEN) 50 MG tablet Take 1 tablet by mouth daily    atenolol (TENORMIN) 50 MG tablet One tab in am and one tab in pm    omeprazole (PRILOSEC) 20 MG capsule Take 1 capsule by mouth daily    Multiple Vitamins-Minerals (HAIR SKIN AND NAILS FORMULA) TABS Take 1 capsule by mouth daily     No current facility-administered medications for this visit.       Social History    Tobacco Use      Smoking status: Former Smoker        Packs/day: 2.00        Years: 25.00        Pack years: 71        Quit date: 10/27/1977        Years since quitting: 104.9  Smokeless tobacco: Never Used    Alcohol use: Not Currently      Review of Patient's Allergies indicates:   Pollen extract          Cough          Vitals:  Most Recent BP Reading(s)  08/18/19 : (!) 150/85            Most Recent Height Reading(s)  07/20/19 : 4\' 11"  (1.499 m)          Most Recent Weight Reading(s)  08/18/19 : 84.7 kg (186 lb 11.7 oz)    Most Recent Weight Reading(s)  08/18/19 : 84.7 kg (186 lb 11.7 oz)  07/20/19 : 85.3 kg (188 lb)  04/10/19 : 85.7 kg (189 lb)  08/22/18 : 83.5 kg (184 lb)  06/29/18 : 81.2 kg (179 lb)      Estimated body mass index is 37.71 kg/m as calculated from the following:    Height as of 07/20/19: 4\' 11"  (1.499 m).    Weight as of 08/18/19: 84.7 kg (186 lb 11.7 oz).  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       Portion size is excessive.  Eating habits are culturally based    Estimated intake shows: sub-optimal nutrients: monosaturated fats, vitamin C and fiber  excessive nutrients: calories, carbohydrates, fat and added sugar    Assessment/Plan/Conclusion:  Patient presents with altered nutrition-related laboratory values related to excess intake of refined carbohydrates as evidenced by diet recall and A1C of 5.9%.    Provided empathetic listening today as patient's primary concerns were regarding other medical issues today. Acknowledged difficulty with weight loss when dealing with the physical barriers she faces and being in [place] unfamiliar [place]. Discussed mental health support that is available at our healthcare organization and made referral to mental health care partner.     In terms of diet, we had some time to initiate discussion about healthy eating changes to start working on (very basic due to time constraints). We will continue education at follow-ups.     Barriers to dietary changes include: family not in agreement    Client's goals:   Increase vegetables for now   Focus on fruits high in antioxidants    Material provided:to provide at next visit: carbohydrate content of foods    Future educational needs: resume her questions about the best fruits to have; review all non-starchy vegetable options    Referred to: [facility] did not realize that she has psych appt next week. She requested that they use [phone] for the appt - forwarded this request to psych     Referred to [facility] for support this week    Follow-up: 1 months.    [phone], RD, LDN

## 2019-09-20 ENCOUNTER — Telehealth (HOSPITAL_BASED_OUTPATIENT_CLINIC_OR_DEPARTMENT_OTHER): Payer: Self-pay

## 2019-09-20 NOTE — Telephone Encounter (Signed)
On behalf of Elesa Massed (nutritionist), Mental Health Care Partner called patient to follow up about depression. Patient was crying during her televisit.    Patient was reached.    Assessment:  - Patient has long history of depression and anxiety.     - Currently very concerned about pandemic. A few months ago, herself, her husband, children and grandchildren had COVID-19. The total (9 members) of her family had the virus. It affected her a lot. She is afraid of dying and this virus made her face this possibility.    - Recently lost her son-law in [place]. Grieving his loss. Concerned with her daughter and grandchildren that are suffering this loss.     - Reports been crying every day. Not sleeping well but this is not new for her. Patient uses a lot of cell phone at night in bed. She likes to play games on her cell phone.   - Usually goes to bed between 12 am - 1 am and wakes up several times during the night. Besides her difficulties to stay asleep, says that also drinks a lot of water and it contributes for her waking up to use the restroom every night.      - Reports taking medication venlafaxine (Effexor) but believes dosage not been enough for her.   - Reminded follow up with psychiatrist on 09/27/19. Patient agreed.    - She was seen psychotherapist but missed several appointments. Would like to reconnect with therapist.    - Had first dose of COVID vaccine. Going to second dose on 09/27/19.     Intervention:  - Empathic listening  - Emotional support  - Validated concerns about pandemic. Suggested to decrease news exposure  - Reviewed sleep hygiene.   - Discussed physical activities. Suggested walking around her house. Says very afraid of virus and not going outside.   - Reminded psychiatrist appointment  - Encouraged to engage at the "[employer] [facility] [employer] [facility] [employer]" to have more social interaction. Patient declined for now. She wants to feel emotionally strong first in order to interact with other  people.     Plan:  - Follow with psychiatrist about her medication next week  - Call therapist to schedule psychotherapy.  - Call me as needed.

## 2019-09-27 ENCOUNTER — Ambulatory Visit: Payer: 344 | Attending: Internal Medicine

## 2019-09-27 ENCOUNTER — Ambulatory Visit: Payer: No Typology Code available for payment source | Attending: Psychiatry | Admitting: "Psychiatric/Mental Health

## 2019-09-27 ENCOUNTER — Encounter (HOSPITAL_BASED_OUTPATIENT_CLINIC_OR_DEPARTMENT_OTHER): Payer: Self-pay | Admitting: "Psychiatric/Mental Health

## 2019-09-27 ENCOUNTER — Other Ambulatory Visit: Payer: Self-pay

## 2019-09-27 DIAGNOSIS — Z23 Encounter for immunization: Secondary | ICD-10-CM | POA: Insufficient documentation

## 2019-09-27 DIAGNOSIS — F3341 Major depressive disorder, recurrent, in partial remission: Secondary | ICD-10-CM

## 2019-09-27 NOTE — Progress Notes (Signed)
ADULT OUTPATIENT PSYCHIATRY (OPD)  PSYCHOPHARM FOLLOW UP    LANGUAGE OF CARE:    [place]      LANGUAGE NEEDS MET:    Telephone Interpreter    CHIEF COMPLAINT:     SUBJECTIVE DATA/INTERVAL HISTORY:   Sara Snyder is assessed in a televisit.   She reports she's about to get her 2nd vaccine today.  She reports some anxiety, depression, irritability, anger, and worry re:"having Covid and now the vaccine.". She denies hopelessness, guilt, racing thoughts, rumination, or SI. Energy, motivation, and appetit, sleep are fair-normal. She is not hypomanic or overtly psychotic.Marland Kitchen     OBJECTIVE DATA:   CURRENT MEDICATIONS:    Current Outpatient Medications   Medication Sig    traZODone (DESYREL) 50 MG tablet 1/2 - 1 tablet HS, PRN    fluticasone (FLONASE) 50 MCG/ACT nasal spray 1 spray by Each Nostril route daily    benzonatate (TESSALON) 100 MG capsule Take 1 capsule by mouth 3 (three) times daily as needed for Cough  for up to 20 days    venlafaxine (EFFEXOR-XR) 37.5 MG 24 hr capsule 1 tablet AM along with 75 mg-total = 112.5 mg    venlafaxine (EFFEXOR-XR) 75 MG 24 hr capsule Take 1 capsule by mouth daily Take along with 37.5 mg. TDD=112.5 mg.    valsartan (DIOVAN) 320 MG tablet Take 1 tablet by mouth daily    clonazePAM (KLONOPIN) 1 MG tablet 1 tablet twice daily as needed    chlorthalidone (HYGROTEN) 50 MG tablet Take 1 tablet by mouth daily    atenolol (TENORMIN) 50 MG tablet One tab in am and one tab in pm    omeprazole (PRILOSEC) 20 MG capsule Take 1 capsule by mouth daily    meclizine (ANTIVERT) 25 MG TABS Take 1 tablet by mouth every 8 (eight) hours as needed    Multiple Vitamins-Minerals (HAIR SKIN AND NAILS FORMULA) TABS Take 1 capsule by mouth daily    loratadine (CLARITIN) 10 MG tablet Take 1 tablet by mouth daily    calcium-vitamin D (OSCAL-500) 500-400 MG-UNIT per tablet Take 1 tablet by mouth 2 (two) times daily    azelastine (ASTELIN) 0.1 % nasal spray 1 spray by Each Nostril route 2 (two)  times daily Use in each nostril as directed    acetaminophen (TYLENOL) 500 MG tablet Take 1 tablet by mouth every 6 (six) hours as needed for Pain     No current facility-administered medications for this visit.           VITAL SIGNS:   There were no vitals filed for this visit.     LABS:    WHITE BLOOD CELL COUNT (TH/uL)   Date Value   04/10/2019 5.8     RED BLOOD CELL COUNT (M/uL)   Date Value   04/10/2019 4.14     HEMOGLOBIN (g/dL)   Date Value   04/10/2019 12.2     No results found for: IDEALHCT  HEMATOCRIT (%)   Date Value   04/10/2019 37.5     No results found for: HCTDIFF  MEAN CORPUSCULAR VOL (fl)   Date Value   04/10/2019 90.6     MEAN CORPUSCULAR HGB (pg)   Date Value   04/10/2019 29.5     MEAN CORP HGB CONC (g/dL)   Date Value   04/10/2019 32.5     No results found for: RDW  PLATELET COUNT (TH/uL)   Date Value   04/10/2019 259     MEAN PLATELET VOLUME (  fL)   Date Value   04/10/2019 10.7     NEUTROPHIL % (%)   Date Value   04/10/2019 56.9     LYMPHOCYTE % (%)   Date Value   04/10/2019 34.9     MONOCYTE % (%)   Date Value   04/10/2019 5.3     EOSINOPHIL % (%)   Date Value   04/10/2019 2.1     BASOPHIL % (%)   Date Value   04/10/2019 0.5           ALBUMIN (g/dL)   Date Value   04/10/2019 3.8     ALKALINE PHOSPHATASE (U/L)   Date Value   04/10/2019 117     ALANINE AMINOTRANSFERASE (U/L)   Date Value   04/10/2019 30     ASPARTATE AMINOTRANSFERASE (U/L)   Date Value   04/10/2019 21     Glucose Random (mg/dL)   Date Value   07/20/2019 99     BUN (UREA NITROGEN) (mg/dL)   Date Value   07/20/2019 17     CALCIUM (mg/dL)   Date Value   07/20/2019 9.0     CHLORIDE (mmol/L)   Date Value   07/20/2019 104     CARBON DIOXIDE (mmol/L)   Date Value   07/20/2019 30     CREATININE (mg/dL)   Date Value   07/20/2019 1.3 (H)     POTASSIUM (mmol/L)   Date Value   07/20/2019 3.9     SODIUM (mmol/L)   Date Value   07/20/2019 140     BILIRUBIN TOTAL (mg/dL)   Date Value   04/10/2019 0.3     TOTAL PROTEIN (g/dL)   Date Value    04/10/2019 7.4         THYROID SCREEN TSH REFLEX FT4   Date Value Ref Range Status   07/20/2019 4.710 (H) 0.358 - 3.740 uIU/mL Final     Comment:     **As per the Thyroid Screen protocol, an FT4 test is  automatically ordered for all patients with abnormal TSH  results unless the FT4 has already been performed on this  patient within the last 168 hours.        Cholesterol (mg/dL)   Date Value   04/10/2019 223     LOW DENSITY LIPOPROTEIN DIRECT (mg/dL)   Date Value   04/10/2019 159     HIGH DENSITY LIPOPROTEIN (mg/dL)   Date Value   04/10/2019 43     TRIGLYCERIDES (mg/dL)   Date Value   04/10/2019 169 (H)       HEMOGLOBIN A1C (%)   Date Value   07/20/2019 5.9 (H)     No results found for: POCA1C    RPR QUAL   Date Value Ref Range Status   07/20/2019 Non Reactive Non Reactive Final     Comment:     Performed at:  [facility]  [address] [place], [place]  [id number]  Lab Director: Allyn Kenner MD, Phone:  [phone]       No results found for: HIVAB  No results found for: VAL  No results found for: CLOZ     Pregnancy/Birth Control (for female patients): N/A    CURRENT TREATMENT/CONTACT INFO FOR OTHER AGENCIES AND [place] (if applicable):    Contact/Community Support    No documentation.         COLUMBIARISK ASSESSMENTS:   Suicide:  C-SSRS OP Last Contact Screener - 08/16/19 1542  C-SSRS OP Last Contact Screener    Have you wished you were dead or wished you could go to sleep and not wake up?  No     Have you actually had any thoughts of killing yourself?   No     Have you done anything, started to do anything, or prepared to do anything to end your life?  No     C-SSRS OP Last Contact Screener Risk Score  No Risk         C-SSRS Risk Assessment    No documentation.         VIOLENCE:   Violence/Abuse Risk    No documentation.          MENTAL STATUS EXAMINATION:  Pleasant; speech-nl rate/tone; attent/conc-fair; mood-"anxious"; depression-yes; anxiety-yes; panic-yes;  irritability/impatience-yes; anger-yes; anhedonia-no; sleep-to bed at 24M-initial insomnia-some, wakings-for BR, EMA-no, difficulty w/ OOB and facing the day-up at 11AM -!2N; appetite-nl; energy-fair motivation-fair; isol/withdr-no; fatigue-naps; hopelessness-some; guilt-no;no-SI/HI. Coherent but often tangential and needing constant refocus;  thought process/associations-fairly organized; thought content-no overt psychosis; fund of knowledge-grossly intact; memory-grossly intact; racing thoughts-no; rumination-no; worry-"my family"; no-AH,VH,CH,OH,TH;paranoia-no;delusions-no; IOR-no; thought broadcasting/insertion/removal-no; A+Ox3. I/J-nl      RISK ASSESSMENTS:     Violence: low (1)     Addiction: low (1)     BIO/PSYCHO/SOCIAL AND RISK FORMULATION(S): 64 y/o [place] female with longstanding MDD, recurrent, GAD, and Panic D/O now in need of psychopharm follow up,and counseling though she has dropped out of same in the past.    DIAGNOSES:    MDD, recurrent, in partial remission; GAD; Panic D/O    PLAN:Continue meds as ordered. Next appt-5/19   Effexor XR 112.5 mg QD  Trazodone 50 mg -1/2 tablet HS - not taking.   Klonopin 1 mg twice daily, PRN       INFORMED CONSENT - for any new medication:   N/A     CARE PLAN/ EPISODES:  Linked Episodes   Type: Episode: Status: Noted: Resolved: Last update: Updated by:   [place] Active 08/16/2019  08/16/2019  3:39 PM Felix Pacini, RNCS      Comments:       COUNSELING AND COORDINATION OF CARE PROVIDED:   I spent a total of 40 minutes on this visit on the date of service (total time includes all activities performed on the date of service)      [place], RNCS

## 2019-10-19 ENCOUNTER — Ambulatory Visit (HOSPITAL_BASED_OUTPATIENT_CLINIC_OR_DEPARTMENT_OTHER): Payer: No Typology Code available for payment source

## 2019-10-20 MED FILL — VENLAFAXINE 75MG ER: 30 days supply | Qty: 30 | Fill #2 | Status: CP

## 2019-10-20 MED FILL — CHLORTHALID 50MG: 90 days supply | Qty: 90 | Fill #2 | Status: CP

## 2019-10-20 MED FILL — ATENOLOL 50MG: 30 days supply | Qty: 60 | Fill #3 | Status: CP

## 2019-10-20 MED FILL — VENLAFAXINE  37.5MG ER: 30 days supply | Qty: 30 | Fill #1 | Status: CP

## 2019-10-20 MED FILL — VALSARTAN 320MG: 30 days supply | Qty: 30 | Fill #0

## 2019-10-26 ENCOUNTER — Ambulatory Visit: Payer: No Typology Code available for payment source

## 2019-10-26 DIAGNOSIS — N1831 Chronic kidney disease, stage 3a: Secondary | ICD-10-CM | POA: Insufficient documentation

## 2019-10-26 DIAGNOSIS — I10 Essential (primary) hypertension: Secondary | ICD-10-CM | POA: Diagnosis present

## 2019-10-26 NOTE — Progress Notes (Signed)
ephrology and Hypertension Consult Note  Requested by Overton Mam, MD    Thank you very much for consulting me regarding Sara Snyder regarding her management of hypertension and CKD.   Sara Snyder is a 64 year old with PMH of hypertension, HYP and severe anxiety. She was first diagnosed with hypertension at the age of 72. Since then she reports her BP is not very controlled. She is currently on 3 BP meds . She reports her BP ranges from 90/50 to 180 systolic. She has lot of anxiety and use clonazepam to calm her down and control her BP. She recently had some secondary hypertension workup which came out ok. She had undergone 24 hr BP monitor around 30 years back. No previous h/o kidney disease. She does have evidence of CKD on her Lab workup.       PMH:  Her past history is significant for  has a past medical history of Chronic kidney disease, stage III (moderate) (09/11/2018), HTN (hypertension), Mild episode of recurrent major depressive disorder (HCC) (01/05/2018), Panic disorder (11/29/2017), and Wears eyeglasses. She also has no past medical history of Anemia, Arthritis, Asthma, Astigmatism, Back pain, CAD (coronary artery disease), Cancer (HCC), Complete immobility due to severe physical disability or frailty (HCC), Diabetes mellitus (HCC), Elevated cholesterol, Full dentures, Heart failure (HCC), Liver disease, Lung disease, Malignant hyperthermia, MI (myocardial infarction) (HCC), Migraine, Musculoskeletal disorder, Sinusitis, Sleep apnea, Stomach disease, or Substance abuse (HCC).    Medications:    Current Outpatient Medications:     traZODone (DESYREL) 50 MG tablet, 1/2 - 1 tablet HS, PRN, Disp: 1 tablet, Rfl: 0    fluticasone (FLONASE) 50 MCG/ACT nasal spray, 1 spray by Each Nostril route daily, Disp: 1 Bottle, Rfl: 5    venlafaxine (EFFEXOR-XR) 37.5 MG 24 hr capsule, 1 tablet AM along with 75 mg-total = 112.5 mg, Disp: 30 capsule, Rfl: 5    venlafaxine (EFFEXOR-XR) 75 MG 24 hr capsule, Take 1  capsule by mouth daily Take along with 37.5 mg. TDD=112.5 mg., Disp: 30 capsule, Rfl: 5    valsartan (DIOVAN) 320 MG tablet, Take 1 tablet by mouth daily, Disp: 30 tablet, Rfl: 11    clonazePAM (KLONOPIN) 1 MG tablet, 1 tablet twice daily as needed, Disp: 60 tablet, Rfl: 2    chlorthalidone (HYGROTEN) 50 MG tablet, Take 1 tablet by mouth daily, Disp: 90 tablet, Rfl: 3    atenolol (TENORMIN) 50 MG tablet, One tab in am and one tab in pm, Disp: 60 tablet, Rfl: 6    omeprazole (PRILOSEC) 20 MG capsule, Take 1 capsule by mouth daily, Disp: 30 capsule, Rfl: 5    Multiple Vitamins-Minerals (HAIR SKIN AND NAILS FORMULA) TABS, Take 1 capsule by mouth daily, Disp: 30 tablet, Rfl: 12    Allergies:Pollen Extract  Social:  reports that she quit smoking about 42 years ago. She has a 50.00 pack-year smoking history. She has never used smokeless tobacco. She reports previous alcohol use. She reports that she does not use drugs.  Family History was negative for renal disease  ROS:   A full review of systems was negative in all ten systems other than described herein    PE: There were no vitals taken for this visit.  Labs: Results for Sara, Snyder (MRN [id number]) as of 10/26/2019 09:07   Ref. Range 08/22/2018 13:23 04/10/2019 10:04 07/20/2019 08:21   SODIUM Latest Ref Range: 136 - 145 mmol/L 140 139 140   POTASSIUM Latest Ref Range: 3.5 - 5.1 mmol/L 4.0 4.0  3.9   CHLORIDE Latest Ref Range: 98 - 107 mmol/L 104 104 104   CARBON DIOXIDE Latest Ref Range: 21 - 32 mmol/L 31 31 30    ANION GAP Latest Ref Range: 5 - 15 mmol/L 5 4 (L) 6   CALCIUM Latest Ref Range: 8.5 - 10.1 mg/dL 9.6 9.1 9.0   Glucose Random Latest Ref Range: 74 - 160 mg/dL 97 88 99   BUN (UREA NITROGEN) Latest Ref Range: 7 - 18 mg/dL 15 19 (H) 17   CREATININE Latest Ref Range: 0.4 - 1.2 mg/dL 1.3 (H) 1.1 1.3 (H)   ESTIMATED GLOMERULAR FILT RATE Latest Ref Range: >60 ML/MIN 41 (L) 50 (L) 43 (L)         Assessment/Plan:    CKD Stage III: Likely secondary to long h/o  hypertension. Will need further workup including US kidney and UA. Goal is to control risk factor    Hypertension: Since the age of 19. Recent Renin aldo and metanephrine level is unremarkable. Will need Duplex of renal artery to r/o RAS. Patient BP is very labile ranging from 90 s to 854 systolic. Likely a component of anxiety. Patient should get a 24hr BP monitor . Continue with current meds for now.     Follow up after testing,     Problem List Items Addressed This Visit     None      Visit Diagnoses     Essential hypertension, benign        Relevant Orders    DUPLEX RENAL ARTERIES         There are no Patient Instructions on file for this visit.    Please don't hesitate to contact me with any questions.  Thank you for allowing me to participate in her care  Sincerely  Lilian Coma, MD   Phone [phone]

## 2019-11-06 ENCOUNTER — Ambulatory Visit: Payer: No Typology Code available for payment source | Attending: Internal Medicine | Admitting: Internal Medicine

## 2019-11-06 ENCOUNTER — Other Ambulatory Visit: Payer: Self-pay

## 2019-11-06 ENCOUNTER — Encounter (HOSPITAL_BASED_OUTPATIENT_CLINIC_OR_DEPARTMENT_OTHER): Payer: Self-pay | Admitting: Internal Medicine

## 2019-11-06 VITALS — BP 170/84 | HR 71 | Temp 97.6°F | Ht 59.6 in

## 2019-11-06 DIAGNOSIS — I10 Essential (primary) hypertension: Secondary | ICD-10-CM | POA: Diagnosis present

## 2019-11-06 DIAGNOSIS — N1831 Chronic kidney disease, stage 3a: Secondary | ICD-10-CM

## 2019-11-06 DIAGNOSIS — R944 Abnormal results of kidney function studies: Secondary | ICD-10-CM

## 2019-11-06 DIAGNOSIS — R002 Palpitations: Secondary | ICD-10-CM

## 2019-11-06 DIAGNOSIS — R0789 Other chest pain: Secondary | ICD-10-CM | POA: Insufficient documentation

## 2019-11-06 DIAGNOSIS — F411 Generalized anxiety disorder: Secondary | ICD-10-CM | POA: Insufficient documentation

## 2019-11-06 NOTE — Progress Notes (Addendum)
CARDIOLOGY TELEVISIT PROGRESS NOTE  Date of visit: 11/06/2019    Interpreter service was used: Yes , telephone [place] interpreter used.    64 y/o [place] mother of 5 and GM of 3, today acc'd and interpreted by her dau Arlina Sabina, cell [phone],  Pt resides with her husband and one dau and one grandchild.      Confirm Patient Identity Sara Snyder ; 09-18-1955)  In summary patient is 64 year old female with the following medical problems:  Anxiety state  Decreased GFR  Essential hypertension  Palpitations   Stage 3a chronic kidney disease    Diastolic dysfunction on ECHO       She was referred by Dr. Raynelle Fanning  for concerns of SVT and VPC's  on Holter of Sept 2020.  Marland Kitchen PMH significant for hypertension, hyperlipidemia.    Patient had a Holter and echocardiogram about 1 to 2 months ago which revealed grade 2 diastolic dysfunction on echo.  Primary care physician also has suspicion of SVT and VT on Holter, for which patient was referred to our cardiology clinic for evaluation.    Patient had a palpitation with panic attack for many years, once or twice a week.  She also complains chest pain around her right breast area.  Nonexertional.  She was found mass on mammogram.  Her blood pressure has been labile with SBP from 90-1 90.  She has lightheadedness with bending down.  No history of syncope.  She feels extremely fatigue and shortness of breath with doing minimal housework.  She does not do any regular exercise during the pandemic.      Current Outpatient Medications:     traZODone (DESYREL) 50 MG tablet, 1/2 - 1 tablet HS, PRN, Disp: 1 tablet, Rfl: 0    fluticasone (FLONASE) 50 MCG/ACT nasal spray, 1 spray by Each Nostril route daily, Disp: 1 Bottle, Rfl: 5    venlafaxine (EFFEXOR-XR) 37.5 MG 24 hr capsule, 1 tablet AM along with 75 mg-total = 112.5 mg, Disp: 30 capsule, Rfl: 5    venlafaxine (EFFEXOR-XR) 75 MG 24 hr capsule, Take 1 capsule by mouth daily Take along with 37.5 mg. TDD=112.5 mg., Disp:  30 capsule, Rfl: 5    valsartan (DIOVAN) 320 MG tablet, Take 1 tablet by mouth daily, Disp: 30 tablet, Rfl: 11    clonazePAM (KLONOPIN) 1 MG tablet, 1 tablet twice daily as needed, Disp: 60 tablet, Rfl: 2    chlorthalidone (HYGROTEN) 50 MG tablet, Take 1 tablet by mouth daily, Disp: 90 tablet, Rfl: 3    atenolol (TENORMIN) 50 MG tablet, One tab in am and one tab in pm, Disp: 60 tablet, Rfl: 6    omeprazole (PRILOSEC) 20 MG capsule, Take 1 capsule by mouth daily, Disp: 30 capsule, Rfl: 5    Multiple Vitamins-Minerals (HAIR SKIN AND NAILS FORMULA) TABS, Take 1 capsule by mouth daily, Disp: 30 tablet, Rfl: 12    Review of Patient's Allergies indicates:   Pollen extract          Cough    SOCIAL & FAMILY HISTORY: No fam hx heart dis. No substances.    Review of systems: All other systems reviewed were pertinently negative apart from the history of present illness.    PHYSICAL EXAM:      PHYSICAL EXAM:  170/84.  71.  Afeb.  97%.  5\' 0" ,. 187 lbs, BMIO 37.      General:  Alert and comfortable.  There were no vitals filed for this  visit.  HEENT: Ocular muscles are intact and the face is symmetric with midline tongue.  NECK:  No palpable cervical nodes or thyroid, with no JVD.  CHEST: Clear to auscultation, no crackles or rhonchi.  HEART: Regular with no  murmurs, clicks, or gallops/  ABDOMEN: No palpable organs, masses, or tenderness.  EXTREMITIES: No rash, clubbing, or cyanosis, with no edema.  NEURO: A&O X 3, no obvious motor or sensory deficits  PSYCHIATRIC: Mood and affect are appropriate.  SKIN: No rash      PERTINENT INVESTIGATIONS:    1. Last EKG in our record: (on my personal review)   Vent. Rate : 059 BPM   Atrial Rate : 059 BPM    P-R Int : 168 ms     QRS Dur : 098 ms    QT Int : 424 ms       P-R-T Axes : 068 -15 -02 degrees    QTc Int : 419 ms        Sinus bradycardia    Moderate voltage criteria for LVH, may be normal variant    Borderline ECG    No previous ECGs available      06 Nov 2019:  NSR 61.  Very low amp P's -- V1 best seen.  Normal.    2. LABS:   Lab Results   Component Value Date    NA 140 07/20/2019    K 3.9 07/20/2019    CL 104 07/20/2019    CO2 30 07/20/2019    BUN 17 07/20/2019    CREAT 1.3 (H) 07/20/2019    GLUCOSER 99 07/20/2019     Lab Results   Component Value Date    LDL 159 04/10/2019    HDL 43 04/10/2019    TG 169 (H) 04/10/2019     No results found for: PROBNP  No results found for: BNP    3. ECHOCARDIOGRAM March 16, 2019:   1. 60%. No RWMA's. Gr 2 DD. Asc ao 37 mm..       4. STRESS TEST:     5. HOLTER in March 17, 2019:  NSR 70, 52 - 100.  600 APC = 0.6T, 15 AC, 7 short slow SVT.600 VPC - 0.6%, 2 VC, no VT.  No sxs.         ASSESSMENT/ PLAN:    (R00.2) Palpitations  (primary encounter diagnosis)  Comment:  Holter ordered by Dr. Raynelle Fanning last Sept to eval      Was already on atenolol.  No further treatment or work-up is indicated given the small burden of VPCs in a patient with no wall motion abnormality on echo and chronic mild symptoms.    Reports palps with anxiety.  Becomes tearful, dau trying to help her answer questions.  Palps -- feels like heart is beating faster -- lies down until it subsides over an hour.  Occ takes Ativan for it.  No falling.  + vertigo hx. -- "labyrinthitis". - could last a week at a time.  "Head feels bad", can last hours, all day long.  Does not sound like cardiac palps.  No further assessment unless spontaneous hemodynamic c/o's.  She tends to respond affirmatively to virtually all questions re sxs, so better to await spontaneous c/o's    CHEST PRESSURE  May 2021 reports precordial pressure -- "anguish or pressure -- tightness".  Goes off on tangent about meds, depression, bones, etc etc.  Cautioned to answer questions explicitly.  Dau assists -- twice per  day, lasting hours.  No radiation.  Can be severe.  Loquacious -- very poor historian -- doesn't address questions, rambles.  With and without exertion,  Esp when sad -- can't say if  assoc'd  with panic attacks.  No assoc'd diaphoresis, + nausea, some.  Advised doesn't sound cardiac.    Negligible exercise.  Most vigorous activity is cooking.  No stairs.  Pillows x 2.  Nocturia x 2.  Exam negative.    (I49.3) PVC (premature ventricular contraction)  Comment: Same as above.    (I10) Essential hypertension  Comment: Labile blood pressure.  She is on chlorthalidone, atenolol and valsartan.  Asked patient to record blood pressure twice a day and reevaluate with in person visit in a month.  40 May:  She didn't do it -- brings the wrist monitor but no written numbers -- instructed to prepare 60 BP's with left column of AM's and right column of PM's.      (E78.5) Hyperlipidemia, unspecified hyperlipidemia type  Comment: 10 year ASCVD risk 12.1%. Will qualify asprin and statin treatment for primary ASCVD prevention.  Wanted to defer Rx.    My impression is that of non-cardiac chest pressure -- hours, non-exertional, + with emotions.  She has negligible palps - had no sxs during the Holter.    I'm going to leave the BP issue to Dr. Annie Sable -- instructed to prepare the BP diary and take it to her.  The issue of palps is not of concern, nor is the chest pressure.    I'll see her again in 6 months.  BP issue to be handled by PCP.

## 2019-11-07 LAB — EKG

## 2019-11-07 NOTE — Addendum Note (Signed)
Addended by: Lateya Dauria on: 11/07/2019 10:11 AM     Modules accepted: Orders

## 2019-11-08 ENCOUNTER — Ambulatory Visit: Payer: No Typology Code available for payment source | Admitting: "Psychiatric/Mental Health

## 2019-11-08 ENCOUNTER — Other Ambulatory Visit (HOSPITAL_BASED_OUTPATIENT_CLINIC_OR_DEPARTMENT_OTHER): Payer: Self-pay | Admitting: "Psychiatric/Mental Health

## 2019-11-08 NOTE — Progress Notes (Signed)
.  Psychopharm appt today. Called patient with an interpreter; no answer; left vmail. Called daughter as directed in Demographics; no answer; left vmail

## 2019-11-08 NOTE — Progress Notes (Signed)
Erroneous encounter

## 2019-11-09 LAB — ALDOSTERONE/RENIN ACTIVITY
ALDOSTERONE/RENIN RATIO: 7.9 (ref 0.0–30.0)
ALDOSTERONE: 3.1 ng/dL (ref 0.0–30.0)
RENIN PLASMA ACTIVITY: 0.39 (ref 0.167–5.380)

## 2019-11-09 LAB — METANEPHRINES FRACTION PLASMA
METANEPHRINE,PL: 10.7 pg/mL (ref 0.0–88.0)
NORMETANEPHRINE, PL: 223.5 pg/mL — AB (ref 0.0–191.8)

## 2019-11-13 ENCOUNTER — Ambulatory Visit (HOSPITAL_BASED_OUTPATIENT_CLINIC_OR_DEPARTMENT_OTHER)
Admission: RE | Admit: 2019-11-13 | Discharge: 2019-11-13 | Disposition: A | Discharge status: Home / Self Care | Payer: No Typology Code available for payment source | Source: Ambulatory Visit

## 2019-11-13 ENCOUNTER — Other Ambulatory Visit: Payer: Self-pay

## 2019-11-13 ENCOUNTER — Ambulatory Visit
Admission: RE | Admit: 2019-11-13 | Discharge: 2019-11-13 | Disposition: A | Discharge status: Home / Self Care | Payer: No Typology Code available for payment source

## 2019-11-13 DIAGNOSIS — I10 Essential (primary) hypertension: Secondary | ICD-10-CM | POA: Diagnosis present

## 2019-11-13 DIAGNOSIS — N189 Chronic kidney disease, unspecified: Secondary | ICD-10-CM | POA: Insufficient documentation

## 2019-11-13 DIAGNOSIS — N1831 Chronic kidney disease, stage 3a: Secondary | ICD-10-CM

## 2019-11-13 LAB — MICROALBUMIN RANDOM URINE
ALB/CREAT RATIO URINE RAN: 8 ug/mg (ref 0–30)
ALBUMIN URINE RANDOM: 0.8 mg/dL (ref 0.0–1.9)

## 2019-11-13 LAB — CBC, PLATELET & DIFFERENTIAL
ABSOLUTE BASO COUNT: 0 10*3/uL (ref 0.0–0.1)
ABSOLUTE EOSINOPHIL COUNT: 0.1 10*3/uL (ref 0.0–0.8)
ABSOLUTE IMM GRAN COUNT: 0.03 10*3/uL (ref 0.00–0.03)
ABSOLUTE LYMPH COUNT: 2.6 10*3/uL (ref 0.6–5.9)
ABSOLUTE MONO COUNT: 0.4 10*3/uL (ref 0.2–1.4)
ABSOLUTE NEUTROPHIL COUNT: 3.5 10*3/uL (ref 1.6–8.3)
ABSOLUTE NRBC COUNT: 0 10*3/uL (ref 0.0–0.0)
BASOPHIL %: 0.4 % (ref 0.0–1.2)
EOSINOPHIL %: 1.9 % (ref 0.0–7.0)
HEMATOCRIT: 36.6 % (ref 34.1–44.9)
HEMOGLOBIN: 12.2 g/dL (ref 11.2–15.7)
IMMATURE GRANULOCYTE %: 0.4 % (ref 0.0–0.4)
LYMPHOCYTE %: 39.3 % (ref 15.0–54.0)
MEAN CORP HGB CONC: 33.3 g/dL (ref 31.0–37.0)
MEAN CORPUSCULAR HGB: 29.1 pg (ref 26.0–34.0)
MEAN CORPUSCULAR VOL: 87.4 fl (ref 80.0–100.0)
MEAN PLATELET VOLUME: 10.8 fL (ref 8.7–12.5)
MONOCYTE %: 5.4 % (ref 4.0–13.0)
NEUTROPHIL %: 52.6 % (ref 40.0–75.0)
NRBC %: 0 % (ref 0.0–0.0)
PLATELET COUNT: 273 10*3/uL (ref 150–400)
RBC DISTRIBUTION WIDTH STD DEV: 40.9 fL (ref 35.1–46.3)
RED BLOOD CELL COUNT: 4.19 M/uL (ref 3.90–5.20)
WHITE BLOOD CELL COUNT: 6.7 10*3/uL (ref 4.0–11.0)

## 2019-11-13 LAB — URINALYSIS
BILIRUBIN, URINE: NEGATIVE
CASTS: NONE SEEN PER LPF
CRYSTALS: NONE SEEN
GLUCOSE, URINE: NEGATIVE MG/DL
KETONE, URINE: NEGATIVE MG/DL
NITRITE, URINE: NEGATIVE
OCCULT BLOOD, URINE: NEGATIVE
PH URINE: 6 (ref 5.0–8.0)
PROTEIN, URINE: NEGATIVE MG/DL
SPECIFIC GRAVITY URINE: 1.02 (ref 1.003–1.035)

## 2019-11-13 LAB — BASIC METABOLIC PANEL
ANION GAP: 4 mmol/L — ABNORMAL LOW (ref 5–15)
BUN (UREA NITROGEN): 23 mg/dL — ABNORMAL HIGH (ref 7–18)
CALCIUM: 9.3 mg/dL (ref 8.5–10.1)
CARBON DIOXIDE: 32 mmol/L (ref 21–32)
CHLORIDE: 102 mmol/L (ref 98–107)
CREATININE: 1.1 mg/dL (ref 0.4–1.2)
ESTIMATED GLOMERULAR FILT RATE: 53 mL/min — ABNORMAL LOW (ref >60)
Glucose Random: 93 mg/dL (ref 74–160)
POTASSIUM: 4.1 mmol/L (ref 3.5–5.1)
SODIUM: 138 mmol/L (ref 136–145)

## 2019-11-13 LAB — PROTEIN/CREAT URINE RANDOM
CREATININE RANDOM URINE: 98 mg/dl
PROT/CREAT RATIO URINE RANDOM: 0.1 mg/mg Cr (ref <0.16)
TOTAL PROTEIN RANDOM URINE: 10 mg/dL (ref 0–15)

## 2019-11-21 ENCOUNTER — Ambulatory Visit: Payer: No Typology Code available for payment source | Attending: Internal Medicine | Admitting: Internal Medicine

## 2019-11-21 DIAGNOSIS — Z8616 Personal history of COVID-19: Secondary | ICD-10-CM | POA: Insufficient documentation

## 2019-11-21 DIAGNOSIS — I10 Essential (primary) hypertension: Secondary | ICD-10-CM

## 2019-11-21 DIAGNOSIS — F411 Generalized anxiety disorder: Secondary | ICD-10-CM | POA: Insufficient documentation

## 2019-11-21 DIAGNOSIS — E349 Endocrine disorder, unspecified: Secondary | ICD-10-CM

## 2019-11-21 DIAGNOSIS — R7989 Other specified abnormal findings of blood chemistry: Secondary | ICD-10-CM

## 2019-11-21 MED ORDER — TRAZODONE HCL 100 MG PO TABS
ORAL_TABLET | ORAL | 2 refills | Status: DC
Start: 2019-11-21 — End: 2020-05-23

## 2019-11-21 MED FILL — VALSARTAN 320MG: 30 days supply | Qty: 30 | Fill #0 | Status: CP

## 2019-11-21 NOTE — Progress Notes (Signed)
HPI   Anxiety and depression: pt is on clonazepam and effexor which they think is not working well , not enough dose   HTN : atenolol , chlorthalidone , valsartan   History of covid 19 ; very nervous about it , thankful that the entire family has recovered           Social History     Socioeconomic History    Marital status: Married     Spouse name: Not on file    Number of children: Not on file    Years of education: Not on file    Highest education level: Not on file   Occupational History    Not on file   Tobacco Use    Smoking status: Former Smoker     Packs/day: 2.00     Years: 25.00     Pack years: 50.00     Quit date: 10/27/1977     Years since quitting: 42.0    Smokeless tobacco: Never Used   Substance and Sexual Activity    Alcohol use: Not Currently    Drug use: Never    Sexual activity: Yes     Partners: Male   Other Topics Concern    Not on file   Social History Narrative    Originally from [place], [place]    History of childhood trauma    Lives with family   Social Determinants of Health  Financial Resource Strain:     Difficulty of Paying Living Expenses:   Food Insecurity:     Worried About Programme researcher, broadcasting/film/video in the Last Year:     Barista in the Last Year:   Transportation Needs:     Freight forwarder (Medical):     Lack of Transportation (Non-Medical):   Physical Activity:     Days of Exercise per Week:     Minutes of Exercise per Session:   Stress:     Feeling of Stress :   Social Connections:     Frequency of Communication with Friends and Family:     Frequency of Social Gatherings with Friends and Family:     Attends Religious Services:     Active Member of Clubs or Organizations:     Attends Engineer, structural:     Marital Status:   Intimate Partner Violence:     Fear of Current or Ex-Partner:     Emotionally Abused:     Physically Abused:     Sexually Abused:   Patient Active Problem List:     Essential hypertension      Labyrinthitis     Panic disorder     Hyperlipidemia     Chronic cough     Mild episode of recurrent major depressive disorder (HCC)     Allergic rhinitis     Need for prophylactic vaccination and inoculation against influenza     Left arm pain     Stage 3a chronic kidney disease     Decreased GFR     Anxiety state     Palpitations     Psychophysiological insomnia     COVID-19    Past Surgical History:  10/2016: HEMORRHOID SURGERY  No date: OB ANTEPARTUM CARE CESAREAN DLVR & POSTPARTUM  No family history on file.    traZODone (DESYREL) 100 MG tablet, One tab po nightly, Disp: 90 tablet, Rfl: 2  fluticasone (FLONASE) 50 MCG/ACT nasal spray, 1 spray by Each Nostril route daily, Disp:  1 Bottle, Rfl: 5  venlafaxine (EFFEXOR-XR) 37.5 MG 24 hr capsule, 1 tablet AM along with 75 mg-total = 112.5 mg, Disp: 30 capsule, Rfl: 5  venlafaxine (EFFEXOR-XR) 75 MG 24 hr capsule, Take 1 capsule by mouth daily Take along with 37.5 mg. TDD=112.5 mg., Disp: 30 capsule, Rfl: 5  valsartan (DIOVAN) 320 MG tablet, Take 1 tablet by mouth daily, Disp: 30 tablet, Rfl: 11  clonazePAM (KLONOPIN) 1 MG tablet, 1 tablet twice daily as needed, Disp: 60 tablet, Rfl: 2  chlorthalidone (HYGROTEN) 50 MG tablet, Take 1 tablet by mouth daily, Disp: 90 tablet, Rfl: 3  atenolol (TENORMIN) 50 MG tablet, One tab in am and one tab in pm, Disp: 60 tablet, Rfl: 6  omeprazole (PRILOSEC) 20 MG capsule, Take 1 capsule by mouth daily, Disp: 30 capsule, Rfl: 5  Multiple Vitamins-Minerals (HAIR SKIN AND NAILS FORMULA) TABS, Take 1 capsule by mouth daily, Disp: 30 tablet, Rfl: 12    No current facility-administered medications for this visit.    Review of Patient's Allergies indicates:   Pollen extract          Cough  ROS            There were no vitals taken for this visit.  Physical Exam              (F41.1) Anxiety state  (primary encounter diagnosis)  Comment: claims that the effexor and clonazepam are doing anything at all   Plan: sent a note to psych that she  needs reevaluation around the meds that are prescribed  The current regimen is not helping her at all     (I10) Benign essential HTN  Comment: in [place] captopril was her meds,   Plan: atenolol , chlorthalidone, and valsartan , not working very well   Doesn't have extra med to control her bp as needed      (Z86.16) History of COVID-19  Comment:  Plan:recovered     CREATININE (mg/dL)   Date Value   11/13/2019 1.1   07/20/2019 1.3 (H)   04/10/2019 1.1   A 72 yr olf with h/o HTN and anxiety ,   Multiple meds with no control of both of the conditions  Abnormal renal functions : likely due to the fluctuations in the BP , renal ultrasound : normal   Likely prerenal   Elevated IPTH : may need evaluation with endocrinology   Depression/Anxiety : she is on multiple meds , which is not helping her at all   likely organic etiology causing her the problems   Will get endocrine opinion around her abnormal labs   If cleared we would assume that this is due to anxiety that is causing her the anxiety   Insomnia : doesn't sleep at night , has erratic sleep schedule due to exposure to screens  Counseled about sleep hygiene: night time ritual   No naps :   No caffeinated beverages   No bluelight ; ( TV/phone)   elevated norepinephrine  Will send her to endo for further evaluation               We discussed the importance of medication compliance. The patient was ready to learn and no apparent learning barriers were identified. I explained the diagnosis and treatment plan, and the patient expressed understanding of the content. Possible side effects of the prescribed medication(s) were explained, .  I attempted to answer any questions regarding the diagnosis and the proposed treatment.    We  discussed the patients current medications. The patient expressed understanding and no barriers to adherence were identified.      Shaune Pascal, MD

## 2019-11-22 ENCOUNTER — Other Ambulatory Visit (HOSPITAL_BASED_OUTPATIENT_CLINIC_OR_DEPARTMENT_OTHER): Payer: Self-pay | Admitting: "Psychiatric/Mental Health

## 2019-11-22 MED ORDER — CLONAZEPAM 1 MG PO TABS
ORAL_TABLET | ORAL | 2 refills | Status: DC
Start: 2019-11-22 — End: 2020-01-31

## 2019-11-22 MED FILL — TRAZODONE 100MG: 90 days supply | Qty: 90 | Fill #0

## 2019-11-22 MED FILL — CLONAZEPAM 1MG: 30 days supply | Qty: 60 | Fill #0

## 2019-12-06 NOTE — Progress Notes (Signed)
Left a message to discuss lab result.

## 2019-12-14 MED FILL — ATENOLOL 50MG: 30 days supply | Qty: 60 | Fill #5 | Status: CP

## 2019-12-14 MED FILL — VENLAFAXINE 75MG ER: 30 days supply | Qty: 30 | Fill #4 | Status: CP

## 2019-12-14 MED FILL — CLONAZEPAM 1MG: 30 days supply | Qty: 60 | Fill #1 | Status: CP

## 2019-12-14 MED FILL — VENLAFAXINE  37.5MG ER: 30 days supply | Qty: 30 | Fill #3 | Status: CP

## 2019-12-15 MED FILL — VALSARTAN 320MG: 30 days supply | Qty: 30 | Fill #1 | Status: CP

## 2019-12-26 ENCOUNTER — Ambulatory Visit: Payer: No Typology Code available for payment source | Attending: Neurology | Admitting: Neurology

## 2019-12-26 DIAGNOSIS — R413 Other amnesia: Secondary | ICD-10-CM | POA: Diagnosis present

## 2019-12-26 NOTE — Progress Notes (Signed)
NEUROLOGY CONSULTATION    REASON FOR CONSULTATION: Consultation requested by Lester Kinsman, PA-C.  Reason for consultation: "memory concerns".     HPI:       Patient is a 64 year old female who report a one-year h/o gradual onset, progressive difficulties with her memory.  She is told something and then does not remember it.  May forget to turn off the stove or other appliances.  This may happen even if she remains in the same room with the appliance.  No fires to date.  Forgets names.  In her efforts to learn English, it has been hard for her to memorize words. No one has told her that they are aware of a change in her memory.      Patient does not know why she is having a change in her memory, although she does report that she has a lot of anxiety and is very depressed (has cried daily for the past 3, 4 years).  Has seen a psychiatrist and has taken venlafaxine.  (Current dose is 112.5 mg po q day.)  She is currently under the care of Felix Pacini, RNCS, but all recent visits have been over the phone, and patient would prefer in-person visit.    Results for Sara Snyder, Sara Snyder (MRN [id number]) as of 12/26/2019 15:00   Ref. Range 11/13/2019 10:05   SODIUM Latest Ref Range: 136 - 145 mmol/L 138   POTASSIUM Latest Ref Range: 3.5 - 5.1 mmol/L 4.1   CHLORIDE Latest Ref Range: 98 - 107 mmol/L 102   CARBON DIOXIDE Latest Ref Range: 21 - 32 mmol/L 32   ANION GAP Latest Ref Range: 5 - 15 mmol/L 4 (L)   CALCIUM Latest Ref Range: 8.5 - 10.1 mg/dL 9.3   Glucose Random Latest Ref Range: 74 - 160 mg/dL 93   BUN (UREA NITROGEN) Latest Ref Range: 7 - 18 mg/dL 23 (H)   CREATININE Latest Ref Range: 0.4 - 1.2 mg/dL 1.1   ESTIMATED GLOMERULAR FILT RATE Latest Ref Range: >60 ML/MIN 53 (L)   ALBUMIN URINE RANDOM Latest Ref Range: 0.0 - 1.9 mg/dL 0.8   ALB/CREAT RATIO URINE RAN Latest Ref Range: 0 - 30 ug/mg 8   CREATININE RANDOM URINE Latest Ref Range: NOT ESTAB mg/dl 98   PROT/CREAT RATIO URINE RANDOM Latest Ref Range: <0.16 mg/mg Cr 0.10    TOTAL PROTEIN RANDOM URINE Latest Ref Range: 0 - 15 mg/dL 10   WHITE BLOOD CELL COUNT Latest Ref Range: 4.0 - 11.0 TH/uL 6.7   RED BLOOD CELL COUNT Latest Ref Range: 3.90 - 5.20 M/uL 4.19   HEMOGLOBIN Latest Ref Range: 11.2 - 15.7 g/dL 12.2   HEMATOCRIT Latest Ref Range: 34.1 - 44.9 % 36.6   MEAN CORPUSCULAR VOL Latest Ref Range: 80.0 - 100.0 fl 87.4   MEAN CORPUSCULAR HGB Latest Ref Range: 26.0 - 34.0 pg 29.1   MEAN CORP HGB CONC Latest Ref Range: 31.0 - 37.0 g/dL 33.3   RBC DISTRIBUTION WIDTH STD DEV Latest Ref Range: 35.1 - 46.3 fL 40.9   PLATELET COUNT Latest Ref Range: 150 - 400 TH/uL 273   MEAN PLATELET VOLUME Latest Ref Range: 8.7 - 12.5 fL 10.8   NEUTROPHIL % Latest Ref Range: 40.0 - 75.0 % 52.6   IMMATURE GRANULOCYTE % Latest Ref Range: 0.0 - 0.4 % 0.4   LYMPHOCYTE % Latest Ref Range: 15.0 - 54.0 % 39.3   MONOCYTE % Latest Ref Range: 4.0 - 13.0 % 5.4   EOSINOPHIL % Latest Ref  Range: 0.0 - 7.0 % 1.9   BASOPHIL % Latest Ref Range: 0.0 - 1.2 % 0.4   NRBC % Latest Ref Range: 0.0 - 0.0 % 0.0   ABSOLUTE NEUTROPHIL COUNT Latest Ref Range: 1.6 - 8.3 TH/uL 3.5   ABSOLUTE IMM GRAN COUNT Latest Ref Range: 0.00 - 0.03 TH/uL 0.03   ABSOLUTE LYMPH COUNT Latest Ref Range: 0.6 - 5.9 TH/uL 2.6   ABSOLUTE MONO COUNT Latest Ref Range: 0.2 - 1.4 TH/uL 0.4   ABSOLUTE EOSINOPHIL COUNT Latest Ref Range: 0.0 - 0.8 TH/uL 0.1   ABSOLUTE BASO COUNT Latest Ref Range: 0.0 - 0.1 TH/uL 0.0   ABSOLUTE NRBC COUNT Latest Ref Range: 0.0 - 0.0 TH/uL 0.0   COLOR Latest Ref Range: YELLOW  YELLOW   CLARITY Latest Ref Range: CLEAR  CLEAR   GLUCOSE, URINE Latest Ref Range: NEGATIVE MG/DL NEGATIVE   BILIRUBIN, URINE Latest Ref Range: NEGATIVE  NEGATIVE   KETONE, URINE Latest Ref Range: NEGATIVE MG/DL NEGATIVE   SPECIFIC GRAVITY URINE Latest Ref Range: 1.003 - 1.035  1.020   OCCULT BLOOD, URINE Latest Ref Range: NEGATIVE  NEGATIVE   PH URINE Latest Ref Range: 5.0 - 8.0  6.0   PROTEIN, URINE Latest Ref Range: NEGATIVE MG/DL NEGATIVE   NITRITE,  URINE Latest Ref Range: NEGATIVE  NEGATIVE   LEUKOCYTE ESTERASE Latest Ref Range: NEGATIVE  TRACE (A)   MICROSCOPIC Latest Ref Range: NOT INDICAT  SEE RESULTS (A)   WHITE BLOOD CELLS URINE Latest Ref Range: 0 - 4 PER HPF FEW 3-4   RED BLOOD CELLS URINE Latest Ref Range: 0 - 2 PER HPF RARE 0-2   BACTERIA Latest Ref Range: 0 - 5 PER HPF 10-50 (A)   CRYSTALS Latest Ref Range: NONE SEEN  NONE SEEN   CASTS Latest Ref Range: NONE SEEN PER LPF NONE SEEN   SQUAMOUS EPITHELIAL CELLS Latest Ref Range: 0 - 4 PER LPF 5-10 (A)       PMH:       Illnesses -   Patient Active Problem List:     Essential hypertension     Labyrinthitis     Panic disorder     Hyperlipidemia     Chronic cough     Mild episode of recurrent major depressive disorder (HCC)     Allergic rhinitis     Need for prophylactic vaccination and inoculation against influenza     Left arm pain     Stage 3a chronic kidney disease     Decreased GFR     Anxiety state     Palpitations     Psychophysiological insomnia     COVID-19         Surgery - hemorrhoid surgery, 2 c-sections.  Past Surgical History:  10/2016: HEMORRHOID SURGERY  No date: OB ANTEPARTUM CARE CESAREAN DLVR & POSTPARTUM         Medications - venlafaxine 112.5 mg po q day, anti-HTN (valsartan, atenolol, chlorthalidone) lorazepam prn sleep, trazodone, omeprazole  clonazePAM (KLONOPIN) 1 MG tablet, 1 tablet twice daily as needed, Disp: 60 tablet, Rfl: 2  traZODone (DESYREL) 100 MG tablet, One tab po nightly, Disp: 90 tablet, Rfl: 2  fluticasone (FLONASE) 50 MCG/ACT nasal spray, 1 spray by Each Nostril route daily, Disp: 1 Bottle, Rfl: 5  venlafaxine (EFFEXOR-XR) 37.5 MG 24 hr capsule, 1 tablet AM along with 75 mg-total = 112.5 mg, Disp: 30 capsule, Rfl: 5  venlafaxine (EFFEXOR-XR) 75 MG 24 hr capsule, Take 1 capsule by mouth  daily Take along with 37.5 mg. TDD=112.5 mg., Disp: 30 capsule, Rfl: 5  valsartan (DIOVAN) 320 MG tablet, Take 1 tablet by mouth daily, Disp: 30 tablet, Rfl: 11  chlorthalidone  (HYGROTEN) 50 MG tablet, Take 1 tablet by mouth daily, Disp: 90 tablet, Rfl: 3  atenolol (TENORMIN) 50 MG tablet, One tab in am and one tab in pm, Disp: 60 tablet, Rfl: 6  omeprazole (PRILOSEC) 20 MG capsule, Take 1 capsule by mouth daily, Disp: 30 capsule, Rfl: 5  Multiple Vitamins-Minerals (HAIR SKIN AND NAILS FORMULA) TABS, Take 1 capsule by mouth daily, Disp: 30 tablet, Rfl: 12    No current facility-administered medications on file prior to visit.         Allergies - pollen extract    FH: No family history of memory difficulties/dementia.    SH:       Smoking - no       Etoh - no       Drugs - no       Education - 3rd grade       Work - not outside the house       Marital - yes.  50th wedding anniversary will be next month       Children - 5 children    EXAMINATION: Televisit.  Examination could not be performed.    FORMULATION:    Memory difficulties arising in the context of severe depression (crying every day for the last 3, 4 years).  DDx includes depressive pseudodementia, genuine dementia (Alzheimer's or other), minimal cognitive impairment, etc.     Pt would like to f/u in person with psychiatric nurse Felix Pacini, and I have inquired of Ms. Lonia Farber if that would be possible.  Also, I have ordered MRI of the brain, CMP, TSH, vitamin B12, MMA, folate, ESR, and RPR.    I will see the patient in f/u after the above have been completed.      Signed:     Cyndi Bender, M.D.  Neurologist      Addendum: The patient was seen with the assistance of a Portuguese/English interpreter available via telephone.  More than half of the 45-minute visit was spent in counseling and coordination of care pertaining to the above.

## 2020-01-03 ENCOUNTER — Ambulatory Visit: Payer: No Typology Code available for payment source | Attending: Psychiatry | Admitting: "Psychiatric/Mental Health

## 2020-01-03 DIAGNOSIS — F411 Generalized anxiety disorder: Secondary | ICD-10-CM | POA: Insufficient documentation

## 2020-01-03 DIAGNOSIS — F3341 Major depressive disorder, recurrent, in partial remission: Secondary | ICD-10-CM

## 2020-01-03 DIAGNOSIS — F410 Panic disorder [episodic paroxysmal anxiety] without agoraphobia: Secondary | ICD-10-CM | POA: Diagnosis not present

## 2020-01-03 MED ORDER — VENLAFAXINE HCL ER 150 MG PO CP24
ORAL_CAPSULE | ORAL | 2 refills | Status: DC
Start: 2020-01-03 — End: 2020-04-30

## 2020-01-03 NOTE — Progress Notes (Signed)
ADULT OUTPATIENT PSYCHIATRY (OPD)  PSYCHOPHARM FOLLOW UP    LANGUAGE OF CARE:    [place]      LANGUAGE NEEDS MET:    Telephone Interpreter    CHIEF COMPLAINT:     SUBJECTIVE DATA/INTERVAL HISTORY:   Sara Snyder is assessed in a televisit with a [place] interpreter.  She recently had an appt w/ Dr Kasandra Knudsen d/t memory difficulty.   She is fully vaccinated.  She reports she doesn't feel good and "the medicines aren't helping me."  Spoke with daughter  She reports some anxiety, depression, irritability, anger, and worry. She denies hopelessness, guilt, racing thoughts, rumination, or SI. Energy, motivation, and appetit, sleep are fair-normal. She is not hypomanic or overtly psychotic.Marland Kitchen     OBJECTIVE DATA:   CURRENT MEDICATIONS:    Current Outpatient Medications   Medication Sig    traZODone (DESYREL) 50 MG tablet 1/2 - 1 tablet HS, PRN    fluticasone (FLONASE) 50 MCG/ACT nasal spray 1 spray by Each Nostril route daily    benzonatate (TESSALON) 100 MG capsule Take 1 capsule by mouth 3 (three) times daily as needed for Cough  for up to 20 days    venlafaxine (EFFEXOR-XR) 37.5 MG 24 hr capsule 1 tablet AM along with 75 mg-total = 112.5 mg    venlafaxine (EFFEXOR-XR) 75 MG 24 hr capsule Take 1 capsule by mouth daily Take along with 37.5 mg. TDD=112.5 mg.    valsartan (DIOVAN) 320 MG tablet Take 1 tablet by mouth daily    clonazePAM (KLONOPIN) 1 MG tablet 1 tablet twice daily as needed    chlorthalidone (HYGROTEN) 50 MG tablet Take 1 tablet by mouth daily    atenolol (TENORMIN) 50 MG tablet One tab in am and one tab in pm    omeprazole (PRILOSEC) 20 MG capsule Take 1 capsule by mouth daily    meclizine (ANTIVERT) 25 MG TABS Take 1 tablet by mouth every 8 (eight) hours as needed    Multiple Vitamins-Minerals (HAIR SKIN AND NAILS FORMULA) TABS Take 1 capsule by mouth daily    loratadine (CLARITIN) 10 MG tablet Take 1 tablet by mouth daily    calcium-vitamin D (OSCAL-500) 500-400 MG-UNIT per tablet  Take 1 tablet by mouth 2 (two) times daily    azelastine (ASTELIN) 0.1 % nasal spray 1 spray by Each Nostril route 2 (two) times daily Use in each nostril as directed    acetaminophen (TYLENOL) 500 MG tablet Take 1 tablet by mouth every 6 (six) hours as needed for Pain     No current facility-administered medications for this visit.           VITAL SIGNS:   There were no vitals filed for this visit.     LABS:    WHITE BLOOD CELL COUNT (TH/uL)   Date Value   04/10/2019 5.8     RED BLOOD CELL COUNT (M/uL)   Date Value   04/10/2019 4.14     HEMOGLOBIN (g/dL)   Date Value   04/10/2019 12.2     No results found for: IDEALHCT  HEMATOCRIT (%)   Date Value   04/10/2019 37.5     No results found for: HCTDIFF  MEAN CORPUSCULAR VOL (fl)   Date Value   04/10/2019 90.6     MEAN CORPUSCULAR HGB (pg)   Date Value   04/10/2019 29.5     MEAN CORP HGB CONC (g/dL)   Date Value   04/10/2019 32.5     No results found  for: RDW  PLATELET COUNT (TH/uL)   Date Value   04/10/2019 259     MEAN PLATELET VOLUME (fL)   Date Value   04/10/2019 10.7     NEUTROPHIL % (%)   Date Value   04/10/2019 56.9     LYMPHOCYTE % (%)   Date Value   04/10/2019 34.9     MONOCYTE % (%)   Date Value   04/10/2019 5.3     EOSINOPHIL % (%)   Date Value   04/10/2019 2.1     BASOPHIL % (%)   Date Value   04/10/2019 0.5           ALBUMIN (g/dL)   Date Value   04/10/2019 3.8     ALKALINE PHOSPHATASE (U/L)   Date Value   04/10/2019 117     ALANINE AMINOTRANSFERASE (U/L)   Date Value   04/10/2019 30     ASPARTATE AMINOTRANSFERASE (U/L)   Date Value   04/10/2019 21     Glucose Random (mg/dL)   Date Value   07/20/2019 99     BUN (UREA NITROGEN) (mg/dL)   Date Value   07/20/2019 17     CALCIUM (mg/dL)   Date Value   07/20/2019 9.0     CHLORIDE (mmol/L)   Date Value   07/20/2019 104     CARBON DIOXIDE (mmol/L)   Date Value   07/20/2019 30     CREATININE (mg/dL)   Date Value   07/20/2019 1.3 (H)     POTASSIUM (mmol/L)   Date Value   07/20/2019 3.9     SODIUM (mmol/L)   Date  Value   07/20/2019 140     BILIRUBIN TOTAL (mg/dL)   Date Value   04/10/2019 0.3     TOTAL PROTEIN (g/dL)   Date Value   04/10/2019 7.4         THYROID SCREEN TSH REFLEX FT4   Date Value Ref Range Status   07/20/2019 4.710 (H) 0.358 - 3.740 uIU/mL Final     Comment:     **As per the Thyroid Screen protocol, an FT4 test is  automatically ordered for all patients with abnormal TSH  results unless the FT4 has already been performed on this  patient within the last 168 hours.        Cholesterol (mg/dL)   Date Value   04/10/2019 223     LOW DENSITY LIPOPROTEIN DIRECT (mg/dL)   Date Value   04/10/2019 159     HIGH DENSITY LIPOPROTEIN (mg/dL)   Date Value   04/10/2019 43     TRIGLYCERIDES (mg/dL)   Date Value   04/10/2019 169 (H)       HEMOGLOBIN A1C (%)   Date Value   07/20/2019 5.9 (H)     No results found for: POCA1C    RPR QUAL   Date Value Ref Range Status   07/20/2019 Non Reactive Non Reactive Final     Comment:     Performed at:  [facility]  [address] [place], [place]  [id number]  Lab Director: Allyn Kenner MD, Phone:  [phone]       No results found for: HIVAB  No results found for: VAL  No results found for: CLOZ     Pregnancy/Birth Control (for female patients): N/A    CURRENT TREATMENT/CONTACT INFO FOR OTHER AGENCIES AND [place] (if applicable):    Contact/Community Support    No documentation.  COLUMBIARISK ASSESSMENTS:   Suicide:  C-SSRS OP Last Contact Screener - 08/16/19 1542        C-SSRS OP Last Contact Screener    Have you wished you were dead or wished you could go to sleep and not wake up?  No     Have you actually had any thoughts of killing yourself?   No     Have you done anything, started to do anything, or prepared to do anything to end your life?  No     C-SSRS OP Last Contact Screener Risk Score  No Risk         C-SSRS Risk Assessment    No documentation.         VIOLENCE:   Violence/Abuse Risk    No documentation.          MENTAL STATUS  EXAMINATION:  Pleasant; speech-nl rate/tone; attent/conc-fair; mood-"agitated and explosive sometimes"; depression-yes; anxiety-yes; panic-yes; irritability/impatience-yes; anger-yes; anhedonia-no answer; sleep-to bed at 2AM-initial insomnia-sleeps for 3 hours, wakings-for BR, EMA-no, difficulty w/ OOB and facing the day-up at 11AM -!2N; appetite-nl; energy-fair motivation-fair; isol/withdr-no; fatigue-naps; hopelessness-yes; guilt-no; no-SI/HI. Coherent but often tangential and needing constant refocus;  thought process/associations-fairly organized; thought content-no overt psychosis; fund of knowledge-grossly intact; memory-grossly intact; racing thoughts-no; rumination-no; worry-"a lot"; no-AH,VH,CH,OH,TH;paranoia-no;delusions-no; IOR-no; thought broadcasting/insertion/removal-no; A+Ox3. I/J-nl      RISK ASSESSMENTS:     Violence: low (1)     Addiction: low (1)     BIO/PSYCHO/SOCIAL AND RISK FORMULATION(S): 64 y/o [place] female with longstanding MDD, recurrent, GAD, and Panic D/O now in need of psychopharm follow up,and counseling though she has dropped out of same in the past.    DIAGNOSES:    MDD, recurrent, in partial remission; GAD; Panic D/O    PLAN:Continue meds as ordered. Next appt-  Increase Effexor XR to 150 mg QD - #30-2 refills  Trazodone 100 mg HS - refusing to take   Klonopin 1 mg twice daily, PRN - takes twice daily - usually once per day at 55M rather than the Trazodone so when she takes Trazodone and Klonopin she doesn't feel well in the morning.       INFORMED CONSENT - for any new medication:   N/A     CARE PLAN/ EPISODES:  Linked Episodes   Type: Episode: Status: Noted: Resolved: Last update: Updated by:   [place] Active 08/16/2019  08/16/2019  3:39 PM Felix Pacini, RNCS      Comments:       COUNSELING AND COORDINATION OF CARE PROVIDED:   I spent a total of 40 minutes on this visit on the date of service (total time includes all activities performed on  the date of service)      [place], RNCS

## 2020-01-04 MED FILL — VENLAFAXINE 150MG ER: 30 days supply | Qty: 30 | Fill #0

## 2020-01-18 ENCOUNTER — Other Ambulatory Visit (HOSPITAL_BASED_OUTPATIENT_CLINIC_OR_DEPARTMENT_OTHER): Payer: Self-pay | Admitting: "Psychiatric/Mental Health

## 2020-01-18 ENCOUNTER — Encounter (HOSPITAL_BASED_OUTPATIENT_CLINIC_OR_DEPARTMENT_OTHER): Payer: Self-pay | Admitting: "Psychiatric/Mental Health

## 2020-01-18 MED FILL — VENLAFAXINE  37.5MG ER: 30 days supply | Qty: 30 | Fill #4 | Status: CP

## 2020-01-18 MED FILL — VENLAFAXINE 75MG ER: 30 days supply | Qty: 30 | Fill #5 | Status: CP

## 2020-01-18 MED FILL — CHLORTHALID 50MG: 90 days supply | Qty: 90 | Fill #3 | Status: CP

## 2020-01-18 MED FILL — ATENOLOL 50MG: 30 days supply | Qty: 60 | Fill #6 | Status: CP

## 2020-01-18 MED FILL — VALSARTAN 320MG: 30 days supply | Qty: 30 | Fill #2 | Status: CP

## 2020-01-18 NOTE — Telephone Encounter (Signed)
Patient requesting to fill script at [facility], script was sent to [facility]. Please review

## 2020-01-22 NOTE — Telephone Encounter (Signed)
duplicate

## 2020-01-24 ENCOUNTER — Ambulatory Visit (HOSPITAL_BASED_OUTPATIENT_CLINIC_OR_DEPARTMENT_OTHER)
Admission: RE | Admit: 2020-01-24 | Discharge: 2020-01-24 | Disposition: A | Discharge status: Home / Self Care | Payer: No Typology Code available for payment source | Source: Ambulatory Visit

## 2020-01-24 ENCOUNTER — Other Ambulatory Visit: Payer: Self-pay

## 2020-01-24 ENCOUNTER — Ambulatory Visit
Admission: RE | Admit: 2020-01-24 | Discharge: 2020-01-24 | Disposition: A | Discharge status: Home / Self Care | Payer: No Typology Code available for payment source | Attending: Neurology | Admitting: Neurology

## 2020-01-24 DIAGNOSIS — R413 Other amnesia: Secondary | ICD-10-CM

## 2020-01-24 DIAGNOSIS — R9089 Other abnormal findings on diagnostic imaging of central nervous system: Secondary | ICD-10-CM | POA: Diagnosis present

## 2020-01-24 DIAGNOSIS — I6381 Other cerebral infarction due to occlusion or stenosis of small artery: Secondary | ICD-10-CM | POA: Diagnosis present

## 2020-01-24 LAB — COMPREHENSIVE METABOLIC PANEL
ALANINE AMINOTRANSFERASE: 26 U/L (ref 12–45)
ALBUMIN: 3.7 g/dL (ref 3.4–5.0)
ALKALINE PHOSPHATASE: 112 U/L (ref 45–117)
ANION GAP: 6 mmol/L (ref 5–15)
ASPARTATE AMINOTRANSFERASE: 15 U/L (ref 8–34)
BILIRUBIN TOTAL: 0.3 mg/dL (ref 0.2–1.0)
BUN (UREA NITROGEN): 26 mg/dL — ABNORMAL HIGH (ref 7–18)
CALCIUM: 9.5 mg/dL (ref 8.5–10.1)
CARBON DIOXIDE: 29 mmol/L (ref 21–32)
CHLORIDE: 103 mmol/L (ref 98–107)
CREATININE: 1.1 mg/dL (ref 0.4–1.2)
ESTIMATED GLOMERULAR FILT RATE: 53 mL/min — ABNORMAL LOW (ref >60)
Glucose Random: 89 mg/dL (ref 74–160)
POTASSIUM: 4.4 mmol/L (ref 3.5–5.1)
SODIUM: 138 mmol/L (ref 136–145)
TOTAL PROTEIN: 7.6 g/dL (ref 6.4–8.2)

## 2020-01-24 LAB — VITAMIN B12: VITAMIN B12: 311 pg/mL (ref 193–986)

## 2020-01-24 LAB — THYROID SCREEN TSH REFLEX FT4: THYROID SCREEN TSH REFLEX FT4: 5.01 u[IU]/mL — ABNORMAL HIGH (ref 0.358–3.740)

## 2020-01-24 LAB — FREE THYROXINE: FREE THYROXINE: 1.14 ng/dL (ref 0.76–1.46)

## 2020-01-24 LAB — FOLATE: FOLATE: 7.2 ng/mL (ref 3.0–?)

## 2020-01-25 LAB — RPR: RPR QUALITATIVE: NONREACTIVE

## 2020-01-27 NOTE — Progress Notes (Signed)
Reason for consult: Referral for high PTH, and subclinical hypothyroidism      History of present illness: The patient is a 64 year old female [place] speaking, who presents to the endocrine clinic for evaluation of the above. She is accompanied by granddaughter. History was obtained from the patient and review of the electronic medical records.     This visit was completed with the assistance of a phone [place] interpreter.     Patient reports having low energy and depressed mood for some years. She states her weight has been stable.     She denies malabsorption symptoms, denies constipation. Patient denies prior history of bone fractures, hematuria, or kidney stones. Has paresthesias and cramps at the bottom of her soles and constipation.   Gained a lot of weight, "was always skinny". Diet - regular.    She has heat intolerance, diaphoresis, palpitations. She denies height loss. She denies history of poor nutrition. She denies history of an eating disorder. She denies history of radiation treatment. She denies plan for dental procedures in the coming year. Denies/Noticed depressed mood. Denies itchy skin. Denies family history of parathyroid disease.    Was schedule last year for DXA given age, but test was not done. She is not sure what that test would be.  Not taking calcium or vitramin D supplements. She is on chlorthalidone for HTN.      Most Recent Weight Reading(s)  08/18/19 : 84.7 kg (186 lb 11.7 oz)  07/20/19 : 85.3 kg (188 lb)  04/10/19 : 85.7 kg (189 lb)  08/22/18 : 83.5 kg (184 lb)  06/29/18 : 81.2 kg (179 lb)        Most Recent Height Reading(s)  11/06/19 : 4' 11.6" (1.514 m)  07/20/19 : 4\' 11"  (1.499 m)  04/10/19 : 4' 11.66" (1.515 m)  08/22/18 : 4' 10.66" (1.49 m)  06/29/18 : 4' 11.11" (1.501 m)        Exercise: household activities    Menopause: age 57    Diet: regular    Supplements:     The patient is taking the following:  Proton pump inhibitor: no  Aromatase inhibitor: no  Corticosteroid:  no  Lasix: no  HCTZ: - on Chlorthalidone    Problem History:  Patient Active Problem List:     Essential hypertension     Labyrinthitis     Panic disorder     Hyperlipidemia     Chronic cough     Mild episode of recurrent major depressive disorder (HCC)     Allergic rhinitis     Need for prophylactic vaccination and inoculation against influenza     Left arm pain     Stage 3a chronic kidney disease     Decreased GFR     Anxiety state     Palpitations     Psychophysiological insomnia     COVID-19      Past Medical History:   Past Medical History:  09/11/2018: Chronic kidney disease, stage III (moderate) (HCC)  No date: HTN (hypertension)  01/05/2018: Mild episode of recurrent major depressive disorder (HCC)  11/29/2017: Panic disorder  No date: Wears eyeglasses    Active Medication:   venlafaxine (EFFEXOR-XR) 150 MG 24 hr capsule, D/C previous orders. Start 1 capsule AM, Disp: 30 capsule, Rfl: 2  [DISCONTINUED] clonazePAM (KLONOPIN) 1 MG tablet, 1 tablet twice daily as needed, Disp: 60 tablet, Rfl: 2  traZODone (DESYREL) 100 MG tablet, One tab po nightly, Disp: 90 tablet, Rfl: 2  fluticasone (FLONASE) 50 MCG/ACT nasal spray, 1 spray by Each Nostril route daily, Disp: 1 Bottle, Rfl: 5  valsartan (DIOVAN) 320 MG tablet, Take 1 tablet by mouth daily, Disp: 30 tablet, Rfl: 11  chlorthalidone (HYGROTEN) 50 MG tablet, Take 1 tablet by mouth daily, Disp: 90 tablet, Rfl: 3  atenolol (TENORMIN) 50 MG tablet, One tab in am and one tab in pm, Disp: 60 tablet, Rfl: 6  Multiple Vitamins-Minerals (HAIR SKIN AND NAILS FORMULA) TABS, Take 1 capsule by mouth daily, Disp: 30 tablet, Rfl: 12    No current facility-administered medications on file prior to visit.    Allergies:   Review of Patient's Allergies indicates:   Pollen extract          Cough    Social history:  Social History     Socioeconomic History    Marital status: Married     Spouse name: Not on file    Number of children: Not on file    Years of education: Not on file     Highest education level: Not on file   Occupational History    Not on file   Tobacco Use    Smoking status: Former Smoker     Packs/day: 2.00     Years: 25.00     Pack years: 50.00     Quit date: 10/27/1977     Years since quitting: 42.2    Smokeless tobacco: Never Used   Substance and Sexual Activity    Alcohol use: Not Currently    Drug use: Never    Sexual activity: Yes     Partners: Male   Other Topics Concern    Not on file   Social History Narrative    Originally from [place], [place]    History of childhood trauma    Lives with family   Social Determinants of Health  Financial Resource Strain:     Difficulty of Paying Living Expenses:   Food Insecurity:     Worried About Programme researcher, broadcasting/film/video in the Last Year:     Barista in the Last Year:   Transportation Needs:     Freight forwarder (Medical):     Lack of Transportation (Non-Medical):   Physical Activity:     Days of Exercise per Week:     Minutes of Exercise per Session:   Stress:     Feeling of Stress :   Social Connections:     Frequency of Communication with Friends and Family:     Frequency of Social Gatherings with Friends and Family:     Attends Religious Services:     Active Member of Clubs or Organizations:     Attends Engineer, structural:     Marital Status:   Intimate Partner Violence:     Fear of Current or Ex-Partner:     Emotionally Abused:     Physically Abused:     Sexually Abused:       Family History:   No family history on file.     No history of MEN syndrome, hyperparathyroidism, hypercalcemia, hypercalciuria, kidney stones, thyroid dysfunction.      Review of systems:  General: as per HPI  HEENT: No pain or irritation in the eyes, change in vision, or sore throat  NECK: No anterior neck pain, swelling, hoarseness, or dysphagia  Cv: No chest pain, rest as per HPI  PULM: No SOB, DOE, wheezing, or cough  ABD: No abdominal pain, see  HPI  MSK: See HPI  GU: Denies polyuria or  dysuria.   NEURO: No dizziness, or headache  PSYCH: No change in mood, depression, anxiety  Rest of review of systems are negative except as stated in HPI.     Physical Exam:   BP 161/85    Pulse 77    Temp 96.2 F (35.7 C) (Temporal)    Ht 4' 10.66" (1.49 m)    Wt 84.4 kg (186 lb)    SpO2 97%    BMI 38.00 kg/m    GEN: Appears well. Somewhat Cushingoid.   PSYCH: normal affect. Alert and oriented.  HEENT: EOMI, no proptosis, lid lag, or periorbital edema. Conjunctiva anicteric.  NECK: Thyroid is normal size, nontender, no bruit. There are no palpable nodules. No cervical or supraclavicular lymphadenopathy.   CV: normal S1 and S2, no murmurs. No extremity edema. Pulses 2+ distally  PULM: Clear to auscultation bilaterally. No wheezing.  ABD: soft, nontender, nondistended, positive bowel sounds, no masses palpable, no organomegaly.  MSK: Back is nontender to palpation. There is no tenderness along long bones. Feet are without ulcer.   NEURO: Normal affect. No focal weakness. Reflexes are 2+ with normal relaxation phase. No tremor.  SKIN: No acanthosis nigricans, no striae, no hirsutism.     venlafaxine (EFFEXOR-XR) 150 MG 24 hr capsule, D/C previous orders. Start 1 capsule AM, Disp: 30 capsule, Rfl: 2  clonazePAM (KLONOPIN) 1 MG tablet, 1 tablet twice daily as needed, Disp: 60 tablet, Rfl: 2  traZODone (DESYREL) 100 MG tablet, One tab po nightly, Disp: 90 tablet, Rfl: 2  fluticasone (FLONASE) 50 MCG/ACT nasal spray, 1 spray by Each Nostril route daily, Disp: 1 Bottle, Rfl: 5  valsartan (DIOVAN) 320 MG tablet, Take 1 tablet by mouth daily, Disp: 30 tablet, Rfl: 11  chlorthalidone (HYGROTEN) 50 MG tablet, Take 1 tablet by mouth daily, Disp: 90 tablet, Rfl: 3  atenolol (TENORMIN) 50 MG tablet, One tab in am and one tab in pm, Disp: 60 tablet, Rfl: 6  Multiple Vitamins-Minerals (HAIR SKIN AND NAILS FORMULA) TABS, Take 1 capsule by mouth daily, Disp: 30 tablet, Rfl: 12    No current facility-administered medications on  file prior to visit.      Laboratory:  ALBUMIN (g/dL)   Date Value   16/10/960408/09/2019 3.7     ALKALINE PHOSPHATASE (U/L)   Date Value   01/24/2020 112     ALANINE AMINOTRANSFERASE (U/L)   Date Value   01/24/2020 26     ASPARTATE AMINOTRANSFERASE (U/L)   Date Value   01/24/2020 15     No results found for: GLUCOSEF  BUN (UREA NITROGEN) (mg/dL)   Date Value   54/09/811908/09/2019 26 (H)     CALCIUM (mg/dL)   Date Value   14/78/295608/09/2019 9.5     CHLORIDE (mmol/L)   Date Value   01/24/2020 103     CARBON DIOXIDE (mmol/L)   Date Value   01/24/2020 29     CREATININE (mg/dL)   Date Value   21/30/865708/09/2019 1.1     POTASSIUM (mmol/L)   Date Value   01/24/2020 4.4     SODIUM (mmol/L)   Date Value   01/24/2020 138     BILIRUBIN TOTAL (mg/dL)   Date Value   84/69/629508/09/2019 0.3     TOTAL PROTEIN (g/dL)   Date Value   28/41/324408/09/2019 7.6       No results found for: TSH      HEMOGLOBIN A1C (%)  Date Value   07/20/2019 5.9 (H)   04/10/2019 6.0 (H)   08/22/2018 5.8 (H)       No results found for: POCA1C    VITAMIN D,25 HYDROXY (ng/mL)   Date Value   07/20/2019 19 (*L)       PTH INTACT (pg/mL)   Date Value   07/20/2019 155.4 (H)       CALCIUM (mg/dL)   Date Value   00/93/8182 9.5   11/13/2019 9.3   07/20/2019 9.0     THYROID SCREEN TSH REFLEX FT4 (uIU/mL)   Date Value   01/24/2020 5.010 (H)   07/20/2019 4.710 (H)   04/10/2019 5.630 (H)     FREE THYROXINE (ng/dL)   Date Value   99/37/1696 1.14   07/20/2019 1.07   04/10/2019 1.13           Imaging: DXA - scheduled - not done yet.      Assessment/plan:   1. The patient is a 64 year old female who presents for evaluation of hyperparathyroidism. Most likely secondary to vitamin D deficiency.  Of mention the patient is on chlorthalidone that can lead to calcium retention, but she was never hypercalcemic.  The fact that she is still normocalcemic is very reassuring and an argument against primary hyperparathyroidism.    In the differential diagnosis we considered very mild primary hyperparathyroidism but that is less likely  in my opinion.    Plan: correct vitamin D more aggressively - with 4000 units of vitamin D3 for 6 weeks, then recheck labs.  Add calcium supplements calcium tablets 600 mg p.o. twice daily.  I have reordered a bone density today. We'll follow on the report.      2. Subclinical hypothyroidism. Would monitor yearly and treat only if progressing into overt hypothyroidism (low FT4, usually TSH>8-10).  In the older age group there seems to be a survival benefit of having TSH in subclinical hypothyroidism range.  At this time I do not think treating would with levothyroxine would be of any clinical benefit.  TSH with reflex free T3-T4 yearly is indicated    3. Obesity, class II BMI 38. Discussed lowering carb intake, as she has prediabetes.  discussed potential benefits of metformin in Mr/Ms [place] situation, such as: decreased appetite, benefits in the pathogenesis of fatty liver, potential anticancer, antidiabetes benefits. Reviewed in detail side effects -  rashes, GI issues such as bloating, diarhhea and cramps. Presribed today at dose of 500 mg po BID.          I discussed the assessment and plan with the family member present today.     All questions were answered and patient agrees with plan.     Follow up in 3 months for conclusion regarding the high PTH levels.    Thank you for referring your patient to our endocrine clinic for evaluation. Please do not hesitate to contact me with any questions.     Luvenia Redden, MD

## 2020-01-30 ENCOUNTER — Encounter (HOSPITAL_BASED_OUTPATIENT_CLINIC_OR_DEPARTMENT_OTHER): Payer: Self-pay | Admitting: Neurology

## 2020-01-30 LAB — METHYLMALONIC ACID: METHYLMALONIC ACID: 107 nmol/L (ref 0–378)

## 2020-01-31 ENCOUNTER — Other Ambulatory Visit: Payer: Self-pay

## 2020-01-31 ENCOUNTER — Ambulatory Visit: Payer: No Typology Code available for payment source

## 2020-01-31 ENCOUNTER — Ambulatory Visit: Payer: No Typology Code available for payment source | Attending: Psychiatry | Admitting: "Psychiatric/Mental Health

## 2020-01-31 VITALS — BP 161/85 | HR 77 | Temp 96.2°F | Ht 58.66 in | Wt 186.0 lb

## 2020-01-31 DIAGNOSIS — F3341 Major depressive disorder, recurrent, in partial remission: Secondary | ICD-10-CM | POA: Insufficient documentation

## 2020-01-31 DIAGNOSIS — N2581 Secondary hyperparathyroidism of renal origin: Secondary | ICD-10-CM | POA: Diagnosis present

## 2020-01-31 DIAGNOSIS — R7303 Prediabetes: Secondary | ICD-10-CM | POA: Diagnosis present

## 2020-01-31 DIAGNOSIS — E559 Vitamin D deficiency, unspecified: Secondary | ICD-10-CM

## 2020-01-31 MED ORDER — CLONAZEPAM 1 MG PO TABS
ORAL_TABLET | ORAL | 2 refills | Status: DC
Start: 2020-01-31 — End: 2020-05-23

## 2020-01-31 MED ORDER — METFORMIN HCL 500 MG PO TABS
500.0000 mg | ORAL_TABLET | Freq: Two times a day (BID) | ORAL | 5 refills | Status: DC
Start: 2020-01-31 — End: 2020-09-10

## 2020-01-31 MED ORDER — CALCIUM CARBONATE 500 MG PO CHEW
1.0000 | CHEWABLE_TABLET | Freq: Two times a day (BID) | ORAL | 2 refills | Status: AC
Start: 2020-01-31 — End: 2020-04-30

## 2020-01-31 MED ORDER — VITAMIN D 50 MCG (2000 UT) PO TABS
1.0000 | ORAL_TABLET | Freq: Two times a day (BID) | ORAL | 0 refills | Status: DC
Start: 2020-01-31 — End: 2020-02-29

## 2020-01-31 MED FILL — VENLAFAXINE 150MG ER: 30 days supply | Qty: 30 | Fill #0 | Status: CP

## 2020-01-31 MED FILL — VITAMIN D 50MCG (2000U): 90 days supply | Qty: 180 | Fill #0 | Status: CP

## 2020-01-31 MED FILL — CAL-GEST CHW 500MG: 30 days supply | Qty: 60 | Fill #0 | Status: CP

## 2020-01-31 MED FILL — METFORMIN 500MG: 30 days supply | Qty: 60 | Fill #0 | Status: CP

## 2020-01-31 MED FILL — CLONAZEPAM 1MG: 30 days supply | Qty: 60 | Fill #0

## 2020-01-31 NOTE — Progress Notes (Signed)
Patient states they are safe at home  sc

## 2020-01-31 NOTE — Progress Notes (Signed)
ADULT OUTPATIENT PSYCHIATRY (OPD)  PSYCHOPHARM FOLLOW UP    LANGUAGE OF CARE:    [place]      LANGUAGE NEEDS MET:    Telephone Interpreter    CHIEF COMPLAINT:     SUBJECTIVE DATA/INTERVAL HISTORY:   Sara Snyder is assessed in a televisit with a [place] interpreter.  She did not answer the first two phone calls as she was at an Endocrinology appt.  "I have been feeling agitated lately. I'm sweaty."  She says she has no Klonopin; discuss that the prescription is current.    She is fully vaccinated.  She reports some anxiety, depression, irritability, anger, and worry. She denies hopelessness, guilt, racing thoughts, rumination, or SI. Energy, motivation, and appetit, sleep are fair-normal. She is not hypomanic or overtly psychotic.Marland Kitchen     OBJECTIVE DATA:   CURRENT MEDICATIONS:    Current Outpatient Medications   Medication Sig    traZODone (DESYREL) 50 MG tablet 1/2 - 1 tablet HS, PRN    fluticasone (FLONASE) 50 MCG/ACT nasal spray 1 spray by Each Nostril route daily    benzonatate (TESSALON) 100 MG capsule Take 1 capsule by mouth 3 (three) times daily as needed for Cough  for up to 20 days    venlafaxine (EFFEXOR-XR) 37.5 MG 24 hr capsule 1 tablet AM along with 75 mg-total = 112.5 mg    venlafaxine (EFFEXOR-XR) 75 MG 24 hr capsule Take 1 capsule by mouth daily Take along with 37.5 mg. TDD=112.5 mg.    valsartan (DIOVAN) 320 MG tablet Take 1 tablet by mouth daily    clonazePAM (KLONOPIN) 1 MG tablet 1 tablet twice daily as needed    chlorthalidone (HYGROTEN) 50 MG tablet Take 1 tablet by mouth daily    atenolol (TENORMIN) 50 MG tablet One tab in am and one tab in pm    omeprazole (PRILOSEC) 20 MG capsule Take 1 capsule by mouth daily    meclizine (ANTIVERT) 25 MG TABS Take 1 tablet by mouth every 8 (eight) hours as needed    Multiple Vitamins-Minerals (HAIR SKIN AND NAILS FORMULA) TABS Take 1 capsule by mouth daily    loratadine (CLARITIN) 10 MG tablet Take 1 tablet by mouth daily     calcium-vitamin D (OSCAL-500) 500-400 MG-UNIT per tablet Take 1 tablet by mouth 2 (two) times daily    azelastine (ASTELIN) 0.1 % nasal spray 1 spray by Each Nostril route 2 (two) times daily Use in each nostril as directed    acetaminophen (TYLENOL) 500 MG tablet Take 1 tablet by mouth every 6 (six) hours as needed for Pain     No current facility-administered medications for this visit.           VITAL SIGNS:   There were no vitals filed for this visit.     LABS:    WHITE BLOOD CELL COUNT (TH/uL)   Date Value   04/10/2019 5.8     RED BLOOD CELL COUNT (M/uL)   Date Value   04/10/2019 4.14     HEMOGLOBIN (g/dL)   Date Value   04/10/2019 12.2     No results found for: IDEALHCT  HEMATOCRIT (%)   Date Value   04/10/2019 37.5     No results found for: HCTDIFF  MEAN CORPUSCULAR VOL (fl)   Date Value   04/10/2019 90.6     MEAN CORPUSCULAR HGB (pg)   Date Value   04/10/2019 29.5     MEAN CORP HGB CONC (g/dL)   Date Value  04/10/2019 32.5     No results found for: RDW  PLATELET COUNT (TH/uL)   Date Value   04/10/2019 259     MEAN PLATELET VOLUME (fL)   Date Value   04/10/2019 10.7     NEUTROPHIL % (%)   Date Value   04/10/2019 56.9     LYMPHOCYTE % (%)   Date Value   04/10/2019 34.9     MONOCYTE % (%)   Date Value   04/10/2019 5.3     EOSINOPHIL % (%)   Date Value   04/10/2019 2.1     BASOPHIL % (%)   Date Value   04/10/2019 0.5           ALBUMIN (g/dL)   Date Value   04/10/2019 3.8     ALKALINE PHOSPHATASE (U/L)   Date Value   04/10/2019 117     ALANINE AMINOTRANSFERASE (U/L)   Date Value   04/10/2019 30     ASPARTATE AMINOTRANSFERASE (U/L)   Date Value   04/10/2019 21     Glucose Random (mg/dL)   Date Value   07/20/2019 99     BUN (UREA NITROGEN) (mg/dL)   Date Value   07/20/2019 17     CALCIUM (mg/dL)   Date Value   07/20/2019 9.0     CHLORIDE (mmol/L)   Date Value   07/20/2019 104     CARBON DIOXIDE (mmol/L)   Date Value   07/20/2019 30     CREATININE (mg/dL)   Date Value   07/20/2019 1.3 (H)     POTASSIUM (mmol/L)    Date Value   07/20/2019 3.9     SODIUM (mmol/L)   Date Value   07/20/2019 140     BILIRUBIN TOTAL (mg/dL)   Date Value   04/10/2019 0.3     TOTAL PROTEIN (g/dL)   Date Value   04/10/2019 7.4         THYROID SCREEN TSH REFLEX FT4   Date Value Ref Range Status   07/20/2019 4.710 (H) 0.358 - 3.740 uIU/mL Final     Comment:     **As per the Thyroid Screen protocol, an FT4 test is  automatically ordered for all patients with abnormal TSH  results unless the FT4 has already been performed on this  patient within the last 168 hours.        Cholesterol (mg/dL)   Date Value   04/10/2019 223     LOW DENSITY LIPOPROTEIN DIRECT (mg/dL)   Date Value   04/10/2019 159     HIGH DENSITY LIPOPROTEIN (mg/dL)   Date Value   04/10/2019 43     TRIGLYCERIDES (mg/dL)   Date Value   04/10/2019 169 (H)       HEMOGLOBIN A1C (%)   Date Value   07/20/2019 5.9 (H)     No results found for: POCA1C    RPR QUAL   Date Value Ref Range Status   07/20/2019 Non Reactive Non Reactive Final     Comment:     Performed at:  [facility]  [address], [place], [place]  [id number]  Lab Director: Allyn Kenner MD, Phone:  [phone]       No results found for: HIVAB  No results found for: VAL  No results found for: CLOZ     Pregnancy/Birth Control (for female patients): N/A    CURRENT TREATMENT/CONTACT [place] (if applicable):  Contact/Community Support    No documentation.         COLUMBIARISK ASSESSMENTS:   Suicide:  C-SSRS OP Last Contact Screener - 08/16/19 1542        C-SSRS OP Last Contact Screener    Have you wished you were dead or wished you could go to sleep and not wake up?  No     Have you actually had any thoughts of killing yourself?   No     Have you done anything, started to do anything, or prepared to do anything to end your life?  No     C-SSRS OP Last Contact Screener Risk Score  No Risk         C-SSRS Risk Assessment    No documentation.         VIOLENCE:   Violence/Abuse Risk     No documentation.          MENTAL STATUS EXAMINATION:  Pleasant; speech-nl rate/tone; attent/conc-fair; mood- depression-yes; anxiety-yes; panic-no; irritability/impatience-"a lot"; anger-no; anhedonia-no; sleep-to bed at 12AM-initial insomnia-no, wakings-for BR, EMA-no, difficulty w/ OOB and facing the day-up at 9-10 AM; appetite-nl; energy-nl motivation-fair-nl; isol/withdr-no; fatigue-naps; hopelessness-no; guilt-no; no-SI/HI. Coherent but often tangential and needing constant refocus;thought process/associations-fairly organized; thought content-no overt psychosis; fund of knowledge-grossly intact; memory-grossly intact; racing thoughts-no; rumination-no; worry-"a lot"; no-AH,VH,CH,OH,TH;paranoia-no;delusions-no; IOR-no; thought broadcasting/insertion/removal-no; A+Ox3. I/J-nl      RISK ASSESSMENTS:     Violence: low (1)     Addiction: low (1)     BIO/PSYCHO/SOCIAL AND RISK FORMULATION(S): 64 y/o [place] female with longstanding MDD, recurrent, GAD, and Panic D/O now in need of psychopharm follow up,and counseling though she has dropped out of same in the past.    DIAGNOSES:    MDD, recurrent, in partial remission; GAD; Panic D/O    PLAN:Continue meds as ordered. Next appt-  Effexor XR 150 mg QD   Trazodone 100 mg HS - refusing to take   Klonopin 1 mg twice daily, PRN - takes twice daily - usually once per day at 33M rather than the Trazodone so when she takes Trazodone and Klonopin she doesn't feel well in the morning. Discuss taking either Trazodone or Klonopin but not both at night..      INFORMED CONSENT - for any new medication:   N/A     CARE PLAN/ EPISODES:  Linked Episodes   Type: Episode: Status: Noted: Resolved: Last update: Updated by:   [place] Active 08/16/2019  08/16/2019  3:39 PM Felix Pacini, RNCS      Comments:       COUNSELING AND COORDINATION OF CARE PROVIDED:   I spent a total of 40 minutes on this visit on the date of service (total time includes all  activities performed on the date of service)      [place], RNCS

## 2020-02-06 LAB — RBC SEDIMENTATION RATE: RBC SEDIMENTATION RATE: 19 MM/HR (ref 0–30)

## 2020-02-13 ENCOUNTER — Encounter (HOSPITAL_BASED_OUTPATIENT_CLINIC_OR_DEPARTMENT_OTHER): Payer: Self-pay | Admitting: Neurology

## 2020-02-13 ENCOUNTER — Encounter (HOSPITAL_BASED_OUTPATIENT_CLINIC_OR_DEPARTMENT_OTHER): Payer: Self-pay | Admitting: "Psychiatric/Mental Health

## 2020-02-19 ENCOUNTER — Other Ambulatory Visit (HOSPITAL_BASED_OUTPATIENT_CLINIC_OR_DEPARTMENT_OTHER): Payer: Self-pay | Admitting: Internal Medicine

## 2020-02-19 DIAGNOSIS — I10 Essential (primary) hypertension: Secondary | ICD-10-CM

## 2020-02-19 MED FILL — VALSARTAN 320MG: 30 days supply | Qty: 30 | Fill #3

## 2020-02-19 MED FILL — TRAZODONE 100MG: 90 days supply | Qty: 90 | Fill #0

## 2020-02-20 ENCOUNTER — Other Ambulatory Visit: Payer: Self-pay

## 2020-02-20 ENCOUNTER — Ambulatory Visit: Payer: No Typology Code available for payment source | Attending: Neurology | Admitting: Neurology

## 2020-02-20 DIAGNOSIS — R413 Other amnesia: Secondary | ICD-10-CM | POA: Diagnosis present

## 2020-02-20 MED FILL — ATENOLOL 50MG: 30 days supply | Qty: 60 | Fill #0

## 2020-02-20 NOTE — Progress Notes (Signed)
NEUROLOGY FOLLOW-UP NOTE    Today I saw the patient in follow-up.  I had last seen the patient on 12-26-2019.  My Formulation on that day was as follows:  "Memory difficulties arising in the context of severe depression (crying every day for the last 3, 4 years).  DDx includes depressive pseudodementia, genuine dementia (Alzheimer's or other), minimal cognitive impairment, etc.     Pt would like to f/u in person with psychiatric nurse Felix Pacini, and I have inquired of Ms. Lonia Farber if that would be possible.  Also, I have ordered MRI of the brain, CMP, TSH, vitamin B12, MMA, folate, ESR, and RPR.    I will see the patient in f/u after the above have been completed."      In the interim, Pt saw Felix Pacini twice (01-03-2020 and 01-31-2020).  She increased the Effexor XR to 150 mg po q day, and patient's mood has improved a bit.  Memory problems persist.    Meds: losartan, Effexor xr 150 mg po q day, chlorthaladone, clonazepam, and atenolol.    Results for Sara Snyder, Sara Snyder (MRN [id number]) as of 02/20/2020 11:28   Ref. Range 01/24/2020 11:37   SODIUM Latest Ref Range: 136 - 145 mmol/L 138   POTASSIUM Latest Ref Range: 3.5 - 5.1 mmol/L 4.4   CHLORIDE Latest Ref Range: 98 - 107 mmol/L 103   CARBON DIOXIDE Latest Ref Range: 21 - 32 mmol/L 29   ANION GAP Latest Ref Range: 5 - 15 mmol/L 6   CALCIUM Latest Ref Range: 8.5 - 10.1 mg/dL 9.5   Glucose Random Latest Ref Range: 74 - 160 mg/dL 89   BUN (UREA NITROGEN) Latest Ref Range: 7 - 18 mg/dL 26 (H)   CREATININE Latest Ref Range: 0.4 - 1.2 mg/dL 1.1   ESTIMATED GLOMERULAR FILT RATE Latest Ref Range: >60 ML/MIN 53 (L)   TOTAL PROTEIN Latest Ref Range: 6.4 - 8.2 g/dL 7.6   ALBUMIN Latest Ref Range: 3.4 - 5.0 g/dL 3.7   BILIRUBIN TOTAL Latest Ref Range: 0.2 - 1.0 mg/dL 0.3   ALKALINE PHOSPHATASE Latest Ref Range: 45 - 117 U/L 112   ASPARTATE AMINOTRANSFERASE Latest Ref Range: 8 - 34 U/L 15   ALANINE AMINOTRANSFERASE Latest Ref Range: 12 - 45 U/L 26   FOLATE Latest Ref Range:  3.0- ng/mL 7.2   FREE THYROXINE Latest Ref Range: 0.76 - 1.46 ng/dL 1.14   THYROID SCREEN TSH REFLEX FT4 Latest Ref Range: 0.358 - 3.740 uIU/mL 5.010 (H)   VITAMIN B12 Latest Ref Range: 193 - 986 pg/mL 311   METHYLMALONIC ACID Latest Ref Range: 0 - 378 nmol/L 107   METHYLMALONIC ACID DISCLAIMER Latest Ref Range: .  Comment   RPR QUALITATIVE Latest Ref Range: NONREACTIVE  NON-REACTIVE   RBC SEDIMENTATION RATE Latest Ref Range: 0 - 30 MM/HR 19       Brain MRI without contrast (01-24-2020)    CLINICAL INDICATION: memory difficulties     COMPARISON: None.     TECHNIQUE: Brain MRI performed using sagittal T1 weighted, axial T1, T2, FLAIR, gradient-echo and diffusion sequences.     FINDINGS:     IMPRESSION:     Brain: There is no hemorrhage, acute infarct, or mass. There are moderate FLAIR hyperintense foci in the periventricular/subcortical white matter. Mild patchy FLAIR hyperintensity in descending white matter tracts in brainstem. There is a small chronic white matter lacunar infarct adjacent to the posterior right lateral ventricle. 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  Ventricles and CSF spaces: Unremarkable     Orbits: Unremarkable     Paranasal sinuses and mastoids: Well aerated     Vasculature: Flow voids of major vessels the base of brain are present.     IMPRESSION:   1. No acute intracerebral pathology.   2. White matter signal abnormalities, nonspecific, likely sequela of chronic small vessel ischemic changes. Small chronic white matter lacunar infarct.         FORMULATION:    I suspect that patient's memory difficulties are due to her depression (i.e., depressive pseudodementia).  Plan is that she will continue to see Felix Pacini for rx of depression.  I will see patient in f/u in 2, 3 months.    Cyndi Bender, M.D.  Neurologist      Addendum: Twenty-five (25) minutes were spent today pertaining to this visit.

## 2020-02-20 NOTE — Telephone Encounter (Signed)
PER Pharmacy, Sara Snyder is a 64 year old female has requested a refill of atenolol (TENORMIN) 50 MG tablet      Last tel. Visit: 12/26/2019 with Sandi Raveling  Last Physical Exam:N/A    FECAL OCCULT BLOOD AGE 18+ due on 12/03/2018    Other Med Adult:  Most Recent BP Reading(s)  01/31/20 : 161/85        Cholesterol (mg/dL)   Date Value   44/46/1901 223     LOW DENSITY LIPOPROTEIN DIRECT (mg/dL)   Date Value   22/24/1146 159     HIGH DENSITY LIPOPROTEIN (mg/dL)   Date Value   43/14/2767 43     TRIGLYCERIDES (mg/dL)   Date Value   07/02/347 169 (H)         THYROID SCREEN TSH REFLEX FT4 (uIU/mL)   Date Value   01/24/2020 5.010 (H)         No results found for: TSH    HEMOGLOBIN A1C (%)   Date Value   07/20/2019 5.9 (H)       No results found for: POCA1C      No results found for: INR    SODIUM (mmol/L)   Date Value   01/24/2020 138       POTASSIUM (mmol/L)   Date Value   01/24/2020 4.4           CREATININE (mg/dL)   Date Value   61/16/4353 1.1       Documented patient preferred pharmacies:    [facility] [place], [place] - [address]  Phone: [phone] Fax: [phone]

## 2020-02-20 NOTE — Telephone Encounter (Signed)
contacted the pharmacy to transfer med from [facility] pharmacy

## 2020-02-22 ENCOUNTER — Encounter (HOSPITAL_BASED_OUTPATIENT_CLINIC_OR_DEPARTMENT_OTHER): Payer: Self-pay

## 2020-02-22 ENCOUNTER — Encounter (HOSPITAL_BASED_OUTPATIENT_CLINIC_OR_DEPARTMENT_OTHER): Payer: Self-pay | Admitting: Family Medicine

## 2020-02-22 ENCOUNTER — Encounter (HOSPITAL_BASED_OUTPATIENT_CLINIC_OR_DEPARTMENT_OTHER): Payer: Self-pay | Admitting: Internal Medicine

## 2020-02-22 DIAGNOSIS — I10 Essential (primary) hypertension: Secondary | ICD-10-CM

## 2020-02-22 NOTE — Telephone Encounter (Signed)
[facility] confirmed that atenolol has active refills remaining. No refill is required at this time.

## 2020-02-22 NOTE — Telephone Encounter (Signed)
[facility] confirmed that chlorthalidone  has active refills remaining. No refill is required at this time.

## 2020-02-29 ENCOUNTER — Other Ambulatory Visit (HOSPITAL_BASED_OUTPATIENT_CLINIC_OR_DEPARTMENT_OTHER): Payer: Self-pay

## 2020-02-29 DIAGNOSIS — N2581 Secondary hyperparathyroidism of renal origin: Secondary | ICD-10-CM

## 2020-02-29 DIAGNOSIS — E559 Vitamin D deficiency, unspecified: Secondary | ICD-10-CM

## 2020-02-29 MED ORDER — VITAMIN D 50 MCG (2000 UT) PO TABS
1.0000 | ORAL_TABLET | Freq: Two times a day (BID) | ORAL | 0 refills | Status: DC
Start: 2020-02-29 — End: 2020-06-17

## 2020-02-29 MED FILL — CLONAZEPAM 1MG: 30 days supply | Qty: 60 | Fill #0

## 2020-02-29 MED FILL — ATENOLOL 50MG: 30 days supply | Qty: 60 | Fill #0

## 2020-02-29 MED FILL — VENLAFAXINE 150MG ER: 30 days supply | Qty: 30 | Fill #1

## 2020-02-29 NOTE — Telephone Encounter (Signed)
PER Pharmacy, Sara Snyder is a 64 year old female has requested a refill of      -  cholecalciferol (VITAMIN D3) 2000 UNIT        Last Office Visit: 02/20/2020 with Leonie Green, L  Last Physical Exam: N/A     FECAL OCCULT BLOOD AGE 75+ due on 12/03/2018     Other Med Adult:  Most Recent BP Reading(s)  01/31/20 : 161/85        Cholesterol (mg/dL)   Date Value   85/92/7639 223     LOW DENSITY LIPOPROTEIN DIRECT (mg/dL)   Date Value   43/20/0379 159     HIGH DENSITY LIPOPROTEIN (mg/dL)   Date Value   44/46/1901 43     TRIGLYCERIDES (mg/dL)   Date Value   22/24/1146 169 (H)         THYROID SCREEN TSH REFLEX FT4 (uIU/mL)   Date Value   01/24/2020 5.010 (H)         No results found for: TSH    HEMOGLOBIN A1C (%)   Date Value   07/20/2019 5.9 (H)       No results found for: POCA1C      No results found for: INR    SODIUM (mmol/L)   Date Value   01/24/2020 138       POTASSIUM (mmol/L)   Date Value   01/24/2020 4.4           CREATININE (mg/dL)   Date Value   43/14/2767 1.1        Documented patient preferred pharmacies:    [facility] [place], [place] - [address]  Phone: [phone] Fax: [phone]

## 2020-02-29 NOTE — Telephone Encounter (Signed)
-----   Message from Butler Denmark sent at 02/29/2020  1:18 PM EDT -----  Regarding: refill request  Prior authorization request for:  Vitamin D 50 mcg (2000U)  Clinical Indication: Take 1 tablet by mouth 2 (two) times daily    Pharmacy:      [facility] [address] [place] [address]  Tel:  [phone]   Fax:  [phone]

## 2020-03-06 ENCOUNTER — Other Ambulatory Visit (HOSPITAL_BASED_OUTPATIENT_CLINIC_OR_DEPARTMENT_OTHER): Payer: Self-pay | Admitting: "Psychiatric/Mental Health

## 2020-03-06 ENCOUNTER — Ambulatory Visit (HOSPITAL_BASED_OUTPATIENT_CLINIC_OR_DEPARTMENT_OTHER): Payer: No Typology Code available for payment source | Admitting: "Psychiatric/Mental Health

## 2020-03-06 NOTE — Progress Notes (Signed)
Psychopharm appt. Called twice, no answer, left vmail to reschedule.Marland Kitchen

## 2020-03-20 MED FILL — VALSARTAN 320MG: 30 days supply | Qty: 30 | Fill #4 | Status: CP

## 2020-04-04 MED FILL — VENLAFAXINE 150MG ER: 30 days supply | Qty: 30 | Fill #2

## 2020-04-04 MED FILL — CHLORTHALID 50MG: 90 days supply | Qty: 90 | Fill #0 | Status: CP

## 2020-04-04 MED FILL — ATENOLOL 50MG: 30 days supply | Qty: 60 | Fill #1

## 2020-04-04 MED FILL — CLONAZEPAM 1MG: 30 days supply | Qty: 60 | Fill #1

## 2020-04-19 MED FILL — VALSARTAN 320MG: 30 days supply | Qty: 30 | Fill #5

## 2020-04-30 ENCOUNTER — Encounter (HOSPITAL_BASED_OUTPATIENT_CLINIC_OR_DEPARTMENT_OTHER): Payer: Self-pay | Admitting: "Psychiatric/Mental Health

## 2020-04-30 ENCOUNTER — Other Ambulatory Visit (HOSPITAL_BASED_OUTPATIENT_CLINIC_OR_DEPARTMENT_OTHER): Payer: Self-pay | Admitting: "Psychiatric/Mental Health

## 2020-04-30 MED ORDER — VENLAFAXINE HCL ER 150 MG PO CP24
ORAL_CAPSULE | ORAL | 0 refills | Status: DC
Start: 2020-04-30 — End: 2020-06-04

## 2020-04-30 MED FILL — ATENOLOL 50MG: 30 days supply | Qty: 60 | Fill #2

## 2020-04-30 MED FILL — VENLAFAXINE 150MG ER: 30 days supply | Qty: 30 | Fill #0

## 2020-05-03 MED FILL — CLONAZEPAM 1MG: 30 days supply | Qty: 60 | Fill #2

## 2020-05-13 ENCOUNTER — Ambulatory Visit (HOSPITAL_BASED_OUTPATIENT_CLINIC_OR_DEPARTMENT_OTHER): Payer: Self-pay | Admitting: Internal Medicine

## 2020-05-21 ENCOUNTER — Ambulatory Visit: Payer: No Typology Code available for payment source | Attending: Neurology | Admitting: Neurology

## 2020-05-21 DIAGNOSIS — F39 Unspecified mood [affective] disorder: Secondary | ICD-10-CM

## 2020-05-21 NOTE — Progress Notes (Signed)
NEUROLOGY FOLLOW-UP NOTE    Today I saw the patient in follow-up.  I had last seen the patient on 02-20-2020.  My Formulation on that day was as follows:  "I suspect that patient's memory difficulties are due to her depression (i.e., depressive pseudodementia).  Plan is that she will continue to see Mirna Mires for rx of depression.  I will see patient in f/u in 2, 3 months."    In the interim, Mirna Mires increased venlafaxine dose to 150 mg po q day.  Patient reports that both her mood and memory have improved a bit.  She cries at hearing bad news, etc.  On average, she cries x 3, 4 per week, an improvement over 3 months ago.      FORMULATION:    1) I have suspected that patient's memory difficulties are due to her depression (i.e., depressive pseudodementia), and her course to date (memory has improved as mood has improved) is consistent with that formulation.  I see that patient is not scheduled to see Mirna Mires in f/u, and I encouraged the patient to make a f/u appointment with Ms. Lombardi.  I have not scheduled the patient for a return visit with me, but I am available if questions or problems arise.    2) Patient has been taking her BP at home and reports that systolic BP's have been 160-180 at times.  I advised her to relay this information to her PCP.      Lalla Brothers, M.D.  Neurologist      Addendum: The patient was seen with the assistance of a Portuguese/English interpreter available via telephone. Twenty (20) minutes were spent today pertaining to this visit.

## 2020-05-22 ENCOUNTER — Encounter (HOSPITAL_BASED_OUTPATIENT_CLINIC_OR_DEPARTMENT_OTHER): Payer: Self-pay | Admitting: "Psychiatric/Mental Health

## 2020-05-22 ENCOUNTER — Encounter (HOSPITAL_BASED_OUTPATIENT_CLINIC_OR_DEPARTMENT_OTHER): Payer: Self-pay | Admitting: Internal Medicine

## 2020-05-22 ENCOUNTER — Encounter (HOSPITAL_BASED_OUTPATIENT_CLINIC_OR_DEPARTMENT_OTHER): Payer: Self-pay | Admitting: Family Medicine

## 2020-05-22 ENCOUNTER — Other Ambulatory Visit (HOSPITAL_BASED_OUTPATIENT_CLINIC_OR_DEPARTMENT_OTHER): Payer: Self-pay | Admitting: "Psychiatric/Mental Health

## 2020-05-22 DIAGNOSIS — I10 Essential (primary) hypertension: Secondary | ICD-10-CM

## 2020-05-22 MED FILL — VITAMIN D 50MCG (2000U): 90 days supply | Qty: 180 | Fill #0 | Status: CP

## 2020-05-22 MED FILL — VALSARTAN 320MG: 30 days supply | Qty: 30 | Fill #6 | Status: CP

## 2020-05-22 MED FILL — CLONAZEPAM 1MG: 30 days supply | Qty: 60 | Fill #2 | Status: CP

## 2020-05-22 NOTE — Telephone Encounter (Signed)
PER Pharmacy, Sara Snyder is a 64 year old female has requested a refill of effexor .      Last Office Visit: 01/31/2020 with linda lombardi   Last Physical Exam: n/a       Other Med Adult:  Most Recent BP Reading(s)  01/31/20 : 161/85        Cholesterol (mg/dL)   Date Value   28/83/3744 223     LOW DENSITY LIPOPROTEIN DIRECT (mg/dL)   Date Value   51/46/0479 159     HIGH DENSITY LIPOPROTEIN (mg/dL)   Date Value   98/72/1587 43     TRIGLYCERIDES (mg/dL)   Date Value   27/61/8485 169 (H)         THYROID SCREEN TSH REFLEX FT4 (uIU/mL)   Date Value   01/24/2020 5.010 (H)         No results found for: TSH    HEMOGLOBIN A1C (%)   Date Value   07/20/2019 5.9 (H)       No results found for: POCA1C      No results found for: INR    SODIUM (mmol/L)   Date Value   01/24/2020 138       POTASSIUM (mmol/L)   Date Value   01/24/2020 4.4           CREATININE (mg/dL)   Date Value   92/76/3943 1.1       Documented patient preferred pharmacies:    [facility] [place], [place] [employer] - [address]  Phone: [phone] Fax: [phone]

## 2020-05-23 ENCOUNTER — Other Ambulatory Visit (HOSPITAL_BASED_OUTPATIENT_CLINIC_OR_DEPARTMENT_OTHER): Payer: Self-pay | Admitting: "Psychiatric/Mental Health

## 2020-05-23 MED ORDER — CLONAZEPAM 1 MG PO TABS
ORAL_TABLET | ORAL | 0 refills | Status: DC
Start: 2020-05-23 — End: 2020-07-03

## 2020-05-23 MED ORDER — TRAZODONE HCL 100 MG PO TABS
ORAL_TABLET | ORAL | 0 refills | Status: DC
Start: 2020-05-23 — End: 2020-06-13

## 2020-05-23 MED FILL — TRAZODONE 100MG: 90 days supply | Qty: 90 | Fill #0

## 2020-05-24 MED FILL — ATENOLOL 50MG: 30 days supply | Qty: 60 | Fill #3

## 2020-06-03 ENCOUNTER — Telehealth (HOSPITAL_BASED_OUTPATIENT_CLINIC_OR_DEPARTMENT_OTHER): Payer: Self-pay

## 2020-06-03 DIAGNOSIS — I701 Atherosclerosis of renal artery: Secondary | ICD-10-CM

## 2020-06-03 NOTE — Telephone Encounter (Signed)
Able to connect with patient daughter today. 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 Will refer to Vascular surgery for further evaluation and possible stent placement,.

## 2020-06-04 ENCOUNTER — Telehealth (HOSPITAL_BASED_OUTPATIENT_CLINIC_OR_DEPARTMENT_OTHER): Payer: Self-pay | Admitting: Registered Nurse

## 2020-06-04 ENCOUNTER — Encounter (HOSPITAL_BASED_OUTPATIENT_CLINIC_OR_DEPARTMENT_OTHER): Payer: Self-pay | Admitting: "Psychiatric/Mental Health

## 2020-06-04 ENCOUNTER — Other Ambulatory Visit (HOSPITAL_BASED_OUTPATIENT_CLINIC_OR_DEPARTMENT_OTHER): Payer: Self-pay | Admitting: "Psychiatric/Mental Health

## 2020-06-04 ENCOUNTER — Encounter (HOSPITAL_BASED_OUTPATIENT_CLINIC_OR_DEPARTMENT_OTHER): Payer: Self-pay | Admitting: Internal Medicine

## 2020-06-04 MED ORDER — VENLAFAXINE HCL ER 150 MG PO CP24
ORAL_CAPSULE | ORAL | 0 refills | Status: DC
Start: 2020-06-04 — End: 2020-06-19

## 2020-06-04 MED FILL — ATENOLOL 50MG: 30 days supply | Qty: 60 | Fill #3

## 2020-06-04 MED FILL — *VENLAFAXINE 150MG ER: 30 days supply | Qty: 30 | Fill #0

## 2020-06-04 NOTE — Telephone Encounter (Signed)
Person calling on behalf of patient: Daughter    Sara Snyder is a 64 year old female who is a patient requesting refills for medication(s)      - medication(s) request: Venlafaxine 150 mg 24 hr  - last office visit: 01/31/20 (televisit)  - last physical exam: n/a  - Next apt: 07/03/2020      Other Med Adult:  Most Recent BP Reading(s)  01/31/20 : 161/85        Cholesterol (mg/dL)   Date Value   06/10/7587 223     LOW DENSITY LIPOPROTEIN DIRECT (mg/dL)   Date Value   32/54/9826 159     HIGH DENSITY LIPOPROTEIN (mg/dL)   Date Value   41/58/3094 43     TRIGLYCERIDES (mg/dL)   Date Value   07/68/0881 169 (H)         THYROID SCREEN TSH REFLEX FT4 (uIU/mL)   Date Value   01/24/2020 5.010 (H)         No results found for: TSH    HEMOGLOBIN A1C (%)   Date Value   07/20/2019 5.9 (H)       No results found for: POCA1C      No results found for: INR    SODIUM (mmol/L)   Date Value   01/24/2020 138       POTASSIUM (mmol/L)   Date Value   01/24/2020 4.4           CREATININE (mg/dL)   Date Value   04/21/5944 1.1           Documented patient preferred pharmacies:    [facility] [place], [place] - [address]  Phone: [phone] Fax: [phone]    [facility], [place] - [address]  Phone: [phone] Fax: [phone]

## 2020-06-04 NOTE — Telephone Encounter (Signed)
-----   Message from Charlsie Merles sent at 06/04/2020 11:53 AM EST -----  Regarding: Patient requesting new orders  Sara Snyder [id number], 64 year old, female    Calls today:  Clinical Questions (NON-SICK CLINICAL QUESTIONS ONLY)    Name of person calling [name] (daughter)  Specific nature of request Patient's orders for XR DXA BONE DENSITOMETRY and [place] DIAGNOSTIC MAMMO UNI RIGHT DIGITAL WITH DBT & CAD have expired and daughter called to request new orders for both. Patient has upcoming appointment with PCP but would like the orders placed now so that she can schedule these appointments asap.  Return phone number [phone]  Person calling on behalf of patient: Daughter    Cleotis Lema NUMBER: [phone]  Best time to call back:   Cell phone:   Other phone:    Patient's language of care: [place] ([place]    Patient needs a [place] interpreter.    Patient's PCP: Overton Mam, MD

## 2020-06-04 NOTE — Telephone Encounter (Signed)
Routing to provider.     Georgena Spurling, RN, 06/04/2020

## 2020-06-13 ENCOUNTER — Other Ambulatory Visit: Payer: Self-pay

## 2020-06-13 ENCOUNTER — Ambulatory Visit: Payer: No Typology Code available for payment source | Attending: Internal Medicine | Admitting: Internal Medicine

## 2020-06-13 ENCOUNTER — Encounter (HOSPITAL_BASED_OUTPATIENT_CLINIC_OR_DEPARTMENT_OTHER): Payer: Self-pay | Admitting: Internal Medicine

## 2020-06-13 VITALS — BP 158/84 | HR 81 | Temp 97.5°F | Resp 18 | Ht 58.66 in | Wt 193.0 lb

## 2020-06-13 DIAGNOSIS — N2581 Secondary hyperparathyroidism of renal origin: Secondary | ICD-10-CM

## 2020-06-13 DIAGNOSIS — R928 Other abnormal and inconclusive findings on diagnostic imaging of breast: Secondary | ICD-10-CM | POA: Diagnosis not present

## 2020-06-13 DIAGNOSIS — E559 Vitamin D deficiency, unspecified: Secondary | ICD-10-CM

## 2020-06-13 DIAGNOSIS — F5104 Psychophysiologic insomnia: Secondary | ICD-10-CM | POA: Diagnosis not present

## 2020-06-13 DIAGNOSIS — R7303 Prediabetes: Secondary | ICD-10-CM | POA: Diagnosis not present

## 2020-06-13 DIAGNOSIS — Z23 Encounter for immunization: Secondary | ICD-10-CM | POA: Diagnosis not present

## 2020-06-13 DIAGNOSIS — I10 Essential (primary) hypertension: Secondary | ICD-10-CM

## 2020-06-13 DIAGNOSIS — Z0000 Encounter for general adult medical examination without abnormal findings: Secondary | ICD-10-CM | POA: Diagnosis present

## 2020-06-13 LAB — URINALYSIS
BILIRUBIN, URINE: NEGATIVE
GLUCOSE, URINE: NEGATIVE MG/DL
KETONE, URINE: NEGATIVE MG/DL
LEUKOCYTE ESTERASE: NEGATIVE
NITRITE, URINE: NEGATIVE
OCCULT BLOOD, URINE: NEGATIVE
PH URINE: 6.5 (ref 5.0–8.0)
PROTEIN, URINE: NEGATIVE MG/DL
SPECIFIC GRAVITY URINE: 1.02 (ref 1.003–1.035)

## 2020-06-13 LAB — VITAMIN D,25 HYDROXY: VITAMIN D,25 HYDROXY: 31 ng/mL (ref 30.0–100.0)

## 2020-06-13 LAB — PTH INTACT WITH CALCIUM
PTH INTACT CALCIUM: 9.1 mg/dL (ref 8.5–10.1)
PTH INTACT: 84 pg/mL — ABNORMAL HIGH (ref 15–65)

## 2020-06-13 LAB — ALBUMIN: ALBUMIN: 3.5 g/dL (ref 3.4–5.0)

## 2020-06-13 LAB — PHOSPHORUS: PHOSPHORUS: 3.1 mg/dL (ref 2.5–4.9)

## 2020-06-13 MED ORDER — RAMELTEON 8 MG PO TABS
8.00 mg | ORAL_TABLET | Freq: Every evening | ORAL | 3 refills | Status: AC
Start: 2020-06-13 — End: 2021-06-13

## 2020-06-13 MED ORDER — CHLORTHALIDONE 25 MG PO TABS
25.0000 mg | ORAL_TABLET | Freq: Every day | ORAL | 3 refills | Status: DC
Start: 2020-06-13 — End: 2021-05-14

## 2020-06-13 MED ORDER — CHLORTHALIDONE 50 MG PO TABS
25.0000 mg | ORAL_TABLET | Freq: Every day | ORAL | 3 refills | Status: DC
Start: 2020-06-13 — End: 2020-06-13

## 2020-06-13 MED ORDER — ATENOLOL 100 MG PO TABS
100.0000 mg | ORAL_TABLET | Freq: Every evening | ORAL | 3 refills | Status: DC
Start: 2020-06-13 — End: 2021-09-06

## 2020-06-13 MED ORDER — VALSARTAN 320 MG PO TABS
320.0000 mg | ORAL_TABLET | Freq: Every day | ORAL | 11 refills | Status: DC
Start: 2020-06-13 — End: 2020-06-13

## 2020-06-13 MED ORDER — AMLODIPINE BESYLATE-VALSARTAN 5-320 MG PO TABS
1.0000 | ORAL_TABLET | Freq: Every day | ORAL | 2 refills | Status: DC
Start: 2020-06-13 — End: 2021-03-11

## 2020-06-13 MED FILL — CHLORTHALID 25MG: 90 days supply | Qty: 90 | Fill #0 | Status: CP

## 2020-06-13 MED FILL — ATENOLOL 100MG: 90 days supply | Qty: 90 | Fill #0 | Status: CP

## 2020-06-13 MED FILL — ROZEREM   8MG: 30 days supply | Qty: 30 | Fill #0 | Status: CP

## 2020-06-13 MED FILL — AMLOD/VALSAR 5-320MG: 90 days supply | Qty: 90 | Fill #0 | Status: CP

## 2020-06-13 NOTE — Progress Notes (Signed)
HPI   1. Physical   2. Essential htn : meds not working , wheezing sound in the chest and would like to get checked   3. Insomnia : feels like her sleep is not great and medication is not working   4. Chronic cough and allergies   5. Anxiety and depression   6. Abnormal labs that needs follow up       Social History     Socioeconomic History    Marital status: Married     Spouse name: Not on file    Number of children: Not on file    Years of education: Not on file    Highest education level: Not on file   Occupational History    Not on file   Tobacco Use    Smoking status: Former Smoker     Packs/day: 2.00     Years: 25.00     Pack years: 50.00     Quit date: 10/27/1977     Years since quitting: 42.6    Smokeless tobacco: Never Used   Substance and Sexual Activity    Alcohol use: Not Currently    Drug use: Never    Sexual activity: Yes     Partners: Male   Other Topics Concern    Not on file   Social History Narrative    Originally from [place], [place]    History of childhood trauma    Lives with family   Social Determinants of Health  Financial Resource Strain:     Difficulty of Paying Living Expenses: Not on file  Food Insecurity:     Worried About Programme researcher, broadcasting/film/video in the Last Year: Not on file    [employer] of Food in the Last Year: Not on file  Transportation Needs:     Lack of Transportation (Medical): Not on file    Lack of Transportation (Non-Medical): Not on file  Physical Activity:     Days of Exercise per Week: Not on file    Minutes of Exercise per Session: Not on file  Stress:     Feeling of Stress : Not on file  Social Connections:     Frequency of Communication with Friends and Family: Not on file    Frequency of Social Gatherings with Friends and Family: Not on file    Attends Religious Services: Not on file    Active Member of Clubs or Organizations: Not on file    Attends Banker Meetings: Not on file    Marital Status: Not on file  Intimate Partner  Violence:     Fear of Current or Ex-Partner: Not on file    Emotionally Abused: Not on file    Physically Abused: Not on file    Sexually Abused: Not on file  Patient Active Problem List:     Essential hypertension     Labyrinthitis     Panic disorder     Hyperlipidemia     Chronic cough     Mild episode of recurrent major depressive disorder (HCC)     Allergic rhinitis     Need for prophylactic vaccination and inoculation against influenza     Left arm pain     Stage 3a chronic kidney disease     Decreased GFR     Anxiety state     Palpitations     Psychophysiological insomnia     COVID-19    Past Surgical History:  10/2016: HEMORRHOID SURGERY  No  date: OB ANTEPARTUM CARE CESAREAN DLVR & POSTPARTUM  No family history on file.    venlafaxine (EFFEXOR-XR) 150 MG 24 hr capsule, 1 capsule AM, Disp: 30 capsule, Rfl: 0  clonazePAM (KLONOPIN) 1 MG tablet, 1 tablet twice daily, PRN, Disp: 60 tablet, Rfl: 0  traZODone (DESYREL) 100 MG tablet, One tab po nightly, Disp: 90 tablet, Rfl: 0  cholecalciferol (VITAMIN D3) 2000 UNIT tablet, Take 1 tablet by mouth 2 (two) times daily, Disp: 180 tablet, Rfl: 0  atenolol (TENORMIN) 50 MG tablet, TAKE 1 TABLET BY MOUTH EVERY MORNING AND 1 TABLET EVERY EVENING, Disp: 60 tablet, Rfl: 5  metFORMIN (GLUCOPHAGE) 500 MG tablet, Take 1 tablet by mouth 2 (two) times daily with meals Start 1 tb with dinner for one week., Disp: 60 tablet, Rfl: 5  fluticasone (FLONASE) 50 MCG/ACT nasal spray, 1 spray by Each Nostril route daily, Disp: 1 Bottle, Rfl: 5  valsartan (DIOVAN) 320 MG tablet, Take 1 tablet by mouth daily, Disp: 30 tablet, Rfl: 11  chlorthalidone (HYGROTEN) 50 MG tablet, Take 1 tablet by mouth daily, Disp: 90 tablet, Rfl: 3  Multiple Vitamins-Minerals (HAIR SKIN AND NAILS FORMULA) TABS, Take 1 capsule by mouth daily, Disp: 30 tablet, Rfl: 12    No current facility-administered medications for this visit.    Review of Patient's Allergies indicates:   Pollen extract           Cough  ROS            BP (!) 158/84 (Site: LA, Position: Sitting, Cuff Size: Lrg)    Pulse 81    Temp 97.5 F (36.4 C) (Temporal)    Resp 18    Ht 4' 10.66" (1.49 m)    Wt 87.5 kg (193 lb)    SpO2 97%    BMI 39.43 kg/m   Physical Exam    HEMOGLOBIN A1C (%)   Date Value   07/20/2019 5.9 (H)   04/10/2019 6.0 (H)   08/22/2018 5.8 (H)       BUN (UREA NITROGEN) (mg/dL)   Date Value   18/84/1660 26 (H)   11/13/2019 23 (H)   07/20/2019 17       CREATININE (mg/dL)   Date Value   63/06/6008 1.1   11/13/2019 1.1   07/20/2019 1.3 (H)     LOW DENSITY LIPOPROTEIN DIRECT (mg/dL)   Date Value   93/23/5573 159   10/27/2017 144       The 10-year ASCVD risk score Denman George DC Jr., et al., 2013) is: 14.2%    Values used to calculate the score:      Age: 64 years      Sex: Female      Is Non-Hispanic African American: No      Diabetic: No      Tobacco smoker: No      Systolic Blood Pressure: 158 mmHg      Is BP treated: Yes      HDL Cholesterol: 43 mg/dL      Total Cholesterol: 223 mg/dL    Kidney Ultrasound normal     (Z00.00) Visit for preventive health examination  (primary encounter diagnosis)  Comment:   Plan: XR DXA BONE DENSITOMETRY, [place] SCREENING MAMMO         BILATERAL DIGITAL WITH DBT & CAD            (I10) Essential hypertension  Comment: not well controlled on current regimen   Pt is very confused as to when to take the  meds   Will start Exforge : valsartan 320/amlodipine 5mg    Increase atenolol 50-->100mg  at night   Chlorthalidone 50mg  ---> decrease to 25mg  po daily ,: take half tab po daily of the 50 mg if alreeady filled  ( take  in the afternoon )   Plan: HEMOGLOBIN A1C, COMPREHENSIVE METABOLIC PANEL,         METANEPHRINES 24 HOUR URINE, DUPLEX RENAL         ARTERIES, URINALYSIS, atenolol (TENORMIN) 100         MG tablet, LIPID PANEL, DISCONTINUED: valsartan        (DIOVAN) 320 MG tablet  Elevated normetanephrine , may need 24 hr urine sample collection   Will also check for the renal artery stenosis : duplex renal  ordered   Check all labs today               (N25.81) Secondary hyperparathyroidism (HCC)  Comment: needs labs ordered by endocrinology   Plan: PHOSPHORUS, PTH INTACT WITH CALCIUM, ALBUMIN,         VITAMIN D,25 HYDROXY            (E55.9) Vitamin D deficiency  Comment:   Plan: supplements    (Z23) Need for prophylactic vaccination/inoculation against viral disease  Comment:   Plan: PFIZER COVID-19 VACCINE  , IMMUNIZATION ADMIN         SINGLE           (Z23) Need for prophylactic vaccination and inoculation against influenza  Comment:   Plan: IMMUNIZATION ADMIN SINGLE, IIV4 VACC PRESERV         FREE AGE 53 MONTHS AND OLDER, 0.5ML, IM          Insomnia : current meds not working   Will d/c trazodone, start rozerem and see if it helps         We discussed the importance of medication compliance. The patient was ready to learn and no apparent learning barriers were identified. I explained the diagnosis and treatment plan, and the patient expressed understanding of the content. Possible side effects of the prescribed medication(s) were explained, .  I attempted to answer any questions regarding the diagnosis and the proposed treatment.    We discussed the patients current medications. The patient expressed understanding and no barriers to adherence were identified.      , MD

## 2020-06-13 NOTE — Progress Notes (Signed)
Influenza Vaccine Procedure  June 13, 2020    1. Has the patient received the information for the influenza vaccine? Yes    2. Does the patient have any of the following contraindications?  Allergy to eggs? No  Allergic reaction to previous influenza vaccines? No  Any other problems to previous influenza vaccines? No  Paralyzed by Guillain-Barre syndrome?  No  Current moderate or severe illness? No  Allergy to contact lens solution? No    3. The vaccine has been administered in the usual fashion.   Administered COVID-19 booster, Pfizer (3rd dose)  Reviewed pt education RE: vaccine side effects.  Pt tolerated well.  Immunization information reviewed. Current VIS reviewed and given to patient/ guardian. Verbal assent obtained from patient/ guardian.  See immunization/Injection module or chart review for date of publication and additional information. Verbal assent obtained from patient/guardian. Comfort measures for possible side effects reviewed.   LAIYA WISBY LPN, 35/32/9924

## 2020-06-14 LAB — COMPREHENSIVE METABOLIC PANEL
ALANINE AMINOTRANSFERASE: 47 U/L — ABNORMAL HIGH (ref 12–45)
ALBUMIN: 3.5 g/dL (ref 3.4–5.0)
ALKALINE PHOSPHATASE: 124 U/L — ABNORMAL HIGH (ref 45–117)
ANION GAP: 5 mmol/L (ref 5–15)
ASPARTATE AMINOTRANSFERASE: 15 U/L (ref 8–34)
BILIRUBIN TOTAL: 0.2 mg/dL (ref 0.2–1.0)
BUN (UREA NITROGEN): 22 mg/dL — ABNORMAL HIGH (ref 7–18)
CALCIUM: 9 mg/dL (ref 8.5–10.1)
CARBON DIOXIDE: 30 mmol/L (ref 21–32)
CHLORIDE: 104 mmol/L (ref 98–107)
CREATININE: 1.2 mg/dL (ref 0.4–1.2)
ESTIMATED GLOMERULAR FILT RATE: 50 mL/min — ABNORMAL LOW (ref >60)
Glucose Random: 101 mg/dL (ref 74–160)
POTASSIUM: 3.8 mmol/L (ref 3.5–5.1)
SODIUM: 139 mmol/L (ref 136–145)
TOTAL PROTEIN: 7.6 g/dL (ref 6.4–8.2)

## 2020-06-14 LAB — LIPID PANEL
Cholesterol: 215 mg/dL (ref 0–239)
HIGH DENSITY LIPOPROTEIN: 43 mg/dL (ref 40–?)
LOW DENSITY LIPOPROTEIN DIRECT: 141 mg/dL (ref 0–189)
TRIGLYCERIDES: 148 mg/dL (ref 0–150)

## 2020-06-14 LAB — HOLD UC GRAY TUBE

## 2020-06-15 MED FILL — CHLORTHALID 50MG: 90 days supply | Qty: 90 | Fill #1

## 2020-06-17 ENCOUNTER — Telehealth (HOSPITAL_BASED_OUTPATIENT_CLINIC_OR_DEPARTMENT_OTHER): Payer: Self-pay | Admitting: Endocrinology

## 2020-06-17 ENCOUNTER — Other Ambulatory Visit (HOSPITAL_BASED_OUTPATIENT_CLINIC_OR_DEPARTMENT_OTHER): Payer: Self-pay | Admitting: Endocrinology

## 2020-06-17 DIAGNOSIS — N2581 Secondary hyperparathyroidism of renal origin: Secondary | ICD-10-CM

## 2020-06-17 DIAGNOSIS — E559 Vitamin D deficiency, unspecified: Secondary | ICD-10-CM

## 2020-06-17 LAB — HEMOGLOBIN A1C
ESTIMATED AVERAGE GLUCOSE: 131 mg/dL (ref 74–160)
HEMOGLOBIN A1C: 6.2 % — ABNORMAL HIGH (ref 4.0–5.6)

## 2020-06-17 MED ORDER — VITAMIN D 50 MCG (2000 UT) PO TABS
1.0000 | ORAL_TABLET | Freq: Two times a day (BID) | ORAL | 3 refills | Status: DC
Start: 2020-06-17 — End: 2021-11-06

## 2020-06-17 NOTE — Telephone Encounter (Signed)
Please call patient with interpreter  I reviewed her blood tests, ordered by Dr. Cyndy Freeze, who is no longer at [facility].  Her vitamin D level is now normal, and PTH almost down to normal.   I would like her to continue the vitamin D 2000 units twice daily.    I sent a new script to [facility] for this.   If that is not the correct pharmacy, let me know        Component      Latest Ref Rng & Units 06/13/2020   PTH INTACT CALCIUM      8.5 - 10.1 mg/dL 9.1   PTH INTACT      15 - 65 pg/mL 84 (H)   PHOSPHORUS      2.5 - 4.9 mg/dL 3.1   ALBUMIN      3.4 - 5.0 g/dL 3.5   VITAMIN S,28 HYDROXY      30.0 - 100.0 ng/mL 31

## 2020-06-18 ENCOUNTER — Other Ambulatory Visit: Payer: Self-pay

## 2020-06-18 NOTE — Telephone Encounter (Addendum)
Please call patient with interpreter  I reviewed her blood tests, ordered by Dr. Cyndy Freeze, who is no longer at [facility].  Her vitamin D level is now normal, and PTH almost down to normal.   I would like her to continue the vitamin D 2000 units twice daily.    I sent a new script to [facility] for this.   If that is not the correct pharmacy, let me know      I called the patient with the assistance of the [place].  Daughter answered and the patient gave permission to speak with me.  The above discussed with the patient.  She understands the plan was able to teach back.  The [facility] pharmacy of her choice is correct.  I educated her about her PTH level and hyperparathyroid level.  I scheduled her for a f/u with Dr. Timoteo Expose.  She appreciated the f/u.

## 2020-06-19 ENCOUNTER — Other Ambulatory Visit (HOSPITAL_BASED_OUTPATIENT_CLINIC_OR_DEPARTMENT_OTHER): Payer: Self-pay | Admitting: "Psychiatric/Mental Health

## 2020-06-19 MED ORDER — VENLAFAXINE HCL ER 150 MG PO CP24
ORAL_CAPSULE | ORAL | 0 refills | Status: DC
Start: 2020-06-19 — End: 2020-07-03

## 2020-06-25 ENCOUNTER — Other Ambulatory Visit: Payer: Self-pay

## 2020-06-25 ENCOUNTER — Ambulatory Visit: Payer: No Typology Code available for payment source | Attending: Internal Medicine | Admitting: Internal Medicine

## 2020-06-25 VITALS — BP 159/76 | HR 80 | Temp 96.9°F | Ht 58.66 in | Wt 192.0 lb

## 2020-06-25 DIAGNOSIS — R0600 Dyspnea, unspecified: Secondary | ICD-10-CM | POA: Diagnosis present

## 2020-06-25 DIAGNOSIS — N1831 Chronic kidney disease, stage 3a: Secondary | ICD-10-CM | POA: Diagnosis not present

## 2020-06-25 DIAGNOSIS — R002 Palpitations: Secondary | ICD-10-CM

## 2020-06-25 DIAGNOSIS — I10 Essential (primary) hypertension: Secondary | ICD-10-CM

## 2020-06-25 DIAGNOSIS — F410 Panic disorder [episodic paroxysmal anxiety] without agoraphobia: Secondary | ICD-10-CM

## 2020-06-25 NOTE — Progress Notes (Signed)
CARDIOLOGY TELEVISIT PROGRESS NOTE  Date of visit: 06/25/2020    Interpreter service was used: Yes , telephone [place] interpreter used.    65 y/o [place] mother of 5 and GM of 3, today  acc'd and interpreted by her dau  [name], cell [phone].  But [name] asks for tele terp, even though her English is good.  So went with the tele, even though the interp will not be as good as a dau who knows her.    In the past was acc'd and interpreted by other dauAna Elyn Peers, cell [phone],  Pt resides with her husband and one dau and one grandchild.          Confirm Patient Identity Sara Snyder ; 1955/12/27)  In summary patient is 65 year old female with the following medical problems:  Stage 3a chronic kidney disease (HCC)  Essential hypertension  Panic disorder   Palpitations    Diastolic dysfunction on ECHO       She was referred by Dr. Raynelle Fanning  for concerns of SVT and VPC's  on Holter of Sept 2020.  Marland Kitchen PMH includes hypertension, hyperlipidemia.    Holter and echo Sept 2020 revealed grade 2 diastolic dysfunction.  Her PCP Dr. Raynelle Fanning referred for for eval of the Holter findings of 7 short runs of SVT but no VT.    Hx includes palpitation with panic attacks 1-2x/year for many years.  In the past she c/o chest pain near her right breast, but not now.  C/o SOB both at rest and with exertion -- c/o SOB while I'm interviewing her but she is not tachypneic with no evidence of distress.  The dyspnea appears to be supratentorial.    She says her breathing is worse since she had Covid Feb 2021. Walked here from the main entrance of the hospital.sin para. Says trouble with breath with walking x years.    Then says she is limited by bilateral knee pain.      Nonexertional. BP  has been labile with SBP from 90 -190.  She has lightheadedness with bending down. No syncope.  She feels extremely fatigue and SOB with minimal housework. No regular exercise during the pandemic.      Current Outpatient Medications:      venlafaxine (EFFEXOR-XR) 150 MG 24 hr capsule, 1 capsule AM, Disp: 30 capsule, Rfl: 0    cholecalciferol (VITAMIN D3) 2000 UNIT tablet, Take 1 tablet by mouth 2 (two) times daily, Disp: 180 tablet, Rfl: 3    amlodipine-valsartan (EXFORGE) 5-320 MG per tablet, Take 1 tablet by mouth daily, Disp: 90 tablet, Rfl: 2    ramelteon (ROZEREM) 8 MG tablet, Take 1 tablet by mouth nightly, Disp: 30 tablet, Rfl: 3    atenolol (TENORMIN) 100 MG tablet, Take 1 tablet by mouth nightly, Disp: 90 tablet, Rfl: 3    chlorthalidone (HYGROTEN) 25 MG tablet, Take 1 tablet by mouth daily, Disp: 90 tablet, Rfl: 3    clonazePAM (KLONOPIN) 1 MG tablet, 1 tablet twice daily, PRN, Disp: 60 tablet, Rfl: 0    metFORMIN (GLUCOPHAGE) 500 MG tablet, Take 1 tablet by mouth 2 (two) times daily with meals Start 1 tb with dinner for one week., Disp: 60 tablet, Rfl: 5    fluticasone (FLONASE) 50 MCG/ACT nasal spray, 1 spray by Each Nostril route daily, Disp: 1 Bottle, Rfl: 5    Multiple Vitamins-Minerals (HAIR SKIN AND NAILS FORMULA) TABS, Take 1 capsule by mouth daily, Disp: 30 tablet, Rfl: 12  Review of Patient's Allergies indicates:   Pollen extract          Cough    SOCIAL & FAMILY HISTORY: No fam hx heart dis. No substances.    Review of systems: All other systems reviewed were pertinently negative apart from the history of present illness.    PHYSICAL EXAM:      PHYSICAL EXAM: 25 Jun 2020:  15976.  80.  Afeb.  99%.  4'11", 192 lbs, BMI 39.           Oct 2020:   170/84.  71.  Afeb.  97%.  5\' 0" ,. 187 lbs, BMIO 37.      General:  Alert and comfortable.  There were no vitals filed for this visit.  HEENT: Ocular muscles are intact and the face is symmetric with midline tongue.  NECK:  No palpable cervical nodes or thyroid, with no JVD.  CHEST: Clear to auscultation, no crackles or rhonchi.  HEART: Regular with no  murmurs, clicks, or gallops/  ABDOMEN: No palpable organs, masses, or tenderness.  EXTREMITIES: No rash, clubbing, or cyanosis,  with no edema.  NEURO: A&O X 3, no obvious motor or sensory deficits  PSYCHIATRIC: Mood and affect are appropriate.  SKIN: No rash      PERTINENT INVESTIGATIONS:    1.  EKG  (on my personal review)             May 2021:  NSR 61.  Very low amp P's -- V1 best seen.  Normal.               Jan 2022:  NSR 72.  LVH voltage.  Normal ST-T's.        2. LABS:   Lab Results   Component Value Date    NA 139 06/13/2020    K 3.8 06/13/2020    CL 104 06/13/2020    CO2 30 06/13/2020    BUN 22 (H) 06/13/2020    CREAT 1.2 06/13/2020    GLUCOSER 101 06/13/2020     Lab Results   Component Value Date    LDL 141 06/13/2020    HDL 43 06/13/2020    TG 148 06/13/2020     No results found for: PROBNP  No results found for: BNP    3. ECHOCARDIOGRAM            September 2020:                    60%. No RWMA's. Gr 2 DD. Asc ao 37 mm..       4. STRESS TEST:     5. HOLTER          Sept 2020:       NSR 70, 52 - 100.  600 APC = 0.6T, 15 AC, 7 short      slow SVT.600 VPC - 0.6%, 2 VC, no VT.  No sxs.         ASSESSMENT/ PLAN:    (R00.2) Palpitations  (primary encounter diagnosis)  Comment:  Holter above Sept 2020 with 600 APC and 600 VPC with 7 short runs SVT, but no VT.      Was already on atenolol.  No further treatment or work-up is indicated given the small burden of VPCs in a patient with no wall motion abnormality on echo and chronic mild symptoms.    Reports palps with anxiety.  Becomes tearful, dau trying to help her answer questions.  Palps -- feels like  heart is beating faster -- lies down until it subsides over an hour.  Occ takes Ativan for it.  No falling.  + vertigo hx. -- "labyrinthitis". - could last a week at a time.  "Head feels bad", can last hours, all day long.  Does not sound like cardiac palps.  No further assessment unless spontaneous hemodynamic c/o's.  She tends to respond affirmatively to virtually all questions re sxs, so better to await spontaneous c/o's    CHEST PRESSURE  May 2021 reports precordial pressure -- "anguish  or pressure -- tightness".  Goes off on tangent about meds, depression, bones, etc etc.  Cautioned to answer questions explicitly.  Dau assists -- twice per day, lasting hours.  No radiation.  Can be severe.  Loquacious -- very poor historian -- doesn't address questions, rambles.  With and without exertion,  Esp when sad -- can't say if assoc'd  with panic attacks.  No assoc'd diaphoresis, + nausea, some.  Advised doesn't sound cardiac.    Negligible exercise.  Most vigorous activity is cooking.  No stairs.  Pillows x 2.  Nocturia x 2.  Exam negative.    (I49.3) PVC (premature ventricular contraction)  Comment: Same as above.    (I10) Essential hypertension  Comment: Labile blood pressure.  She is on chlorthalidone, atenolol and valsartan.  Asked patient to record blood pressure twice a day and reevaluate with in person visit in a month.  17 May:  She didn't do it -- brings the wrist monitor but no written numbers -- instructed to prepare 60 BP's with left column of AM's and right column of PM's.  Jan 2022 did not bring diary.  Seems that it must not be so important to her.    CV Rx Jan 2022:  Norvasc 5.  Valsartan 320.   Chlorthalidone 25.      (E78.5) Hyperlipidemia, unspecified hyperlipidemia type  Comment: 10 year ASCVD risk 12.1%. Will qualify asprin and statin treatment for primary ASCVD prevention.  Wanted to defer Rx.    My impression is that of non-cardiac SOB and chest pressure -- hours, non-exertional, + with emotions.  She has negligible palps - had no sxs during the Holter.    I'm going to leave the BP issue to Dr. Raynelle Fanning -- instructed to prepare the BP diary and take it to her.  The issue of palps is not of concern, nor is the chest pressure.  Will try treadmill stress -- she promises to work hard on the treadmill -- trying to quantitate her exercise.    I'll leave BP control to Dr. Raynelle Fanning -- can't do much with her declining to bring BP diary.    Repeat Holter.  Get treadmill.  Echo was essentially  normal except for grade 2 DD.  But we've changed our criteria for DD, so will repeat it now.    Televisit 2 weeks after the tests.  I suspect that they'll be unremarkable except for some LVH.

## 2020-06-26 NOTE — Addendum Note (Signed)
Addended byCharlton Haws on: 06/26/2020 07:53 AM     Modules accepted: Orders

## 2020-06-28 LAB — EKG

## 2020-06-28 MED FILL — VENLAFAXINE 150MG ER: 30 days supply | Qty: 30 | Fill #0

## 2020-07-02 ENCOUNTER — Telehealth (HOSPITAL_BASED_OUTPATIENT_CLINIC_OR_DEPARTMENT_OTHER): Payer: Self-pay

## 2020-07-02 NOTE — Telephone Encounter (Signed)
Spoke with patient's daughter, [name], who stated that she would notify the patient that their appointment with Dr. Wellington Hampshire on 07/04/20 has been changed to a televisit at 10:50AM. The doctor will be unavailable for an in person visit.    Pasty Spillers, 07/02/2020

## 2020-07-03 ENCOUNTER — Ambulatory Visit: Payer: No Typology Code available for payment source | Attending: Psychiatry | Admitting: "Psychiatric/Mental Health

## 2020-07-03 DIAGNOSIS — F411 Generalized anxiety disorder: Secondary | ICD-10-CM | POA: Diagnosis present

## 2020-07-03 DIAGNOSIS — F3341 Major depressive disorder, recurrent, in partial remission: Secondary | ICD-10-CM | POA: Insufficient documentation

## 2020-07-03 DIAGNOSIS — F410 Panic disorder [episodic paroxysmal anxiety] without agoraphobia: Secondary | ICD-10-CM | POA: Diagnosis present

## 2020-07-03 MED ORDER — CLONAZEPAM 1 MG PO TABS
ORAL_TABLET | ORAL | 1 refills | Status: DC
Start: 2020-07-03 — End: 2020-09-09

## 2020-07-03 MED ORDER — VENLAFAXINE HCL ER 150 MG PO CP24
ORAL_CAPSULE | ORAL | 2 refills | Status: DC
Start: 2020-07-03 — End: 2020-10-11

## 2020-07-03 MED ORDER — VENLAFAXINE HCL ER 37.5 MG PO CP24
ORAL_CAPSULE | ORAL | 2 refills | Status: DC
Start: 2020-07-03 — End: 2020-10-11

## 2020-07-03 NOTE — Progress Notes (Signed)
ADULT OUTPATIENT PSYCHIATRY (OPD)  PSYCHOPHARM FOLLOW UP    LANGUAGE OF CARE:    [place]      LANGUAGE NEEDS MET:    Telephone Interpreter    CHIEF COMPLAINT:     SUBJECTIVE DATA/INTERVAL HISTORY:   Sara Snyder is assessed in a psychopharm f/u televisit with a [place] interpreter.  She missed her appt in Sept and was last assessed on August 11. Rescheduled after "NO further refills until reappoints" was added to her last klonopin script in Dec..  She says she's anxious. She has not called Probation officer re: same or rescheduled in the past 5 months. Asked why she hasn't rescheduled and she replied that she doesn't know how to use the phone. Asked that she have her daughter call if she's having difficulty and she said her "daughter returns home late from work and you're closed then." Her daughter made today's appt.  She reports she doesn't do much-cooks, watches TV. She reports she's had dizziness for some time. Asked duration and she replies, "years".  Reviewed meds:   Effexor 150 mg AM; Klonopin 1 mg BID, PRN(every night, and unable to say how many times used as a 2nd dose/week); Trazodone 100 mg HS-D/C'd by PCP. Taking Rozerem 8 mg HS-takes nightly.   She is fully vaccinated; booster on 12/23.  She reports she is "closed into myself"; reports anxiety, depression, irritability, anhedonia, hopelessness, and worry. She denies anger, guilt, racing thoughts, rumination, or SI. Energy, motivation, appetite, and sleep are fair-normal. She is not hypomanic or overtly psychotic.Marland Kitchen   Discuss increasing Effexor; she agrees.    OBJECTIVE DATA:   CURRENT MEDICATIONS:    Current Outpatient Medications   Medication Sig    traZODone (DESYREL) 50 MG tablet 1/2 - 1 tablet HS, PRN    fluticasone (FLONASE) 50 MCG/ACT nasal spray 1 spray by Each Nostril route daily    benzonatate (TESSALON) 100 MG capsule Take 1 capsule by mouth 3 (three) times daily as needed for Cough  for up to 20 days    venlafaxine (EFFEXOR-XR) 37.5 MG 24  hr capsule 1 tablet AM along with 75 mg-total = 112.5 mg    venlafaxine (EFFEXOR-XR) 75 MG 24 hr capsule Take 1 capsule by mouth daily Take along with 37.5 mg. TDD=112.5 mg.    valsartan (DIOVAN) 320 MG tablet Take 1 tablet by mouth daily    clonazePAM (KLONOPIN) 1 MG tablet 1 tablet twice daily as needed    chlorthalidone (HYGROTEN) 50 MG tablet Take 1 tablet by mouth daily    atenolol (TENORMIN) 50 MG tablet One tab in am and one tab in pm    omeprazole (PRILOSEC) 20 MG capsule Take 1 capsule by mouth daily    meclizine (ANTIVERT) 25 MG TABS Take 1 tablet by mouth every 8 (eight) hours as needed    Multiple Vitamins-Minerals (HAIR SKIN AND NAILS FORMULA) TABS Take 1 capsule by mouth daily    loratadine (CLARITIN) 10 MG tablet Take 1 tablet by mouth daily    calcium-vitamin D (OSCAL-500) 500-400 MG-UNIT per tablet Take 1 tablet by mouth 2 (two) times daily    azelastine (ASTELIN) 0.1 % nasal spray 1 spray by Each Nostril route 2 (two) times daily Use in each nostril as directed    acetaminophen (TYLENOL) 500 MG tablet Take 1 tablet by mouth every 6 (six) hours as needed for Pain     No current facility-administered medications for this visit.  VITAL SIGNS:   There were no vitals filed for this visit.     LABS:    WHITE BLOOD CELL COUNT (TH/uL)   Date Value   04/10/2019 5.8     RED BLOOD CELL COUNT (M/uL)   Date Value   04/10/2019 4.14     HEMOGLOBIN (g/dL)   Date Value   04/10/2019 12.2     No results found for: IDEALHCT  HEMATOCRIT (%)   Date Value   04/10/2019 37.5     No results found for: HCTDIFF  MEAN CORPUSCULAR VOL (fl)   Date Value   04/10/2019 90.6     MEAN CORPUSCULAR HGB (pg)   Date Value   04/10/2019 29.5     MEAN CORP HGB CONC (g/dL)   Date Value   04/10/2019 32.5     No results found for: RDW  PLATELET COUNT (TH/uL)   Date Value   04/10/2019 259     MEAN PLATELET VOLUME (fL)   Date Value   04/10/2019 10.7     NEUTROPHIL % (%)   Date Value   04/10/2019 56.9     LYMPHOCYTE % (%)    Date Value   04/10/2019 34.9     MONOCYTE % (%)   Date Value   04/10/2019 5.3     EOSINOPHIL % (%)   Date Value   04/10/2019 2.1     BASOPHIL % (%)   Date Value   04/10/2019 0.5           ALBUMIN (g/dL)   Date Value   04/10/2019 3.8     ALKALINE PHOSPHATASE (U/L)   Date Value   04/10/2019 117     ALANINE AMINOTRANSFERASE (U/L)   Date Value   04/10/2019 30     ASPARTATE AMINOTRANSFERASE (U/L)   Date Value   04/10/2019 21     Glucose Random (mg/dL)   Date Value   07/20/2019 99     BUN (UREA NITROGEN) (mg/dL)   Date Value   07/20/2019 17     CALCIUM (mg/dL)   Date Value   07/20/2019 9.0     CHLORIDE (mmol/L)   Date Value   07/20/2019 104     CARBON DIOXIDE (mmol/L)   Date Value   07/20/2019 30     CREATININE (mg/dL)   Date Value   07/20/2019 1.3 (H)     POTASSIUM (mmol/L)   Date Value   07/20/2019 3.9     SODIUM (mmol/L)   Date Value   07/20/2019 140     BILIRUBIN TOTAL (mg/dL)   Date Value   04/10/2019 0.3     TOTAL PROTEIN (g/dL)   Date Value   04/10/2019 7.4         THYROID SCREEN TSH REFLEX FT4   Date Value Ref Range Status   07/20/2019 4.710 (H) 0.358 - 3.740 uIU/mL Final     Comment:     **As per the Thyroid Screen protocol, an FT4 test is  automatically ordered for all patients with abnormal TSH  results unless the FT4 has already been performed on this  patient within the last 168 hours.        Cholesterol (mg/dL)   Date Value   04/10/2019 223     LOW DENSITY LIPOPROTEIN DIRECT (mg/dL)   Date Value   04/10/2019 159     HIGH DENSITY LIPOPROTEIN (mg/dL)   Date Value   04/10/2019 43     TRIGLYCERIDES (mg/dL)   Date Value  04/10/2019 169 (H)       HEMOGLOBIN A1C (%)   Date Value   07/20/2019 5.9 (H)     No results found for: POCA1C    RPR QUAL   Date Value Ref Range Status   07/20/2019 Non Reactive Non Reactive Final     Comment:     Performed at:  [facility]  [address], [place], [place]  [id number]  Lab Director: Allyn Kenner MD, Phone:  [phone]       No results found for: HIVAB  No results  found for: VAL  No results found for: CLOZ     Pregnancy/Birth Control (for female patients): N/A    CURRENT TREATMENT/CONTACT INFO FOR OTHER AGENCIES AND [place] (if applicable):    Contact/Community Support    No documentation.         COLUMBIARISK ASSESSMENTS:   Suicide:  C-SSRS OP Last Contact Screener - 08/16/19 1542        C-SSRS OP Last Contact Screener    Have you wished you were dead or wished you could go to sleep and not wake up?  No     Have you actually had any thoughts of killing yourself?   No     Have you done anything, started to do anything, or prepared to do anything to end your life?  No     C-SSRS OP Last Contact Screener Risk Score  No Risk         C-SSRS Risk Assessment    No documentation.         VIOLENCE:   Violence/Abuse Risk    No documentation.          MENTAL STATUS EXAMINATION:  Pleasant; speech-nl rate/tone; attent/conc-fair; mood-"closed into my self" depression-"all the time"; anxiety-"about the world"; panic-no; irritability/impatience-"about being in my daughter's home and not my own"; anger-no; anhedonia-some; sleep-to bed at 12AM-initial insomnia-some, wakings-for BR, EMA-no, difficulty w/ OOB and facing the day-up at 9-10 AM; appetite-nl; energy-nl motivation-fair-nl; isol/withdr-no; fatigue-naps; hopelessness-"sometimes"; guilt-no; no-SI/HI. Coherent but often tangential and needing constant refocus;thought process/associations-fairly organized; thought content-no overt psychosis; fund of knowledge-grossly intact; memory-grossly intact; racing thoughts-no; rumination-no; worry-"a lot"; no-AH,VH,CH,OH,TH;paranoia-no;delusions-no; IOR-no; thought broadcasting/insertion/removal-no; A+Ox3. I/J-nl      RISK ASSESSMENTS:     Violence: low (1)     Addiction: low (1)     BIO/PSYCHO/SOCIAL AND RISK FORMULATION(S): 65 y/o [place] female with longstanding MDD, recurrent, GAD, and Panic D/O now in need of psychopharm follow up,and counseling though she has dropped  out of same in the past.    DIAGNOSES:    MDD, recurrent, in partial remission; GAD; Panic D/O    PLAN:Continue meds as ordered. Next appt-2/24  Increase Effexor XR to 187.5 mg AM - 37.5 mg #30-2 refills  Klonopin 1 mg twice daily, PRN - takes twice daily - usually once per day at 38M - #60-1 refill  .    Rozerem 8 mg HS - Rx'd by PCP  Trazodone 100 mg HS - D/C'd by PCP     INFORMED CONSENT - for any new medication:   N/A     CARE PLAN/ EPISODES:  Linked Episodes   Type: Episode: Status: Noted: Resolved: Last update: Updated by:   [place] Active 08/16/2019  08/16/2019  3:39 PM Felix Pacini, RNCS      Comments:       COUNSELING AND COORDINATION OF CARE PROVIDED:   I spent a total of  40 minutes on this visit on the date of service (total time includes all activities performed on the date of service)      [place], RNCS

## 2020-07-04 ENCOUNTER — Encounter (HOSPITAL_BASED_OUTPATIENT_CLINIC_OR_DEPARTMENT_OTHER): Payer: Self-pay | Admitting: Surgery

## 2020-07-04 ENCOUNTER — Ambulatory Visit: Payer: No Typology Code available for payment source | Attending: Surgery | Admitting: Surgery

## 2020-07-04 ENCOUNTER — Other Ambulatory Visit (HOSPITAL_BASED_OUTPATIENT_CLINIC_OR_DEPARTMENT_OTHER): Payer: Self-pay | Admitting: Surgery

## 2020-07-04 DIAGNOSIS — K551 Chronic vascular disorders of intestine: Secondary | ICD-10-CM | POA: Diagnosis present

## 2020-07-04 DIAGNOSIS — I701 Atherosclerosis of renal artery: Secondary | ICD-10-CM

## 2020-07-04 DIAGNOSIS — I10 Essential (primary) hypertension: Secondary | ICD-10-CM

## 2020-07-04 MED FILL — VENLAFAXINE  37.5MG ER: 30 days supply | Qty: 30 | Fill #0

## 2020-07-04 MED FILL — CLONAZEPAM 1MG: 30 days supply | Qty: 60 | Fill #0

## 2020-07-04 NOTE — Progress Notes (Signed)
OUTPATIENT VASCULAR PROGRESS NOTE  Date of visit:    July 04, 2020    Patient has a tele-visit in our vascular clinic to discuss options available to her for her renal artery stenosis.  As you know, she suffers from hypertension that is extremely labile.  She underwent a renal artery duplex study in May of this past year that demonstrated significant disease bilaterally.  Is also noted to have SMA stenosis of greater than 70%.  Today's visit was to identify any pathology that could be amenable to treatment.  The patient states that her blood pressure ranges from a 200 systolic to 100 systolic.  She takes her medications as directed.  She continues to urinate freely.  She complains of occasional acid reflux but no food fear, weight loss, or diffuse abdominal pain after eating.      No chief complaint on file.      In summary patient is 65 year old female with the following medical problems:   Renal artery atherosclerosis, bilateral (HCC)  (primary encounter diagnosis)   Essential hypertension   Superior mesenteric artery stenosis (HCC).        venlafaxine (EFFEXOR-XR) 150 MG 24 hr capsule, 1 capsule AM in addition to 37.5 mg-total=187.5 mg, Disp: 30 capsule, Rfl: 2  venlafaxine (EFFEXOR-XR) 37.5 MG 24 hr capsule, 1 capsule in addition to 150 mg-total=187.5 mg, Disp: 30 capsule, Rfl: 2  clonazePAM (KLONOPIN) 1 MG tablet, 1 tablet twice daily, PRN, Disp: 60 tablet, Rfl: 1  cholecalciferol (VITAMIN D3) 2000 UNIT tablet, Take 1 tablet by mouth 2 (two) times daily, Disp: 180 tablet, Rfl: 3  amlodipine-valsartan (EXFORGE) 5-320 MG per tablet, Take 1 tablet by mouth daily, Disp: 90 tablet, Rfl: 2  ramelteon (ROZEREM) 8 MG tablet, Take 1 tablet by mouth nightly, Disp: 30 tablet, Rfl: 3  atenolol (TENORMIN) 100 MG tablet, Take 1 tablet by mouth nightly, Disp: 90 tablet, Rfl: 3  chlorthalidone (HYGROTEN) 25 MG tablet, Take 1 tablet by mouth daily, Disp: 90 tablet, Rfl: 3  metFORMIN (GLUCOPHAGE) 500 MG  tablet, Take 1 tablet by mouth 2 (two) times daily with meals Start 1 tb with dinner for one week., Disp: 60 tablet, Rfl: 5  fluticasone (FLONASE) 50 MCG/ACT nasal spray, 1 spray by Each Nostril route daily, Disp: 1 Bottle, Rfl: 5  Multiple Vitamins-Minerals (HAIR SKIN AND NAILS FORMULA) TABS, Take 1 capsule by mouth daily, Disp: 30 tablet, Rfl: 12    No current facility-administered medications on file prior to visit.      Review of Patient's Allergies indicates:   Pollen extract          Cough    SOCIAL & FAMILY HISTORY: Were reviewed and are unchanged from my previous note.    ROS: All other systems reviewed were pertinently negative apart from the history of present illness.    PHYSICAL EXAM:    There were no vitals filed for this visit.  PE was not performed for this tele-visit    PERTINENT INVESTIGATIONS:    1. LABS:   Lab Results   Component Value Date    NA 139 06/13/2020    K 3.8 06/13/2020    CL 104 06/13/2020    CO2 30 06/13/2020    BUN 22 (H) 06/13/2020    CREAT 1.2 06/13/2020    GLUCOSER 101 06/13/2020     Lab Results   Component Value Date    LDL 141 06/13/2020    HDL 43 06/13/2020    TG 148  06/13/2020       VASCULAR LAB/RADIOLOGY: Duplex ultrasound of her vessels demonstrate  Conclusions: (click link below for full report)   Evidence of focal elevated velocities in bilateral proximal renal arteries consistent with >70% stenosis. Evidence of elevated bilateral renal artery resistive indices and bilateral parenchymal resistive indices suggestive of changes in intrarenal perfusion. Incidental finding of focal elevated velocities in the proximal superior mesenteric artery consistent with >70 % stenosis.  RI are 1.0 consistent with severe parenchymal disease    PHOTO: n/a    ASSESSMENT:    PLAN:        (I70.1) Renal artery atherosclerosis, bilateral (HCC)  (primary encounter diagnosis)  Comment: Reviewing her duplex imaging studies, she appears to have severe parenchymal disease that would not be improved  with renal artery revascularization.  Plan: Based on the CORAL and ASTRAL trials, this medical management is paramount for her as revascularization would only run the risks of the procedure with no added benefit.  Considering her renal function is relatively normal, further intervention would not be deemed necessary.  However, if her renal function were to deteriorate with significant hypertension, then a last resort procedure may be in order.    (I10) Essential hypertension  Comment: She is on multiple medications for hypertension.  Unfortunately, the lability of her blood pressures makes it hard to control.  Plan: Per her primary team    (K55.1) Superior mesenteric artery stenosis (HCC)  Comment: Even though she has stenosis noted by duplex ultrasound, she has no symptoms associated with this disease process  Plan: I instructed her on the symptoms that could occur with symptomatic SMA stenosis.  If she were to develop any of these issues, she knows to contact me right away.    I discussed these matters through the [place] interpreter with both the patient and the daughter who understood      Return for follow up in Vascular Clinic on an as-needed basis. They know to contact us earlier if there are any vascular related issues.    A total range of 30 minutes were spent on this visit with 20 minutes spent on televisit counseling with this patient and an interpreter if relevant (and/or coordination of care) on answering all questions concerning the vascular issues above. In addition, 5 minutes were spent on pre charting while 5 minutes were spent on charting and post charting activities all on the same day of the visit.    A majority of this note has been dictated with a voice recognition system. Occasional wrong-word or "sound-A-like" substitutions may have occurred due to the inherent limitations of voice recognition software. Read the chart carefully and recognize, using context, where substitutions have  occurred. Please excuse any identified errors in spelling or syntax. Every effort has been made to appropriately edit and correct upon completion.      Electronically signed by: Bebe Shaggy, MD, 07/04/2020 11:06 AM

## 2020-07-10 MED FILL — ROZEREM   8MG: 30 days supply | Qty: 30 | Fill #1 | Status: CP

## 2020-07-10 MED FILL — VENLAFAXINE 150MG ER: 30 days supply | Qty: 30 | Fill #0 | Status: CP

## 2020-07-19 ENCOUNTER — Other Ambulatory Visit: Payer: Self-pay

## 2020-07-19 ENCOUNTER — Ambulatory Visit: Payer: No Typology Code available for payment source | Attending: Internal Medicine

## 2020-07-19 DIAGNOSIS — I10 Essential (primary) hypertension: Secondary | ICD-10-CM

## 2020-07-19 NOTE — Progress Notes (Signed)
Pt dropped off 24 hr Urine Specimen.  Released open order(s).  Prepared specimen for courier.  Lynann Beaver, [place], 07/19/2020

## 2020-07-24 LAB — METANEPHRINES 24 HOUR URINE
METANEPHRINE, URINE: 36 ug/L
METANEPHRINES 24HR URINE: 71 ug/24 hr (ref 36–209)
NORMETANEPHRINE 24 HR URINE: 555 ug/24 hr (ref 131–612)
NORMETANEPHRINE, URINE: 281 ug/L
TOTAL VOLUME 24 HOUR URINE: 1975

## 2020-07-25 ENCOUNTER — Ambulatory Visit: Payer: No Typology Code available for payment source | Attending: Internal Medicine | Admitting: Internal Medicine

## 2020-07-25 ENCOUNTER — Encounter (HOSPITAL_BASED_OUTPATIENT_CLINIC_OR_DEPARTMENT_OTHER): Payer: Self-pay | Admitting: Internal Medicine

## 2020-07-25 ENCOUNTER — Other Ambulatory Visit: Payer: Self-pay

## 2020-07-25 ENCOUNTER — Ambulatory Visit (HOSPITAL_BASED_OUTPATIENT_CLINIC_OR_DEPARTMENT_OTHER): Payer: Self-pay | Admitting: Internal Medicine

## 2020-07-25 VITALS — BP 159/83 | HR 63 | Temp 97.5°F | Resp 18 | Ht 58.66 in | Wt 194.0 lb

## 2020-07-25 DIAGNOSIS — R109 Unspecified abdominal pain: Secondary | ICD-10-CM | POA: Insufficient documentation

## 2020-07-25 DIAGNOSIS — R7303 Prediabetes: Secondary | ICD-10-CM | POA: Diagnosis present

## 2020-07-25 DIAGNOSIS — R1013 Epigastric pain: Secondary | ICD-10-CM | POA: Diagnosis present

## 2020-07-25 DIAGNOSIS — I701 Atherosclerosis of renal artery: Secondary | ICD-10-CM | POA: Insufficient documentation

## 2020-07-25 DIAGNOSIS — K219 Gastro-esophageal reflux disease without esophagitis: Secondary | ICD-10-CM | POA: Insufficient documentation

## 2020-07-25 DIAGNOSIS — F39 Unspecified mood [affective] disorder: Secondary | ICD-10-CM | POA: Insufficient documentation

## 2020-07-25 DIAGNOSIS — R10A2 Flank pain, left side: Secondary | ICD-10-CM

## 2020-07-25 DIAGNOSIS — N2581 Secondary hyperparathyroidism of renal origin: Secondary | ICD-10-CM | POA: Diagnosis present

## 2020-07-25 DIAGNOSIS — K551 Chronic vascular disorders of intestine: Secondary | ICD-10-CM | POA: Diagnosis present

## 2020-07-25 DIAGNOSIS — R140 Abdominal distension (gaseous): Secondary | ICD-10-CM | POA: Insufficient documentation

## 2020-07-25 DIAGNOSIS — E6609 Other obesity due to excess calories: Secondary | ICD-10-CM | POA: Diagnosis present

## 2020-07-25 DIAGNOSIS — E66812 Obesity, class 2: Secondary | ICD-10-CM | POA: Insufficient documentation

## 2020-07-25 DIAGNOSIS — J3089 Other allergic rhinitis: Secondary | ICD-10-CM | POA: Diagnosis present

## 2020-07-25 DIAGNOSIS — Z6837 Body mass index (BMI) 37.0-37.9, adult: Secondary | ICD-10-CM | POA: Diagnosis present

## 2020-07-25 LAB — POC URINALYSIS
BILIRUBIN, URINE: NEGATIVE
GLUCOSE,URINE: NEGATIVE
KETONE, URINE: NEGATIVE
NITRITE, URINE: NEGATIVE
OCCULT BLOOD, URINE: NEGATIVE
PH URINE: 6.5 (ref 5.0–8.0)
PROTEIN, URINE: NEGATIVE
SPECIFIC GRAVITY, URINE: 1.02 (ref 1.003–1.030)
UROBILINOGEN URINE: 0.2 (ref 0.2–1.0)

## 2020-07-25 MED ORDER — LIDOCAINE 5 % EX PTCH
1.0000 | MEDICATED_PATCH | CUTANEOUS | 2 refills | Status: DC
Start: 2020-07-25 — End: 2021-07-14

## 2020-07-25 MED ORDER — CYCLOBENZAPRINE HCL 5 MG PO TABS
5.0000 mg | ORAL_TABLET | Freq: Every evening | ORAL | 0 refills | Status: DC
Start: 2020-07-25 — End: 2021-07-14

## 2020-07-25 MED ORDER — OMEPRAZOLE 20 MG PO CPDR
20.0000 mg | DELAYED_RELEASE_CAPSULE | Freq: Every day | ORAL | 5 refills | Status: DC
Start: 2020-07-25 — End: 2020-10-29

## 2020-07-25 MED ORDER — AZELASTINE HCL 0.1 % NA SOLN
1.00 | Freq: Two times a day (BID) | NASAL | 3 refills | Status: AC
Start: 2020-07-25 — End: 2021-07-25

## 2020-07-25 MED FILL — AZELASTINE SPR 0.1%: 30 days supply | Qty: 30 | Fill #0 | Status: CP

## 2020-07-25 MED FILL — LIDOCAINE 5% PATCH: 30 days supply | Qty: 30 | Fill #0 | Status: CP

## 2020-07-25 MED FILL — OMEPRAZOLE   20MG: 30 days supply | Qty: 30 | Fill #0 | Status: CP

## 2020-07-25 MED FILL — *CYCLOBENZAPR 5MG: 20 days supply | Qty: 20 | Fill #0 | Status: CP

## 2020-07-25 NOTE — Progress Notes (Addendum)
HPI   Pt is here for follow up of her symptoms   1. Pt was seen by vascular and pt is now scared about the findings on her renal US   She is hoping to find an answer   Pt was told that she doesn't need any treatment at this time   She would like to know more about it     2. Epigastric pain and bloating   3. Anxiety   4. 24 hr urine results   5. Left flank pain : on and off         Social History     Socioeconomic History    Marital status: Married     Spouse name: Not on file    Number of children: Not on file    Years of education: Not on file    Highest education level: Not on file   Occupational History    Not on file   Tobacco Use    Smoking status: Former Smoker     Packs/day: 2.00     Years: 25.00     Pack years: 50.00     Quit date: 10/27/1977     Years since quitting: 42.7    Smokeless tobacco: Never Used   Substance and Sexual Activity    Alcohol use: Not Currently    Drug use: Never    Sexual activity: Yes     Partners: Male   Other Topics Concern    Not on file   Social History Narrative    Originally from [place], [place]    History of childhood trauma    Lives with family   Social Determinants of Health  Financial Resource Strain:     Difficulty of Paying Living Expenses: Not on file  Food Insecurity:     Worried About Programme researcher, broadcasting/film/video in the Last Year: Not on file    [employer] of Food in the Last Year: Not on file  Transportation Needs:     Lack of Transportation (Medical): Not on file    Lack of Transportation (Non-Medical): Not on file  Physical Activity:     Days of Exercise per Week: Not on file    Minutes of Exercise per Session: Not on file  Stress:     Feeling of Stress : Not on file  Social Connections:     Frequency of Communication with Friends and Family: Not on file    Frequency of Social Gatherings with Friends and Family: Not on file    Attends Religious Services: Not on file    Active Member of Clubs or Organizations: Not on file    Attends Tax inspector Meetings: Not on file    Marital Status: Not on file  Intimate Partner Violence:     Fear of Current or Ex-Partner: Not on file    Emotionally Abused: Not on file    Physically Abused: Not on file    Sexually Abused: Not on file  Patient Active Problem List:     Essential hypertension     Labyrinthitis     Panic disorder     Hyperlipidemia     Chronic cough     Mild episode of recurrent major depressive disorder (HCC)     Allergic rhinitis     Need for prophylactic vaccination and inoculation against influenza     Left arm pain     Stage 3a chronic kidney disease     Decreased GFR  Anxiety state     Palpitations     Psychophysiological insomnia     COVID-19    Past Surgical History:  10/2016: HEMORRHOID SURGERY  No date: OB ANTEPARTUM CARE CESAREAN DLVR & POSTPARTUM  No family history on file.    venlafaxine (EFFEXOR-XR) 150 MG 24 hr capsule, 1 capsule AM in addition to 37.5 mg-total=187.5 mg, Disp: 30 capsule, Rfl: 2  venlafaxine (EFFEXOR-XR) 37.5 MG 24 hr capsule, 1 capsule in addition to 150 mg-total=187.5 mg, Disp: 30 capsule, Rfl: 2  clonazePAM (KLONOPIN) 1 MG tablet, 1 tablet twice daily, PRN, Disp: 60 tablet, Rfl: 1  cholecalciferol (VITAMIN D3) 2000 UNIT tablet, Take 1 tablet by mouth 2 (two) times daily, Disp: 180 tablet, Rfl: 3  amlodipine-valsartan (EXFORGE) 5-320 MG per tablet, Take 1 tablet by mouth daily, Disp: 90 tablet, Rfl: 2  ramelteon (ROZEREM) 8 MG tablet, Take 1 tablet by mouth nightly, Disp: 30 tablet, Rfl: 3  atenolol (TENORMIN) 100 MG tablet, Take 1 tablet by mouth nightly, Disp: 90 tablet, Rfl: 3  chlorthalidone (HYGROTEN) 25 MG tablet, Take 1 tablet by mouth daily, Disp: 90 tablet, Rfl: 3  metFORMIN (GLUCOPHAGE) 500 MG tablet, Take 1 tablet by mouth 2 (two) times daily with meals Start 1 tb with dinner for one week., Disp: 60 tablet, Rfl: 5  fluticasone (FLONASE) 50 MCG/ACT nasal spray, 1 spray by Each Nostril route daily, Disp: 1 Bottle, Rfl: 5  Multiple  Vitamins-Minerals (HAIR SKIN AND NAILS FORMULA) TABS, Take 1 capsule by mouth daily, Disp: 30 tablet, Rfl: 12    No current facility-administered medications for this visit.    Review of Patient's Allergies indicates:   Pollen extract          Cough  ROS            BP (!) 159/83 (Site: LA, Position: Sitting, Cuff Size: Lrg)    Pulse 63    Temp 97.5 F (36.4 C) (Temporal)    Resp 18    Ht 4' 10.66" (1.49 m)    Wt 88 kg (194 lb)    SpO2 98%    BMI 39.64 kg/m   Physical Exam  Physical Exam   Constitutional: She is oriented to person, place, and time and well-developed, well-nourished, and in no distress.   Neurological: She is alert and oriented to person, place, and time. Gait normal.   Skin: Skin is warm and dry.   Psychiatric: Mood, memory, affect and judgment normal.   Nursing note and vitals reviewed.  Left flank : no cva tenderness         BUN (UREA NITROGEN) (mg/dL)   Date Value   63/06/6008 22 (H)   01/24/2020 26 (H)   11/13/2019 23 (H)       CREATININE (mg/dL)   Date Value   93/23/5573 1.2   01/24/2020 1.1   11/13/2019 1.1     (N25.81) Secondary hyperparathyroidism (HCC)  (primary encounter diagnosis)  Comment:   PTH INTACT (pg/mL)   Date Value   06/13/2020 84 (H)   07/20/2019 155.4 (H)       Plan: improving       (F39) Mood disorder (HCC)  Comment:   Plan: following with psych     (I70.1) Bilateral renal artery stenosis (HCC)  Comment: leading to labile HTN   Plan: on multiple meds , pt doesn't need intervention as per vascular     (K55.1) Superior mesenteric artery stenosis (HCC)  Comment:   Plan: medical management as per  vascular   Will follow up and come up with a plan       (J30.89) Non-seasonal allergic rhinitis, unspecified trigger  Comment: runny nose   Plan: REFERRAL TO ENT ( INT)         (R10.9) Left flank pain  Comment: likely musculoskeletal , will check urine dip if positive will consider treatment  Urine dip : trace Leucs   Plan: URINE DIPSTICK    When asked about diet : Pt says she eats Top  Ramen noodles every day ( high in sodium /high in carb and calories )      Encouraged to watch her diet : which is likely causing all her problems including elevated blood pressure , weight gain, anxiety , fatigue, malaise    HEMOGLOBIN A1C (%)   Date Value   06/13/2020 6.2 (H)   07/20/2019 5.9 (H)   04/10/2019 6.0 (H)         We discussed the importance of medication compliance. The patient was ready to learn and no apparent learning barriers were identified. I explained the diagnosis and treatment plan, and the patient expressed understanding of the content. Possible side effects of the prescribed medication(s) were explained, .  I attempted to answer any questions regarding the diagnosis and the proposed treatment.    We discussed the patients current medications. The patient expressed understanding and no barriers to adherence were identified.      Overton Mam, MD

## 2020-07-25 NOTE — Progress Notes (Signed)
The patient is a 65 year old female [place] speaking following for hyperthyroidism. Last visit with Dr.Blendea on 01/2020    This visit was completed with the assistance of a phone [place] interpreter.      HPI: initial visit 01/2020. Seen for hyperparathyroidism where labs noted for elevated PTH 155 and low vit D levels. Patient reports having low energy and depressed mood for some years. She states her weight has been stable. Interested lose weight.No malabsorption symptoms,constipation. Patient denies prior history of bone fractures, hematuria, or kidney stones. Has paresthesias and cramps at the bottom of her soles and constipation.   Gained a lot of weight, "was always skinny". Diet - regular.     She denies height loss. She denies history of poor nutrition. She denies history of an eating disorder. She denies history of radiation treatment. She denies plan for dental procedures in the coming year. Denies/Noticed depressed mood. Denies itchy skin. Denies family history of parathyroid disease.     Last visit she was started on high dose vit D replacement with improvement of Vit D and decrease of PTH levels. She has continued with Vit D 2000 units daily. Initially she was prescribed calcium supplements no longer taking.  Calcium dietary: milk glass daily, milk in coffee, cheese and yogurt 2-3 times daily.    Regarding obesity and weight loss she was started on Metformin however she reports she did not took it due to concern of the kidney function.  She reports weight stable and has not been able to lose weight.  She is not physically active.  Reports regular diet has not seen nutritionist yet    She had an earlier appointment today with her PCP and noted for elevated blood pressure as well as her prior blood pressure elevated in prior visits. 24hr urine urine metanephrine test was done on 07/19/20 was normal.  She is on chlorthalidone, amlodipine-valsartan, atenoplol  for HTN.      DXA-scheduled for this  month  Most Recent Weight Reading(s)  07/25/20 : 88 kg (194 lb)  06/25/20 : 87.1 kg (192 lb)  06/13/20 : 87.5 kg (193 lb)  01/31/20 : 84.4 kg (186 lb)  08/18/19 : 84.7 kg (186 lb 11.7 oz)    BMI Readings from Last 5 Encounters:  07/25/20 : 39.64 kg/m  06/25/20 : 39.23 kg/m  06/13/20 : 39.43 kg/m  01/31/20 : 38.00 kg/m  11/06/19 : 36.96 kg/m       Exercise: household activities,      Menopause: age 64     Diet: regular    I have reviewed the patient's medical, social, family history, allergies and meds in detail.   No history of MEN syndrome, hyperparathyroidism, hypercalcemia, hypercalciuria, kidney stones, thyroid dysfunction.       Review of systems:  General: as per HPI  HEENT: No pain or irritation in the eyes, change in vision, or sore throat  NECK: No anterior neck pain, swelling, hoarseness, or dysphagia  Cv: No chest pain, rest as per HPI  PULM: No SOB, DOE, wheezing, or cough  ABD: No abdominal pain, see HPI  MSK: See HPI  GU: Denies polyuria or dysuria.   NEURO: No dizziness, or headache  PSYCH: No change in mood, depression, anxiety  Rest of review of systems are negative except as stated in HPI.      Exam  Telephone visit  Alert and oriented  No wheezing, able to finish full sentences w/o any distress  Laboratory:      ALBUMIN (g/dL)   Date Value   96/29/5284 3.7          ALKALINE PHOSPHATASE (U/L)   Date Value   01/24/2020 112          ALANINE AMINOTRANSFERASE (U/L)   Date Value   01/24/2020 26          ASPARTATE AMINOTRANSFERASE (U/L)   Date Value   01/24/2020 15      No results found for: GLUCOSEF      BUN (UREA NITROGEN) (mg/dL)   Date Value   13/24/4010 26 (H)      CALCIUM (mg/dL)   Date Value   27/25/3664 9.5          CHLORIDE (mmol/L)   Date Value   01/24/2020 103          CARBON DIOXIDE (mmol/L)   Date Value   01/24/2020 29          CREATININE (mg/dL)   Date Value   40/34/7425 1.1          POTASSIUM (mmol/L)   Date Value   01/24/2020 4.4          SODIUM (mmol/L)   Date Value    01/24/2020 138          BILIRUBIN TOTAL (mg/dL)   Date Value   95/63/8756 0.3          TOTAL PROTEIN (g/dL)   Date Value   43/32/9518 7.6            VITAMIN D,25 HYDROXY (ng/mL)   Date Value   07/20/2019 19 (*L)             PTH INTACT (pg/mL)   Date Value   07/20/2019 155.4 (H)             CALCIUM (mg/dL)   Date Value   84/16/6063 9.5   11/13/2019 9.3   07/20/2019 9.0          THYROID SCREEN TSH REFLEX FT4 (uIU/mL)   Date Value   01/24/2020 5.010 (H)   07/20/2019 4.710 (H)   04/10/2019 5.630 (H)          FREE THYROXINE (ng/dL)   Date Value   01/60/1093 1.14   07/20/2019 1.07   04/10/2019 1.13            Component      Latest Ref Rng & Units 06/13/2020   PTH INTACT CALCIUM      8.5 - 10.1 mg/dL 9.1   PTH INTACT      15 - 65 pg/mL 84 (H)   PHOSPHORUS      2.5 - 4.9 mg/dL 3.1   ALBUMIN      3.4 - 5.0 g/dL 3.5   VITAMIN A,35 HYDROXY      30.0 - 100.0 ng/mL 31        Imaging: DXA - scheduled for 08/15/2020-- not done yet        Assessment/plan:   1. The patient is a 65 year old female who presents for follow up of hyperparathyroidism. Most likely secondary to vitamin D deficiency. Her PTH level almost normalized with improvement of Vit D levels.  Of mention the patient is on chlorthalidone that can lead to calcium retention, but she was never hypercalcemic.  The fact that she is still normocalcemic is very reassuring and an argument against primary hyperparathyroidism.      Plan:   Continue vitamin D   Reviewed  calcium intake in diet , aim 2-3 serving per day and add calcium supplements calcium tablets 600 mg p.o. twice daily if not adecquate dietary calcium intake  Follow up on  DXA.        2. Subclinical hypothyroidism. Would monitor yearly and treat only if progressing into overt hypothyroidism (low FT4, usually TSH>8-10).  In the older age group there seems to be a survival benefit of having TSH in subclinical hypothyroidism range.  At this time I do not think treating would with levothyroxine would be of any  clinical benefit.  TSH with reflex free T3-T4 yearly is indicated     3. Obesity, class II BMI 38. Discussed lowering carb intake, as she has prediabetes and seeing nutritionist. HShe was prescribed metformin however did not start due to concern of kidney function..  I encouraged to do low carb diet and referred to nutritionist  Increase physical activity  She will continue with lifestyle modifications and next appointment can discuss regarding weight loss medications. Would avoid Qsymia or Wellbutrin with uncontrolled BP. Likely would benefit from [facility] if no CI.     All questions were answered and patient agrees with plan.      Follow up in 3 months     Thank you for referring your patient to our endocrine clinic for evaluation. Please do not hesitate to contact me with any questions.

## 2020-07-27 ENCOUNTER — Other Ambulatory Visit (HOSPITAL_BASED_OUTPATIENT_CLINIC_OR_DEPARTMENT_OTHER): Payer: Self-pay | Admitting: Internal Medicine

## 2020-07-27 DIAGNOSIS — I10 Essential (primary) hypertension: Secondary | ICD-10-CM

## 2020-07-30 ENCOUNTER — Other Ambulatory Visit (HOSPITAL_BASED_OUTPATIENT_CLINIC_OR_DEPARTMENT_OTHER): Payer: Self-pay

## 2020-07-30 ENCOUNTER — Other Ambulatory Visit: Payer: Self-pay

## 2020-07-30 ENCOUNTER — Ambulatory Visit
Admission: RE | Admit: 2020-07-30 | Discharge: 2020-07-30 | Disposition: A | Discharge status: Home / Self Care | Payer: No Typology Code available for payment source | Attending: Internal Medicine | Admitting: Internal Medicine

## 2020-07-30 DIAGNOSIS — I10 Essential (primary) hypertension: Secondary | ICD-10-CM | POA: Insufficient documentation

## 2020-07-31 ENCOUNTER — Ambulatory Visit (HOSPITAL_BASED_OUTPATIENT_CLINIC_OR_DEPARTMENT_OTHER): Payer: Self-pay | Admitting: Internal Medicine

## 2020-08-05 ENCOUNTER — Ambulatory Visit (HOSPITAL_BASED_OUTPATIENT_CLINIC_OR_DEPARTMENT_OTHER): Payer: Self-pay | Admitting: Ophthalmology

## 2020-08-06 ENCOUNTER — Ambulatory Visit (HOSPITAL_BASED_OUTPATIENT_CLINIC_OR_DEPARTMENT_OTHER): Payer: Self-pay

## 2020-08-06 ENCOUNTER — Other Ambulatory Visit (HOSPITAL_BASED_OUTPATIENT_CLINIC_OR_DEPARTMENT_OTHER): Payer: Self-pay

## 2020-08-14 MED FILL — VENLAFAXINE 150MG ER: 30 days supply | Qty: 30 | Fill #1 | Status: CP

## 2020-08-14 MED FILL — VENLAFAXINE  37.5MG ER: 30 days supply | Qty: 30 | Fill #1 | Status: CP

## 2020-08-14 MED FILL — CLONAZEPAM 1MG: 30 days supply | Qty: 60 | Fill #1 | Status: CP

## 2020-08-15 ENCOUNTER — Other Ambulatory Visit (HOSPITAL_BASED_OUTPATIENT_CLINIC_OR_DEPARTMENT_OTHER): Payer: Self-pay | Admitting: Internal Medicine

## 2020-08-15 ENCOUNTER — Ambulatory Visit (HOSPITAL_BASED_OUTPATIENT_CLINIC_OR_DEPARTMENT_OTHER)
Admission: RE | Admit: 2020-08-15 | Discharge: 2020-08-15 | Disposition: A | Discharge status: Home / Self Care | Payer: No Typology Code available for payment source | Source: Ambulatory Visit

## 2020-08-15 ENCOUNTER — Encounter (HOSPITAL_BASED_OUTPATIENT_CLINIC_OR_DEPARTMENT_OTHER): Payer: Self-pay

## 2020-08-15 ENCOUNTER — Ambulatory Visit (HOSPITAL_BASED_OUTPATIENT_CLINIC_OR_DEPARTMENT_OTHER): Payer: No Typology Code available for payment source | Admitting: "Psychiatric/Mental Health

## 2020-08-15 ENCOUNTER — Other Ambulatory Visit: Payer: Self-pay

## 2020-08-15 ENCOUNTER — Ambulatory Visit (HOSPITAL_BASED_OUTPATIENT_CLINIC_OR_DEPARTMENT_OTHER)
Admit: 2020-08-15 | Discharge: 2020-08-15 | Disposition: A | Discharge status: Home / Self Care | Payer: No Typology Code available for payment source

## 2020-08-15 ENCOUNTER — Other Ambulatory Visit (HOSPITAL_BASED_OUTPATIENT_CLINIC_OR_DEPARTMENT_OTHER): Payer: Self-pay | Admitting: "Psychiatric/Mental Health

## 2020-08-15 ENCOUNTER — Ambulatory Visit
Admission: RE | Admit: 2020-08-15 | Discharge: 2020-08-15 | Disposition: A | Discharge status: Home / Self Care | Payer: No Typology Code available for payment source | Attending: Internal Medicine | Admitting: Internal Medicine

## 2020-08-15 DIAGNOSIS — Z0000 Encounter for general adult medical examination without abnormal findings: Secondary | ICD-10-CM

## 2020-08-15 DIAGNOSIS — M85852 Other specified disorders of bone density and structure, left thigh: Secondary | ICD-10-CM | POA: Diagnosis present

## 2020-08-15 DIAGNOSIS — M85832 Other specified disorders of bone density and structure, left forearm: Secondary | ICD-10-CM | POA: Diagnosis present

## 2020-08-15 DIAGNOSIS — Z780 Asymptomatic menopausal state: Secondary | ICD-10-CM | POA: Diagnosis present

## 2020-08-15 DIAGNOSIS — E213 Hyperparathyroidism, unspecified: Secondary | ICD-10-CM

## 2020-08-15 DIAGNOSIS — Z1231 Encounter for screening mammogram for malignant neoplasm of breast: Secondary | ICD-10-CM

## 2020-08-15 NOTE — Progress Notes (Signed)
Psychopharm appt today. Called 3 different numbers with interpreter and speaking w/ daughter; no answer; left vmail to call Scheduling to reappoint.Sara Snyder

## 2020-08-16 ENCOUNTER — Other Ambulatory Visit (HOSPITAL_BASED_OUTPATIENT_CLINIC_OR_DEPARTMENT_OTHER): Payer: Self-pay | Admitting: Internal Medicine

## 2020-08-16 ENCOUNTER — Telehealth (HOSPITAL_BASED_OUTPATIENT_CLINIC_OR_DEPARTMENT_OTHER): Payer: Self-pay | Admitting: Registered Nurse

## 2020-08-16 MED ORDER — CALCIUM CARB-CHOLECALCIFEROL 500-400 MG-UNIT PO TABS
1.00 | ORAL_TABLET | Freq: Every day | ORAL | 3 refills | Status: AC
Start: 2020-08-16 — End: 2021-08-16

## 2020-08-16 MED FILL — CALCIUM/D3 500MG/400UNITS: 90 days supply | Qty: 90 | Fill #0

## 2020-08-16 NOTE — Telephone Encounter (Signed)
Due to the language barrier, the phone call was conducted in [place] with an interpreter. The interpreter's name is Nettie Elm. The interpreter was on the phone during the entire phone call.    TC to patient. Relayed results/recs/plan. Pt agrees with plan, but asks if we can have her medication delivered. Her other meds are delivered through [facility] and she has difficulty leaving her house (and a language barrier).     This RN will contact pharmacy to discuss delivery. Pt has no further questions or concerns at this time.     Georgena Spurling, RN, 08/16/2020    TC to [facility], spoke with Alycia Rossetti who will initiate delivery for Monday & call pt to get it scheduled/finalized.     Georgena Spurling, RN, 08/16/2020

## 2020-08-16 NOTE — Progress Notes (Signed)
Dear RN,    Please:    1. Create Telephone encounter for this patient.  2. Share with the patient the attached results   Pt has osteopenia       Plan:  1. No further tests need to be done. .  She should continue with her calcium and vit D supplements   Script sent to [facility] pharmacy : one tab with lunch + exta vit D     2. Type of Outreach: 3 phone calls and if unable to reach send letter     3. Document the conversation in the Telephone Encounter and close the encounter, no need to send back to me.     Thank you,  Overton Mam, MD

## 2020-08-16 NOTE — Telephone Encounter (Signed)
-----   Message from Overton Mam, MD sent at 08/16/2020  9:26 AM EST -----  Dear RN,    Please:    1. Create Telephone encounter for this patient.  2. Share with the patient the attached results   Pt has osteopenia       Plan:  1. No further tests need to be done. .  She should continue with her calcium and vit D supplements   Script sent to [facility] pharmacy : one tab with lunch + exta vit D      2. Type of Outreach: 3 phone calls and if unable to reach send letter     3. Document the conversation in the Telephone Encounter and close the encounter, no need to send back to me.     Thank you,  Overton Mam, MD

## 2020-08-26 ENCOUNTER — Other Ambulatory Visit (HOSPITAL_BASED_OUTPATIENT_CLINIC_OR_DEPARTMENT_OTHER): Payer: Self-pay

## 2020-08-26 ENCOUNTER — Encounter (HOSPITAL_BASED_OUTPATIENT_CLINIC_OR_DEPARTMENT_OTHER): Payer: Self-pay

## 2020-09-06 ENCOUNTER — Other Ambulatory Visit (HOSPITAL_BASED_OUTPATIENT_CLINIC_OR_DEPARTMENT_OTHER): Payer: Self-pay | Admitting: "Psychiatric/Mental Health

## 2020-09-06 MED FILL — *CHLORTHALID 25MG: 90 days supply | Qty: 90 | Fill #1

## 2020-09-06 NOTE — Telephone Encounter (Signed)
PER Pharmacy,@ is a 65 year old female has requested a refill of clonazepam      Last prescribed - start date: 07/03/20 end date: 09/01/20     Last Office Visit: 07/03/20  Last Physical Exam: 06/13/2020  FECAL OCCULT BLOOD AGE 55+ due on 12/03/2018     Other Med Adult:  Most Recent BP Reading(s)  07/25/20 : (!) 159/83        Cholesterol (mg/dL)   Date Value   31/42/7670 215     LOW DENSITY LIPOPROTEIN DIRECT (mg/dL)   Date Value   11/00/3496 141     HIGH DENSITY LIPOPROTEIN (mg/dL)   Date Value   11/64/3539 43     TRIGLYCERIDES (mg/dL)   Date Value   06/15/8345 148         THYROID SCREEN TSH REFLEX FT4 (uIU/mL)   Date Value   01/24/2020 5.010 (H)         No results found for: TSH    HEMOGLOBIN A1C (%)   Date Value   06/13/2020 6.2 (H)       No results found for: POCA1C      No results found for: INR    SODIUM (mmol/L)   Date Value   06/13/2020 139       POTASSIUM (mmol/L)   Date Value   06/13/2020 3.8           CREATININE (mg/dL)   Date Value   21/94/7125 1.2        Documented patient preferred pharmacies:    [facility] [place] [place] - [address]  Phone: [phone] Fax: [phone]

## 2020-09-07 MED FILL — VENLAFAXINE 150MG ER: 30 days supply | Qty: 30 | Fill #2

## 2020-09-07 MED FILL — VENLAFAXINE  37.5MG ER: 30 days supply | Qty: 30 | Fill #2

## 2020-09-09 ENCOUNTER — Other Ambulatory Visit (HOSPITAL_BASED_OUTPATIENT_CLINIC_OR_DEPARTMENT_OTHER): Payer: Self-pay | Admitting: "Psychiatric/Mental Health

## 2020-09-09 MED ORDER — CLONAZEPAM 1 MG PO TABS
ORAL_TABLET | ORAL | 0 refills | Status: DC
Start: 2020-10-08 — End: 2020-09-12

## 2020-09-09 MED FILL — CLONAZEPAM 1MG: 30 days supply | Qty: 60 | Fill #0

## 2020-09-10 ENCOUNTER — Ambulatory Visit: Payer: No Typology Code available for payment source | Admitting: Internal Medicine

## 2020-09-10 ENCOUNTER — Ambulatory Visit (HOSPITAL_BASED_OUTPATIENT_CLINIC_OR_DEPARTMENT_OTHER): Payer: No Typology Code available for payment source | Admitting: Internal Medicine

## 2020-09-10 NOTE — Progress Notes (Signed)
HPI      Social History     Socioeconomic History    Marital status: Married     Spouse name: Not on file    Number of children: Not on file    Years of education: Not on file    Highest education level: Not on file   Occupational History    Not on file   Tobacco Use    Smoking status: Former Smoker     Packs/day: 2.00     Years: 25.00     Pack years: 50.00     Quit date: 10/27/1977     Years since quitting: 42.9    Smokeless tobacco: Never Used   Substance and Sexual Activity    Alcohol use: Not Currently    Drug use: Never    Sexual activity: Yes     Partners: Male   Other Topics Concern    Not on file   Social History Narrative    Originally from [place], [place]    History of childhood trauma    Lives with family   Social Determinants of Health  Financial Resource Strain: Not on file  Food Insecurity: Not on file  Transportation Needs: Not on file  Physical Activity: Not on file  Stress: Not on file  Social Connections: Not on file  Intimate Partner Violence: Not on file  Housing Stability: Not on file  Patient Active Problem List:     Essential hypertension     Labyrinthitis     Panic disorder     Hyperlipidemia     Chronic cough     Mild episode of recurrent major depressive disorder (HCC)     Allergic rhinitis     Need for prophylactic vaccination and inoculation against influenza     Left arm pain     Stage 3a chronic kidney disease     Decreased GFR     Anxiety state     Palpitations     Psychophysiological insomnia     COVID-19     Class 2 obesity due to excess calories without serious comorbidity with body mass index (BMI) of 37.0 to 37.9 in adult     Secondary hyperparathyroidism [facility])    Past Surgical History:  10/2016: HEMORRHOID SURGERY  No date: OB ANTEPARTUM CARE CESAREAN DLVR & POSTPARTUM  No family history on file.    [START ON 10/08/2020] clonazePAM (KLONOPIN) 1 MG tablet, 1 tablet twice daily, PRN, Disp: 60 tablet, Rfl: 0  Calcium Carb-Cholecalciferol 500-400 MG-UNIT TABS, Take 1 tablet by  mouth daily with lunch, Disp: 90 tablet, Rfl: 3  azelastine (ASTELIN) 0.1 % nasal spray, 1 spray by Each Nostril route 2 (two) times daily Use in each nostril as directed, Disp: 30 mL, Rfl: 3  cyclobenzaprine (FLEXERIL) 5 MG tablet, Take 1 tablet by mouth nightly, Disp: 20 tablet, Rfl: 0  lidocaine (LIDODERM) 5 % patch, Place 1 patch onto the skin daily, Disp: 30 patch, Rfl: 2  omeprazole (PRILOSEC) 20 MG capsule, Take 1 capsule by mouth daily, Disp: 30 capsule, Rfl: 5  venlafaxine (EFFEXOR-XR) 150 MG 24 hr capsule, 1 capsule AM in addition to 37.5 mg-total=187.5 mg, Disp: 30 capsule, Rfl: 2  venlafaxine (EFFEXOR-XR) 37.5 MG 24 hr capsule, 1 capsule in addition to 150 mg-total=187.5 mg, Disp: 30 capsule, Rfl: 2  cholecalciferol (VITAMIN D3) 2000 UNIT tablet, Take 1 tablet by mouth 2 (two) times daily, Disp: 180 tablet, Rfl: 3  amlodipine-valsartan (EXFORGE) 5-320 MG per tablet, Take 1 tablet  by mouth daily, Disp: 90 tablet, Rfl: 2  ramelteon (ROZEREM) 8 MG tablet, Take 1 tablet by mouth nightly, Disp: 30 tablet, Rfl: 3  atenolol (TENORMIN) 100 MG tablet, Take 1 tablet by mouth nightly, Disp: 90 tablet, Rfl: 3  chlorthalidone (HYGROTEN) 25 MG tablet, Take 1 tablet by mouth daily, Disp: 90 tablet, Rfl: 3  metFORMIN (GLUCOPHAGE) 500 MG tablet, Take 1 tablet by mouth 2 (two) times daily with meals Start 1 tb with dinner for one week., Disp: 60 tablet, Rfl: 5  Multiple Vitamins-Minerals (HAIR SKIN AND NAILS FORMULA) TABS, Take 1 capsule by mouth daily, Disp: 30 tablet, Rfl: 12    No current facility-administered medications for this visit.    Review of Patient's Allergies indicates:   Pollen extract          Cough  ROS            There were no vitals taken for this visit.  Physical Exam              No diagnosis found.    Assessment        Plan     We discussed the importance of medication compliance. The patient was ready to learn and no apparent learning barriers were identified. I explained the diagnosis and treatment  plan, and the patient expressed understanding of the content. Possible side effects of the prescribed medication(s) were explained, .  I attempted to answer any questions regarding the diagnosis and the proposed treatment.    We discussed the patients current medications. The patient expressed understanding and no barriers to adherence were identified.      Overton Mam, MD

## 2020-09-12 ENCOUNTER — Ambulatory Visit: Payer: No Typology Code available for payment source | Attending: Psychiatry | Admitting: "Psychiatric/Mental Health

## 2020-09-12 ENCOUNTER — Encounter (HOSPITAL_BASED_OUTPATIENT_CLINIC_OR_DEPARTMENT_OTHER): Payer: Self-pay | Admitting: "Psychiatric/Mental Health

## 2020-09-12 DIAGNOSIS — F3341 Major depressive disorder, recurrent, in partial remission: Secondary | ICD-10-CM | POA: Diagnosis present

## 2020-09-12 DIAGNOSIS — F411 Generalized anxiety disorder: Secondary | ICD-10-CM | POA: Diagnosis present

## 2020-09-12 DIAGNOSIS — F410 Panic disorder [episodic paroxysmal anxiety] without agoraphobia: Secondary | ICD-10-CM | POA: Diagnosis present

## 2020-09-12 MED ORDER — CLONAZEPAM 1 MG PO TABS
ORAL_TABLET | ORAL | 2 refills | Status: DC
Start: 2020-09-12 — End: 2020-12-05

## 2020-09-12 MED FILL — CLONAZEPAM 1MG: 30 days supply | Qty: 60 | Fill #0

## 2020-09-12 NOTE — Progress Notes (Signed)
ADULT OUTPATIENT PSYCHIATRY (OPD)  PSYCHOPHARM FOLLOW UP    LANGUAGE OF CARE:    [place]      LANGUAGE NEEDS MET:    Telephone Interpreter    CHIEF COMPLAINT:     SUBJECTIVE DATA/INTERVAL HISTORY:   Sara Snyder is assessed in a psychopharm f/u televisit with a [place] interpreter.  "I'm doing well. I'm staying home b/c I'm concerned that the cold will raise my BP."   She missed her last appt in late Feb. She states her daughter has trouble with the internet. Discuss that internet has nothing to do with answering the phone. Last appt ("Sept and was last assessed on August 11. Rescheduled after "NO further refills until reappoints" was added to her last klonopin script in Dec").  She reports she cooks and watches TV.   She has some back pain so takes a nap mid-day.  "Worried about my kidneys. And they think they will place a stent in my heart."  Reviewed meds:   Effexor 187.5 mg AM; Klonopin 1 mg BID, PRN(every night, and unable to say how many times used as a 2nd dose/week);  Taking Rozerem 8 mg HS-takes nightly per .PCP.   She is fully vaccinated; booster on 12/23.  She reports she is "better, considerably better"; "better" or "a little'- anxiety, depression, irritability; worry-"about my health", no anhedonia, hopelessness, anger, guilt, racing thoughts, rumination, or SI. Energy, motivation, appetite, and sleep are normal. She is not hypomanic or overtly psychotic..   Med compliant. She is improved w/ increased Effexor.     OBJECTIVE DATA:   CURRENT MEDICATIONS:    Current Outpatient Medications   Medication Sig    traZODone (DESYREL) 50 MG tablet 1/2 - 1 tablet HS, PRN    fluticasone (FLONASE) 50 MCG/ACT nasal spray 1 spray by Each Nostril route daily    benzonatate (TESSALON) 100 MG capsule Take 1 capsule by mouth 3 (three) times daily as needed for Cough  for up to 20 days    venlafaxine (EFFEXOR-XR) 37.5 MG 24 hr capsule 1 tablet AM along with 75 mg-total = 112.5 mg    venlafaxine  (EFFEXOR-XR) 75 MG 24 hr capsule Take 1 capsule by mouth daily Take along with 37.5 mg. TDD=112.5 mg.    valsartan (DIOVAN) 320 MG tablet Take 1 tablet by mouth daily    clonazePAM (KLONOPIN) 1 MG tablet 1 tablet twice daily as needed    chlorthalidone (HYGROTEN) 50 MG tablet Take 1 tablet by mouth daily    atenolol (TENORMIN) 50 MG tablet One tab in am and one tab in pm    omeprazole (PRILOSEC) 20 MG capsule Take 1 capsule by mouth daily    meclizine (ANTIVERT) 25 MG TABS Take 1 tablet by mouth every 8 (eight) hours as needed    Multiple Vitamins-Minerals (HAIR SKIN AND NAILS FORMULA) TABS Take 1 capsule by mouth daily    loratadine (CLARITIN) 10 MG tablet Take 1 tablet by mouth daily    calcium-vitamin D (OSCAL-500) 500-400 MG-UNIT per tablet Take 1 tablet by mouth 2 (two) times daily    azelastine (ASTELIN) 0.1 % nasal spray 1 spray by Each Nostril route 2 (two) times daily Use in each nostril as directed    acetaminophen (TYLENOL) 500 MG tablet Take 1 tablet by mouth every 6 (six) hours as needed for Pain     No current facility-administered medications for this visit.           VITAL SIGNS:   There were  no vitals filed for this visit.     LABS:    WHITE BLOOD CELL COUNT (TH/uL)   Date Value   04/10/2019 5.8     RED BLOOD CELL COUNT (M/uL)   Date Value   04/10/2019 4.14     HEMOGLOBIN (g/dL)   Date Value   04/10/2019 12.2     No results found for: IDEALHCT  HEMATOCRIT (%)   Date Value   04/10/2019 37.5     No results found for: HCTDIFF  MEAN CORPUSCULAR VOL (fl)   Date Value   04/10/2019 90.6     MEAN CORPUSCULAR HGB (pg)   Date Value   04/10/2019 29.5     MEAN CORP HGB CONC (g/dL)   Date Value   04/10/2019 32.5     No results found for: RDW  PLATELET COUNT (TH/uL)   Date Value   04/10/2019 259     MEAN PLATELET VOLUME (fL)   Date Value   04/10/2019 10.7     NEUTROPHIL % (%)   Date Value   04/10/2019 56.9     LYMPHOCYTE % (%)   Date Value   04/10/2019 34.9     MONOCYTE % (%)   Date Value    04/10/2019 5.3     EOSINOPHIL % (%)   Date Value   04/10/2019 2.1     BASOPHIL % (%)   Date Value   04/10/2019 0.5           ALBUMIN (g/dL)   Date Value   04/10/2019 3.8     ALKALINE PHOSPHATASE (U/L)   Date Value   04/10/2019 117     ALANINE AMINOTRANSFERASE (U/L)   Date Value   04/10/2019 30     ASPARTATE AMINOTRANSFERASE (U/L)   Date Value   04/10/2019 21     Glucose Random (mg/dL)   Date Value   07/20/2019 99     BUN (UREA NITROGEN) (mg/dL)   Date Value   07/20/2019 17     CALCIUM (mg/dL)   Date Value   07/20/2019 9.0     CHLORIDE (mmol/L)   Date Value   07/20/2019 104     CARBON DIOXIDE (mmol/L)   Date Value   07/20/2019 30     CREATININE (mg/dL)   Date Value   07/20/2019 1.3 (H)     POTASSIUM (mmol/L)   Date Value   07/20/2019 3.9     SODIUM (mmol/L)   Date Value   07/20/2019 140     BILIRUBIN TOTAL (mg/dL)   Date Value   04/10/2019 0.3     TOTAL PROTEIN (g/dL)   Date Value   04/10/2019 7.4         THYROID SCREEN TSH REFLEX FT4   Date Value Ref Range Status   07/20/2019 4.710 (H) 0.358 - 3.740 uIU/mL Final     Comment:     **As per the Thyroid Screen protocol, an FT4 test is  automatically ordered for all patients with abnormal TSH  results unless the FT4 has already been performed on this  patient within the last 168 hours.        Cholesterol (mg/dL)   Date Value   04/10/2019 223     LOW DENSITY LIPOPROTEIN DIRECT (mg/dL)   Date Value   04/10/2019 159     HIGH DENSITY LIPOPROTEIN (mg/dL)   Date Value   04/10/2019 43     TRIGLYCERIDES (mg/dL)   Date Value   04/10/2019 169 (H)  HEMOGLOBIN A1C (%)   Date Value   07/20/2019 5.9 (H)     No results found for: POCA1C    RPR QUAL   Date Value Ref Range Status   07/20/2019 Non Reactive Non Reactive Final     Comment:     Performed at:  [facility]  [address], [place], [place]  [id number]  Lab Director: Allyn Kenner MD, Phone:  [phone]       No results found for: HIVAB  No results found for: VAL  No results found for: CLOZ     Pregnancy/Birth  Control (for female patients): N/A    CURRENT TREATMENT/CONTACT INFO FOR OTHER AGENCIES AND [place] (if applicable):    Contact/Community Support    No documentation.         COLUMBIARISK ASSESSMENTS:   Suicide:  C-SSRS OP Last Contact Screener - 08/16/19 1542        C-SSRS OP Last Contact Screener    Have you wished you were dead or wished you could go to sleep and not wake up?  No     Have you actually had any thoughts of killing yourself?   No     Have you done anything, started to do anything, or prepared to do anything to end your life?  No     C-SSRS OP Last Contact Screener Risk Score  No Risk         C-SSRS Risk Assessment    No documentation.         VIOLENCE:   Violence/Abuse Risk    No documentation.          MENTAL STATUS EXAMINATION:  Pleasant; speech-nl rate/tone; attent/conc-nl; mood-"better, considerably better"; depression-"better, I don't cry"; anxiety-"not like it was before"; panic-no; irritability/impatience-"a little when the children scream"; anger-no; anhedonia-no; sleep-to bed at 11P-12AM-initial insomnia-"sometimes", wakings-for BR, EMA-no, difficulty w/ OOB and facing the day-up at 9-10 AM; appetite-nl; energy-nl motivation-nl; isol/withdr-no; fatigue-naps; hopelessness-no; guilt-no; no-SI/HI. Coherent but often tangential and needing constant refocus;thought process/associations-fairly organized; thought content-no overt psychosis; fund of knowledge-grossly intact; memory-grossly intact; racing thoughts-no; rumination-no; worry-"about my health"; no-AH,VH,CH,OH,TH;paranoia-no;delusions-no; IOR-no; thought broadcasting/insertion/removal-no; A+Ox3. I/J-nl      RISK ASSESSMENTS:     Violence: low (1)     Addiction: low (1)     BIO/PSYCHO/SOCIAL AND RISK FORMULATION(S): 65 y/o [place] female with longstanding MDD, recurrent, GAD, and Panic D/O now in need of psychopharm follow up,and counseling though she has dropped out of same in the past.    DIAGNOSES:    MDD,  recurrent, in partial remission; GAD; Panic D/O    PLAN:Continue meds as ordered. Next appt-6/16  16Effexor XR to 187.5 mg AM   Klonopin 1 mg twice daily, PRN - takes twice daily   .    Rozerem 8 mg HS - Rx'd by PCP  Trazodone 100 mg HS - D/C'd by PCP     INFORMED CONSENT - for any new medication:   N/A     CARE PLAN/ EPISODES:  Linked Episodes   Type: Episode: Status: Noted: Resolved: Last update: Updated by:   [place] Active 08/16/2019  08/16/2019  3:39 PM Felix Pacini, RNCS      Comments:       COUNSELING AND COORDINATION OF CARE PROVIDED:   I spent a total of 40 minutes on this visit on the date of service (total time includes all activities performed on the date of service)  PSYCHOTHERAPY PROVIDED:none    Felix Pacini, RNCS

## 2020-09-16 ENCOUNTER — Ambulatory Visit
Admission: RE | Admit: 2020-09-16 | Discharge: 2020-09-16 | Disposition: A | Discharge status: Home / Self Care | Payer: No Typology Code available for payment source | Attending: Internal Medicine | Admitting: Internal Medicine

## 2020-09-16 ENCOUNTER — Other Ambulatory Visit: Payer: Self-pay

## 2020-09-16 DIAGNOSIS — R002 Palpitations: Secondary | ICD-10-CM | POA: Diagnosis present

## 2020-09-16 NOTE — Progress Notes (Signed)
Pt. Arrived to the stress lab.   Test explained and consent obtained.   Brief history taken with no active chest pain.  No history of asthma and lung sounds clear upon assessment.   Vitals WNL for patient.   Exercise stress test started.   No chest pain throughout test.   Completed test without complications. Ended test d/t knee pain and dyspnea.   Pt. Was observed for 5 minutes and left the stress lab in stable condition.   Please see Stress Report in Cardiopulmonary tab.     Randon Goldsmith, 09/16/2020

## 2020-09-25 MED FILL — CLONAZEPAM 1MG: 30 days supply | Qty: 60 | Fill #0 | Status: CP

## 2020-10-08 ENCOUNTER — Other Ambulatory Visit: Payer: Self-pay

## 2020-10-08 ENCOUNTER — Ambulatory Visit (HOSPITAL_BASED_OUTPATIENT_CLINIC_OR_DEPARTMENT_OTHER)
Admission: RE | Admit: 2020-10-08 | Discharge: 2020-10-08 | Disposition: A | Discharge status: Home / Self Care | Payer: No Typology Code available for payment source | Source: Ambulatory Visit

## 2020-10-08 ENCOUNTER — Ambulatory Visit
Admission: RE | Admit: 2020-10-08 | Discharge: 2020-10-08 | Disposition: A | Discharge status: Home / Self Care | Payer: No Typology Code available for payment source | Attending: Internal Medicine | Admitting: Internal Medicine

## 2020-10-08 ENCOUNTER — Other Ambulatory Visit (HOSPITAL_BASED_OUTPATIENT_CLINIC_OR_DEPARTMENT_OTHER): Payer: Self-pay | Admitting: Internal Medicine

## 2020-10-08 DIAGNOSIS — R002 Palpitations: Secondary | ICD-10-CM | POA: Insufficient documentation

## 2020-10-08 LAB — ECHOCARDIOGRAM W/ ULTRASOUND CONTRAST: LVEF: 60 %

## 2020-10-08 MED ORDER — PERFLUTREN DILUTED IV BOLUS
1.0000 mL | Status: DC
Start: 2020-10-08 — End: 2020-10-09

## 2020-10-08 MED ORDER — PERFLUTREN PROTEIN A MICROSPH IV SUSP
INTRAVENOUS | Status: DC
Start: 2020-10-08 — End: 2020-10-08
  Filled 2020-10-08: qty 3

## 2020-10-08 MED ORDER — PERFLUTREN PROTEIN A MICROSPH IV SUSP
INTRAVENOUS | Status: AC
Start: 2020-10-08 — End: 2020-10-08
  Administered 2020-10-08: 7 mL via INTRAVENOUS
  Filled 2020-10-08: qty 3

## 2020-10-10 ENCOUNTER — Other Ambulatory Visit (HOSPITAL_BASED_OUTPATIENT_CLINIC_OR_DEPARTMENT_OTHER): Payer: Self-pay | Admitting: "Psychiatric/Mental Health

## 2020-10-10 NOTE — Telephone Encounter (Signed)
PER pharmacy, Sara Snyder is a 65 year old female has requested a refill of effexor xr.      Last Office Visit: 09/12/20 with Bonita Quin lombardi  Last Physical Exam: 06/13/20      Other Med Adult:  Most Recent BP Reading(s)  07/25/20 : (!) 159/83        Cholesterol (mg/dL)   Date Value   24/38/3654 215     LOW DENSITY LIPOPROTEIN DIRECT (mg/dL)   Date Value   27/15/6648 141     HIGH DENSITY LIPOPROTEIN (mg/dL)   Date Value   30/32/2019 43     TRIGLYCERIDES (mg/dL)   Date Value   92/41/5516 148         THYROID SCREEN TSH REFLEX FT4 (uIU/mL)   Date Value   01/24/2020 5.010 (H)         No results found for: TSH    HEMOGLOBIN A1C (%)   Date Value   06/13/2020 6.2 (H)       No results found for: POCA1C      No results found for: INR    SODIUM (mmol/L)   Date Value   06/13/2020 139       POTASSIUM (mmol/L)   Date Value   06/13/2020 3.8           CREATININE (mg/dL)   Date Value   14/43/2469 1.2       Documented patient preferred pharmacies:    [facility] [place] [place] - [address]  Phone: [phone] Fax: [phone]

## 2020-10-11 ENCOUNTER — Other Ambulatory Visit (HOSPITAL_BASED_OUTPATIENT_CLINIC_OR_DEPARTMENT_OTHER): Payer: Self-pay | Admitting: "Psychiatric/Mental Health

## 2020-10-11 ENCOUNTER — Encounter (HOSPITAL_BASED_OUTPATIENT_CLINIC_OR_DEPARTMENT_OTHER): Payer: Self-pay | Admitting: "Psychiatric/Mental Health

## 2020-10-11 MED ORDER — VENLAFAXINE HCL ER 37.5 MG PO CP24
ORAL_CAPSULE | ORAL | 2 refills | Status: DC
Start: 2020-10-11 — End: 2021-01-14

## 2020-10-11 MED ORDER — VENLAFAXINE HCL ER 150 MG PO CP24
ORAL_CAPSULE | ORAL | 2 refills | Status: DC
Start: 2020-10-11 — End: 2021-01-14

## 2020-10-11 MED FILL — VENLAFAXINE 150MG ER: 30 days supply | Qty: 30 | Fill #0

## 2020-10-11 MED FILL — VENLAFAXINE  37.5MG ER: 30 days supply | Qty: 30 | Fill #0

## 2020-10-14 ENCOUNTER — Encounter (HOSPITAL_BASED_OUTPATIENT_CLINIC_OR_DEPARTMENT_OTHER): Payer: Self-pay | Admitting: "Psychiatric/Mental Health

## 2020-10-14 ENCOUNTER — Encounter (HOSPITAL_BASED_OUTPATIENT_CLINIC_OR_DEPARTMENT_OTHER): Payer: Self-pay | Admitting: Internal Medicine

## 2020-10-15 ENCOUNTER — Ambulatory Visit: Payer: No Typology Code available for payment source | Attending: Internal Medicine | Admitting: Internal Medicine

## 2020-10-15 DIAGNOSIS — I10 Essential (primary) hypertension: Secondary | ICD-10-CM | POA: Diagnosis present

## 2020-10-15 DIAGNOSIS — R002 Palpitations: Secondary | ICD-10-CM | POA: Insufficient documentation

## 2020-10-15 NOTE — Progress Notes (Signed)
CARDIOLOGY TELEVISIT PROGRESS NOTE  Date of visit: 10/15/2020    Call dau Sara Snyder at [phone] # -- needs [place] terp.    65 y/o [place] mother of 5 and GM of 3, today  acc'd and interpreted by her dau  Sara Snyder, cell [phone].  But [name] asks for tele terp, even though her English is good.  So went with the tele, even though the interp will not be as good as a dau who knows her.    In the past was acc'd and interpreted by other dauAna Elyn Snyder, cell [phone],  Pt resides with her husband and one dau and one grandchild.          Confirm Patient Identity Sara Snyder ; [birth year])  In summary patient is 65 year old female with the following medical problems:  Essential hypertension   Palpitations    Diastolic dysfunction on ECHO       She was referred by Dr. Raynelle Fanning  for concerns of SVT and VPC's  on Holter of Sept 2020.  Marland Kitchen PMH includes hypertension, hyperlipidemia.    Holter and echo Sept 2020 revealed grade 2 diastolic dysfunction.  Her PCP Dr. Raynelle Fanning referred for for eval of the Holter findings of 7 short runs of SVT but no VT.    Hx includes palpitation with panic attacks 1-2x/year for many years.  In the past she c/o chest pain near her right breast, but not now.  C/o SOB both at rest and with exertion -- c/o SOB while I'm interviewing her but she is not tachypneic with no evidence of distress.  The dyspnea appears to be supratentorial.    She says her breathing is worse since she had Covid Feb 2021. Walked here from the main entrance of the hospital.sin para. Says trouble with breath with walking x years.    Then says she is limited by bilateral knee pain.      Nonexertional. BP  has been labile with SBP from 90 -190.  She has lightheadedness with bending down. No syncope.  She feels extremely fatigue and SOB with minimal housework. No regular exercise during the pandemic.      Current Outpatient Medications:     venlafaxine (EFFEXOR-XR) 150 MG 24 hr capsule, 1 capsule AM in addition to 37.5  mg-total=187.5 mg, Disp: 30 capsule, Rfl: 2    venlafaxine (EFFEXOR-XR) 37.5 MG 24 hr capsule, 1 capsule in addition to 150 mg-total=187.5 mg, Disp: 30 capsule, Rfl: 2    clonazePAM (KLONOPIN) 1 MG tablet, 1 tablet twice daily, PRN, Disp: 60 tablet, Rfl: 2    Calcium Carb-Cholecalciferol 500-400 MG-UNIT TABS, Take 1 tablet by mouth daily with lunch, Disp: 90 tablet, Rfl: 3    azelastine (ASTELIN) 0.1 % nasal spray, 1 spray by Each Nostril route 2 (two) times daily Use in each nostril as directed, Disp: 30 mL, Rfl: 3    cyclobenzaprine (FLEXERIL) 5 MG tablet, Take 1 tablet by mouth nightly, Disp: 20 tablet, Rfl: 0    lidocaine (LIDODERM) 5 % patch, Place 1 patch onto the skin daily, Disp: 30 patch, Rfl: 2    omeprazole (PRILOSEC) 20 MG capsule, Take 1 capsule by mouth daily, Disp: 30 capsule, Rfl: 5    cholecalciferol (VITAMIN D3) 2000 UNIT tablet, Take 1 tablet by mouth 2 (two) times daily, Disp: 180 tablet, Rfl: 3    amlodipine-valsartan (EXFORGE) 5-320 MG per tablet, Take 1 tablet by mouth daily, Disp: 90 tablet, Rfl: 2    ramelteon (  ROZEREM) 8 MG tablet, Take 1 tablet by mouth nightly, Disp: 30 tablet, Rfl: 3    atenolol (TENORMIN) 100 MG tablet, Take 1 tablet by mouth nightly, Disp: 90 tablet, Rfl: 3    chlorthalidone (HYGROTEN) 25 MG tablet, Take 1 tablet by mouth daily, Disp: 90 tablet, Rfl: 3    Multiple Vitamins-Minerals (HAIR SKIN AND NAILS FORMULA) TABS, Take 1 capsule by mouth daily, Disp: 30 tablet, Rfl: 12    Review of Patient's Allergies indicates:   Pollen extract          Cough    SOCIAL & FAMILY HISTORY: No fam hx heart dis. No substances.    Review of systems: All other systems reviewed were pertinently negative apart from the history of present illness.    PHYSICAL EXAM:      PHYSICAL EXAM: 25 Jun 2020:  15976.  80.  Afeb.  99%.  4'11", 192 lbs, BMI 39.           Oct 2020:   170/84.  71.  Afeb.  97%.  5\' 0" ,. 187 lbs, BMIO 37.      General:  Alert and comfortable.  There were no  vitals filed for this visit.  HEENT: Ocular muscles are intact and the face is symmetric with midline tongue.  NECK:  No palpable cervical nodes or thyroid, with no JVD.  CHEST: Clear to auscultation, no crackles or rhonchi.  HEART: Regular with no  murmurs, clicks, or gallops/  ABDOMEN: No palpable organs, masses, or tenderness.  EXTREMITIES: No rash, clubbing, or cyanosis, with no edema.  NEURO: A&O X 3, no obvious motor or sensory deficits  PSYCHIATRIC: Mood and affect are appropriate.  SKIN: No rash      PERTINENT INVESTIGATIONS:    1.  EKG  (on my personal review)             May 2021:  NSR 61.  Very low amp P's -- V1 best seen.  Normal.               Jan 2022:  NSR 72.  LVH voltage.  Normal ST-T's.        2. LABS:   Lab Results   Component Value Date    NA 139 06/13/2020    K 3.8 06/13/2020    CL 104 06/13/2020    CO2 30 06/13/2020    BUN 22 (H) 06/13/2020    CREAT 1.2 06/13/2020    GLUCOSER 101 06/13/2020     Lab Results   Component Value Date    LDL 141 06/13/2020    HDL 43 06/13/2020    TG 148 06/13/2020     No results found for: PROBNP  No results found for: BNP    3. ECHOCARDIOGRAM            September 2020:                    60%. No RWMA's. Gr 2 DD. Asc ao 37 mm.Marland Kitchen.               April 2022:  60%.  Gr 2 DD.  Mild AI. Mild asc ao ectasia 40 mm.         4. STRESS TEST:            March 2022:  3 minutes, stopped for SOB & knee pain.  82% HR.  DP 22.  5 METS.  20% decr ex cap.   No CP or dangerous arrhythmia.  But 1 mm flat ST in                 inflat leads, 1st min, subside 11 min recovery.                           5. HOLTER          Sept 2020:       NSR 70, 52 - 100.  600 APC = 0.6T, 15 AC, 7 short      slow SVT.600 VPC - 0.6%, 2 VC, no VT.  No sxs.           April 2022:  73, 54 - 121.  142 APC's:  4 AC, one 5-beat run @ 130 BPM.              500 VPC's -- no VC or VT.  No sx.         ASSESSMENT/ PLAN:    (R00.2) Palpitations  (primary encounter diagnosis)  Comment:  Holter above Sept 2020  with 600 APC and 600 VPC with 7 short runs SVT, but no VT.      Was already on atenolol.  No further treatment or work-up is indicated given the small burden of VPCs in a patient with no wall motion abnormality on echo and chronic mild symptoms.    Reports palps with anxiety.  Becomes tearful, dau trying to help her answer questions.  Palps -- feels like heart is beating faster -- lies down until it subsides over an hour.  Occ takes Ativan for it.  No falling.  + vertigo hx. -- "labyrinthitis". - could last a week at a time.  "Head feels bad", can last hours, all day long.  Does not sound like cardiac palps.  No further assessment unless spontaneous hemodynamic c/o's.  She tends to respond affirmatively to virtually all questions re sxs, so better to await spontaneous c/o's    April 2022:      CHEST PRESSURE  May 2021 reports precordial pressure -- "anguish or pressure -- tightness".  Goes off on tangent about meds, depression, bones, etc etc.  Cautioned to answer questions explicitly.  Dau assists -- twice per day, lasting hours.  No radiation.  Can be severe.  Loquacious -- very poor historian -- doesn't address questions, rambles.  With and without exertion,  Esp when sad -- can't say if assoc'd  with panic attacks.  No assoc'd diaphoresis, + nausea, some.  I advised her that it doesn't sound cardiac.    Negligible exercise.  Most vigorous activity is cooking.  No stairs.  Pillows x 2.  Nocturia x 2.  Exam negative.  Palps primarily assoc'd with anxiety --> unlikely to be pathologic.     (I49.3) PVC (premature ventricular contraction)  Comment: Same as above.    (I10) Essential hypertension  Comment: Labile blood pressure.  She is on chlorthalidone, atenolol and valsartan.  Asked patient to record blood pressure twice a day and reevaluate with in person visit in a month.  17 May:  She didn't do it -- brings the wrist monitor but no written numbers -- instructed to prepare 60 BP's with left column of AM's and  right column of PM's.  Jan 2022 did not bring diary.  Seems that it must not be so important to her.    CV Rx Jan 2022:  Norvasc  5.  Valsartan 320.   Chlorthalidone 25.      (E78.5) Hyperlipidemia, unspecified hyperlipidemia type  Comment: 10 year ASCVD risk 12.1%. Will qualify asprin and statin treatment for primary ASCVD prevention.  Wanted to defer Rx.    My impression is that of non-cardiac SOB and chest pressure -- hours, non-exertional, + with emotions.  She has negligible palps - had no sxs during the Holter.    I'm going to leave the BP issue to Dr. Raynelle Fanning -- instructed to prepare the BP diary and take it to her.  The issue of palps is not of concern, nor is the chest pressure.  Will try treadmill stress -- she promises to work hard on the treadmill -- trying to quantitate her exercise.    I'll leave BP control to Dr. Raynelle Fanning -- can't do much with her declining to bring BP diary.  April 2022 usually 110/80.    Repeat Holter.  Get treadmill.  Echo was essentially normal except for grade 2 DD.  But we've changed our criteria for DD, so will repeat it now.    Echo no change except mild asc ao ectasia.  Recheck 2-3 years.  Holter with benign ectopy.    Stress test was done to bring out ex-induced arrhythmias, but as luck would have it, she dropped her ST segments -- very likely false +, but I'm forced to go with [facility].    Televisit the week after.

## 2020-10-25 ENCOUNTER — Other Ambulatory Visit: Payer: Self-pay

## 2020-10-29 ENCOUNTER — Encounter (HOSPITAL_BASED_OUTPATIENT_CLINIC_OR_DEPARTMENT_OTHER): Payer: Self-pay | Admitting: Internal Medicine

## 2020-10-29 DIAGNOSIS — R1013 Epigastric pain: Secondary | ICD-10-CM

## 2020-10-29 DIAGNOSIS — K219 Gastro-esophageal reflux disease without esophagitis: Secondary | ICD-10-CM

## 2020-10-29 DIAGNOSIS — R140 Abdominal distension (gaseous): Secondary | ICD-10-CM

## 2020-10-29 NOTE — Telephone Encounter (Signed)
PER Patient (self), Sara Snyder is a 65 year old female has requested a refill of Omeprazole.    90 day supply request    Last Office Visit: 09/10/20 with Bat[place]  Last Physical Exam: 06/13/20    FECAL OCCULT BLOOD AGE 66+ due on 12/03/2018    Other Med Adult:  Most Recent BP Reading(s)  07/25/20 : (!) 159/83        Cholesterol (mg/dL)   Date Value   03/49/1791 215     LOW DENSITY LIPOPROTEIN DIRECT (mg/dL)   Date Value   50/56/9794 141     HIGH DENSITY LIPOPROTEIN (mg/dL)   Date Value   80/16/5537 43     TRIGLYCERIDES (mg/dL)   Date Value   48/27/0786 148         THYROID SCREEN TSH REFLEX FT4 (uIU/mL)   Date Value   01/24/2020 5.010 (H)         No results found for: TSH    HEMOGLOBIN A1C (%)   Date Value   06/13/2020 6.2 (H)       No results found for: POCA1C      No results found for: INR    SODIUM (mmol/L)   Date Value   06/13/2020 139       POTASSIUM (mmol/L)   Date Value   06/13/2020 3.8           CREATININE (mg/dL)   Date Value   75/44/9201 1.2       Documented patient preferred pharmacies:    [facility] [place] [place] - [address]  Phone: [phone] Fax: [phone]    [facility] [place] - [address]  Phone: [phone] Fax: [phone]

## 2020-10-30 MED ORDER — OMEPRAZOLE 20 MG PO CPDR
20.0000 mg | DELAYED_RELEASE_CAPSULE | Freq: Every day | ORAL | 3 refills | Status: DC
Start: 2020-10-30 — End: 2021-09-05

## 2020-10-30 MED FILL — OMEPRAZOLE   20MG: 90 days supply | Qty: 90 | Fill #0

## 2020-11-12 ENCOUNTER — Encounter (HOSPITAL_BASED_OUTPATIENT_CLINIC_OR_DEPARTMENT_OTHER): Payer: Self-pay | Admitting: Internal Medicine

## 2020-11-12 DIAGNOSIS — H8309 Labyrinthitis, unspecified ear: Secondary | ICD-10-CM

## 2020-11-12 NOTE — Telephone Encounter (Signed)
Message from MyChart:   From: Nadean Corwin   Sent: Tue Nov 12, 2020 4:22 PM   To: Centralized Refill Pool  Subject: Medication Renewal Request  Refills have been requested for the following medications:   Other - Good afternoon, my mother needs the Meclizine medication she uses for dizziness. hers is over. I came to try a reorder but it's not here on the list.    Preferred pharmacy: [facility] [place], [place] - [address]    This message is being sent by [name] on behalf of Sara Snyder

## 2020-11-12 NOTE — Telephone Encounter (Signed)
PER Daughter, Sara Snyder is a 65 year old female has requested a refill of meclizine 25.      Last Office Visit: 09-10-20 with pcp  Last Physical Exam: 06-13-20    FECAL OCCULT BLOOD AGE 42+ due on 12/03/2018    Other Med Adult:  Most Recent BP Reading(s)  07/25/20 : (!) 159/83        Cholesterol (mg/dL)   Date Value   24/19/9144 215     LOW DENSITY LIPOPROTEIN DIRECT (mg/dL)   Date Value   45/84/8350 141     HIGH DENSITY LIPOPROTEIN (mg/dL)   Date Value   75/73/2256 43     TRIGLYCERIDES (mg/dL)   Date Value   72/02/1979 148         THYROID SCREEN TSH REFLEX FT4 (uIU/mL)   Date Value   01/24/2020 5.010 (H)         No results found for: TSH    HEMOGLOBIN A1C (%)   Date Value   06/13/2020 6.2 (H)       No results found for: POCA1C      No results found for: INR    SODIUM (mmol/L)   Date Value   06/13/2020 139       POTASSIUM (mmol/L)   Date Value   06/13/2020 3.8           CREATININE (mg/dL)   Date Value   22/17/9810 1.2       Documented patient preferred pharmacies:    [facility] [place], [place] - [address]  Phone: [phone] Fax: [phone]

## 2020-11-13 MED ORDER — MECLIZINE HCL 25 MG PO TABS
25.0000 mg | ORAL_TABLET | Freq: Three times a day (TID) | ORAL | 2 refills | Status: DC | PRN
Start: 2020-11-13 — End: 2021-12-08

## 2020-11-13 MED FILL — MECLIZINE 25MG: 30 days supply | Qty: 90 | Fill #0 | Status: CP

## 2020-11-13 MED FILL — ATENOLOL 100MG: 90 days supply | Qty: 90 | Fill #1 | Status: CP

## 2020-11-13 MED FILL — VENLAFAXINE  37.5MG ER: 30 days supply | Qty: 30 | Fill #1 | Status: CP

## 2020-11-13 MED FILL — VENLAFAXINE 150MG ER: 30 days supply | Qty: 30 | Fill #1 | Status: CP

## 2020-11-19 ENCOUNTER — Ambulatory Visit (HOSPITAL_BASED_OUTPATIENT_CLINIC_OR_DEPARTMENT_OTHER): Admission: RE | Admit: 2020-11-19 | Payer: No Typology Code available for payment source | Source: Ambulatory Visit

## 2020-11-26 ENCOUNTER — Encounter (HOSPITAL_BASED_OUTPATIENT_CLINIC_OR_DEPARTMENT_OTHER): Payer: Self-pay | Admitting: Ophthalmology

## 2020-11-26 ENCOUNTER — Ambulatory Visit: Payer: No Typology Code available for payment source | Attending: Ophthalmology | Admitting: Ophthalmology

## 2020-11-26 ENCOUNTER — Other Ambulatory Visit: Payer: Self-pay

## 2020-11-26 DIAGNOSIS — H40003 Preglaucoma, unspecified, bilateral: Secondary | ICD-10-CM | POA: Diagnosis present

## 2020-11-26 DIAGNOSIS — H524 Presbyopia: Secondary | ICD-10-CM | POA: Diagnosis present

## 2020-11-26 DIAGNOSIS — H5203 Hypermetropia, bilateral: Secondary | ICD-10-CM | POA: Insufficient documentation

## 2020-11-26 DIAGNOSIS — R6889 Other general symptoms and signs: Secondary | ICD-10-CM

## 2020-11-26 DIAGNOSIS — H2513 Age-related nuclear cataract, bilateral: Secondary | ICD-10-CM | POA: Insufficient documentation

## 2020-11-26 DIAGNOSIS — H52203 Unspecified astigmatism, bilateral: Secondary | ICD-10-CM | POA: Insufficient documentation

## 2020-11-26 NOTE — Progress Notes (Signed)
For evaluation of blurred vision OU    Impression,    1.mild cataract-still had good corrected vision    Plan-Observation    2. Hyperopia, astigmatism, presbyopia-    Plan-she was refracted and given a prescription to get glasses that she can fill at any optical shop.    3.Glaucoma suspect based on generous C/D    IOP was 15 OU which is not elevated    Disc OCT 03/29/2018 revealed an average retinal NFL thickness of 77 microns OD with a C/D of 0.70 and 65 microns OD with a C/D of 0.76      Plan-HVF and disc OCT, RTC 6 months     4. Hx "retina problem"    Optos-No gross retinal pathology

## 2020-11-26 NOTE — Progress Notes (Signed)
65 years old female,    New patient.    C/O Blurry vision OU at distance and near. No pain. No flashes or floaters.

## 2020-12-05 ENCOUNTER — Encounter (HOSPITAL_BASED_OUTPATIENT_CLINIC_OR_DEPARTMENT_OTHER): Payer: Self-pay | Admitting: "Psychiatric/Mental Health

## 2020-12-05 ENCOUNTER — Ambulatory Visit: Payer: No Typology Code available for payment source | Attending: Psychiatry | Admitting: "Psychiatric/Mental Health

## 2020-12-05 DIAGNOSIS — F3341 Major depressive disorder, recurrent, in partial remission: Secondary | ICD-10-CM | POA: Diagnosis present

## 2020-12-05 DIAGNOSIS — F411 Generalized anxiety disorder: Secondary | ICD-10-CM | POA: Diagnosis present

## 2020-12-05 DIAGNOSIS — F410 Panic disorder [episodic paroxysmal anxiety] without agoraphobia: Secondary | ICD-10-CM

## 2020-12-05 MED ORDER — CLONAZEPAM 1 MG PO TABS
ORAL_TABLET | ORAL | 2 refills | Status: DC
Start: 2020-12-05 — End: 2021-05-16

## 2020-12-05 NOTE — Progress Notes (Signed)
ADULT OUTPATIENT PSYCHIATRY (OPD)  PSYCHOPHARM FOLLOW UP    LANGUAGE OF CARE:    [place]      LANGUAGE NEEDS MET:    Telephone Interpreter    CHIEF COMPLAINT:     SUBJECTIVE DATA/INTERVAL HISTORY:   Sara Snyder is assessed in a psychopharm f/u televisit with a [place] interpreter.  "I'm watching my grandchildren while their parents work."   She reports she cooks, does chores, cares for grandchildren, and watches TV.   Reviewed meds:   Effexor 187.5 mg AM; Klonopin 1 mg BID, PRN(every night, and unable to say how many times used as a 2nd dose/week). Taking Rozerem 8 mg HS-takes nightly per .PCP.   Her knees still bothering her.  She is fully vaccinated; booster on 12/23.  She is tangential and needing refocus. reports she is "good"; "sometimes" depression; sadness-"my knees"; no irritability; worry, anhedonia, hopelessness, anger, guilt, racing thoughts, rumination, or SI. ; Energy, motivation, appetite, and sleep are normal. She is not hypomanic or overtly psychotic..   Med compliant.        OBJECTIVE DATA:   CURRENT MEDICATIONS:    Current Outpatient Medications   Medication Sig    traZODone (DESYREL) 50 MG tablet 1/2 - 1 tablet HS, PRN    fluticasone (FLONASE) 50 MCG/ACT nasal spray 1 spray by Each Nostril route daily    benzonatate (TESSALON) 100 MG capsule Take 1 capsule by mouth 3 (three) times daily as needed for Cough  for up to 20 days    venlafaxine (EFFEXOR-XR) 37.5 MG 24 hr capsule 1 tablet AM along with 75 mg-total = 112.5 mg    venlafaxine (EFFEXOR-XR) 75 MG 24 hr capsule Take 1 capsule by mouth daily Take along with 37.5 mg. TDD=112.5 mg.    valsartan (DIOVAN) 320 MG tablet Take 1 tablet by mouth daily    clonazePAM (KLONOPIN) 1 MG tablet 1 tablet twice daily as needed    chlorthalidone (HYGROTEN) 50 MG tablet Take 1 tablet by mouth daily    atenolol (TENORMIN) 50 MG tablet One tab in am and one tab in pm    omeprazole (PRILOSEC) 20 MG capsule Take 1 capsule by mouth daily     meclizine (ANTIVERT) 25 MG TABS Take 1 tablet by mouth every 8 (eight) hours as needed    Multiple Vitamins-Minerals (HAIR SKIN AND NAILS FORMULA) TABS Take 1 capsule by mouth daily    loratadine (CLARITIN) 10 MG tablet Take 1 tablet by mouth daily    calcium-vitamin D (OSCAL-500) 500-400 MG-UNIT per tablet Take 1 tablet by mouth 2 (two) times daily    azelastine (ASTELIN) 0.1 % nasal spray 1 spray by Each Nostril route 2 (two) times daily Use in each nostril as directed    acetaminophen (TYLENOL) 500 MG tablet Take 1 tablet by mouth every 6 (six) hours as needed for Pain     No current facility-administered medications for this visit.           VITAL SIGNS:   There were no vitals filed for this visit.     LABS:    WHITE BLOOD CELL COUNT (TH/uL)   Date Value   04/10/2019 5.8     RED BLOOD CELL COUNT (M/uL)   Date Value   04/10/2019 4.14     HEMOGLOBIN (g/dL)   Date Value   04/10/2019 12.2     No results found for: IDEALHCT  HEMATOCRIT (%)   Date Value   04/10/2019 37.5     No  results found for: HCTDIFF  MEAN CORPUSCULAR VOL (fl)   Date Value   04/10/2019 90.6     MEAN CORPUSCULAR HGB (pg)   Date Value   04/10/2019 29.5     MEAN CORP HGB CONC (g/dL)   Date Value   04/10/2019 32.5     No results found for: RDW  PLATELET COUNT (TH/uL)   Date Value   04/10/2019 259     MEAN PLATELET VOLUME (fL)   Date Value   04/10/2019 10.7     NEUTROPHIL % (%)   Date Value   04/10/2019 56.9     LYMPHOCYTE % (%)   Date Value   04/10/2019 34.9     MONOCYTE % (%)   Date Value   04/10/2019 5.3     EOSINOPHIL % (%)   Date Value   04/10/2019 2.1     BASOPHIL % (%)   Date Value   04/10/2019 0.5           ALBUMIN (g/dL)   Date Value   04/10/2019 3.8     ALKALINE PHOSPHATASE (U/L)   Date Value   04/10/2019 117     ALANINE AMINOTRANSFERASE (U/L)   Date Value   04/10/2019 30     ASPARTATE AMINOTRANSFERASE (U/L)   Date Value   04/10/2019 21     Glucose Random (mg/dL)   Date Value   07/20/2019 99     BUN (UREA NITROGEN) (mg/dL)   Date Value    07/20/2019 17     CALCIUM (mg/dL)   Date Value   07/20/2019 9.0     CHLORIDE (mmol/L)   Date Value   07/20/2019 104     CARBON DIOXIDE (mmol/L)   Date Value   07/20/2019 30     CREATININE (mg/dL)   Date Value   07/20/2019 1.3 (H)     POTASSIUM (mmol/L)   Date Value   07/20/2019 3.9     SODIUM (mmol/L)   Date Value   07/20/2019 140     BILIRUBIN TOTAL (mg/dL)   Date Value   04/10/2019 0.3     TOTAL PROTEIN (g/dL)   Date Value   04/10/2019 7.4         THYROID SCREEN TSH REFLEX FT4   Date Value Ref Range Status   07/20/2019 4.710 (H) 0.358 - 3.740 uIU/mL Final     Comment:     **As per the Thyroid Screen protocol, an FT4 test is  automatically ordered for all patients with abnormal TSH  results unless the FT4 has already been performed on this  patient within the last 168 hours.        Cholesterol (mg/dL)   Date Value   04/10/2019 223     LOW DENSITY LIPOPROTEIN DIRECT (mg/dL)   Date Value   04/10/2019 159     HIGH DENSITY LIPOPROTEIN (mg/dL)   Date Value   04/10/2019 43     TRIGLYCERIDES (mg/dL)   Date Value   04/10/2019 169 (H)       HEMOGLOBIN A1C (%)   Date Value   07/20/2019 5.9 (H)     No results found for: POCA1C    RPR QUAL   Date Value Ref Range Status   07/20/2019 Non Reactive Non Reactive Final     Comment:     Performed at:  [facility]  [address], [place], [place]  [id number]  Lab Director: Allyn Kenner MD, Phone:  [phone]  No results found for: HIVAB  No results found for: VAL  No results found for: CLOZ     Pregnancy/Birth Control (for female patients): N/A    CURRENT TREATMENT/CONTACT INFO FOR OTHER AGENCIES AND [place] (if applicable):    Contact/Community Support    No documentation.         COLUMBIARISK ASSESSMENTS:   Suicide:  C-SSRS OP Last Contact Screener - 08/16/19 1542        C-SSRS OP Last Contact Screener    Have you wished you were dead or wished you could go to sleep and not wake up?  No     Have you actually had any thoughts of killing yourself?    No     Have you done anything, started to do anything, or prepared to do anything to end your life?  No     C-SSRS OP Last Contact Screener Risk Score  No Risk         C-SSRS Risk Assessment    No documentation.         VIOLENCE:   Violence/Abuse Risk    No documentation.          MENTAL STATUS EXAMINATION:  Pleasant; speech-nl rate/tone; attent/conc-nl; mood-"good"; depression-"sometimes"; anxiety-no; panic-no; irritability/impatience-no; sadness-yes, "my knees" anger-no; anhedonia-no; sleep-to bed at 12AM-1AM-initial insomnia-"sometimes", wakings-for BR, EMA-no, difficulty w/ OOB and facing the day-up at 6 AM; appetite-nl; energy-nl motivation-nl; isol/withdr-no; fatigue-naps; hopelessness-no; guilt-no; no-SI/HI. Coherent but often tangential and needing constant refocus; thought process/associations-fairly organized; thought content-no overt psychosis; fund of knowledge-grossly intact; memory-grossly intact; racing thoughts-no; rumination-no; worry-no; no-AH,VH,CH,OH,TH;paranoia-no;delusions-no; IOR-no; thought broadcasting/insertion/removal-no; A+Ox3. I/J-nl      RISK ASSESSMENTS:     Violence: low (1)     Addiction: low (1)     BIO/PSYCHO/SOCIAL AND RISK FORMULATION(S): 65 y/o [place] female with longstanding MDD, recurrent, GAD, and Panic D/O now in need of psychopharm follow up,and counseling though she has dropped out of same in the past.    DIAGNOSES:    MDD, recurrent, in partial remission; GAD; Panic D/O    PLAN: Continue meds as ordered. Next appt-9/8  Effexor XR 187.5 mg AM   Klonopin 1 mg twice daily, PRN - takes twice daily   .    Rozerem 8 mg HS - Rx'd by PCP  Trazodone 100 mg HS - D/C'd by PCP     INFORMED CONSENT - for any new medication:   N/A     CARE PLAN/ EPISODES:  Linked Episodes   Type: Episode: Status: Noted: Resolved: Last update: Updated by:   [place] Active 08/16/2019  08/16/2019  3:39 PM Felix Pacini, RNCS      Comments:       COUNSELING AND  COORDINATION OF CARE PROVIDED:   I spent a total of 40 minutes on this visit on the date of service (total time includes all activities performed on the date of service)      [place], RNCS

## 2020-12-06 MED FILL — AMLOD/VALSAR 5-320MG: 90 days supply | Qty: 90 | Fill #2 | Status: CP

## 2020-12-06 MED FILL — *CHLORTHALID 25MG: 90 days supply | Qty: 90 | Fill #2 | Status: CP

## 2020-12-06 MED FILL — CLONAZEPAM 1MG: 30 days supply | Qty: 60 | Fill #0 | Status: CP

## 2020-12-16 MED FILL — *VENLAFAXINE 150 MG XR: 30 days supply | Qty: 30 | Fill #2

## 2020-12-16 MED FILL — VENLAFAXINE  37.5MG ER: 30 days supply | Qty: 30 | Fill #2

## 2020-12-27 ENCOUNTER — Ambulatory Visit (HOSPITAL_BASED_OUTPATIENT_CLINIC_OR_DEPARTMENT_OTHER): Payer: No Typology Code available for payment source | Admitting: Internal Medicine

## 2020-12-27 ENCOUNTER — Other Ambulatory Visit: Payer: Self-pay

## 2020-12-27 NOTE — Progress Notes (Incomplete)
CARDIOLOGY TELEVISIT PROGRESS NOTE  Date of visit: 12/27/2020    Call dau Sara Snyder at [phone] # -- needs Port terp.    65 y/o [place] mother of 5 and GM of 3, today  acc'd and interpreted by her dau  Sara Snyder, cell [phone].  But Sara Snyder asks for tele terp, even though her English is good.  So went with the tele, even though the interp will not be as good as a dau who knows her.    In the past was acc'd and interpreted by other dauAna Elyn Snyder, cell [phone],  Pt resides with her husband and one dau and one grandchild.          Confirm Patient Identity Sara Snyder ; [birth year])  In summary patient is 65 year old female with the following medical problems:  Essential hypertension  Hyperlipidemia, unspecified hyperlipidemia type   Palpitations    Diastolic dysfunction on ECHO       She was referred by Dr. Raynelle Snyder  for concerns of SVT and VPC's  on Holter of Sept 2020.  Marland Kitchen PMH includes hypertension, hyperlipidemia.    Holter and echo Sept 2020 revealed grade 2 diastolic dysfunction.  Her PCP Dr. Raynelle Snyder referred for for eval of the Holter findings of 7 short runs of SVT but no VT.    Hx includes palpitation with panic attacks 1-2x/year for many years.  In the past she c/o chest pain near her right breast, but not now.  C/o SOB both at rest and with exertion -- c/o SOB while I'm interviewing her but she is not tachypneic with no evidence of distress.  The dyspnea appears to be supratentorial.    She says her breathing is worse since she had Covid Feb 2021. Walked here from the main entrance of the hospital.sin para. Says trouble with breath with walking x years.    Then says she is limited by bilateral knee pain.      Nonexertional. BP  has been labile with SBP from 90 -190.  She has lightheadedness with bending down. No syncope.  She feels extremely fatigue and SOB with minimal housework. No regular exercise during the pandemic.      Current Outpatient Medications:   .  clonazePAM (KLONOPIN) 1 MG tablet, 1  tablet twice daily, PRN, Disp: 60 tablet, Rfl: 2  .  meclizine (ANTIVERT) 25 MG TABS, Take 1 tablet by mouth every 8 (eight) hours as needed, Disp: 90 tablet, Rfl: 2  .  omeprazole (PRILOSEC) 20 MG capsule, Take 1 capsule by mouth daily, Disp: 90 capsule, Rfl: 3  .  venlafaxine (EFFEXOR-XR) 150 MG 24 hr capsule, 1 capsule AM in addition to 37.5 mg-total=187.5 mg, Disp: 30 capsule, Rfl: 2  .  venlafaxine (EFFEXOR-XR) 37.5 MG 24 hr capsule, 1 capsule in addition to 150 mg-total=187.5 mg, Disp: 30 capsule, Rfl: 2  .  Calcium Carb-Cholecalciferol 500-400 MG-UNIT TABS, Take 1 tablet by mouth daily with lunch, Disp: 90 tablet, Rfl: 3  .  azelastine (ASTELIN) 0.1 % nasal spray, 1 spray by Each Nostril route 2 (two) times daily Use in each nostril as directed, Disp: 30 mL, Rfl: 3  .  cyclobenzaprine (FLEXERIL) 5 MG tablet, Take 1 tablet by mouth nightly, Disp: 20 tablet, Rfl: 0  .  lidocaine (LIDODERM) 5 % patch, Place 1 patch onto the skin daily, Disp: 30 patch, Rfl: 2  .  cholecalciferol (VITAMIN D3) 2000 UNIT tablet, Take 1 tablet by mouth 2 (two) times daily,  Disp: 180 tablet, Rfl: 3  .  amlodipine-valsartan (EXFORGE) 5-320 MG per tablet, Take 1 tablet by mouth daily, Disp: 90 tablet, Rfl: 2  .  ramelteon (ROZEREM) 8 MG tablet, Take 1 tablet by mouth nightly, Disp: 30 tablet, Rfl: 3  .  atenolol (TENORMIN) 100 MG tablet, Take 1 tablet by mouth nightly, Disp: 90 tablet, Rfl: 3  .  chlorthalidone (HYGROTEN) 25 MG tablet, Take 1 tablet by mouth daily, Disp: 90 tablet, Rfl: 3  .  Multiple Vitamins-Minerals (HAIR SKIN AND NAILS FORMULA) TABS, Take 1 capsule by mouth daily, Disp: 30 tablet, Rfl: 12    Review of Patient's Allergies indicates:   Pollen extract          Cough    SOCIAL & FAMILY HISTORY: No fam hx heart dis. No substances.    Review of systems: All other systems reviewed were pertinently negative apart from the history of present illness.    PHYSICAL EXAM:      PHYSICAL EXAM: 25 Jun 2020:  [id number].  80.  Afeb.   99%.  4'11", 192 lbs, BMI 39.           Oct 2020:   170/84.  71.  Afeb.  97%.  5\' 0" ,. 187 lbs, BMIO 37.      General:  Alert and comfortable.  There were no vitals filed for this visit.  HEENT: Ocular muscles are intact and the face is symmetric with midline tongue.  NECK:  No palpable cervical nodes or thyroid, with no JVD.  CHEST: Clear to auscultation, no crackles or rhonchi.  HEART: Regular with no  murmurs, clicks, or gallops/  ABDOMEN: No palpable organs, masses, or tenderness.  EXTREMITIES: No rash, clubbing, or cyanosis, with no edema.  NEURO: A&O X 3, no obvious motor or sensory deficits  PSYCHIATRIC: Mood and affect are appropriate.  SKIN: No rash      PERTINENT INVESTIGATIONS:    1.  EKG  (on my personal review)             May 2021:  NSR 61.  Very low amp P's -- V1 best seen.  Normal.               Jan 2022:  NSR 72.  LVH voltage.  Normal ST-T's.        2. LABS:   Lab Results   Component Value Date    NA 139 06/13/2020    K 3.8 06/13/2020    CL 104 06/13/2020    CO2 30 06/13/2020    BUN 22 (H) 06/13/2020    CREAT 1.2 06/13/2020    GLUCOSER 101 06/13/2020     Lab Results   Component Value Date    LDL 141 06/13/2020    HDL 43 06/13/2020    TG 148 06/13/2020     No results found for: PROBNP  No results found for: BNP    3. ECHOCARDIOGRAM            September 2020:                    60%. No RWMA's. Gr 2 DD. Asc ao 37 mm.Marland Kitchen.               April 2022:  60%.  Gr 2 DD.  Mild AI. Mild asc ao ectasia 40 mm.         4. STRESS TEST:            March 2022:  3 minutes, stopped for SOB & knee pain.  82% HR.  DP 22.  5 METS.                  20% decr ex cap.   No CP or dangerous arrhythmia.  But 1 mm flat ST in                 inflat leads, 1st min,                 subside 11 min recovery.                           5. HOLTER          Sept 2020:       NSR 70, 52 - 100.  600 APC = 0.6T, 15 AC, 7 short      slow SVT.600 VPC - 0.6%, 2 VC, no VT.  No sxs.           April 2022:  73, 54 - 121.  142 APC's:  4 AC, one 5-beat run  @ 130 BPM.              500 VPC's -- no VC or VT.  No sx.         ASSESSMENT/ PLAN:    (R00.2) Palpitations  (primary encounter diagnosis)  Comment:  Holter above Sept 2020 with 600 APC and 600 VPC with 7 short runs SVT, but no VT.      Was already on atenolol.  No further treatment or work-up is indicated given the small burden of VPCs in a patient with no wall motion abnormality on echo and chronic mild symptoms.    Reports palps with anxiety.  Becomes tearful, dau trying to help her answer questions.  Palps -- feels like heart is beating faster -- lies down until it subsides over an hour.  Occ takes Ativan for it.  No falling.  + vertigo hx. -- "labyrinthitis". - could last a week at a time.  "Head feels bad", can last hours, all day long.  Does not sound like cardiac palps.  No further assessment unless spontaneous hemodynamic c/o's.  She tends to respond affirmatively to virtually all questions re sxs, so better to await spontaneous c/o's    Repeat Holter April 2022 per above:  Mild atrial and ventricular ectopy  NO SXS.    Repeat echo April 2022:  60%, no RWMA's.  Mild AI w PHT 570.          April 2022:      CHEST PRESSURE  May 2021 reports precordial pressure -- "anguish or pressure -- tightness".  Goes off on tangent about meds, depression, bones, etc etc.  Cautioned to answer questions explicitly.  Dau assists -- twice per day, lasting hours.  No radiation.  Can be severe.  Sara Snyder -- very poor historian -- doesn't address questions, rambles.  With and without exertion,  Esp when sad -- can't say if assoc'd  with panic attacks.  No assoc'd diaphoresis, + nausea, some.  I advised her that it doesn't sound cardiac.    Negligible exercise.  Most vigorous activity is cooking.  No stairs.  Pillows x 2.  Nocturia x 2.  Exam negative.  Palps primarily assoc'd with anxiety --> unlikely to be pathologic.     (I49.3) PVC (premature ventricular contraction)  Comment: Same as above.    (I10) Essential  hypertension  Comment: Labile blood pressure.  She is on chlorthalidone, atenolol and valsartan.  Asked patient to record blood pressure twice a day and reevaluate with in person visit in a month.  17 May:  She didn't do it -- brings the wrist monitor but no written numbers -- instructed to prepare 60 BP's with left column of AM's and right column of PM's.  Jan 2022 did not bring diary.  Seems that it must not be so important to her.    CV Rx Jan 2022:  Norvasc 5.  Valsartan 320.   Chlorthalidone 25.      (E78.5) Hyperlipidemia, unspecified hyperlipidemia type  Comment: 10 year ASCVD risk 12.1%. Will qualify asprin and statin treatment for primary ASCVD prevention.  Wanted to defer Rx.    My impression is that of non-cardiac SOB and chest pressure -- hours, non-exertional, + with emotions.  She has negligible palps - had no sxs during the Holter.    I'm going to leave the BP issue to Dr. Raynelle Snyder -- instructed to prepare the BP diary and take it to her.  The issue of palps is not of concern, nor is the chest pressure.  Will try treadmill stress -- she promises to work hard on the treadmill -- trying to quantitate her exercise.    I'll leave BP control to Dr. Raynelle Snyder -- can't do much with her declining to bring BP diary.  April 2022 usually 110/80.    Repeat Holter.  Get treadmill.  Echo was essentially normal except for grade 2 DD.  But we've changed our criteria for DD, so will repeat it now.    Echo no change except mild asc ao ectasia.  Recheck 2-3 years.  Holter with benign ectopy.    Stress test was done to bring out ex-induced arrhythmias, but as luck would have it, she dropped her ST segments -- very likely false +, but I'm forced to go with [facility].    Televisit the week after.

## 2021-01-13 ENCOUNTER — Ambulatory Visit: Payer: No Typology Code available for payment source | Admitting: Internal Medicine

## 2021-01-13 DIAGNOSIS — I10 Essential (primary) hypertension: Secondary | ICD-10-CM

## 2021-01-13 DIAGNOSIS — R0789 Other chest pain: Secondary | ICD-10-CM

## 2021-01-13 DIAGNOSIS — R002 Palpitations: Secondary | ICD-10-CM

## 2021-01-13 NOTE — Progress Notes (Signed)
CARDIOLOGY TELEVISIT PROGRESS NOTE  Date of visit: 01/13/2021     UNSUCCESSFUL ATTEMPT AT TELEVISIT -- NO ANSWER VIA DAUGHTER'S NUMBER WHEN INTERPRETER CALLED.  DID NOT CALL THE PATIENT BECAUSE THE NUCLEAR STRESS I WANTED DID NOT OCCUR -- RE-ORDERED.  RESCHED.        Call dau Sara Snyder at [phone] # -- needs [place] terp.    65 y/o [place] mother of 5 and GM of 3, today  acc'd and interpreted by her dau  Sara Snyder, cell [phone].  But [name] asks for tele terp, even though her English is good.  So went with the tele, even though the interp will not be as good as a dau who knows her.    In the past was acc'd and interpreted by other dauAna Elyn Snyder, cell [phone],  Pt resides with her husband and one dau and one grandchild.      Confirm Patient Identity Sara Snyder ; 27-May-1956)  In summary patient is 65 year old female with the following medical problems:  Essential hypertension   Palpitations    Diastolic Dysfunction Grade 2   SVT    She was referred by Dr. Raynelle Fanning  for concerns of SVT and VPC's  on Holter of Sept 2020.  PMH includes hypertension, hyperlipidemia.    Holter and echo Sept 2020 revealed grade 2 diastolic dysfunction.  Her PCP Dr. Raynelle Fanning referred for for eval of the Holter findings of 7 short runs of SVT but no VT.    Hx includes palpitation with panic attacks 1-2x/year for many years.  In the past she c/o chest pain near her right breast, but not now.  C/o SOB both at rest and with exertion -- c/o SOB while I'm interviewing her but she is not tachypneic with no evidence of distress.  The dyspnea appears to be supratentorial.    She says her breathing is worse since she had Covid Feb 2021. Walked here from the main entrance of the hospital.sin para. Says trouble with breath with walking x years.    Then says she is limited by bilateral knee pain.      Nonexertional. BP  has been labile with SBP from 90 -190.  She has lightheadedness with bending down. No syncope.  She feels extremely fatigue  and SOB with minimal housework. No regular exercise during the pandemic.      Current Outpatient Medications:     clonazePAM (KLONOPIN) 1 MG tablet, 1 tablet twice daily, PRN, Disp: 60 tablet, Rfl: 2    meclizine (ANTIVERT) 25 MG TABS, Take 1 tablet by mouth every 8 (eight) hours as needed, Disp: 90 tablet, Rfl: 2    omeprazole (PRILOSEC) 20 MG capsule, Take 1 capsule by mouth daily, Disp: 90 capsule, Rfl: 3    venlafaxine (EFFEXOR-XR) 150 MG 24 hr capsule, 1 capsule AM in addition to 37.5 mg-total=187.5 mg, Disp: 30 capsule, Rfl: 2    venlafaxine (EFFEXOR-XR) 37.5 MG 24 hr capsule, 1 capsule in addition to 150 mg-total=187.5 mg, Disp: 30 capsule, Rfl: 2    Calcium Carb-Cholecalciferol 500-400 MG-UNIT TABS, Take 1 tablet by mouth daily with lunch, Disp: 90 tablet, Rfl: 3    azelastine (ASTELIN) 0.1 % nasal spray, 1 spray by Each Nostril route 2 (two) times daily Use in each nostril as directed, Disp: 30 mL, Rfl: 3    cyclobenzaprine (FLEXERIL) 5 MG tablet, Take 1 tablet by mouth nightly, Disp: 20 tablet, Rfl: 0    lidocaine (LIDODERM) 5 % patch,  Place 1 patch onto the skin daily, Disp: 30 patch, Rfl: 2    cholecalciferol (VITAMIN D3) 2000 UNIT tablet, Take 1 tablet by mouth 2 (two) times daily, Disp: 180 tablet, Rfl: 3    amlodipine-valsartan (EXFORGE) 5-320 MG per tablet, Take 1 tablet by mouth daily, Disp: 90 tablet, Rfl: 2    ramelteon (ROZEREM) 8 MG tablet, Take 1 tablet by mouth nightly, Disp: 30 tablet, Rfl: 3    atenolol (TENORMIN) 100 MG tablet, Take 1 tablet by mouth nightly, Disp: 90 tablet, Rfl: 3    chlorthalidone (HYGROTEN) 25 MG tablet, Take 1 tablet by mouth daily, Disp: 90 tablet, Rfl: 3    Multiple Vitamins-Minerals (HAIR SKIN AND NAILS FORMULA) TABS, Take 1 capsule by mouth daily, Disp: 30 tablet, Rfl: 12    Review of Patient's Allergies indicates:   Pollen extract          Cough    SOCIAL & FAMILY HISTORY: No fam hx heart dis. No substances.    Review of systems: All other systems  reviewed were pertinently negative apart from the history of present illness.      PERTINENT INVESTIGATIONS:    1.  EKG  (on my personal review)             May 2021:  NSR 61.  Very low amp P's -- V1 best seen.  Normal.               Jan 2022:  NSR 72.  LVH voltage.  Normal ST-T's.        2. LABS:   Lab Results   Component Value Date    NA 139 06/13/2020    K 3.8 06/13/2020    CL 104 06/13/2020    CO2 30 06/13/2020    BUN 22 (H) 06/13/2020    CREAT 1.2 06/13/2020    GLUCOSER 101 06/13/2020     Lab Results   Component Value Date    LDL 141 06/13/2020    HDL 43 06/13/2020    TG 148 06/13/2020     No results found for: PROBNP  No results found for: BNP    3. ECHOCARDIOGRAM            September 2020:                    60%. No RWMA's. Gr 2 DD. Asc ao 37 mm.Marland Kitchen               April 2022:  60%.  Gr 2 DD.  Mild AI. Mild asc ao ectasia 40 mm.         4. STRESS TEST:            March 2022:  3 minutes, stopped for SOB & knee pain.  82% HR.  DP 22.  5                     METS.   20% decr ex cap.   No CP or dangerous arrhythmia.  But 1 mm flat                    ST in inflat leads, 1st min,  subside 11 min recovery.                           5. HOLTER          Sept 2020:  NSR 70, 52 - 100.  600 APC = 0.6T, 15 AC, 7 short      slow SVT.600 VPC - 0.6%, 2 VC, no VT.  No sxs.           April 2022:  73, 54 - 121.  142 APC's:  4 AC, one 5-beat run @ 130 BPM.              500 VPC's -- no VC or VT.  No sx.         ASSESSMENT/ PLAN:    (R00.2) Palpitations  (primary encounter diagnosis)  Comment:  Holter above Sept 2020 with 600 APC and 600 VPC with 7 short runs SVT, but no VT.      Was already on atenolol.  No further treatment or work-up is indicated given the small burden of VPCs in a patient with no wall motion abnormality on echo and chronic mild symptoms.    Reports palps with anxiety.  Becomes tearful, dau trying to help her answer questions.  Palps -- feels like heart is beating faster -- lies down until it subsides over an  hour.  Occ takes Ativan for it.  No falling.  + vertigo hx. -- "labyrinthitis". - could last a week at a time.  "Head feels bad", can last hours, all day long.  Does not sound like cardiac palps.  No further assessment unless spontaneous hemodynamic c/o's.  She tends to respond affirmatively to virtually all questions re sxs, so better to await spontaneous c/o's    Repeat Holter April 2022 per above:  Mild atrial and ventricular ectopy  NO SXS.    Repeat echo April 2022:  60%, no RWMA's.  Mild AI w PHT 570.          April 2022:      CHEST PRESSURE  May 2021 reports precordial pressure -- "anguish or pressure -- tightness".  Goes off on tangent about meds, depression, bones, etc etc.  Cautioned to answer questions explicitly.  Dau assists -- twice per day, lasting hours.  No radiation.  Can be severe.  Loquacious -- very poor historian -- doesn't address questions, rambles.  With and without exertion,  Esp when sad -- can't say if assoc'd  with panic attacks.  No assoc'd diaphoresis, + nausea, some.  I advised her that it doesn't sound cardiac.    Negligible exercise.  Most vigorous activity is cooking.  No stairs.  Pillows x 2.  Nocturia x 2.  Exam negative.  Palps primarily assoc'd with anxiety --> unlikely to be pathologic.     (I49.3) PVC (premature ventricular contraction)  Comment: Same as above.    (I10) Essential hypertension  Comment: Labile blood pressure.  She is on chlorthalidone, atenolol and valsartan.  Asked patient to record blood pressure twice a day and reevaluate with in person visit in a month.  17 May:  She didn't do it -- brings the wrist monitor but no written numbers -- instructed to prepare 60 BP's with left column of AM's and right column of PM's.  Jan 2022 did not bring diary.  Seems that it must not be so important to her.    CV Rx Jan 2022:  Norvasc 5.  Valsartan 320.  Atenolol 100.  Chlorthalidone 25.         Meclizine 25 q8h prn.    (E78.5) Hyperlipidemia, unspecified hyperlipidemia  type  Comment: 10 year ASCVD risk 12.1%. Will qualify asprin and statin treatment for primary ASCVD  prevention.  Wanted to defer Rx.    My impression is that of non-cardiac SOB and chest pressure -- hours, non-exertional, + with emotions.  She has negligible palps - had no sxs during the Holter.    I'm going to leave the BP issue to Dr. Raynelle FanningAparna -- instructed to prepare the BP diary and take it to her.  The issue of palps is not of concern, nor is the chest pressure.  Will try treadmill stress -- she promises to work hard on the treadmill -- trying to quantitate her exercise.    I'll leave BP control to Dr. Raynelle FanningAparna -- can't do much with her declining to bring BP diary.  April 2022 usually 110/80.    Repeat Holter.  Get treadmill.  Echo was essentially normal except for grade 2 DD.  But we've changed our criteria for DD, so will repeat it now.    Echo no change except mild asc ao ectasia.  Recheck 2-3 years.  Holter with benign ectopy.    Stress test was done to bring out ex-induced arrhythmias, but as luck would have it, she dropped her ST segments -- very likely false +, but I'm forced to go with [facility].    Televisit the week after.

## 2021-01-14 ENCOUNTER — Ambulatory Visit: Payer: No Typology Code available for payment source | Attending: Internal Medicine | Admitting: Internal Medicine

## 2021-01-14 ENCOUNTER — Other Ambulatory Visit: Payer: Self-pay

## 2021-01-14 ENCOUNTER — Other Ambulatory Visit (HOSPITAL_BASED_OUTPATIENT_CLINIC_OR_DEPARTMENT_OTHER): Payer: Self-pay | Admitting: "Psychiatric/Mental Health

## 2021-01-14 ENCOUNTER — Encounter (HOSPITAL_BASED_OUTPATIENT_CLINIC_OR_DEPARTMENT_OTHER): Payer: Self-pay | Admitting: "Psychiatric/Mental Health

## 2021-01-14 ENCOUNTER — Encounter (HOSPITAL_BASED_OUTPATIENT_CLINIC_OR_DEPARTMENT_OTHER): Payer: Self-pay | Admitting: Internal Medicine

## 2021-01-14 VITALS — BP 156/82 | HR 72 | Temp 97.2°F | Resp 18 | Ht 58.66 in | Wt 195.0 lb

## 2021-01-14 DIAGNOSIS — R61 Generalized hyperhidrosis: Secondary | ICD-10-CM | POA: Insufficient documentation

## 2021-01-14 DIAGNOSIS — N289 Disorder of kidney and ureter, unspecified: Secondary | ICD-10-CM | POA: Insufficient documentation

## 2021-01-14 DIAGNOSIS — R2243 Localized swelling, mass and lump, lower limb, bilateral: Secondary | ICD-10-CM | POA: Diagnosis present

## 2021-01-14 DIAGNOSIS — I10 Essential (primary) hypertension: Secondary | ICD-10-CM | POA: Insufficient documentation

## 2021-01-14 DIAGNOSIS — N1831 Chronic kidney disease, stage 3a: Secondary | ICD-10-CM | POA: Diagnosis present

## 2021-01-14 LAB — BASIC METABOLIC PANEL
ANION GAP: 12 mmol/L (ref 10–22)
BUN (UREA NITROGEN): 19 mg/dL — ABNORMAL HIGH (ref 7–18)
CALCIUM: 9.2 mg/dl (ref 8.5–10.1)
CARBON DIOXIDE: 27 mmol/L (ref 21–32)
CHLORIDE: 102 mmol/L (ref 98–107)
CREATININE: 1.2 mg/dL (ref 0.4–1.2)
ESTIMATED GLOMERULAR FILT RATE: 50 mL/min — ABNORMAL LOW (ref >60)
Glucose Random: 90 mg/dL (ref 74–160)
POTASSIUM: 3.8 mmol/L (ref 3.5–5.1)
SODIUM: 141 mmol/L (ref 136–145)

## 2021-01-14 LAB — TSH (THYROID STIMULATING HORMONE): TSH (THYROID STIM HORMONE): 7.09 u[IU]/mL — ABNORMAL HIGH (ref 0.270–4.200)

## 2021-01-14 MED ORDER — VENLAFAXINE HCL ER 150 MG PO CP24
ORAL_CAPSULE | ORAL | 2 refills | Status: DC
Start: 2021-01-14 — End: 2021-01-15

## 2021-01-14 MED ORDER — VENLAFAXINE HCL ER 37.5 MG PO CP24
ORAL_CAPSULE | ORAL | 2 refills | Status: DC
Start: 2021-01-14 — End: 2021-01-15

## 2021-01-14 MED FILL — VENLAFAXINE 150MG ER: 30 days supply | Qty: 30 | Fill #0

## 2021-01-14 MED FILL — VENLAFAXINE  37.5MG ER: 30 days supply | Qty: 30 | Fill #0

## 2021-01-14 NOTE — Progress Notes (Signed)
66 y/o F clinic visit:    #"Leg swelling & kidney problems":  From chart, problem list includes hx Stage 3a chronic kidney disease, HTN, obesity  From chart BP meds include chlorthalidone 25 mg daily, atenolol 100 mg daily, amlodipine 5 mg - Valsartan 325 mg daily combo, patient confirms she is taking them regularly.   She notes more swelling both lower legs past month  Shoes more tight now  From chart 5/21 [name] of kidneys overtly normal  Last Cr normal range  Check BP at home all normal < 140/90 usually 110/70 (unless not taken at rest)  ECHO 4/22 EF 60%, Grade II diastolic dysfunction  From Cardiology note, he shared most strenuous activity lately is cooking    ROS: No overt headache, no overt shortness of breath, no overt chest pain, no overt abdominal pain, no overt difficulty walking, sweats at times amid heat would like to check thyroid, aches in both knees at times, not overtly new; no overt swelling nor redness there    Physical exam  Vitals: Reviewed BP 156/82, HR 70s  General: Overtly normal affect & conversation  HEENT: Overtly normal sclera, pupils and gaze  Pulm: Clear to auscultation bilaterally, no wheeze nor crackles  CV: Overtly normal heart rate and rhythm  Abd: No overt distention nor pain   Extrem: subtle pitting edema with deep palpation of lower lower legs  Neuro: Overtly normal gait    A&P  #Bilateral lower leg swelling with hx renal dysfunction:  Subtle on exam; perhaps combination of diastolic dysfunction and physical stasis  -BMP  -Advised gentle regular exercise as able, leg elevation & reduced salt  -Counseled regarding general self care  -Counseled patient re signs & symptoms to prompt swift re-evaluation    #HTN:  Given normal reported home readings will hold off on med increase for now  -Follow-up home readings   -Counseled regarding general self care  -Counseled patient re signs & symptoms to prompt swift re-evaluation    #Knee aches:  Likely musculoskeletal strain and possible  arthritis.  -Counseled regarding general self care  -Counseled patient re signs & symptoms to prompt swift re-evaluation  -Offer PT    #Request for lab:  -TSH per pt wishes  -Counseled regarding general self care  -Counseled patient re signs & symptoms to prompt swift re-evaluation    #F/U with me.

## 2021-01-14 NOTE — Telephone Encounter (Signed)
PER Pharmacy, Sara Snyder is a 65 year old female has requested a refill of      -  venlafaxine 37.5 mg and venlafaxine 150 mg        Last Office Visit: 12/05/20 with lombardi   Last Physical Exam: 06/13/20     FECAL OCCULT BLOOD AGE 23+ due on 12/03/2018     Other Med Adult:  Most Recent BP Reading(s)  01/14/21 : (!) 156/82        Cholesterol (mg/dL)   Date Value   00/71/2197 215     LOW DENSITY LIPOPROTEIN DIRECT (mg/dL)   Date Value   58/83/2549 141     HIGH DENSITY LIPOPROTEIN (mg/dL)   Date Value   82/64/1583 43     TRIGLYCERIDES (mg/dL)   Date Value   09/40/7680 148         THYROID SCREEN TSH REFLEX FT4 (uIU/mL)   Date Value   01/24/2020 5.010 (H)         No results found for: TSH    HEMOGLOBIN A1C (%)   Date Value   06/13/2020 6.2 (H)       No results found for: POCA1C      No results found for: INR    SODIUM (mmol/L)   Date Value   06/13/2020 139       POTASSIUM (mmol/L)   Date Value   06/13/2020 3.8           CREATININE (mg/dL)   Date Value   88/04/314 1.2        Documented patient preferred pharmacies:    [facility] [place], [place] - [address]  Phone: [phone] Fax: [phone]    [facility], [place] - [address]  Phone: [phone] Fax: [phone]

## 2021-01-15 ENCOUNTER — Telehealth (HOSPITAL_BASED_OUTPATIENT_CLINIC_OR_DEPARTMENT_OTHER): Payer: Self-pay | Admitting: Internal Medicine

## 2021-01-15 DIAGNOSIS — R7989 Other specified abnormal findings of blood chemistry: Secondary | ICD-10-CM

## 2021-01-15 MED ORDER — VENLAFAXINE HCL ER 150 MG PO CP24
ORAL_CAPSULE | ORAL | 2 refills | Status: DC
Start: 2021-01-15 — End: 2021-05-14

## 2021-01-15 MED ORDER — LEVOTHYROXINE SODIUM 50 MCG PO TABS
50.0000 ug | ORAL_TABLET | Freq: Every morning | ORAL | 2 refills | Status: DC
Start: 2021-01-15 — End: 2021-02-06

## 2021-01-15 MED FILL — CLONAZEPAM 1MG: 30 days supply | Qty: 60 | Fill #1 | Status: CP

## 2021-01-15 MED FILL — LEVOTHYROXIN 50MCG: 30 days supply | Qty: 30 | Fill #0 | Status: CP

## 2021-01-15 NOTE — Telephone Encounter (Signed)
TSH checked yesterday at pt request  From chart was up a bit ~ 5 last year  Now ~7  Called and spoke with pt with interp  When asked, she notes lately low energy, lower mood, weight gain & chronic constipation   She would like to try thyroid replacement  Will begin gently given age, hx panic with 50 mcg daily and follow-up TSH  Counseled re use and possible side-effects  She shares she has had excessive sweating for some time which upsets her; no overt cough nor fever  From her chart she is on a higher end of dose for effexor, common side-effect of which is sweating; will trial dose decrease to 150 mg daily; update sent to pharmacy  -Counseled regarding general self care  -Counseled patient re signs & symptoms to prompt swift re-evaluation

## 2021-01-21 ENCOUNTER — Ambulatory Visit (HOSPITAL_BASED_OUTPATIENT_CLINIC_OR_DEPARTMENT_OTHER): Admission: RE | Admit: 2021-01-21 | Payer: No Typology Code available for payment source | Source: Ambulatory Visit

## 2021-01-21 ENCOUNTER — Other Ambulatory Visit (HOSPITAL_BASED_OUTPATIENT_CLINIC_OR_DEPARTMENT_OTHER): Payer: Self-pay | Admitting: Internal Medicine

## 2021-01-21 ENCOUNTER — Other Ambulatory Visit: Payer: Self-pay

## 2021-01-21 ENCOUNTER — Ambulatory Visit
Admission: RE | Admit: 2021-01-21 | Discharge: 2021-01-21 | Disposition: A | Discharge status: Home / Self Care | Payer: No Typology Code available for payment source | Attending: Internal Medicine | Admitting: Internal Medicine

## 2021-01-21 DIAGNOSIS — R002 Palpitations: Secondary | ICD-10-CM

## 2021-01-21 DIAGNOSIS — R0789 Other chest pain: Secondary | ICD-10-CM

## 2021-01-29 ENCOUNTER — Ambulatory Visit (HOSPITAL_BASED_OUTPATIENT_CLINIC_OR_DEPARTMENT_OTHER): Payer: Self-pay

## 2021-01-29 MED FILL — OMEPRAZOLE   20MG: 90 days supply | Qty: 90 | Fill #1

## 2021-01-30 ENCOUNTER — Ambulatory Visit (HOSPITAL_BASED_OUTPATIENT_CLINIC_OR_DEPARTMENT_OTHER): Payer: Self-pay | Admitting: Physician Assistant

## 2021-02-06 ENCOUNTER — Ambulatory Visit: Payer: No Typology Code available for payment source | Attending: Internal Medicine | Admitting: Internal Medicine

## 2021-02-06 ENCOUNTER — Encounter (HOSPITAL_BASED_OUTPATIENT_CLINIC_OR_DEPARTMENT_OTHER): Payer: Self-pay | Admitting: Internal Medicine

## 2021-02-06 ENCOUNTER — Other Ambulatory Visit: Payer: Self-pay

## 2021-02-06 VITALS — BP 161/74 | HR 68 | Temp 97.1°F | Ht 58.66 in | Wt 195.0 lb

## 2021-02-06 DIAGNOSIS — E039 Hypothyroidism, unspecified: Secondary | ICD-10-CM | POA: Diagnosis present

## 2021-02-06 DIAGNOSIS — L659 Nonscarring hair loss, unspecified: Secondary | ICD-10-CM | POA: Diagnosis present

## 2021-02-06 DIAGNOSIS — I10 Essential (primary) hypertension: Secondary | ICD-10-CM | POA: Diagnosis present

## 2021-02-06 DIAGNOSIS — M8580 Other specified disorders of bone density and structure, unspecified site: Secondary | ICD-10-CM | POA: Diagnosis present

## 2021-02-06 DIAGNOSIS — Z23 Encounter for immunization: Secondary | ICD-10-CM | POA: Diagnosis present

## 2021-02-06 MED ORDER — LEVOTHYROXINE SODIUM 75 MCG PO TABS
75.0000 ug | ORAL_TABLET | Freq: Every morning | ORAL | 2 refills | Status: DC
Start: 2021-02-06 — End: 2021-07-14

## 2021-02-06 MED FILL — LEVOTHYROXIN 75MCG: 30 days supply | Qty: 30 | Fill #0 | Status: CP

## 2021-02-06 NOTE — Progress Notes (Signed)
02/06/2021  VIS given prior to administration and reviewed with the patient and or legal guardian. Patient understands the disease and the vaccine. See immunization/Injection module or chart review for date of publication and additional information.  Edilia Bo Rodriges, LPN  [id number] Vaccine Administration:    Patient screened according to guidelines.Vaccine Information Fact Sheet given prior to administration and all patient/parentquestions addressed. Covid-19 vaccine given without incident. Possible side effects reviewed. Returnto clinicfor nextscheduled dose if indicated.    Edilia Bo Rodriges, LPN

## 2021-02-06 NOTE — Progress Notes (Signed)
65 y/o F clinic visit:  Here with daughter     #Follow-up:  Seen by me 7/26 for:  1. Bilateral lower leg swelling with hx renal dysfuntion: checked BMP (Cr 1.2 and e-lytes normal)  2. HTN: planned recheck given normal home readings (at home up to 150 systolic "if upset")  3. Knee aches: referred to PT  4. Pt requested lab: TSH elevated at 7.090 (currently on levothyroxine 50 mcg daily)  Also daughter asking about bone density results (2/22 osteopenia)  Also daughter / patient asking for Derm referral: scalp diffuse hair loss    ROS: No overt headache, no overt shortness of breath, no overt chest pain, no overt abdominal pain, no overt difficulty walking    Physical exam  Vitals: Reviewed, BMI 39  General: Overtly normal affect & conversation  HEENT: Overtly normal sclera, pupils and gaze  Abd: No overt distention nor pain   Extrem: No overt swelling of the lower legs  Neuro: Overtly normal gait  Skin: NO overt patchy hair loss nor overt scalp rash    A&P  #Hx hypothyroid: increase levothyroxine to 75 mcg daily  -Repeat TSH 4-6 weeks  -Counseled regarding general self care  -Counseled patient re signs & symptoms to prompt swift re-evaluation    #HTN: Pt declines suggested med (amlodipine) increase for now.    -Home monitoring & f/u in clinic for re-eval  -Counseled regarding general self care  -Counseled patient re signs & symptoms to prompt swift re-evaluation    #Osteopenia:  -Discussed vit D and Ca and weight bearing exercise  -Counseled regarding general self care  -Counseled patient re signs & symptoms to prompt swift re-evaluation    #Diffuse hair thinning:  -Derm referral per request  -Counseled regarding general self care  -Counseled patient re signs & symptoms to prompt swift re-evaluation    #F/U with me or new PCP.

## 2021-02-06 NOTE — Progress Notes (Signed)
VIS given prior to administration and reviewed with the patient and or legal guardian. Patient understands the disease and the vaccine. See immunization/Injection module or chart review for date of publication and additional information.    Okema Rollinson, RN, 02/06/2021

## 2021-02-12 ENCOUNTER — Ambulatory Visit (HOSPITAL_BASED_OUTPATIENT_CLINIC_OR_DEPARTMENT_OTHER): Payer: Self-pay

## 2021-02-13 MED FILL — VENLAFAXINE 150MG ER: 30 days supply | Qty: 30 | Fill #0

## 2021-02-13 MED FILL — ATENOLOL 100MG: 90 days supply | Qty: 90 | Fill #2

## 2021-02-20 ENCOUNTER — Encounter (HOSPITAL_BASED_OUTPATIENT_CLINIC_OR_DEPARTMENT_OTHER): Payer: Self-pay | Admitting: Internal Medicine

## 2021-02-20 DIAGNOSIS — M8580 Other specified disorders of bone density and structure, unspecified site: Secondary | ICD-10-CM | POA: Insufficient documentation

## 2021-02-20 HISTORY — DX: Other specified disorders of bone density and structure, unspecified site: M85.80

## 2021-02-25 ENCOUNTER — Other Ambulatory Visit: Payer: Self-pay

## 2021-02-25 ENCOUNTER — Ambulatory Visit: Payer: No Typology Code available for payment source | Attending: Internal Medicine | Admitting: Internal Medicine

## 2021-02-25 ENCOUNTER — Encounter (HOSPITAL_BASED_OUTPATIENT_CLINIC_OR_DEPARTMENT_OTHER): Payer: Self-pay | Admitting: Internal Medicine

## 2021-02-25 VITALS — BP 148/78 | HR 71 | Temp 97.1°F | Resp 18 | Ht 58.66 in | Wt 194.0 lb

## 2021-02-25 DIAGNOSIS — R0981 Nasal congestion: Secondary | ICD-10-CM | POA: Insufficient documentation

## 2021-02-25 DIAGNOSIS — I10 Essential (primary) hypertension: Secondary | ICD-10-CM | POA: Diagnosis present

## 2021-02-25 DIAGNOSIS — R7989 Other specified abnormal findings of blood chemistry: Secondary | ICD-10-CM | POA: Diagnosis not present

## 2021-02-25 DIAGNOSIS — R040 Epistaxis: Secondary | ICD-10-CM | POA: Diagnosis present

## 2021-02-25 LAB — THYROID SCREEN TSH REFLEX FT4: THYROID SCREEN TSH REFLEX FT4: 0.353 u[IU]/mL (ref 0.270–4.200)

## 2021-02-25 MED ORDER — LORATADINE 10 MG PO TABS
10.0000 mg | ORAL_TABLET | Freq: Every day | ORAL | 1 refills | Status: DC | PRN
Start: 2021-02-25 — End: 2022-02-24

## 2021-02-25 MED FILL — LORATADINE  10MG: 90 days supply | Qty: 90 | Fill #0 | Status: CP

## 2021-02-25 NOTE — Progress Notes (Signed)
65 y/o F clinic:    #"BP & nose issue":  Feels nose gets dry and irritated  If she blows it persistently at times she gets a bloody nose on both sides (not currently)  Episodically X years sine she moved to [name]   She feels worse with heat / AC  Applies Vasoline on the inside at times  From chart rx astalin nose spray, not currently; she shares she has tried many nasal sprays without obvious effect  Not taking allergy meds    BP meds; currently on chlorthalidone 25 mg daily. amlodipine / valsartan combo 5/ 320 mg daily     ROS: No overt headache, no shortness of breath, no chest pain, no abdominal pain, no difficulty walking    Physical exam  Vitals: Reviewed, 148/78 (recheck manually with me 138/72), 71  General: Overtly normal affect & conversation  HEENT: Overtly normal sclera, pupils and gaze  Abd: No overt distention nor pain   Extrem: No overt swelling of the lower legs  Neuro: Overtly normal gait    A&P  #Chronic episodic bilateral nose irritation & bleeds:  Possible irritation from seasonal allergies, central heat / air, chronic internal vasoline application  -Trial loratadine; counsel re use and possible side-effects  -May try humidifier  -Advised stopping Vaseline internally  -Counseled regarding general self care  -Counseled patient re signs & symptoms to prompt swift re-evaluation    #Blood pressure:   Within ~normal range on recheck.   Will hold off on any BP med changes today in accord with pt wishes  -Cont as above  -Counseled regarding general self care  -Counseled patient re signs & symptoms to prompt swift re-evaluation    #F/U with above & new PCP or me (also TSH would like to get today to avoid another clinic trip which is difficult for family)

## 2021-02-26 ENCOUNTER — Ambulatory Visit
Admission: RE | Admit: 2021-02-26 | Discharge: 2021-02-26 | Disposition: A | Discharge status: Home / Self Care | Payer: No Typology Code available for payment source | Attending: Internal Medicine | Admitting: Internal Medicine

## 2021-02-26 ENCOUNTER — Other Ambulatory Visit (HOSPITAL_BASED_OUTPATIENT_CLINIC_OR_DEPARTMENT_OTHER): Payer: Self-pay | Admitting: Internal Medicine

## 2021-02-26 ENCOUNTER — Ambulatory Visit (HOSPITAL_BASED_OUTPATIENT_CLINIC_OR_DEPARTMENT_OTHER)
Admission: RE | Admit: 2021-02-26 | Discharge: 2021-02-26 | Disposition: A | Discharge status: Home / Self Care | Payer: No Typology Code available for payment source | Source: Ambulatory Visit

## 2021-02-26 DIAGNOSIS — R0789 Other chest pain: Secondary | ICD-10-CM

## 2021-02-26 DIAGNOSIS — R079 Chest pain, unspecified: Secondary | ICD-10-CM

## 2021-02-26 MED ORDER — AMINOPHYLLINE 25 MG/ML IV SOLN
50.0000 mg | INTRAVENOUS | Status: DC | PRN
Start: 2021-02-26 — End: 2021-02-27
  Administered 2021-02-26: 50 mg via INTRAVENOUS
  Filled 2021-02-26: qty 10

## 2021-02-26 MED ORDER — REGADENOSON 0.4 MG/5ML IV SOLN
0.40 mg | Freq: Once | INTRAVENOUS | Status: AC
Start: 2021-02-26 — End: 2021-02-26
  Administered 2021-02-26: 0.4 mg via INTRAVENOUS
  Filled 2021-02-26: qty 5

## 2021-02-26 MED ORDER — TECHNETIUM TC-99M SESTAMIBI (CARDIOLITE) INJECTION
9.00 | Freq: Once | INTRAVENOUS | Status: AC
Start: 2021-02-26 — End: 2021-02-26
  Administered 2021-02-26: 13 via INTRAVENOUS

## 2021-02-26 MED ORDER — TECHNETIUM TC-99M SESTAMIBI (CARDIOLITE) INJECTION
9.00 | Freq: Once | INTRAVENOUS | Status: AC
Start: 2021-02-26 — End: 2021-02-26
  Administered 2021-02-26: 31.8 via INTRAVENOUS

## 2021-02-26 NOTE — Progress Notes (Signed)
Pt. Arrived to the stress lab.   Test explained and consent obtained.   Brief history taken with no active chest pain.  No history of asthma and lung sounds clear upon assessment.   Vitals WNL for patient HR 63 BP 152/70 spO2 97  %.   Exercise stress test started.   IV started and intravenous Regadenoson (lexiscan) was injected, over 10 seconds.   No chest pain throughout test.   Completed test without complications.   Lexiscan was reversed with 50 mg of Aminophylline.   Pt. Was observed for 5 minutes and left the stress lab in stable condition.   Please see Stress Report in Cardiopulmonary tab.Charlotta Newton, RN, 02/26/2021

## 2021-02-27 ENCOUNTER — Other Ambulatory Visit (HOSPITAL_BASED_OUTPATIENT_CLINIC_OR_DEPARTMENT_OTHER): Payer: Self-pay | Admitting: "Psychiatric/Mental Health

## 2021-02-27 ENCOUNTER — Ambulatory Visit (HOSPITAL_BASED_OUTPATIENT_CLINIC_OR_DEPARTMENT_OTHER): Payer: No Typology Code available for payment source | Admitting: "Psychiatric/Mental Health

## 2021-02-27 NOTE — Progress Notes (Signed)
[facility] f/u appt today. Called twice w/ an interpreter; no answer, left a vmail to call Scheduling to reappoint.

## 2021-02-28 MED FILL — CHLORTHALID 25MG: 90 days supply | Qty: 90 | Fill #3

## 2021-03-04 ENCOUNTER — Ambulatory Visit: Payer: No Typology Code available for payment source | Attending: Internal Medicine | Admitting: Internal Medicine

## 2021-03-04 DIAGNOSIS — R002 Palpitations: Secondary | ICD-10-CM | POA: Insufficient documentation

## 2021-03-04 DIAGNOSIS — Z6837 Body mass index (BMI) 37.0-37.9, adult: Secondary | ICD-10-CM | POA: Insufficient documentation

## 2021-03-04 DIAGNOSIS — E66812 Obesity, class 2: Secondary | ICD-10-CM

## 2021-03-04 DIAGNOSIS — R0789 Other chest pain: Secondary | ICD-10-CM | POA: Insufficient documentation

## 2021-03-04 DIAGNOSIS — I10 Essential (primary) hypertension: Secondary | ICD-10-CM | POA: Insufficient documentation

## 2021-03-04 DIAGNOSIS — F410 Panic disorder [episodic paroxysmal anxiety] without agoraphobia: Secondary | ICD-10-CM | POA: Insufficient documentation

## 2021-03-04 DIAGNOSIS — E6609 Other obesity due to excess calories: Secondary | ICD-10-CM | POA: Insufficient documentation

## 2021-03-04 NOTE — Progress Notes (Signed)
CARDIOLOGY TELEVISIT PROGRESS NOTE  Date of visit: 03/04/2021    Call dau [name] at [phone] # --  Speaking slowly was able to comm well with [name] in [place].    65 y/o [place] mother of 5 and GM of 3, today  acc'd and interpreted in the past by her dau  Taygan Connell, cell [phone].  But [name] asks for tele terp, even though her English is good.  So went with the tele, even though the interp will not be as good as a dau who knows her.    In the past was acc'd and interpreted by other dau Matilde Bash, cell [phone],  Pt resides with her husband and one dau and one grandchild.          Confirm Patient Identity Sara Snyder ; 1956/01/06)  In summary patient is 65 year old female with the following medical problems:  Essential hypertension  Panic disorder  Palpitations   Obesity: 4'11", 194 Lbs, BMI 40, Feb 2022.    Diastolic dysfunction on ECHO       She was referred by Dr. Raynelle Fanning  for concerns of SVT and VPC's  on Holter of Sept 2020.  Marland Kitchen PMH includes hypertension, hyperlipidemia.    Holter and echo Sept 2020 revealed grade 2 diastolic dysfunction.  Her PCP Dr. Raynelle Fanning referred for for eval of the Holter findings of 7 short runs of SVT but no VT.    Hx includes palpitation with panic attacks 1-2x/year for many years.  In the past she c/o chest pain near her right breast, but not now.  C/o SOB both at rest and with exertion -- c/o SOB while I'm interviewing her but she is not tachypneic with no evidence of distress.  The dyspnea appears to be supratentorial.    She says her breathing is worse since she had Covid Feb 2021. Walked here from the main entrance of the hospital.sin para. Says trouble with breath with walking x years.    Sept 2022 via dau:  No CP or SOB.    She is limited by bilateral knee pain. (her BMI is 40 at 4'10", 194 lbs.)     Nonexertional. BP  has been labile with SBP from 90 -190.  She has lightheadedness with bending down. No syncope.  She feels extremely fatigue and SOB with minimal  housework. No regular exercise during the pandemic.      Current Outpatient Medications:     loratadine (CLARITIN) 10 MG tablet, Take 1 tablet by mouth daily as needed for Allergies (and nose congestion), Disp: 90 tablet, Rfl: 1    levothyroxine (SYNTHROID) 75 MCG tablet, Take 1 tablet by mouth every morning before breakfast Note dose increase, stop other dose of this medicine., Disp: 30 tablet, Rfl: 2    venlafaxine (EFFEXOR-XR) 150 MG 24 hr capsule, Take one capsule daily by mouth note dose reduction.  (150 mg total daily), Disp: 30 capsule, Rfl: 2    clonazePAM (KLONOPIN) 1 MG tablet, 1 tablet twice daily, PRN, Disp: 60 tablet, Rfl: 2    omeprazole (PRILOSEC) 20 MG capsule, Take 1 capsule by mouth daily, Disp: 90 capsule, Rfl: 3    Calcium Carb-Cholecalciferol 500-400 MG-UNIT TABS, Take 1 tablet by mouth daily with lunch, Disp: 90 tablet, Rfl: 3    azelastine (ASTELIN) 0.1 % nasal spray, 1 spray by Each Nostril route 2 (two) times daily Use in each nostril as directed, Disp: 30 mL, Rfl: 3    cyclobenzaprine (FLEXERIL)  5 MG tablet, Take 1 tablet by mouth nightly, Disp: 20 tablet, Rfl: 0    lidocaine (LIDODERM) 5 % patch, Place 1 patch onto the skin daily, Disp: 30 patch, Rfl: 2    cholecalciferol (VITAMIN D3) 2000 UNIT tablet, Take 1 tablet by mouth 2 (two) times daily, Disp: 180 tablet, Rfl: 3    amlodipine-valsartan (EXFORGE) 5-320 MG per tablet, Take 1 tablet by mouth daily, Disp: 90 tablet, Rfl: 2    ramelteon (ROZEREM) 8 MG tablet, Take 1 tablet by mouth nightly, Disp: 30 tablet, Rfl: 3    atenolol (TENORMIN) 100 MG tablet, Take 1 tablet by mouth nightly, Disp: 90 tablet, Rfl: 3    chlorthalidone (HYGROTEN) 25 MG tablet, Take 1 tablet by mouth daily, Disp: 90 tablet, Rfl: 3    Multiple Vitamins-Minerals (HAIR SKIN AND NAILS FORMULA) TABS, Take 1 capsule by mouth daily, Disp: 30 tablet, Rfl: 12    Review of Patient's Allergies indicates:   Pollen extract          Cough    SOCIAL & FAMILY  HISTORY: No fam hx heart dis. No substances.    Review of systems: All other systems reviewed were pertinently negative apart from the history of present illness.      PERTINENT INVESTIGATIONS:    1.  EKG  (on my personal review)             May 2021: NSR 61.Very low amp P's -- V1 best seen.NORMAL.                Jan 2022:  NSR 72.  LVH voltage.  Normal ST-T's.      2. LABS:   Lab Results   Component Value Date    NA 141 01/14/2021    K 3.8 01/14/2021    CL 102 01/14/2021    CO2 27 01/14/2021    BUN 19 (H) 01/14/2021    CREAT 1.2 01/14/2021    GLUCOSER 90 01/14/2021     Lab Results   Component Value Date    LDL 141 06/13/2020    HDL 43 06/13/2020    TG 148 06/13/2020     No results found for: PROBNP  No results found for: BNP    3. ECHOCARDIOGRAM            September 2020:                    60%. No RWMA's. Gr 2 DD. Asc ao 37 mm.Marland Kitchen               April 2022:  60%.  Gr 2 DD.  Mild AI. Mild asc ao ectasia 40 mm.         4. STRESS TEST:            March 2022:  3 minutes, stopped for SOB & knee pain.  82% HR.  DP 22.                5 METS.  20% decr ex cap.   No CP or dangerous arrhythmia.  But 1 mm flat              ST in inflat leads, 1st min, subside 11 min recovery.            Sept 2022:  Lexi-nuke.  1 mm ST depr with Lexiscan but no sxs.                  Nuclear  imaging:  NORMAL PERFUSION =  no ischemia or infarction.                          5. HOLTER          Sept 2020:       NSR 70, 52 - 100.  600 APC = 0.6T, 15 AC, 7 short      slow SVT.600 VPC - 0.6%, 2 VC, no VT.  No sxs.           April 2022:  73, 54 - 121.  142 APC's:  4 AC, one 5-beat run @ 130 BPM.              500 VPC's -- no VC or VT.  No sx.         ASSESSMENT/ PLAN:    (R00.2) Palpitations  (primary encounter diagnosis)  Comment:  Holter above Sept 2020 with 600 APC and 600 VPC with 7 short runs SVT, but no VT.      Was already on atenolol.  No further treatment or work-up is indicated given the small burden of VPCs in a patient with no wall motion  abnormality on echo and chronic mild symptoms.    Reports palps with anxiety.  In the past she became tearful, dau trying to help her answer questions.  Palps -- feels like heart is beating faster -- lies down until it subsides over an hour.  Occ takes Ativan for it.  No falling.  + vertigo hx. -- "labyrinthitis". - could last a week at a time.  "Head feels bad", can last hours, all day long.  Does not sound like cardiac palps.  No further assessment unless spontaneous hemodynamic c/o's.  She tends to respond affirmatively to virtually all questions re sxs, so better to await spontaneous c/o's    April 2022:      CHEST PRESSURE  May 2021 reports precordial pressure -- "anguish or pressure -- tightness".  Goes off on tangent about meds, depression, bones, etc etc.  Cautioned to answer questions explicitly.  Dau assists -- twice per day, lasting hours.  No radiation.  Can be severe.  Loquacious -- very poor historian -- doesn't address questions, rambles.  With and without exertion,  Esp when sad -- can't say if assoc'd  with panic attacks.  No assoc'd diaphoresis, + nausea, some.  I advised her that it doesn't sound cardiac.    Negligible exercise.  Most vigorous activity is cooking.  No stairs.  Pillows x 2.                  Nocturia x 2.  Exam in the past negative except for morbid obesity.          Palps primarily assoc'd with anxiety --> unlikely to be pathologic.     (I49.3) PVC (premature ventricular contraction)  Comment: Same as above.    (I10) Essential hypertension  Comment: Labile blood pressure.  She is on chlorthalidone, atenolol and valsartan.  Asked patient to record blood pressure twice a day and reevaluate with in person visit in a month.  17 May:  She didn't do it -- brought the wrist monitor but no written numbers -- instructed to prepare 60 BP's with left column of AM's and right column of PM's.  Jan 2022 did not bring diary.  Seems that it must not be so important to her.    CV Rx Sept 2022:  Norvasc 5 combo w valsartan 320. Atenolol 100.        Chlorthalidone 25.      (E78.5) Hyperlipidemia  Comment: 10 year ASCVD risk 12.1%. Will qualify asprin and statin treatment for primary ASCVD prevention, but she wanted to defer Rx.    My impression is that of non-cardiac SOB and chest pressure -- hours, non-exertional, + with emotions.  She has negligible palps - had no sxs during the Holter.    I'll leave BP control to her PCP -- can't do much with her declining to bring BP diary.  April 2022 usually 110/80.     Echo was essentially nl except for grade 2 DD and mild asc ao ectasia of 40 mm.     Recheck in 2-3 years.    Holter with benign ectopy.    Nuclear stress NEGATIVE:  No infarct or ischemia.  Reassured.    See me in person one year.

## 2021-03-06 ENCOUNTER — Ambulatory Visit (HOSPITAL_BASED_OUTPATIENT_CLINIC_OR_DEPARTMENT_OTHER): Payer: Self-pay

## 2021-03-11 ENCOUNTER — Other Ambulatory Visit (HOSPITAL_BASED_OUTPATIENT_CLINIC_OR_DEPARTMENT_OTHER): Payer: Self-pay | Admitting: Internal Medicine

## 2021-03-11 DIAGNOSIS — I10 Essential (primary) hypertension: Secondary | ICD-10-CM

## 2021-03-11 MED FILL — CLONAZEPAM 1MG: 30 days supply | Qty: 60 | Fill #2 | Status: CP

## 2021-03-11 MED FILL — VENLAFAXINE 150MG ER: 30 days supply | Qty: 30 | Fill #1 | Status: CP

## 2021-03-11 MED FILL — *CHLORTHALID 25MG: 90 days supply | Qty: 90 | Fill #3 | Status: CP

## 2021-03-11 NOTE — Telephone Encounter (Signed)
PER Pharmacy, Sara Snyder is a 65 year old female has requested a refill of      - exforge       Last Office Visit: 03/04/21 with Risser  Last Physical Exam: 06/13/20     FECAL OCCULT BLOOD AGE 55+ due on 12/03/2018     Other Med Adult:  Most Recent BP Reading(s)  02/26/21 : 152/70        Cholesterol (mg/dL)   Date Value   15/95/3967 215     LOW DENSITY LIPOPROTEIN DIRECT (mg/dL)   Date Value   28/97/9150 141     HIGH DENSITY LIPOPROTEIN (mg/dL)   Date Value   41/36/4383 43     TRIGLYCERIDES (mg/dL)   Date Value   77/93/9688 148         THYROID SCREEN TSH REFLEX FT4 (uIU/mL)   Date Value   02/25/2021 0.353         TSH (THYROID STIM HORMONE) (uIU/mL)   Date Value   01/14/2021 7.090 (H)       HEMOGLOBIN A1C (%)   Date Value   06/13/2020 6.2 (H)       No results found for: POCA1C      No results found for: INR    SODIUM (mmol/L)   Date Value   01/14/2021 141       POTASSIUM (mmol/L)   Date Value   01/14/2021 3.8           CREATININE (mg/dL)   Date Value   64/84/7207 1.2        Documented patient preferred pharmacies:    [facility] [place], [place] - [address]  Phone: [phone] Fax: [phone]

## 2021-03-12 ENCOUNTER — Encounter (HOSPITAL_BASED_OUTPATIENT_CLINIC_OR_DEPARTMENT_OTHER): Payer: Self-pay | Admitting: Internal Medicine

## 2021-03-12 DIAGNOSIS — I10 Essential (primary) hypertension: Secondary | ICD-10-CM

## 2021-03-12 MED FILL — AMLOD/VALSAR 5-320MG: 90 days supply | Qty: 90 | Fill #0

## 2021-03-13 ENCOUNTER — Encounter (HOSPITAL_BASED_OUTPATIENT_CLINIC_OR_DEPARTMENT_OTHER): Payer: Self-pay | Admitting: Internal Medicine

## 2021-03-13 DIAGNOSIS — I10 Essential (primary) hypertension: Secondary | ICD-10-CM

## 2021-03-13 NOTE — Telephone Encounter (Signed)
Medication ready for pick up at pharmacy. Pt notified

## 2021-03-13 NOTE — Telephone Encounter (Signed)
Medications have refill on file at the pharmacy.

## 2021-04-17 MED FILL — VENLAFAXINE 150MG ER: 30 days supply | Qty: 30 | Fill #2 | Status: CP

## 2021-05-14 ENCOUNTER — Encounter (HOSPITAL_BASED_OUTPATIENT_CLINIC_OR_DEPARTMENT_OTHER): Payer: Self-pay | Admitting: Internal Medicine

## 2021-05-14 ENCOUNTER — Encounter (HOSPITAL_BASED_OUTPATIENT_CLINIC_OR_DEPARTMENT_OTHER): Payer: Self-pay | Admitting: "Psychiatric/Mental Health

## 2021-05-14 ENCOUNTER — Other Ambulatory Visit (HOSPITAL_BASED_OUTPATIENT_CLINIC_OR_DEPARTMENT_OTHER): Payer: Self-pay | Admitting: Internal Medicine

## 2021-05-14 DIAGNOSIS — I10 Essential (primary) hypertension: Secondary | ICD-10-CM

## 2021-05-14 DIAGNOSIS — R1013 Epigastric pain: Secondary | ICD-10-CM

## 2021-05-14 DIAGNOSIS — K219 Gastro-esophageal reflux disease without esophagitis: Secondary | ICD-10-CM

## 2021-05-14 DIAGNOSIS — R140 Abdominal distension (gaseous): Secondary | ICD-10-CM

## 2021-05-14 MED FILL — LEVOTHYROXIN 75MCG: 30 days supply | Qty: 30 | Fill #1

## 2021-05-14 MED FILL — OMEPRAZOLE   20MG: 90 days supply | Qty: 90 | Fill #2

## 2021-05-14 MED FILL — ATENOLOL 100MG: 90 days supply | Qty: 90 | Fill #3

## 2021-05-14 NOTE — Telephone Encounter (Signed)
PER Patient (self), Sara Snyder is a 66 year old female has requested a refill of venlafaxine.      Last Office Visit: 02/25/2021 with Elmer Bales  Last Physical Exam: 06/13/2020    FECAL OCCULT BLOOD AGE 17+ due on 12/03/2018    Other Med Adult:  Most Recent BP Reading(s)  02/26/21 : 152/70        Cholesterol (mg/dL)   Date Value   09/92/7800 215     LOW DENSITY LIPOPROTEIN DIRECT (mg/dL)   Date Value   44/71/5806 141     HIGH DENSITY LIPOPROTEIN (mg/dL)   Date Value   38/68/5488 43     TRIGLYCERIDES (mg/dL)   Date Value   30/14/1597 148         THYROID SCREEN TSH REFLEX FT4 (uIU/mL)   Date Value   02/25/2021 0.353         TSH (THYROID STIM HORMONE) (uIU/mL)   Date Value   01/14/2021 7.090 (H)       HEMOGLOBIN A1C (%)   Date Value   06/13/2020 6.2 (H)       No results found for: POCA1C      No results found for: INR    SODIUM (mmol/L)   Date Value   01/14/2021 141       POTASSIUM (mmol/L)   Date Value   01/14/2021 3.8           CREATININE (mg/dL)   Date Value   33/05/5086 1.2       Documented patient preferred pharmacies:    [facility] [place], [place] - [address]  Phone: [phone] Fax: [phone]

## 2021-05-14 NOTE — Telephone Encounter (Signed)
Per Patient (self), Sara Snyder is a 65 year old female has requested a refill of CLONAZEPAM.      Last Office Visit: 12/05/2020 with LOMBARDI, L  Last Physical Exam: 06/13/2020    FECAL OCCULT BLOOD AGE 92+ due on 12/03/2018    Other Med Adult:  Most Recent BP Reading(s)  02/26/21 : 152/70        Cholesterol (mg/dL)   Date Value   55/06/5866 215     LOW DENSITY LIPOPROTEIN DIRECT (mg/dL)   Date Value   25/74/9355 141     HIGH DENSITY LIPOPROTEIN (mg/dL)   Date Value   21/74/7159 43     TRIGLYCERIDES (mg/dL)   Date Value   53/96/7289 148         THYROID SCREEN TSH REFLEX FT4 (uIU/mL)   Date Value   02/25/2021 0.353         TSH (THYROID STIM HORMONE) (uIU/mL)   Date Value   01/14/2021 7.090 (H)       HEMOGLOBIN A1C (%)   Date Value   06/13/2020 6.2 (H)       No results found for: POCA1C      No results found for: INR    SODIUM (mmol/L)   Date Value   01/14/2021 141       POTASSIUM (mmol/L)   Date Value   01/14/2021 3.8           CREATININE (mg/dL)   Date Value   79/15/0413 1.2       Documented patient preferred pharmacies:    [facility] [place], [place] - [address]  Phone: [phone] Fax: [phone]

## 2021-05-14 NOTE — Telephone Encounter (Signed)
PER Pharmacy, Sara Snyder is a 65 year old female has requested a refill of      -  Venlafaxine XR 150 mg        Last Office Visit:02/25/2021   with King,S  Last Physical Exam: 06/13/2020     FECAL OCCULT BLOOD AGE 65+ due on 12/03/2018     Other Med Adult:  Most Recent BP Reading(s)  02/26/21 : 152/70        Cholesterol (mg/dL)   Date Value   21/04/5519 215     LOW DENSITY LIPOPROTEIN DIRECT (mg/dL)   Date Value   80/22/3361 141     HIGH DENSITY LIPOPROTEIN (mg/dL)   Date Value   22/44/9753 43     TRIGLYCERIDES (mg/dL)   Date Value   00/51/1021 148         THYROID SCREEN TSH REFLEX FT4 (uIU/mL)   Date Value   02/25/2021 0.353         TSH (THYROID STIM HORMONE) (uIU/mL)   Date Value   01/14/2021 7.090 (H)       HEMOGLOBIN A1C (%)   Date Value   06/13/2020 6.2 (H)       No results found for: POCA1C      No results found for: INR    SODIUM (mmol/L)   Date Value   01/14/2021 141       POTASSIUM (mmol/L)   Date Value   01/14/2021 3.8           CREATININE (mg/dL)   Date Value   11/73/5670 1.2        Documented patient preferred pharmacies:    [facility] [place],  - [address]  Phone: [phone] Fax: [phone]

## 2021-05-14 NOTE — Telephone Encounter (Signed)
PER Patient (self), Sara Snyder is a 65 year old female has requested a refill of chlorthalidone.    This refill request failed protocol because eGFR <55 recent BP readings above goal.      Last Office Visit: 02/25/2021 with Melony Overly  Last Physical Exam: 06/13/2020    FECAL OCCULT BLOOD AGE 9+ due on 12/03/2018    Other Med Adult:  Most Recent BP Reading(s)  02/26/21 : 152/70        Cholesterol (mg/dL)   Date Value   06/13/2020 215     LOW DENSITY LIPOPROTEIN DIRECT (mg/dL)   Date Value   06/13/2020 141     HIGH DENSITY LIPOPROTEIN (mg/dL)   Date Value   06/13/2020 43     TRIGLYCERIDES (mg/dL)   Date Value   06/13/2020 148         THYROID SCREEN TSH REFLEX FT4 (uIU/mL)   Date Value   02/25/2021 0.353         TSH (THYROID STIM HORMONE) (uIU/mL)   Date Value   01/14/2021 7.090 (H)       HEMOGLOBIN A1C (%)   Date Value   06/13/2020 6.2 (H)       No results found for: POCA1C      No results found for: INR    SODIUM (mmol/L)   Date Value   01/14/2021 141       POTASSIUM (mmol/L)   Date Value   01/14/2021 3.8           CREATININE (mg/dL)   Date Value   01/14/2021 1.2       Documented patient preferred pharmacies:    [facility], [place] - [place].  Phone: [phone] Fax: [phone]

## 2021-05-16 ENCOUNTER — Other Ambulatory Visit (HOSPITAL_BASED_OUTPATIENT_CLINIC_OR_DEPARTMENT_OTHER): Payer: Self-pay | Admitting: "Psychiatric/Mental Health

## 2021-05-16 MED ORDER — CLONAZEPAM 1 MG PO TABS
ORAL_TABLET | ORAL | 0 refills | Status: DC
Start: 2021-05-16 — End: 2021-06-12

## 2021-05-16 MED ORDER — CHLORTHALIDONE 25 MG PO TABS
25.0000 mg | ORAL_TABLET | Freq: Every day | ORAL | 0 refills | Status: DC
Start: 2021-05-16 — End: 2021-06-09

## 2021-05-16 MED ORDER — VENLAFAXINE HCL ER 150 MG PO CP24
ORAL_CAPSULE | ORAL | 2 refills | Status: DC
Start: 2021-05-16 — End: 2021-07-29

## 2021-05-16 MED FILL — VENLAFAXINE 150MG ER: 30 days supply | Qty: 30 | Fill #0

## 2021-05-17 MED FILL — CLONAZEPAM 1MG: 30 days supply | Qty: 60 | Fill #0

## 2021-05-20 MED FILL — LORATADINE  10MG: 90 days supply | Qty: 90 | Fill #1

## 2021-06-03 MED FILL — CHLORTHALID 25MG: 90 days supply | Qty: 90 | Fill #0

## 2021-06-04 MED FILL — AMLOD/VALSAR 5-320MG: 90 days supply | Qty: 90 | Fill #1

## 2021-06-05 ENCOUNTER — Other Ambulatory Visit: Payer: Self-pay

## 2021-06-05 ENCOUNTER — Ambulatory Visit: Payer: No Typology Code available for payment source | Attending: Otolaryngology | Admitting: Otolaryngology

## 2021-06-05 VITALS — BP 126/68 | HR 67 | Temp 98.0°F

## 2021-06-05 DIAGNOSIS — R040 Epistaxis: Secondary | ICD-10-CM

## 2021-06-05 DIAGNOSIS — R0981 Nasal congestion: Secondary | ICD-10-CM

## 2021-06-05 DIAGNOSIS — N1831 Chronic kidney disease, stage 3a: Secondary | ICD-10-CM | POA: Diagnosis present

## 2021-06-05 MED ORDER — MONTELUKAST SODIUM 10 MG PO TABS
10.0000 mg | ORAL_TABLET | Freq: Every evening | ORAL | 6 refills | Status: DC
Start: 2021-06-05 — End: 2021-10-01

## 2021-06-05 MED ORDER — MUPIROCIN 2 % EX OINT
TOPICAL_OINTMENT | Freq: Three times a day (TID) | CUTANEOUS | 3 refills | Status: AC
Start: 2021-06-05 — End: 2021-07-05

## 2021-06-05 MED FILL — MONTELUKAST 10MG: 30 days supply | Qty: 30 | Fill #0 | Status: CP

## 2021-06-05 MED FILL — MUPIROCIN  OIN 2%: 15 days supply | Qty: 22 | Fill #0 | Status: CP

## 2021-06-05 NOTE — Progress Notes (Signed)
HPI: 65 year old female Sara Snyder complains of chronic nasal congestion and pain and with inside cracking as well.She blows her nose as well.    This interview was conducted with the aid of an telephone interpreter, [place].    Pos hx of longstanding nasal congestion and issues with nasal airway obstruction. Pos for many years and in the winter more often than in the other seasons. Positive for allergies as well and with pollen issues as well. Positive as well in terms of chronic rhinitis and with no rx in the past as well.     Symptom Relief No  Symptom Exacerbation No    PMH of Asthma  No.  PMHx of Allergy testing and Treatment:     Past Medical History:  09/11/2018: Chronic kidney disease, stage III (moderate) (HCC)  No date: HTN (hypertension)  01/05/2018: Mild episode of recurrent major depressive disorder ([place])  02/20/2021: Osteopenia  11/29/2017: Panic disorder  No date: Wears eyeglasses    Review of Patient's Allergies indicates:   Pollen extract          Cough    venlafaxine (EFFEXOR-XR) 150 MG 24 hr capsule, Take one capsule daily by mouth note dose reduction.  (150 mg total daily), Disp: 30 capsule, Rfl: 2  chlorthalidone (HYGROTEN) 25 MG tablet, Take 1 tablet by mouth in the morning., Disp: 90 tablet, Rfl: 0  clonazePAM (KLONOPIN) 1 MG tablet, 1 tablet twice daily, PRN, Disp: 60 tablet, Rfl: 0  amlodipine-valsartan (EXFORGE) 5-320 MG per tablet, TAKE 1 TABLET BY MOUTH DAILY, Disp: 90 tablet, Rfl: 1  loratadine (CLARITIN) 10 MG tablet, Take 1 tablet by mouth daily as needed for Allergies (and nose congestion), Disp: 90 tablet, Rfl: 1  levothyroxine (SYNTHROID) 75 MCG tablet, Take 1 tablet by mouth every morning before breakfast Note dose increase, stop other dose of this medicine., Disp: 30 tablet, Rfl: 2  omeprazole (PRILOSEC) 20 MG capsule, Take 1 capsule by mouth daily, Disp: 90 capsule, Rfl: 3  Calcium Carb-Cholecalciferol 500-400 MG-UNIT TABS, Take 1 tablet by mouth daily with lunch, Disp: 90 tablet,  Rfl: 3  azelastine (ASTELIN) 0.1 % nasal spray, 1 spray by Each Nostril route 2 (two) times daily Use in each nostril as directed, Disp: 30 mL, Rfl: 3  cyclobenzaprine (FLEXERIL) 5 MG tablet, Take 1 tablet by mouth nightly, Disp: 20 tablet, Rfl: 0  lidocaine (LIDODERM) 5 % patch, Place 1 patch onto the skin daily, Disp: 30 patch, Rfl: 2  cholecalciferol (VITAMIN D3) 2000 UNIT tablet, Take 1 tablet by mouth 2 (two) times daily, Disp: 180 tablet, Rfl: 3  ramelteon (ROZEREM) 8 MG tablet, Take 1 tablet by mouth nightly, Disp: 30 tablet, Rfl: 3  atenolol (TENORMIN) 100 MG tablet, Take 1 tablet by mouth nightly, Disp: 90 tablet, Rfl: 3  Multiple Vitamins-Minerals (HAIR SKIN AND NAILS FORMULA) TABS, Take 1 capsule by mouth daily, Disp: 30 tablet, Rfl: 12    No current facility-administered medications on file prior to visit.      Social and family history reviewed either in EPIC or with patient and are not contributory unless specifically noted in HPI.    Family History of Allergies No.    Smoking: No.    Review of Systems  Constitutional normal.  Eyes/Blurry vision No.  CV/Palpitations No.  Resp/SOB No.  Endocrine/Feeling tired No.  Integ/Skin lesions No.  Psych normal.  GI/Heartburn No.  Neurologic normal.  Hem/Easy Bruising No.  Environmental Allergy No.    Physical Exam  06/05/21  0957   BP: 126/68   Pulse: 67   Temp: 98 F (36.7 C)       General: WD WN female with a normal voice.  Face: No lesions.  Facial Strength: Normal and symmetric.  Eyes: PERRLA and EOMI.  Ear R: No lesions. Cerumen: none / minimal. Canal clear. TM clear.   Ear L:  No lesions. Cerumen: none / minimal. Canal clear. TM clear.  Procedure: Nasal Endoscopy  Indications: Hx of nasal congestion and with nosebleeds, need to evaluate nasal anatomy not easily seen on anterior rhinoscopy and need to use a Hopkins rigid endoscope.     Risks of Nasal endoscopy discussed with patient including: nose bleed, pain, infection. Risk of nasal decongestion  includes anaphylactic reaction to medication, foul taste.    Afrin and Lidocaine B/L  FOE/0 30 Hopkins Rod into Right and Left Nasal Passage  Right:  Nasal Mucosa inflammed, Septum deviated to the left side and with >90%nasal obstruction, IT and MT normal, OMC normal, olfactory cleft normal, no polyps or lesions seen, NP Torus normal, Fossa or Rosenmueller Clear, Adenoid bed normal  Left: Nasal Mucosa inflammed, Septum as above, IT and MT normal, OMC normal, olfactory cleft normal, no polyps or lesions seen, NP Torus normal, Fossa or Rosenmueller Clear, Adenoid bed normal     Oral Cavity/Oropharynx:  Mucous membranes moist.  Tonsils 0+.  Teeth in good condition.  Tongue no lesions.  Neck:  Trachea midline.  No palpable masses.  Thyroid: Normal to palpation.  Lymph:  No palpable nodes.  Subman glands:  Normal size, non-tender.  Parotids: Normal size, no masses or nodes.    Data reviewed for today's visit:   External notes, reviewed, including from prior visits.   Test results: Positive for kidney issues and may have bleeding issues.   Tests ordered:  Independent historian required: No.  Independent test interpretation performed: No.  Discussion with other HCP: No.    A/P  65 year old female was evaluated and we recommend the following:    (R04.0) Epistaxis  (primary encounter diagnosis)  Comment: mild and on tissue, has not had to go to ER but blows her nose so much will get worse. Will go ahead  Plan: Avoid nose blowing as may get worse and may end up in ER so really emphasized preventive nasal hygiene.    (R09.81) Nasal congestion  Plan: getting worse and she cannot sleep and will start saline rinse and also use singulair as well- The patient will start Singulair 10 mg nightly for allergic rhinitis given their symptoms of nasal congestion, rhinorrhea, post nasal drip which has not completely responded to treatment with antihistamines and/or nasal steroid sprays. The MOA of Singulair was discussed as well as the  possible side effects including depression, anxiety, and the black box warning of suicidal ideation.  If not improved, may need to be a candidate for allergy testing or trying other medications.   And will go ahead and use the saline rinse as well bactroban ointment as well.      (N18.31) Stage 3a chronic kidney disease  Comment: long standing and stable  Plan: if worsens, can lead to issues with coagulation as well. Went over entire plan with her daughter who was presnt as well.      I spent a total of 60 minutes on this visit on the date of service -incl review of congestion and records and with exam and plan for patient.  Claudie Revering, MD, MBA  Otolaryngology- Head and Neck Surgery

## 2021-06-07 ENCOUNTER — Other Ambulatory Visit (HOSPITAL_BASED_OUTPATIENT_CLINIC_OR_DEPARTMENT_OTHER): Payer: Self-pay | Admitting: Internal Medicine

## 2021-06-07 DIAGNOSIS — I10 Essential (primary) hypertension: Secondary | ICD-10-CM

## 2021-06-09 MED FILL — VENLAFAXINE 150MG ER: 30 days supply | Qty: 30 | Fill #1

## 2021-06-09 NOTE — Telephone Encounter (Signed)
PER Pharmacy,  Sara Snyder is a 65 year old female who has requested a refill of chlorthalidone.    This refill request failed protocol because most recent eGFR resulted in <55 and medication was just ordered within the past 45 days.       - last office visit: 02/25/21 with Melony Overly  - last physical exam: 06/13/20    FECAL OCCULT BLOOD AGE 21+ due on 12/03/2018      HTN Med:    Most Recent BP Reading(s)  06/05/21 : 126/68  02/26/21 : 152/70  02/25/21 : (!) 148/78        Documented patient preferred pharmacies:    [place], [place].  Phone: [phone] Fax: [phone]

## 2021-06-12 ENCOUNTER — Encounter (HOSPITAL_BASED_OUTPATIENT_CLINIC_OR_DEPARTMENT_OTHER): Payer: Self-pay | Admitting: "Psychiatric/Mental Health

## 2021-06-12 ENCOUNTER — Encounter (HOSPITAL_BASED_OUTPATIENT_CLINIC_OR_DEPARTMENT_OTHER): Payer: Self-pay

## 2021-06-12 ENCOUNTER — Other Ambulatory Visit (HOSPITAL_BASED_OUTPATIENT_CLINIC_OR_DEPARTMENT_OTHER): Payer: Self-pay | Admitting: "Psychiatric/Mental Health

## 2021-06-12 MED ORDER — CLONAZEPAM 1 MG PO TABS
ORAL_TABLET | ORAL | 0 refills | Status: DC
Start: 2021-06-12 — End: 2021-07-09

## 2021-06-12 MED FILL — CLONAZEPAM 1MG: 30 days supply | Qty: 60 | Fill #0

## 2021-06-12 NOTE — Telephone Encounter (Signed)
Duplicate request

## 2021-07-08 MED FILL — VENLAFAXINE 150MG ER: 30 days supply | Qty: 30 | Fill #2

## 2021-07-09 ENCOUNTER — Ambulatory Visit: Payer: No Typology Code available for payment source | Admitting: "Psychiatric/Mental Health

## 2021-07-09 ENCOUNTER — Encounter (HOSPITAL_BASED_OUTPATIENT_CLINIC_OR_DEPARTMENT_OTHER): Payer: Self-pay | Admitting: "Psychiatric/Mental Health

## 2021-07-09 DIAGNOSIS — F3341 Major depressive disorder, recurrent, in partial remission: Secondary | ICD-10-CM | POA: Diagnosis present

## 2021-07-09 DIAGNOSIS — F410 Panic disorder [episodic paroxysmal anxiety] without agoraphobia: Secondary | ICD-10-CM | POA: Diagnosis present

## 2021-07-09 DIAGNOSIS — F411 Generalized anxiety disorder: Secondary | ICD-10-CM

## 2021-07-09 MED ORDER — CLONAZEPAM 1 MG PO TABS
ORAL_TABLET | ORAL | 0 refills | Status: DC
Start: 2021-07-09 — End: 2021-08-05

## 2021-07-09 NOTE — Progress Notes (Signed)
ADULT OUTPATIENT PSYCHIATRY (OPD)  PSYCHOPHARM FOLLOW UP    LANGUAGE OF CARE:    [place]      LANGUAGE NEEDS MET:    Telephone Interpreter    CHIEF COMPLAINT:     SUBJECTIVE DATA/INTERVAL HISTORY:   Sara Snyder is assessed in a psychopharm f/u televisit with a [place] interpreter.  "I"m well, thank you."  "I'm watching my grandchildren while their parents work."   She reports she cooks, does chores, cares for grandchildren, and watches TV.   Reviewed meds:   Effexor 150 mg AM; Klonopin 1 mg BID, PRN(every night, and unable to say how many times used as a 2nd dose/week).   Her knees still bothering her -"I have pain in my bones.".  She is fully vaccinated; booster on 12/23 + 01/2021.  She is not tangential. reports she is "good"; "a little bit sometimes" depression and anxiety; no sadness, irritability; worry, anhedonia, hopelessness, anger, guilt, racing thoughts, rumination, or SI. ; Energy, motivation, appetite, and sleep are normal. She is not hypomanic or overtly psychotic..   Med compliant.        OBJECTIVE DATA:   CURRENT MEDICATIONS:    Current Outpatient Medications   Medication Sig    traZODone (DESYREL) 50 MG tablet 1/2 - 1 tablet HS, PRN    fluticasone (FLONASE) 50 MCG/ACT nasal spray 1 spray by Each Nostril route daily    benzonatate (TESSALON) 100 MG capsule Take 1 capsule by mouth 3 (three) times daily as needed for Cough  for up to 20 days    venlafaxine (EFFEXOR-XR) 37.5 MG 24 hr capsule 1 tablet AM along with 75 mg-total = 112.5 mg    venlafaxine (EFFEXOR-XR) 75 MG 24 hr capsule Take 1 capsule by mouth daily Take along with 37.5 mg. TDD=112.5 mg.    valsartan (DIOVAN) 320 MG tablet Take 1 tablet by mouth daily    clonazePAM (KLONOPIN) 1 MG tablet 1 tablet twice daily as needed    chlorthalidone (HYGROTEN) 50 MG tablet Take 1 tablet by mouth daily    atenolol (TENORMIN) 50 MG tablet One tab in am and one tab in pm    omeprazole (PRILOSEC) 20 MG capsule Take 1 capsule by mouth  daily    meclizine (ANTIVERT) 25 MG TABS Take 1 tablet by mouth every 8 (eight) hours as needed    Multiple Vitamins-Minerals (HAIR SKIN AND NAILS FORMULA) TABS Take 1 capsule by mouth daily    loratadine (CLARITIN) 10 MG tablet Take 1 tablet by mouth daily    calcium-vitamin D (OSCAL-500) 500-400 MG-UNIT per tablet Take 1 tablet by mouth 2 (two) times daily    azelastine (ASTELIN) 0.1 % nasal spray 1 spray by Each Nostril route 2 (two) times daily Use in each nostril as directed    acetaminophen (TYLENOL) 500 MG tablet Take 1 tablet by mouth every 6 (six) hours as needed for Pain     No current facility-administered medications for this visit.           VITAL SIGNS:   There were no vitals filed for this visit.     LABS:    WHITE BLOOD CELL COUNT (TH/uL)   Date Value   04/10/2019 5.8     RED BLOOD CELL COUNT (M/uL)   Date Value   04/10/2019 4.14     HEMOGLOBIN (g/dL)   Date Value   04/10/2019 12.2     No results found for: IDEALHCT  HEMATOCRIT (%)   Date Value  04/10/2019 37.5     No results found for: HCTDIFF  MEAN CORPUSCULAR VOL (fl)   Date Value   04/10/2019 90.6     MEAN CORPUSCULAR HGB (pg)   Date Value   04/10/2019 29.5     MEAN CORP HGB CONC (g/dL)   Date Value   04/10/2019 32.5     No results found for: RDW  PLATELET COUNT (TH/uL)   Date Value   04/10/2019 259     MEAN PLATELET VOLUME (fL)   Date Value   04/10/2019 10.7     NEUTROPHIL % (%)   Date Value   04/10/2019 56.9     LYMPHOCYTE % (%)   Date Value   04/10/2019 34.9     MONOCYTE % (%)   Date Value   04/10/2019 5.3     EOSINOPHIL % (%)   Date Value   04/10/2019 2.1     BASOPHIL % (%)   Date Value   04/10/2019 0.5           ALBUMIN (g/dL)   Date Value   04/10/2019 3.8     ALKALINE PHOSPHATASE (U/L)   Date Value   04/10/2019 117     ALANINE AMINOTRANSFERASE (U/L)   Date Value   04/10/2019 30     ASPARTATE AMINOTRANSFERASE (U/L)   Date Value   04/10/2019 21     Glucose Random (mg/dL)   Date Value   07/20/2019 99     BUN (UREA NITROGEN) (mg/dL)    Date Value   07/20/2019 17     CALCIUM (mg/dL)   Date Value   07/20/2019 9.0     CHLORIDE (mmol/L)   Date Value   07/20/2019 104     CARBON DIOXIDE (mmol/L)   Date Value   07/20/2019 30     CREATININE (mg/dL)   Date Value   07/20/2019 1.3 (H)     POTASSIUM (mmol/L)   Date Value   07/20/2019 3.9     SODIUM (mmol/L)   Date Value   07/20/2019 140     BILIRUBIN TOTAL (mg/dL)   Date Value   04/10/2019 0.3     TOTAL PROTEIN (g/dL)   Date Value   04/10/2019 7.4         THYROID SCREEN TSH REFLEX FT4   Date Value Ref Range Status   07/20/2019 4.710 (H) 0.358 - 3.740 uIU/mL Final     Comment:     **As per the Thyroid Screen protocol, an FT4 test is  automatically ordered for all patients with abnormal TSH  results unless the FT4 has already been performed on this  patient within the last 168 hours.        Cholesterol (mg/dL)   Date Value   04/10/2019 223     LOW DENSITY LIPOPROTEIN DIRECT (mg/dL)   Date Value   04/10/2019 159     HIGH DENSITY LIPOPROTEIN (mg/dL)   Date Value   04/10/2019 43     TRIGLYCERIDES (mg/dL)   Date Value   04/10/2019 169 (H)       HEMOGLOBIN A1C (%)   Date Value   07/20/2019 5.9 (H)     No results found for: POCA1C    RPR QUAL   Date Value Ref Range Status   07/20/2019 Non Reactive Non Reactive Final     Comment:     Performed at:  [facility]  [address], [place], [place]  [id number]  Lab Director: Allyn Kenner  MD, Phone:  [phone]       No results found for: HIVAB  No results found for: VAL  No results found for: CLOZ     Pregnancy/Birth Control (for female patients): N/A    CURRENT TREATMENT/CONTACT INFO FOR OTHER AGENCIES AND [place] (if applicable):    Contact/Community Support    No documentation.         COLUMBIARISK ASSESSMENTS:   Suicide:  C-SSRS OP Last Contact Screener - 08/16/19 1542        C-SSRS OP Last Contact Screener    Have you wished you were dead or wished you could go to sleep and not wake up?  No     Have you actually had any thoughts of killing  yourself?   No     Have you done anything, started to do anything, or prepared to do anything to end your life?  No     C-SSRS OP Last Contact Screener Risk Score  No Risk         C-SSRS Risk Assessment    No documentation.         VIOLENCE:   Violence/Abuse Risk    No documentation.          MENTAL STATUS EXAMINATION:  Pleasant; speech-nl rate/tone; attent/conc-nl; mood-"good"; depression-"a little bit sometimes"; anxiety-"sometimes but a loss less than before"; panic-no; irritability/impatience-no; sadness-no; anger-no; anhedonia-no; sleep-to bed at 11PM-11:30PM-initial insomnia-no, wakings-for BR, EMA-no, difficulty w/ OOB and facing the day-up at 8:30-9AM; appetite-nl; energy-nl motivation-nl; isol/withdr-no; fatigue-naps; hopelessness-no; guilt-no; no-SI/HI. Coherent and less tangential; thought process/associations-fairly organized; thought content-no overt psychosis; fund of knowledge-grossly intact; memory-grossly intact; racing thoughts-no; rumination-no; worry-"just normal stuff"; no-AH,VH,CH,OH,TH;paranoia-no;delusions-no; IOR-no; thought broadcasting/insertion/removal-no; A+Ox3. I/J-nl      RISK ASSESSMENTS:     Violence: low (1)     Addiction: low (1)     BIO/PSYCHO/SOCIAL AND RISK FORMULATION(S): 66 y/o [place] female with longstanding MDD, recurrent, GAD, and Panic D/O now in need of psychopharm follow up,and counseling though she has dropped out of same in the past.    DIAGNOSES:    MDD, recurrent, in partial remission; GAD; Panic D/O    PLAN: Continue meds as ordered. Next appt-4/5  Effexor XR 150 mg AM - reduced by PCP  Klonopin 1 mg twice daily, PRN - takes twice daily   .    Rozerem 8 mg HS - Rx'd by PCP  Trazodone 100 mg HS - D/C'd by PCP     INFORMED CONSENT - for any new medication:   N/A     CARE PLAN/ EPISODES:  Linked Episodes   Type: Episode: Status: Noted: Resolved: Last update: Updated by:   Amery Active 08/16/2019  08/16/2019  3:39 PM Felix Pacini,  RNCS      Comments:       COUNSELING AND COORDINATION OF CARE PROVIDED:   I spent a total of 40 minutes on this visit on the date of service (total time includes all activities performed on the date of service)      [place], RNCS

## 2021-07-14 ENCOUNTER — Ambulatory Visit
Admission: RE | Admit: 2021-07-14 | Discharge: 2021-07-14 | Disposition: A | Discharge status: Home / Self Care | Payer: No Typology Code available for payment source | Attending: Student in an Organized Health Care Education/Training Program | Admitting: Student in an Organized Health Care Education/Training Program

## 2021-07-14 ENCOUNTER — Ambulatory Visit (HOSPITAL_BASED_OUTPATIENT_CLINIC_OR_DEPARTMENT_OTHER)
Payer: No Typology Code available for payment source | Admitting: Student in an Organized Health Care Education/Training Program

## 2021-07-14 ENCOUNTER — Other Ambulatory Visit: Payer: Self-pay

## 2021-07-14 ENCOUNTER — Ambulatory Visit (HOSPITAL_BASED_OUTPATIENT_CLINIC_OR_DEPARTMENT_OTHER)
Admit: 2021-07-14 | Discharge: 2021-07-14 | Disposition: A | Discharge status: Home / Self Care | Payer: No Typology Code available for payment source

## 2021-07-14 ENCOUNTER — Encounter (HOSPITAL_BASED_OUTPATIENT_CLINIC_OR_DEPARTMENT_OTHER): Payer: Self-pay | Admitting: Student in an Organized Health Care Education/Training Program

## 2021-07-14 VITALS — BP 142/68 | HR 70 | Ht 58.66 in | Wt 198.0 lb

## 2021-07-14 DIAGNOSIS — M79601 Pain in right arm: Secondary | ICD-10-CM

## 2021-07-14 DIAGNOSIS — G8929 Other chronic pain: Secondary | ICD-10-CM

## 2021-07-14 DIAGNOSIS — Z23 Encounter for immunization: Secondary | ICD-10-CM

## 2021-07-14 DIAGNOSIS — M25511 Pain in right shoulder: Secondary | ICD-10-CM | POA: Insufficient documentation

## 2021-07-14 DIAGNOSIS — R531 Weakness: Secondary | ICD-10-CM | POA: Insufficient documentation

## 2021-07-14 DIAGNOSIS — M25811 Other specified joint disorders, right shoulder: Secondary | ICD-10-CM | POA: Diagnosis present

## 2021-07-14 DIAGNOSIS — M19011 Primary osteoarthritis, right shoulder: Secondary | ICD-10-CM

## 2021-07-14 DIAGNOSIS — M25512 Pain in left shoulder: Secondary | ICD-10-CM | POA: Diagnosis present

## 2021-07-14 DIAGNOSIS — Z139 Encounter for screening, unspecified: Secondary | ICD-10-CM | POA: Diagnosis present

## 2021-07-14 DIAGNOSIS — N2581 Secondary hyperparathyroidism of renal origin: Secondary | ICD-10-CM

## 2021-07-14 DIAGNOSIS — M25519 Pain in unspecified shoulder: Secondary | ICD-10-CM

## 2021-07-14 DIAGNOSIS — M79602 Pain in left arm: Secondary | ICD-10-CM | POA: Diagnosis present

## 2021-07-14 DIAGNOSIS — E039 Hypothyroidism, unspecified: Secondary | ICD-10-CM

## 2021-07-14 DIAGNOSIS — M19012 Primary osteoarthritis, left shoulder: Secondary | ICD-10-CM | POA: Diagnosis present

## 2021-07-14 LAB — CBC WITH PLATELET
ABSOLUTE NRBC COUNT: 0 10*3/uL (ref 0.0–0.0)
HEMATOCRIT: 34.5 % (ref 34.1–44.9)
HEMOGLOBIN: 11.4 g/dL (ref 11.2–15.7)
MEAN CORP HGB CONC: 33 g/dL (ref 31.0–37.0)
MEAN CORPUSCULAR HGB: 29.4 pg (ref 26.0–34.0)
MEAN CORPUSCULAR VOL: 88.9 fl (ref 80.0–100.0)
MEAN PLATELET VOLUME: 10.7 fL (ref 8.7–12.5)
NRBC %: 0 % (ref 0.0–0.0)
PLATELET COUNT: 303 10*3/uL (ref 150–400)
RBC DISTRIBUTION WIDTH STD DEV: 43 fL (ref 35.1–46.3)
RED BLOOD CELL COUNT: 3.88 M/uL — ABNORMAL LOW (ref 3.90–5.20)
WHITE BLOOD CELL COUNT: 5.5 10*3/uL (ref 4.0–11.0)

## 2021-07-14 LAB — HEMOGLOBIN A1C
ESTIMATED AVERAGE GLUCOSE: 134 mg/dL (ref 74–160)
HEMOGLOBIN A1C: 6.3 % — ABNORMAL HIGH (ref 4.0–5.6)

## 2021-07-14 MED ORDER — LIDOCAINE 5 % EX PTCH
1.0000 | MEDICATED_PATCH | CUTANEOUS | 2 refills | Status: DC
Start: 2021-07-14 — End: 2022-02-27

## 2021-07-14 MED FILL — LIDODERM   DIS 5%: 30 days supply | Qty: 30 | Fill #0

## 2021-07-14 NOTE — Progress Notes (Signed)
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, does not think related to her work  - described as starts in the shoulder and travels down to the elbow joint, radiates through the arm; sometimes can't even pick up a pot filled with water  - when walking, gets pain in her hips  - knees also hurt  - L foot also hurting   - for the pain has tried ibuprofen    Per daughter:  - has come to the hospital several times, had bones/legs exams  - notes decreased grip strength  - pain keeps her from walking or going out    Seen for multiple joint pain in 2020, rx'ed calcium/vit D and APAP    Atenolol, chlorthalidone, amlodipine-valsartan, clonazepam, venlafaxine  Ibuprofen prn  Not taking levothyroxine    REVIEW OF SYSTEMS: negative or noted above    Current Outpatient Medications   Medication Sig    lidocaine (LIDODERM) 5 % patch Place 1 patch onto the skin in the morning.    clonazePAM (KLONOPIN) 1 MG tablet 1 tablet twice daily, PRN    chlorthalidone (HYGROTEN) 25 MG tablet TAKE 1 TABLET BY MOUTH IN THE MORNING.    montelukast (SINGULAIR) 10 MG tablet Take 1 tablet by mouth nightly    venlafaxine (EFFEXOR-XR) 150 MG 24 hr capsule Take one capsule daily by mouth note dose reduction.  (150 mg total daily)    amlodipine-valsartan (EXFORGE) 5-320 MG per tablet TAKE 1 TABLET BY MOUTH DAILY    loratadine (CLARITIN) 10 MG tablet Take 1 tablet by mouth daily as needed for Allergies (and nose congestion)    omeprazole (PRILOSEC) 20 MG capsule Take 1 capsule by mouth daily    Calcium Carb-Cholecalciferol 500-400 MG-UNIT TABS Take 1 tablet by mouth daily with lunch     azelastine (ASTELIN) 0.1 % nasal spray 1 spray by Each Nostril route 2 (two) times daily Use in each nostril as directed    cholecalciferol (VITAMIN D3) 2000 UNIT tablet Take 1 tablet by mouth 2 (two) times daily    atenolol (TENORMIN) 100 MG tablet Take 1 tablet by mouth nightly    Multiple Vitamins-Minerals (HAIR SKIN AND NAILS FORMULA) TABS Take 1 capsule by mouth daily     No current facility-administered medications for this visit.       OBJECTIVE:  BP (!) 142/68    Pulse 70    Ht 4' 10.66" (1.49 m)    Wt 89.8 kg (198 lb)    SpO2 97%    BMI 40.45 kg/m   Gen: Well-appearing, speaking normally  HEENT: Head atraumatic, moist mucous membranes, normal conjunctiva  CV: regular rate and rhythm, no murmurs/rubs/gallops, no pitting edema of lower extremities  Pulm: No increased work of breathing, lungs clear to auscultation, no wheezes/rales/rhonchi  Abd: normoactive bowel sounds, no tenderness to palpation  Skin: warm and well perfused  Neuro: Alert and oriented, no gross focal motor deficits  MSK: no TTP of cervical spine, full neck ROM, grip strength 5/5 bilat, 5.5 strength BLE, decreased abduction in R shoulder, pain  with internal rotation, pain to palpation of muscles of biceps and forearms, diffuse  Psych: Normal speech and thought process    LABS/IMAGING:    ASSESSMENT AND PLAN:    Problem List        Infectious Diseases    Need for prophylactic vaccination and inoculation against influenza    Relevant Orders    IMMUNIZATION ADMIN SINGLE (Completed)    INFLUENZA VACCINE HIGH DOSE FOR 65 AND OLDER, 0.5ML, IM (Completed)       Musculoskeletal and Injuries    Bilateral arm pain    Overview     07/14/2021 presents w daughter for bilateral shoulder/elbow arm pain worsening over past year; ibuprofen providing incomplete relief, full strength/sensation on exam, decreased shoulder ROM, will assess for OA w XR, refer to PT, trial lidocaine patch; broad differential includes rotator cuff pathology, OA, cervical  stenosis, regional pain syndrome, FM              Return precautions discussed with patient. Patient verbalizes understanding and is in agreement with plan.      Judithe Modest, MD  Family Medicine

## 2021-07-14 NOTE — Patient Instructions (Addendum)
Referral Scheduling:    Call [phone] to schedule with rehab / physical therapy    Schedule a visit for your flu shot

## 2021-07-14 NOTE — Progress Notes (Signed)
Covid-19 Vaccine Administration:    Patient screened according to guidelines. Vaccine Information Fact Sheet given prior to administration and all patient/parent questions addressed. Covid-19 vaccine given without incident.  Possible side effects reviewed.  Return to clinic for next scheduled dose if indicated.      1. Has the patient received the information for the influenza vaccine? Yes    2. Does the patient have any of the following contraindications?  Allergy to eggs? No  Allergic reaction to previous influenza vaccines? No  Any other problems to previous influenza vaccines? No  Paralyzed by Guillain-Barre syndrome?  No  Current moderate or severe illness? No  Allergy to contact lens solution? No    3. The vaccine has been administered in the usual fashion.     Immunization information reviewed. Current VIS reviewed and given to patient/ guardian. Verbal assent obtained from patient/ guardian.  See immunization/Injection module or chart review for date of publication and additional information. Verbal assent obtained from patient/guardian. Comfort measures for possible side effects reviewed.

## 2021-07-16 ENCOUNTER — Telehealth (HOSPITAL_BASED_OUTPATIENT_CLINIC_OR_DEPARTMENT_OTHER): Payer: Self-pay | Admitting: Registered Nurse

## 2021-07-16 DIAGNOSIS — E038 Other specified hypothyroidism: Secondary | ICD-10-CM

## 2021-07-16 DIAGNOSIS — N2581 Secondary hyperparathyroidism of renal origin: Secondary | ICD-10-CM

## 2021-07-16 LAB — BASIC METABOLIC PANEL
ANION GAP: 11 mmol/L (ref 10–22)
BUN (UREA NITROGEN): 23 mg/dL — ABNORMAL HIGH (ref 7–18)
CALCIUM: 9.7 mg/dL (ref 8.5–10.5)
CARBON DIOXIDE: 30 mmol/L (ref 21–32)
CHLORIDE: 98 mmol/L (ref 98–107)
CREATININE: 1.2 mg/dL (ref 0.4–1.2)
ESTIMATED GLOMERULAR FILT RATE: 50 mL/min — ABNORMAL LOW (ref >60)
Glucose Random: 98 mg/dL (ref 74–160)
POTASSIUM: 3.8 mmol/L (ref 3.5–5.1)
SODIUM: 139 mmol/L (ref 136–145)

## 2021-07-16 LAB — PTH INTACT WITH CALCIUM
PTH INTACT CALCIUM: 9.7 mg/dL (ref 8.5–10.5)
PTH INTACT: 65 pg/mL (ref 15–65)

## 2021-07-16 LAB — CHOLESTEROL: Cholesterol: 215 mg/dL (ref 0–239)

## 2021-07-16 LAB — HIGH DENSITY LIPOPROTEIN: HIGH DENSITY LIPOPROTEIN: 44 mg/dL (ref 40–60)

## 2021-07-16 LAB — THYROID SCREEN TSH REFLEX FT4: THYROID SCREEN TSH REFLEX FT4: 7.57 u[IU]/mL — ABNORMAL HIGH (ref 0.270–4.200)

## 2021-07-16 LAB — VITAMIN D,25 HYDROXY: VITAMIN D,25 HYDROXY: 27 ng/mL — ABNORMAL LOW (ref 30.0–100.0)

## 2021-07-16 LAB — FREE THYROXINE: FREE THYROXINE: 0.99 ng/dL (ref 0.93–1.70)

## 2021-07-16 LAB — LOW DENSITY LIPOPROTEIN DIRECT: LOW DENSITY LIPOPROTEIN DIRECT: 153 mg/dL (ref 0–189)

## 2021-07-16 MED ORDER — LEVOTHYROXINE SODIUM 50 MCG PO TABS
50.0000 ug | ORAL_TABLET | Freq: Every morning | ORAL | 0 refills | Status: DC
Start: 2021-07-16 — End: 2021-09-30

## 2021-07-16 NOTE — Telephone Encounter (Signed)
Spoke with pt and daughter  Informed of lab and shoulder xray results   Discussed low sugar/carbs diet  We reviewed cutting down on white carbs and trying whole wheat    Pt reports she has not taken thyroid medication for more than a month  Not sure why and which medication it is   Informed pt this is an important medication and should take daily on an empty stomach  Advised new dose of 50 mcg will be sent to the pharmacy and will need to repeat labs 4-6 weeks  Verbalized understanding  All questions answered    Dairl Ponder, RN, 07/16/2021

## 2021-07-16 NOTE — Progress Notes (Signed)
Dear RN,    Please:    1. Create Telephone encounter for this patient.  2. 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 She should still set up physical therapy, but I am also going to refer her to the orthopedic specialists for further evaluation.  Her a1c is still in the pre-diabetes range and has increased slightly, she should continue to focus on dietary changes and weight loss.  Her TSH (thyroid stimulating hormone) is mildly elevated. Can you confirm with her whether she is taking levothyroxine currently? At our visit she told me no, but 75 mcg was on her med list at the time.  Her kidney function and cholesterol are normal.    Plan:  1. Let me know about the levothyroxine-- if taking it currently and properly (on empty stomach), we will increase dose to 100 mcg and recheck labs in 4-6 wks; if not taking right now, we can restart her at 50 mcg and recheck labs in 4-6 wks.    2. Type of Outreach: 3 phone calls and if unable to reach send letter    3. Document the conversation in the Telephone Encounter and send the encounter back to me to complete orders and any further necessary documentation.     Thank you,  Carmelia Bake, MD

## 2021-07-16 NOTE — Telephone Encounter (Signed)
-----   Message from Carmelia Bake, MD sent at 07/16/2021  3:25 PM EST -----  Dear RN,    Please:    1. Create Telephone encounter for this patient.  2. 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 She should still set up physical therapy, but I am also going to refer her to the orthopedic specialists for further evaluation.  Her a1c is still in the pre-diabetes range and has increased slightly, she should continue to focus on dietary changes and weight loss.  Her TSH (thyroid stimulating hormone) is mildly elevated. Can you confirm with her whether she is taking levothyroxine currently? At our visit she told me no, but 75 mcg was on her med list at the time.  Her kidney function and cholesterol are normal.    Plan:  1. Let me know about the levothyroxine-- if taking it currently and properly (on empty stomach), we will increase dose to 100 mcg and recheck labs in 4-6 wks; if not taking right now, we can restart her at 50 mcg and recheck labs in 4-6 wks.    2. Type of Outreach: 3 phone calls and if unable to reach send letter    3. Document the conversation in the Telephone Encounter and send the encounter back to me to complete orders and any further necessary documentation.     Thank you,  Carmelia Bake, MD

## 2021-07-16 NOTE — Addendum Note (Signed)
Addended byCarmelia Bake on: 07/16/2021 03:37 PM     Modules accepted: Orders

## 2021-07-17 MED FILL — LEVOTHYROXIN 50MCG: 60 days supply | Qty: 60 | Fill #0

## 2021-07-29 ENCOUNTER — Other Ambulatory Visit (HOSPITAL_BASED_OUTPATIENT_CLINIC_OR_DEPARTMENT_OTHER): Payer: Self-pay | Admitting: Internal Medicine

## 2021-07-29 NOTE — Telephone Encounter (Signed)
PER Pharmacy, Yoanna Timpone is a 66 year old female has requested a refill of      - venlafaxine (EFFEXOR-XR) 150 MG 24 hr capsule      Last Office Visit: 07/14/21 with Bea Graff  Last Physical Exam: 06/13/20     FECAL OCCULT BLOOD AGE 58+ due on 12/03/2018     Other Med Adult:  Most Recent BP Reading(s)  07/14/21 : (!) 142/68        Cholesterol (mg/dL)   Date Value   07/14/2021 215     LOW DENSITY LIPOPROTEIN DIRECT (mg/dL)   Date Value   07/14/2021 153     HIGH DENSITY LIPOPROTEIN (mg/dL)   Date Value   07/14/2021 44     TRIGLYCERIDES (mg/dL)   Date Value   06/13/2020 148         THYROID SCREEN TSH REFLEX FT4 (uIU/mL)   Date Value   07/14/2021 7.570 (H)         TSH (THYROID STIM HORMONE) (uIU/mL)   Date Value   01/14/2021 7.090 (H)       HEMOGLOBIN A1C (%)   Date Value   07/14/2021 6.3 (H)       No results found for: POCA1C      No results found for: INR    SODIUM (mmol/L)   Date Value   07/14/2021 139       POTASSIUM (mmol/L)   Date Value   07/14/2021 3.8           CREATININE (mg/dL)   Date Value   07/14/2021 1.2        Documented patient preferred pharmacies:    [facility], [place] [facility] [place].  Phone: [phone] Fax: [phone]

## 2021-07-30 ENCOUNTER — Other Ambulatory Visit: Payer: Self-pay

## 2021-07-31 ENCOUNTER — Encounter (HOSPITAL_BASED_OUTPATIENT_CLINIC_OR_DEPARTMENT_OTHER): Payer: Self-pay | Admitting: Student in an Organized Health Care Education/Training Program

## 2021-07-31 ENCOUNTER — Ambulatory Visit
Payer: No Typology Code available for payment source | Attending: Student in an Organized Health Care Education/Training Program | Admitting: Student in an Organized Health Care Education/Training Program

## 2021-07-31 DIAGNOSIS — L821 Other seborrheic keratosis: Secondary | ICD-10-CM | POA: Diagnosis present

## 2021-07-31 DIAGNOSIS — Z79899 Other long term (current) drug therapy: Secondary | ICD-10-CM | POA: Diagnosis present

## 2021-07-31 DIAGNOSIS — L659 Nonscarring hair loss, unspecified: Secondary | ICD-10-CM | POA: Insufficient documentation

## 2021-07-31 MED ORDER — SPIRONOLACTONE 100 MG PO TABS
100.0000 mg | ORAL_TABLET | Freq: Every day | ORAL | 2 refills | Status: DC
Start: 2021-07-31 — End: 2021-11-07

## 2021-07-31 MED FILL — SPIRONOLACT 100MG: 90 days supply | Qty: 90 | Fill #0

## 2021-07-31 NOTE — Progress Notes (Signed)
HPI:     Sara Snyder is a 66 year old female who presents for hair loss.    Location: scalp hair loss  Duration: 3 years  Symptoms: diffuse hair loss  Timing: daily  Modifying factors: unknown  Treatments: none    Pt also c/o lesion on right shoulder present for years that is raised that is non painful or itchy.      No other dermatologic complaints or concerns today.    Personal history of non-melanoma skin cancer: no  Personal history of melanoma skin cancer: no  Family history of melanoma: no    Past Medical History:  Past Medical History:  09/11/2018: Chronic kidney disease, stage III (moderate) ([place])  No date: HTN (hypertension)  01/05/2018: Mild episode of recurrent major depressive disorder ([place])  02/20/2021: Osteopenia  11/29/2017: Panic disorder  No date: Wears eyeglasses    Current Medications:  Current Outpatient Medications   Medication Sig    venlafaxine (EFFEXOR-XR) 150 MG 24 hr capsule TAKE ONE CAPSULE DAILY BY MOUTH NOTE DOSE REDUCTION. (150 MG TOTAL DAILY)    spironolactone (ALDACTONE) 100 MG tablet Take 1 tablet by mouth in the morning.    levothyroxine (SYNTHROID) 50 MCG tablet Take 1 tablet by mouth every morning before breakfast    lidocaine (LIDODERM) 5 % patch Place 1 patch onto the skin in the morning.    clonazePAM (KLONOPIN) 1 MG tablet 1 tablet twice daily, PRN    chlorthalidone (HYGROTEN) 25 MG tablet TAKE 1 TABLET BY MOUTH IN THE MORNING.    montelukast (SINGULAIR) 10 MG tablet Take 1 tablet by mouth nightly    amlodipine-valsartan (EXFORGE) 5-320 MG per tablet TAKE 1 TABLET BY MOUTH DAILY    loratadine (CLARITIN) 10 MG tablet Take 1 tablet by mouth daily as needed for Allergies (and nose congestion)    omeprazole (PRILOSEC) 20 MG capsule Take 1 capsule by mouth daily    Calcium Carb-Cholecalciferol 500-400 MG-UNIT TABS Take 1 tablet by mouth daily with lunch    cholecalciferol (VITAMIN D3) 2000 UNIT tablet Take 1 tablet by mouth 2 (two) times daily    atenolol (TENORMIN) 100 MG  tablet Take 1 tablet by mouth nightly    Multiple Vitamins-Minerals (HAIR SKIN AND NAILS FORMULA) TABS Take 1 capsule by mouth daily     No current facility-administered medications for this visit.       Allergies:  Review of Patient's Allergies indicates:   Pollen extract          Cough    Reviewed outside medical records: no    Physical examination:    Examination of the following areas were performed:    [x]  Scalp  [x]  Head/Neck  [x]  Chest  [x]  Abdomen  [x]  Back  [x]  Upper extremities  [x]  Hands/Fingernails    All findings are within normal limits, EXCEPT:  Diffuse thinning of hair of scalp  Positive hair pull test   Waxy stuck on papule on right shoulder    Assessment/Plan:    // Androgenetic alopecia w/ telogen effluvium overlap  - Counseled patient on nature and course   - Pt with history of thyroid disease as well as vitamin D deficiency which can drive telogen effluvium. Emphasized importance of continued treatment of these underlying chronic conditions with PCP to help reduce internal stressors on body causing hair loss  - Recommended OTC Rogaine 5% BID to the scalp. Counseled patients on adverse effects of topical minoxidil including, but not limited to unwanted hair growth  -  eRx spironolactone 100 mg PO qD. Counseled pt on adverse effects including hyperkalemia, dizziness and hypotension  - Pt has hx of renal disease, will order f/u CMP in 1 month to monitor response to medication.    // Seborrheic keratosis, right shoulder  - benign appearing on exam today  - observation          Follow-up:    Patient to follow-up with Dr. Edison Simon in 3 months for hair loss    Vita Erm, MD  07/31/2021    No images are attached to the encounter.

## 2021-08-01 ENCOUNTER — Encounter (HOSPITAL_BASED_OUTPATIENT_CLINIC_OR_DEPARTMENT_OTHER): Payer: Self-pay | Admitting: Dermatology

## 2021-08-01 MED FILL — VENLAFAXINE 150MG ER: 30 days supply | Qty: 30 | Fill #0

## 2021-08-05 ENCOUNTER — Other Ambulatory Visit (HOSPITAL_BASED_OUTPATIENT_CLINIC_OR_DEPARTMENT_OTHER): Payer: Self-pay | Admitting: "Psychiatric/Mental Health

## 2021-08-05 ENCOUNTER — Encounter (HOSPITAL_BASED_OUTPATIENT_CLINIC_OR_DEPARTMENT_OTHER): Payer: Self-pay | Admitting: "Psychiatric/Mental Health

## 2021-08-05 ENCOUNTER — Encounter (HOSPITAL_BASED_OUTPATIENT_CLINIC_OR_DEPARTMENT_OTHER): Payer: Self-pay | Admitting: Internal Medicine

## 2021-08-05 MED ORDER — CLONAZEPAM 1 MG PO TABS
ORAL_TABLET | ORAL | 0 refills | Status: DC
Start: 2021-08-05 — End: 2021-09-24

## 2021-08-05 NOTE — Telephone Encounter (Signed)
PER Patient (self), Sara Snyder is a 66 year old female has requested a refill of      - venlafaxine (EFFEXOR-XR) 150 MG 24 hr capsule      Last Office Visit: 07/31/21 with Erfon E  Last Physical Exam: 06/13/20     FECAL OCCULT BLOOD AGE 31+ due on 12/03/2018     Other Med Adult:  Most Recent BP Reading(s)  07/14/21 : (!) 142/68        Cholesterol (mg/dL)   Date Value   23/76/2831 215     LOW DENSITY LIPOPROTEIN DIRECT (mg/dL)   Date Value   51/76/1607 153     HIGH DENSITY LIPOPROTEIN (mg/dL)   Date Value   37/03/6268 44     TRIGLYCERIDES (mg/dL)   Date Value   48/54/6270 148         THYROID SCREEN TSH REFLEX FT4 (uIU/mL)   Date Value   07/14/2021 7.570 (H)         TSH (THYROID STIM HORMONE) (uIU/mL)   Date Value   01/14/2021 7.090 (H)       HEMOGLOBIN A1C (%)   Date Value   07/14/2021 6.3 (H)       No results found for: POCA1C      No results found for: INR    SODIUM (mmol/L)   Date Value   07/14/2021 139       POTASSIUM (mmol/L)   Date Value   07/14/2021 3.8           CREATININE (mg/dL)   Date Value   35/00/9381 1.2        Documented patient preferred pharmacies:    [facility] [place], [place] - [address]  Phone: [phone] Fax: [phone]

## 2021-08-06 MED FILL — CLONAZEPAM 1MG: 30 days supply | Qty: 60 | Fill #0 | Status: CP

## 2021-08-07 MED ORDER — VENLAFAXINE HCL ER 150 MG PO CP24
ORAL_CAPSULE | ORAL | 1 refills | Status: DC
Start: 2021-08-07 — End: 2021-09-24

## 2021-08-20 ENCOUNTER — Ambulatory Visit: Payer: No Typology Code available for payment source | Attending: Orthopaedic Surgery | Admitting: Orthopaedic Surgery

## 2021-08-20 ENCOUNTER — Ambulatory Visit
Admission: RE | Admit: 2021-08-20 | Discharge: 2021-08-20 | Disposition: A | Discharge status: Home / Self Care | Payer: No Typology Code available for payment source | Attending: Student in an Organized Health Care Education/Training Program | Admitting: Student in an Organized Health Care Education/Training Program

## 2021-08-20 ENCOUNTER — Other Ambulatory Visit: Payer: Self-pay

## 2021-08-20 DIAGNOSIS — M79601 Pain in right arm: Secondary | ICD-10-CM | POA: Insufficient documentation

## 2021-08-20 DIAGNOSIS — R52 Pain, unspecified: Secondary | ICD-10-CM | POA: Diagnosis present

## 2021-08-20 MED ORDER — DICLOFENAC SODIUM 1 % EX GEL
4.00 g | Freq: Four times a day (QID) | CUTANEOUS | 0 refills | Status: AC
Start: 2021-08-20 — End: 2021-09-19

## 2021-08-20 MED FILL — DICLOFENAC GEL 1%: 6 days supply | Qty: 100 | Fill #0

## 2021-08-20 NOTE — Progress Notes (Signed)
Date of service:  08/20/2021    I have personally seen and examined the patient.    This is a 66 year old year old female who presents for multiple diffuse body pain complaints but today we focused on her shoulders and upper arms.  She has had chronic pain in her upper arms without any specific trauma.  She localizes the pain mostly in the upper arm but not as distinctly much in the shoulder.  She feels her weakness in the arms.  She has pain reportedly all over her body has seen her primary care providers for this in the past.  She denies numbness or tingling.    Physical examination:   General - the patient is alert and oriented x3, well appearing, in no acute distress.    Neck: Full motion.  Mild pain with lateral rotation and flexion.  Spurling sign negative.  Bilateral shoulders, elbows demonstrates full active range of motion.  There is nonspecific tenderness over the lumbar musculature.  Provocative testing of the shoulder is unremarkable.  Strength 5/5.  Sensation intact C5-T1.  Radial pulses 2+.  Hoffmann negative.    Radiographs: Bilateral shoulder films from 07/14/2021 personally reviewed demonstrate mild AC joint changes.  Normal alignment, no bony abnormalities    Assessment: Bilateral upper arm pain, diffuse body pain    Plan: I reviewed the findings with the patient and her daughter via telephone interpreter.  Her symptoms are relatively nonspecific and do not seem to correlate with any specific structural etiology in her shoulders or referred pain from her cervical spine.  It is more myofascial in nature and she does complain of similar complaints throughout her body.  She would likely benefit from muscle strengthening and conditioning.  She was encouraged to schedule physical therapy for which her PCP has already provided a referral.  She is prescribed diclofenac gel.  However I think she is better served follow-up with her primary care provider for evaluation for other pain conditions such as  fibromyalgia or systemic inflammatory problem.  She may follow-up with orthopedics as needed.    I agree with the remainder of the plan per the PA and personally spent the substantive portion of the encounter providing care for the patient.         This note was prepared using voice recognition software.  Please disregard any transcription errors.

## 2021-08-21 NOTE — Progress Notes (Signed)
ORTHOPEDIC SURGERY OFFICE NOTE     Diagnosis: Atraumatic Bilateral arm pain, Bilateral knee pain, Bilateral ankle pain x 4 years     HPI: This is a 66 yr old F who is here today for bilateral arm pain, R>L, and total body pain. She has not been worked up for this in the past. She is here with her daughter. She is very tearful and is explaining that her pain has been going on for 4+ years. No swelling. No redness/warmth, no significant fevers. She feels her pain is not controlled with Tylenol, lidocaine patches and gel. She cannot take NSAIDs due to her kidney disease. She states she has random pain in her joints and upper arms at random times; both day and night. She cannot sleep well. She cannot walk long distances. She denies CP, SOB or calf pain. No rashes or skin lesions. Has not seen Rheumatology or had any inflammatory labwork.   She helps with her grandkids at home, and has a lot of pain while trying to care for them.     Past Medical History: Depression, chronic kidney disease, hyperparathyroidism, panic disorder, anxiety, COVID,     Surgical History: Hemorrhoid, C-section     Family History: unknown    Social History: non smoker , retired     Allergies: pollen    Medications: see list     Review of Systems: 11 systems reviewed and noted in the new patient form scanned into the Epic record for this date of service.    Physical Exam:  Vital Signs: There were no vitals taken for this visit.  Gen: alert and oriented x3  HEENT: Head atraumatic, normocephalic. Conjunctivae clear. EOMI.  MSK: Right shoulder:  There is no erythema, edema, ecchymosis, or deformity of the shoulder. No effusion noted.   Patient is non tender to palpation at the [facility] joint, SC joints, bicipital groove, scapula or along the clavicle.   Patient has tenderness along the lateral aspect of the shoulder and biceps muscle belly  The patient has forward flexion to 170 degrees, abduction to 120 degrees.   Pt has about 40 degrees of internal and  external rotation without pain.    (-) Apprehension test. (-) Hawkin's test. (-) Neer's test. (-) Speed's test.   5/5 strength in all directions, equal to contralateral limb.    Neurovascularly intact distally.  No midline cervical pain to palpate.   Can flex and extend at the cervical spine without pain     Left shoulder:  There is no erythema, edema, ecchymosis, or deformity of the shoulder. No effusion noted.   Patient is non tender to palpation at the [facility] joint, SC joints, bicipital groove, scapula or along the clavicle.   Patient has tenderness along the biceps muscle belly   The patient has forward flexion to 170 degrees, abduction to 120 degrees.   Pt has about 40 degrees of internal and external rotation without pain.    (-) Apprehension test. (-) Hawkin's test. (-) Neer's test. (-) Speed's test.   5/5 strength in all directions, equal to contralateral limb.    Neurovascularly intact distally.  No midline cervical pain to palpate.   Can flex and extend at the cervical spine without pain     Radiology: X-rays of the right shoulder were reviewed: no fracture or dislocation.     Assessment and Plan: This is a 66 yr old [place] speaking female who is here for an initial Ortho evaluation regarding multiple joint pain and  upper arm pain for the past 4 years. She denies trauma. She has signs and symptoms of pain that may be related to an inflammatory arthropathy, or even fibromyalgia. She is very tearful in the office today and is asking for pain management, but due to her complicated PMH with kidney disease, we will defer pain management to her PCP. Recommend being worked up for inflammatory labs, and possible referral to Rheumatology. There are no structural concerns for Ortho Surgery team.   F/u with [name] as needed.     Patient was seen and examined with Dr. Haskell Riling, who agrees with the assessment and plan. Please see his attending note.    This note was prepared using voice recognition software. Please  disregard any transcription errors.    Amada Kingfisher PA-C , 08/21/2021

## 2021-08-28 ENCOUNTER — Other Ambulatory Visit (HOSPITAL_BASED_OUTPATIENT_CLINIC_OR_DEPARTMENT_OTHER): Payer: Self-pay

## 2021-08-29 ENCOUNTER — Telehealth (HOSPITAL_BASED_OUTPATIENT_CLINIC_OR_DEPARTMENT_OTHER): Payer: Self-pay | Admitting: Registered Nurse

## 2021-08-29 NOTE — Telephone Encounter (Signed)
Spoke with daughter, Sara Snyder using a telephone interpreter,   [phone], elliott  Relayed message and plan  Has been complaining about the pain for a while  Was told she 'Needs to see Rheumatologist'  Wonders if this can be a telephone apt, since she can't get off work to bring her this week and wants to get the referral started as soon as possible  RN to notify provider

## 2021-08-29 NOTE — Telephone Encounter (Signed)
Carmelia Bake, MD  P Ec Rn Pool  Hi RN pool, can you reach out to [employer] and offer her a f/u appt to further discuss pain management if desired? Thanks, Forde Radon

## 2021-09-03 ENCOUNTER — Ambulatory Visit
Payer: No Typology Code available for payment source | Attending: Student in an Organized Health Care Education/Training Program | Admitting: Student in an Organized Health Care Education/Training Program

## 2021-09-03 ENCOUNTER — Encounter (HOSPITAL_BASED_OUTPATIENT_CLINIC_OR_DEPARTMENT_OTHER): Payer: Self-pay | Admitting: Student in an Organized Health Care Education/Training Program

## 2021-09-03 ENCOUNTER — Ambulatory Visit (HOSPITAL_BASED_OUTPATIENT_CLINIC_OR_DEPARTMENT_OTHER): Payer: Self-pay | Admitting: Otolaryngology

## 2021-09-03 DIAGNOSIS — M2550 Pain in unspecified joint: Secondary | ICD-10-CM | POA: Insufficient documentation

## 2021-09-03 DIAGNOSIS — K219 Gastro-esophageal reflux disease without esophagitis: Secondary | ICD-10-CM | POA: Insufficient documentation

## 2021-09-03 NOTE — Progress Notes (Signed)
TELEVISIT  Conducted via: audio only w [place] interpreter  Confirmed patient's location and DOB prior to beginning call    SUBJECTIVE:    Sara Snyder a 66 year old female patient of Overton Mam, MD presents for fu of pain, rheum referral    HPI:    #Polyarthralgia  - still feeling "severe pain in my bones"  - pain all over the body, when she's walking, her legs and knees bother her  - when she feels the most pain when sitting, she notes more pain in her arms-- when asked specify shoulders/wrists/hands, simply says "yes, all"  - no new skin changes    #GERD  - also notes persistent stomach pain and burning, but is hesitant because someone told her that the pill is dangerous (omeprazole)   - notes that foods like tomatoes make it worse  Plan:  h pylori ordered (stop omeprazole prior to test)  rec trial daily omeprazole for 3-4 weeks to assess symptom control       REVIEW OF SYSTEMS: negative or noted above    Current Outpatient Medications   Medication Sig    diclofenac (VOLTAREN) 1 % GEL Gel Apply 4 g topically in the morning and 4 g at noon and 4 g in the evening and 4 g before bedtime.    venlafaxine (EFFEXOR-XR) 150 MG 24 hr capsule TAKE ONE CAPSULE DAILY BY MOUTH NOTE DOSE REDUCTION. (150 MG TOTAL DAILY)    clonazePAM (KLONOPIN) 1 MG tablet 1 tablet twice daily, PRN    spironolactone (ALDACTONE) 100 MG tablet Take 1 tablet by mouth in the morning.    levothyroxine (SYNTHROID) 50 MCG tablet Take 1 tablet by mouth every morning before breakfast    lidocaine (LIDODERM) 5 % patch Place 1 patch onto the skin in the morning.    chlorthalidone (HYGROTEN) 25 MG tablet TAKE 1 TABLET BY MOUTH IN THE MORNING.    montelukast (SINGULAIR) 10 MG tablet Take 1 tablet by mouth nightly    amlodipine-valsartan (EXFORGE) 5-320 MG per tablet TAKE 1 TABLET BY MOUTH DAILY    omeprazole (PRILOSEC) 20 MG capsule Take 1 capsule by mouth daily    cholecalciferol (VITAMIN D3) 2000 UNIT tablet Take 1 tablet by  mouth 2 (two) times daily    atenolol (TENORMIN) 100 MG tablet Take 1 tablet by mouth nightly    Multiple Vitamins-Minerals (HAIR SKIN AND NAILS FORMULA) TABS Take 1 capsule by mouth daily     No current facility-administered medications for this visit.       OBJECTIVE:  Gen: Well-appearing, speaking normally  Psych: Normal speech and thought process    LABS/IMAGING:    ASSESSMENT AND PLAN:  Problem List Items Addressed This Visit    None  Visit Diagnoses     Polyarthralgia        Relevant Orders    ANA SCREEN WITH REFLEX    RHEUMATOID FACTOR    CYCLIC CITRULLIN PEPTIDE IGG    RBC SEDIMENTATION RATE    C-REACTIVE PROTEIN    Gastroesophageal reflux disease, unspecified whether esophagitis present        Relevant Orders    HELICOBACTER PYLORI STOOL AG        Plan: inflammatory markers and rheum serum w/u today; rheum referral pending results    Please note: at future lab visit, please draw labs ordered today, as well as future orders for TSH and BMP (already ordered)      Return precautions discussed with patient. Patient verbalizes understanding  and is in agreement with plan.      Judithe Modest, MD  Family Medicine

## 2021-09-05 ENCOUNTER — Encounter (HOSPITAL_BASED_OUTPATIENT_CLINIC_OR_DEPARTMENT_OTHER): Payer: Self-pay | Admitting: Internal Medicine

## 2021-09-05 ENCOUNTER — Encounter (HOSPITAL_BASED_OUTPATIENT_CLINIC_OR_DEPARTMENT_OTHER): Payer: Self-pay | Admitting: "Psychiatric/Mental Health

## 2021-09-05 ENCOUNTER — Other Ambulatory Visit (HOSPITAL_BASED_OUTPATIENT_CLINIC_OR_DEPARTMENT_OTHER): Payer: Self-pay | Admitting: Internal Medicine

## 2021-09-05 DIAGNOSIS — R1013 Epigastric pain: Secondary | ICD-10-CM

## 2021-09-05 DIAGNOSIS — K219 Gastro-esophageal reflux disease without esophagitis: Secondary | ICD-10-CM

## 2021-09-05 DIAGNOSIS — I10 Essential (primary) hypertension: Secondary | ICD-10-CM

## 2021-09-05 DIAGNOSIS — R140 Abdominal distension (gaseous): Secondary | ICD-10-CM

## 2021-09-05 MED FILL — CHLORTHALID 25MG: 90 days supply | Qty: 90 | Fill #0

## 2021-09-05 NOTE — Telephone Encounter (Signed)
PER Pharmacy, Sara Snyder is a 66 year old female has requested a refill of  - exforge    Last Office Visit: 09/03/21 with Percell Belt A    Last Physical Exam: 06/13/20  FECAL OCCULT BLOOD AGE 61+ due on 12/03/2018  Other Med Adult:  Most Recent BP Reading(s)  07/14/21 : (!) 142/68        Cholesterol (mg/dL)   Date Value   01/65/5374 215     LOW DENSITY LIPOPROTEIN DIRECT (mg/dL)   Date Value   82/70/7867 153     HIGH DENSITY LIPOPROTEIN (mg/dL)   Date Value   54/49/2010 44     TRIGLYCERIDES (mg/dL)   Date Value   12/31/1973 148         THYROID SCREEN TSH REFLEX FT4 (uIU/mL)   Date Value   07/14/2021 7.570 (H)         TSH (THYROID STIM HORMONE) (uIU/mL)   Date Value   01/14/2021 7.090 (H)       HEMOGLOBIN A1C (%)   Date Value   07/14/2021 6.3 (H)       No results found for: POCA1C      No results found for: INR    SODIUM (mmol/L)   Date Value   07/14/2021 139       POTASSIUM (mmol/L)   Date Value   07/14/2021 3.8           CREATININE (mg/dL)   Date Value   88/32/5498 1.2     Documented patient preferred pharmacies:    [facility] [place], [place] - [address]  Phone: [phone] Fax: [phone]

## 2021-09-06 NOTE — Telephone Encounter (Signed)
PER Patient,  Sara Snyder is a 66 year old female who has requested a refill of Exforge and omeprazole.    This refill request failed protocol because not all requested medications are on protocol. Please review.    - last office visit: 09/03/21 with [place], A  - last physical exam: 06/13/20    FECAL OCCULT BLOOD AGE 28+ due on 12/03/2018      HTN Med:    Most Recent BP Reading(s)  07/14/21 : (!) 142/68  06/05/21 : 126/68  02/26/21 : 152/70    Other Med Adult:  Most Recent BP Reading(s)  07/14/21 : Marland Kitchen 142/68        Cholesterol (mg/dL)   Date Value   82/99/3716 215     LOW DENSITY LIPOPROTEIN DIRECT (mg/dL)   Date Value   96/78/9381 153     HIGH DENSITY LIPOPROTEIN (mg/dL)   Date Value   01/75/1025 44     TRIGLYCERIDES (mg/dL)   Date Value   85/27/7824 148         THYROID SCREEN TSH REFLEX FT4 (uIU/mL)   Date Value   07/14/2021 7.570 (H)         TSH (THYROID STIM HORMONE) (uIU/mL)   Date Value   01/14/2021 7.090 (H)       HEMOGLOBIN A1C (%)   Date Value   07/14/2021 6.3 (H)       No results found for: POCA1C      No results found for: INR    SODIUM (mmol/L)   Date Value   07/14/2021 139       POTASSIUM (mmol/L)   Date Value   07/14/2021 3.8           CREATININE (mg/dL)   Date Value   23/53/6144 1.2         Documented patient preferred pharmacies:    [facility] [place], [place] - [address]  Phone: [phone] Fax: [phone]

## 2021-09-06 NOTE — Telephone Encounter (Signed)
PER Patient,  Sara Snyder is a 66 year old female who has requested a refill of chlorthalidone.    This refill request failed protocol because    Last clinic BP under 140/90. If clinic/home BP is not under control, pharmacy to make [facility] appt for patient     eGFR > 55 in past year       - last office visit: 09/03/21 with [place], A  - last physical exam: 06/13/20    FECAL OCCULT BLOOD AGE 48+ due on 12/03/2018      HTN Med:    Most Recent BP Reading(s)  07/14/21 : (!) 142/68  06/05/21 : 126/68  02/26/21 : 152/70        Documented patient preferred pharmacies:    [facility] [place], [place].  Phone: [phone] Fax: [phone]

## 2021-09-08 MED ORDER — AMLODIPINE BESYLATE-VALSARTAN 5-320 MG PO TABS
1.0000 | ORAL_TABLET | Freq: Every day | ORAL | 0 refills | Status: DC
Start: 2021-09-08 — End: 2021-10-08

## 2021-09-08 MED ORDER — OMEPRAZOLE 20 MG PO CPDR
20.0000 mg | DELAYED_RELEASE_CAPSULE | Freq: Every day | ORAL | 0 refills | Status: DC
Start: 2021-09-08 — End: 2021-10-23

## 2021-09-08 MED ORDER — CHLORTHALIDONE 25 MG PO TABS
25.0000 mg | ORAL_TABLET | Freq: Every day | ORAL | 0 refills | Status: DC
Start: 2021-09-08 — End: 2021-12-09

## 2021-09-08 MED FILL — OMEPRAZOLE 20MG: 90 days supply | Qty: 90 | Fill #0

## 2021-09-08 MED FILL — AMLOD/VALSAR 5-320MG: 30 days supply | Qty: 30 | Fill #0

## 2021-09-08 NOTE — Telephone Encounter (Signed)
Dear RN, filled BP med X 1 mo, possible interaction w other med. Pls schedule apt with me next avail (telhone or in person) Re: review all BP meds.   Thanks!   Lavone Orn         Outreach  #1  Called pt on assigned phone number  Did not answer  LM on VM instructing to call back when available.  No PHI disclosed    Paulino Rily, RN, 09/08/2021

## 2021-09-09 NOTE — Telephone Encounter (Signed)
Spoke with daughter    Relayed message   Tele visit made for this Friday    Desiree Lucy, RN, 09/09/2021

## 2021-09-09 NOTE — Telephone Encounter (Signed)
Left detailed message stating mom has been assigned to Dr.Lupez. Instructed to call back to schedule an appointment to establish care.    Paulino Rily, RN, 09/09/2021

## 2021-09-10 MED FILL — VENLAFAXINE 150MG ER: 30 days supply | Qty: 30 | Fill #0

## 2021-09-12 ENCOUNTER — Encounter (HOSPITAL_BASED_OUTPATIENT_CLINIC_OR_DEPARTMENT_OTHER): Payer: Self-pay | Admitting: Internal Medicine

## 2021-09-12 ENCOUNTER — Ambulatory Visit: Payer: No Typology Code available for payment source | Attending: Internal Medicine | Admitting: Internal Medicine

## 2021-09-12 DIAGNOSIS — I10 Essential (primary) hypertension: Secondary | ICD-10-CM | POA: Diagnosis present

## 2021-09-12 NOTE — Progress Notes (Signed)
66 y/o F televisit:  Patient & family member on the call    #"BP meds":  Current BP meds rx'd in chart = chlorthalidone 25 mg daily, amlodipine / valsartan 5/320 daily, atenolol 100 mg daily, spironolactone 100 mg daily.   From chart hx stage 3a kidney disease  Last clinic visit 1/23 BP 142/68  Last BMP 1/23 K,Na, Cr normal range  Per family and pt not taking spironolactone   Per pt BP normal at home     ROS: No overt headache, no overt shortness of breath, no overt chest pain, no overt abdominal pain    Physical exam  Speaking full sentences with overt ease  Normal conversation  Otherwise unable to assess by phone    A&P  #HTN:  Normal at home per pt, family and pt not certain of alll meds  -Advised in person visit with PCP to review meds directly (to bring in) along with BP recheck (and re-eval of need for each med).  -Counseled regarding general self care  -Counseled patient re signs & symptoms to prompt swift re-evaluation    #F/U with PCP.

## 2021-09-19 ENCOUNTER — Ambulatory Visit: Payer: No Typology Code available for payment source | Attending: Ophthalmology

## 2021-09-19 ENCOUNTER — Other Ambulatory Visit: Payer: Self-pay

## 2021-09-19 DIAGNOSIS — R6889 Other general symptoms and signs: Secondary | ICD-10-CM | POA: Insufficient documentation

## 2021-09-19 DIAGNOSIS — H40003 Preglaucoma, unspecified, bilateral: Secondary | ICD-10-CM | POA: Diagnosis present

## 2021-09-19 NOTE — Progress Notes (Signed)
Procedure performed by technician under supervision of attending MD.

## 2021-09-24 ENCOUNTER — Ambulatory Visit: Payer: No Typology Code available for payment source | Admitting: "Psychiatric/Mental Health

## 2021-09-24 ENCOUNTER — Encounter (HOSPITAL_BASED_OUTPATIENT_CLINIC_OR_DEPARTMENT_OTHER): Payer: Self-pay

## 2021-09-24 ENCOUNTER — Other Ambulatory Visit (HOSPITAL_BASED_OUTPATIENT_CLINIC_OR_DEPARTMENT_OTHER): Payer: Self-pay | Admitting: "Psychiatric/Mental Health

## 2021-09-24 ENCOUNTER — Encounter (HOSPITAL_BASED_OUTPATIENT_CLINIC_OR_DEPARTMENT_OTHER): Payer: Self-pay | Admitting: "Psychiatric/Mental Health

## 2021-09-24 DIAGNOSIS — F410 Panic disorder [episodic paroxysmal anxiety] without agoraphobia: Secondary | ICD-10-CM | POA: Insufficient documentation

## 2021-09-24 DIAGNOSIS — F3341 Major depressive disorder, recurrent, in partial remission: Secondary | ICD-10-CM | POA: Insufficient documentation

## 2021-09-24 DIAGNOSIS — F411 Generalized anxiety disorder: Secondary | ICD-10-CM | POA: Insufficient documentation

## 2021-09-24 MED ORDER — VENLAFAXINE HCL ER 150 MG PO CP24
ORAL_CAPSULE | ORAL | 2 refills | Status: DC
Start: 2021-09-24 — End: 2021-10-08

## 2021-09-24 MED ORDER — CLONAZEPAM 1 MG PO TABS
ORAL_TABLET | ORAL | 1 refills | Status: DC
Start: 2021-09-24 — End: 2021-12-23

## 2021-09-24 NOTE — BH OP Treatment Plan (Signed)
Patient/Guardian:  contributed to the creation of the treatment plan.    Strengths/Skills: Independent and resourceful  Supportive family  Babysits grandchildren daily    Potential Barriers: Chronic illness    Patient stated goals: Stable MS and functioning    Problem 1: depression and anxiety    Short Term Goals: Cont'd psychopharm to manage symptoms    Short Term Target:  60 days  Short TermTarget Date: 11/23/2021  Short Term Goal #1 Progress: Progressing       Long Term Goals: Stable MS and functioning  Long Term Target:  90 days  Long TermTarget Date:  12/23/2021   Long Term Goal #1 Progress:  Progressing    Intervention #1: Psychopharm  Intervention Frequency:  monthly  as needed  Intervention Duration:  90 Days  Intervention Responsibility:  Mirna Mires APRN

## 2021-09-24 NOTE — Progress Notes (Signed)
ADULT OUTPATIENT PSYCHIATRY (OPD)  PSYCHOPHARM FOLLOW UP    BH TEAM- Geriatrics    LANGUAGE OF CARE:    Portuguese ([place])      LANGUAGE NEEDS MET:    Telephone Interpreter    CHIEF COMPLAINT:     SUBJECTIVE DATA/INTERVAL HISTORY:   Sara Snyder is assessed in a psychopharm f/u televisit with a [place] interpreter.  "I"m well, thanks be to god."  "I'm watching my grandchildren while their parents work."   She reports she cooks, does chores, cares for grandchildren-ages 7 and 8 y/o, and watches TV.   "My knees are still bothering me. I saw the doctor and they said I need to see a rheumatologist but I haven't heard from anybody yet."  Reviewed meds:   Effexor 150 mg AM; Klonopin 1 mg BID, PRN(every morning and night).  She is fully vaccinated; booster on 12/23 + 01/2021+ 06/2021.  She is not tangential; reports mood is "good"; no-depression, anxiety, sadness, irritability; worry, anhedonia, hopelessness, anger, guilt, racing thoughts, rumination, or SI. ; Energy, motivation, appetite, and sleep are normal. She is not hypomanic or overtly psychotic..   Med compliant.        OBJECTIVE DATA:   CURRENT MEDICATIONS:    Current Outpatient Medications   Medication Sig    traZODone (DESYREL) 50 MG tablet 1/2 - 1 tablet HS, PRN    fluticasone (FLONASE) 50 MCG/ACT nasal spray 1 spray by Each Nostril route daily    benzonatate (TESSALON) 100 MG capsule Take 1 capsule by mouth 3 (three) times daily as needed for Cough  for up to 20 days    venlafaxine (EFFEXOR-XR) 37.5 MG 24 hr capsule 1 tablet AM along with 75 mg-total = 112.5 mg    venlafaxine (EFFEXOR-XR) 75 MG 24 hr capsule Take 1 capsule by mouth daily Take along with 37.5 mg. TDD=112.5 mg.    valsartan (DIOVAN) 320 MG tablet Take 1 tablet by mouth daily    clonazePAM (KLONOPIN) 1 MG tablet 1 tablet twice daily as needed    chlorthalidone (HYGROTEN) 50 MG tablet Take 1 tablet by mouth daily    atenolol (TENORMIN) 50 MG tablet One tab in am and one tab in pm     omeprazole (PRILOSEC) 20 MG capsule Take 1 capsule by mouth daily    meclizine (ANTIVERT) 25 MG TABS Take 1 tablet by mouth every 8 (eight) hours as needed    Multiple Vitamins-Minerals (HAIR SKIN AND NAILS FORMULA) TABS Take 1 capsule by mouth daily    loratadine (CLARITIN) 10 MG tablet Take 1 tablet by mouth daily    calcium-vitamin D (OSCAL-500) 500-400 MG-UNIT per tablet Take 1 tablet by mouth 2 (two) times daily    azelastine (ASTELIN) 0.1 % nasal spray 1 spray by Each Nostril route 2 (two) times daily Use in each nostril as directed    acetaminophen (TYLENOL) 500 MG tablet Take 1 tablet by mouth every 6 (six) hours as needed for Pain     No current facility-administered medications for this visit.           VITAL SIGNS:   There were no vitals filed for this visit.     LABS:    WHITE BLOOD CELL COUNT (TH/uL)   Date Value   04/10/2019 5.8     RED BLOOD CELL COUNT (M/uL)   Date Value   04/10/2019 4.14     HEMOGLOBIN (g/dL)   Date Value   44/06/270 12.2     No results  found for: IDEALHCT  HEMATOCRIT (%)   Date Value   04/10/2019 37.5     No results found for: HCTDIFF  MEAN CORPUSCULAR VOL (fl)   Date Value   04/10/2019 90.6     MEAN CORPUSCULAR HGB (pg)   Date Value   04/10/2019 29.5     MEAN CORP HGB CONC (g/dL)   Date Value   54/62/7035 32.5     No results found for: RDW  PLATELET COUNT (TH/uL)   Date Value   04/10/2019 259     MEAN PLATELET VOLUME (fL)   Date Value   04/10/2019 10.7     NEUTROPHIL % (%)   Date Value   04/10/2019 56.9     LYMPHOCYTE % (%)   Date Value   04/10/2019 34.9     MONOCYTE % (%)   Date Value   04/10/2019 5.3     EOSINOPHIL % (%)   Date Value   04/10/2019 2.1     BASOPHIL % (%)   Date Value   04/10/2019 0.5           ALBUMIN (g/dL)   Date Value   00/93/8182 3.8     ALKALINE PHOSPHATASE (U/L)   Date Value   04/10/2019 117     ALANINE AMINOTRANSFERASE (U/L)   Date Value   04/10/2019 30     ASPARTATE AMINOTRANSFERASE (U/L)   Date Value   04/10/2019 21     Glucose Random (mg/dL)    Date Value   99/37/1696 99     BUN (UREA NITROGEN) (mg/dL)   Date Value   78/93/8101 17     CALCIUM (mg/dL)   Date Value   75/03/2584 9.0     CHLORIDE (mmol/L)   Date Value   07/20/2019 104     CARBON DIOXIDE (mmol/L)   Date Value   07/20/2019 30     CREATININE (mg/dL)   Date Value   27/78/2423 1.3 (H)     POTASSIUM (mmol/L)   Date Value   07/20/2019 3.9     SODIUM (mmol/L)   Date Value   07/20/2019 140     BILIRUBIN TOTAL (mg/dL)   Date Value   53/61/4431 0.3     TOTAL PROTEIN (g/dL)   Date Value   54/00/8676 7.4         THYROID SCREEN TSH REFLEX FT4   Date Value Ref Range Status   07/20/2019 4.710 (H) 0.358 - 3.740 uIU/mL Final     Comment:     **As per the Thyroid Screen protocol, an FT4 test is  automatically ordered for all patients with abnormal TSH  results unless the FT4 has already been performed on this  patient within the last 168 hours.        Cholesterol (mg/dL)   Date Value   19/50/9326 223     LOW DENSITY LIPOPROTEIN DIRECT (mg/dL)   Date Value   71/24/5809 159     HIGH DENSITY LIPOPROTEIN (mg/dL)   Date Value   98/33/8250 43     TRIGLYCERIDES (mg/dL)   Date Value   53/97/6734 169 (H)       HEMOGLOBIN A1C (%)   Date Value   07/20/2019 5.9 (H)     No results found for: POCA1C    RPR QUAL   Date Value Ref Range Status   07/20/2019 Non Reactive Non Reactive Final     Comment:     Performed at:  [facility]  [address] [place], [place]  [id number]  Lab Director: Roda Shutters MD, Phone:  [phone]       No results found for: HIVAB  No results found for: VAL  No results found for: CLOZ     Pregnancy/Birth Control (for female patients): N/A    CURRENT TREATMENT/CONTACT INFO FOR OTHER AGENCIES AND MENTAL HEALTH PROVIDERS (if applicable):    Contact/Community Support    No documentation.         COLUMBIARISK ASSESSMENTS:   Suicide:  C-SSRS OP Last Contact Screener - 08/16/19 1542        C-SSRS OP Last Contact Screener    Have you wished you were dead or wished you could go to sleep and not  wake up?  No     Have you actually had any thoughts of killing yourself?   No     Have you done anything, started to do anything, or prepared to do anything to end your life?  No     C-SSRS OP Last Contact Screener Risk Score  No Risk         C-SSRS Risk Assessment    No documentation.         VIOLENCE:   Violence/Abuse Risk    No documentation.          MENTAL STATUS EXAMINATION:  Pleasant; speech-nl rate/tone; attent/conc-nl; mood-"good, fine"; depression-no; anxiety-no; panic-no; irritability/impatience-no; sadness-no; anger-no; anhedonia-no; sleep-to bed at 11PM-74M-initial insomnia-no, wakings-for BR, EMA-no, difficulty w/ OOB and facing the day-up at 8:30-9 AM; appetite-nl; energy-nl; motivation-nl; isol/withdr-no; fatigue-naps; hopelessness-no; guilt-no; no-SI/HI. Coherent and no tangentiality; thought process/associations-fairly organized; thought content-no overt psychosis; fund of knowledge-grossly intact; memory-grossly intact; racing thoughts-no; rumination-no; worry-no; no-AH,VH,CH,OH,TH;paranoia-no;delusions-no; IOR-no; thought broadcasting/insertion/removal-no; A+Ox3. I/J-nl      RISK ASSESSMENTS:     Violence: low (1)     Addiction: low (1)     BIO/PSYCHO/SOCIAL AND RISK FORMULATION(S): 66 y/o [place] female with longstanding MDD, recurrent, GAD, and Panic D/O now in need of psychopharm follow up,and counseling though she has dropped out of same in the past.    ASSESSMENT:   Stable MS and functioning    DIAGNOSES:    MDD, recurrent, in partial remission; GAD; Panic D/O    PLAN: Continue meds as ordered. Next appt-6/28  Effexor XR 150 mg AM   Klonopin 1 mg twice daily, PRN - takes twice daily   .    Rozerem 8 mg HS - Rx'd by PCP  Trazodone 100 mg HS - D/C'd by PCP     INFORMED CONSENT - for any new medication:   N/A     CARE PLAN/ EPISODES:  Linked Episodes   Type: Episode: Status: Noted: Resolved: Last update: Updated by:   Behavioral Health - Outpatient GERIATRICS Active 08/16/2019  08/16/2019   3:39 PM Mirna Mires, RNCS      Comments:       COUNSELING AND COORDINATION OF CARE PROVIDED:   I spent a total of 30 minutes on this visit on the date of service (total time includes all activities performed on the date of service)      PSYCHOTHERAPY PROVIDED:none    Mirna Mires, RNCS

## 2021-09-25 ENCOUNTER — Telehealth (HOSPITAL_BASED_OUTPATIENT_CLINIC_OR_DEPARTMENT_OTHER): Payer: Self-pay

## 2021-09-25 MED FILL — *CLONAZEPAM 1MG: 30 days supply | Qty: 60 | Fill #0

## 2021-09-25 NOTE — Telephone Encounter (Signed)
Hi can you please call [name] about the following:    Received message from the psychiatry team that Sara Snyder was stating she was supposed to have a rheumatology appointment but has not heard from them. Reviewed her chart and saw that during a televisit with Dr. Percell Belt on 3/15, she endorsed arthralgias and Dr. Percell Belt ordered blood work to evaluate for a rheumatologic cause of the arthralgias. Dr. Clare Gandy note states that the plan was for a referral to rheum pending the results of the blood tests which have not been done yet.     Can you help her schedule a lab appointment to get the blood work done and we can go from there? Thanks!

## 2021-09-25 NOTE — Telephone Encounter (Signed)
Interpreter:Christiane  ID #: [id number]    Called pt, spoke with daughter    Pt spoke with ortho and was told that her case would need to be followed up by Rheumatology.  Pt is having pain in multiple locations and getting difficulty walking.     Explained that we need some blood work done first. Orthoptist. appt made for 4/10.    Paulino Rily, RN, 09/25/2021

## 2021-09-29 ENCOUNTER — Other Ambulatory Visit: Payer: Self-pay

## 2021-09-29 ENCOUNTER — Ambulatory Visit
Payer: No Typology Code available for payment source | Attending: Student in an Organized Health Care Education/Training Program

## 2021-09-29 ENCOUNTER — Other Ambulatory Visit (HOSPITAL_BASED_OUTPATIENT_CLINIC_OR_DEPARTMENT_OTHER): Payer: Self-pay | Admitting: Student in an Organized Health Care Education/Training Program

## 2021-09-29 DIAGNOSIS — M2550 Pain in unspecified joint: Secondary | ICD-10-CM | POA: Diagnosis present

## 2021-09-29 DIAGNOSIS — K219 Gastro-esophageal reflux disease without esophagitis: Secondary | ICD-10-CM | POA: Diagnosis present

## 2021-09-29 DIAGNOSIS — Z79899 Other long term (current) drug therapy: Secondary | ICD-10-CM | POA: Diagnosis present

## 2021-09-29 DIAGNOSIS — E038 Other specified hypothyroidism: Secondary | ICD-10-CM

## 2021-09-29 LAB — COMPREHENSIVE METABOLIC PANEL
ALANINE AMINOTRANSFERASE: 18 U/L (ref 12–45)
ALBUMIN: 4.2 g/dL (ref 3.4–5.2)
ALKALINE PHOSPHATASE: 108 U/L (ref 45–117)
ANION GAP: 11 mmol/L (ref 10–22)
ASPARTATE AMINOTRANSFERASE: 17 U/L (ref 8–34)
BILIRUBIN TOTAL: 0.2 mg/dL (ref 0.2–1.0)
BUN (UREA NITROGEN): 21 mg/dL — ABNORMAL HIGH (ref 7–18)
CALCIUM: 9.8 mg/dL (ref 8.5–10.5)
CARBON DIOXIDE: 30 mmol/L (ref 21–32)
CHLORIDE: 100 mmol/L (ref 98–107)
CREATININE: 1.3 mg/dL — ABNORMAL HIGH (ref 0.4–1.2)
ESTIMATED GLOMERULAR FILT RATE: 45 mL/min — ABNORMAL LOW (ref >60)
Glucose Random: 100 mg/dL (ref 74–160)
POTASSIUM: 3.8 mmol/L (ref 3.5–5.1)
SODIUM: 141 mmol/L (ref 136–145)
TOTAL PROTEIN: 7.3 g/dL (ref 6.4–8.2)

## 2021-09-29 LAB — C-REACTIVE PROTEIN: C-REACTIVE PROTEIN: 26 mg/L — ABNORMAL HIGH (ref 0–18)

## 2021-09-29 LAB — RHEUMATOID FACTOR: RHEUMATOID FACTOR: <10 IU/mL (ref 0–13)

## 2021-09-29 LAB — THYROID SCREEN TSH REFLEX FT4: THYROID SCREEN TSH REFLEX FT4: 2.73 u[IU]/mL (ref 0.270–4.200)

## 2021-09-29 LAB — RBC SEDIMENTATION RATE: RBC SEDIMENTATION RATE: 21 MM/HR (ref 0–30)

## 2021-09-29 NOTE — Progress Notes (Signed)
Labs drawn

## 2021-09-30 ENCOUNTER — Telehealth (HOSPITAL_BASED_OUTPATIENT_CLINIC_OR_DEPARTMENT_OTHER): Payer: Self-pay | Admitting: Registered Nurse

## 2021-09-30 LAB — ANA SCREEN WITH REFLEX: ANTINUCLEAR ANTIBODY SCREEN: NEGATIVE

## 2021-09-30 MED FILL — LEVOTHYROXIN 50MCG: 90 days supply | Qty: 90 | Fill #0

## 2021-09-30 NOTE — Telephone Encounter (Signed)
-----   Message from Lilia Argue, MD sent at 09/30/2021 10:23 AM EDT -----  Results are WNL. Please call patient and relay results. No further action needed.

## 2021-09-30 NOTE — Telephone Encounter (Signed)
PER Pharmacy, Sara Snyder is a 66 year old female has requested a refill of levothyroxine.    This refill request failed protocol because    Thyroid Hormones Protocol Failed 09/29/2021 09:48 AM   Protocol Details  Normal TSH in past 12 months              Last Office Visit: 09/12/2021 with Elmer Bales  Last Physical Exam: 06/13/2020    FECAL OCCULT BLOOD AGE 47+ due on 12/03/2018    Thyroid Med:  TSH:  TSH (THYROID STIM HORMONE) (uIU/mL)   Date Value   01/14/2021 7.090 (H)    TSH Screen:  THYROID SCREEN TSH REFLEX FT4 (uIU/mL)   Date Value   09/29/2021 2.730       Documented patient preferred pharmacies:    [facility] [place], [place] - [address]  Phone: [phone] Fax: [phone]

## 2021-09-30 NOTE — Telephone Encounter (Signed)
Tel call to pt's daughter Lisabeth Register w/ [place] interpreter  Notified pt's CMP is nml, advised to f/u with Dr Percell Belt for rest of results   Reports PCP recommended pt stop sprionolactone d/t interference with other BP meds  Advised to f/u at scheduled appt on 5/9 with derm   Verbalized understanding, no add'l questions at this time

## 2021-10-01 ENCOUNTER — Encounter (HOSPITAL_BASED_OUTPATIENT_CLINIC_OR_DEPARTMENT_OTHER): Payer: Self-pay | Admitting: Student in an Organized Health Care Education/Training Program

## 2021-10-01 ENCOUNTER — Encounter (HOSPITAL_BASED_OUTPATIENT_CLINIC_OR_DEPARTMENT_OTHER): Payer: Self-pay | Admitting: Otolaryngology

## 2021-10-02 LAB — CCP ANTIBODIES IGG/IGA: CCP ANTIBODIES IGG/IGA: 35 units — AB (ref 0–19)

## 2021-10-02 MED ORDER — MONTELUKAST SODIUM 10 MG PO TABS
10.0000 mg | ORAL_TABLET | Freq: Every evening | ORAL | 6 refills | Status: DC
Start: 2021-10-02 — End: 2021-11-06

## 2021-10-02 MED FILL — MONTELUKAST 10MG: 90 days supply | Qty: 90 | Fill #0

## 2021-10-03 ENCOUNTER — Telehealth (HOSPITAL_BASED_OUTPATIENT_CLINIC_OR_DEPARTMENT_OTHER): Payer: Self-pay

## 2021-10-03 DIAGNOSIS — IMO0002 Reserved for concepts with insufficient information to code with codable children: Secondary | ICD-10-CM

## 2021-10-03 DIAGNOSIS — R7989 Other specified abnormal findings of blood chemistry: Secondary | ICD-10-CM

## 2021-10-03 DIAGNOSIS — M2550 Pain in unspecified joint: Secondary | ICD-10-CM

## 2021-10-03 NOTE — Telephone Encounter (Signed)
Hello, can you please let Sara Snyder know that some of the blood work for the joint pains was abnormal so I am sending her to the rheumatologist for further evaluation.     Please let her know that her kidney function was a little abnormal. Please encourage her to hydrate and we will repeat this labs in a couple weeks to see if they are improving (I have ordered the labs if you can help her schedule a lab appointment). Can you screen for NSAID use?     Otherwise normal labs (TSH, rest of CMP).     Thanks!

## 2021-10-07 ENCOUNTER — Encounter (HOSPITAL_BASED_OUTPATIENT_CLINIC_OR_DEPARTMENT_OTHER): Payer: Self-pay | Admitting: Registered Nurse

## 2021-10-07 NOTE — Telephone Encounter (Signed)
Spoke with daughter [name] using a telephone interpreter, [phone] nuno  Relayed results   Discussed referral and sent number to her via mychart  Discussed drinking goals and changing up water with other non caffeine drinks   Uses Advil some, not sure how much, but will avoid until she sees PCP  Verbalized back and agrees with plan

## 2021-10-08 ENCOUNTER — Encounter (HOSPITAL_BASED_OUTPATIENT_CLINIC_OR_DEPARTMENT_OTHER): Payer: Self-pay | Admitting: Internal Medicine

## 2021-10-08 ENCOUNTER — Encounter (HOSPITAL_BASED_OUTPATIENT_CLINIC_OR_DEPARTMENT_OTHER): Payer: Self-pay | Admitting: "Psychiatric/Mental Health

## 2021-10-08 DIAGNOSIS — I10 Essential (primary) hypertension: Secondary | ICD-10-CM

## 2021-10-08 DIAGNOSIS — F330 Major depressive disorder, recurrent, mild: Secondary | ICD-10-CM

## 2021-10-09 ENCOUNTER — Encounter (HOSPITAL_BASED_OUTPATIENT_CLINIC_OR_DEPARTMENT_OTHER): Payer: Self-pay | Admitting: Internal Medicine

## 2021-10-09 ENCOUNTER — Encounter (HOSPITAL_BASED_OUTPATIENT_CLINIC_OR_DEPARTMENT_OTHER): Payer: Self-pay | Admitting: Otolaryngology

## 2021-10-09 ENCOUNTER — Encounter (HOSPITAL_BASED_OUTPATIENT_CLINIC_OR_DEPARTMENT_OTHER): Payer: Self-pay | Admitting: "Psychiatric/Mental Health

## 2021-10-09 ENCOUNTER — Encounter (HOSPITAL_BASED_OUTPATIENT_CLINIC_OR_DEPARTMENT_OTHER): Payer: Self-pay

## 2021-10-09 ENCOUNTER — Other Ambulatory Visit (HOSPITAL_BASED_OUTPATIENT_CLINIC_OR_DEPARTMENT_OTHER): Payer: Self-pay | Admitting: Internal Medicine

## 2021-10-09 DIAGNOSIS — I10 Essential (primary) hypertension: Secondary | ICD-10-CM

## 2021-10-09 MED ORDER — AMLODIPINE BESYLATE-VALSARTAN 5-320 MG PO TABS
1.0000 | ORAL_TABLET | Freq: Every day | ORAL | 11 refills | Status: DC
Start: 2021-10-09 — End: 2021-12-25

## 2021-10-09 MED ORDER — VENLAFAXINE HCL ER 150 MG PO CP24
ORAL_CAPSULE | ORAL | 3 refills | Status: DC
Start: 2021-10-09 — End: 2022-01-13

## 2021-10-09 MED FILL — AMLOD/VALSAR 5-320MG: 90 days supply | Qty: 90 | Fill #0

## 2021-10-09 MED FILL — VENLAFAXINE 150MG ER: 30 days supply | Qty: 30 | Fill #0

## 2021-10-09 NOTE — Telephone Encounter (Signed)
PER Pharmacy, Sara Snyder is a 66 year old female has requested a refill of      -  Exforge       Last Office Visit: 09/12/2021 with Gasper Sells  Last Physical Exam: 06/13/2020     FECAL OCCULT BLOOD AGE 67+ due on 12/03/2018     Other Med Adult:  Most Recent BP Reading(s)  07/14/21 : (!) 142/68        Cholesterol (mg/dL)   Date Value   53/97/6734 215     LOW DENSITY LIPOPROTEIN DIRECT (mg/dL)   Date Value   19/37/9024 153     HIGH DENSITY LIPOPROTEIN (mg/dL)   Date Value   09/73/5329 44     TRIGLYCERIDES (mg/dL)   Date Value   92/42/6834 148         THYROID SCREEN TSH REFLEX FT4 (uIU/mL)   Date Value   09/29/2021 2.730         TSH (THYROID STIM HORMONE) (uIU/mL)   Date Value   01/14/2021 7.090 (H)       HEMOGLOBIN A1C (%)   Date Value   07/14/2021 6.3 (H)       No results found for: POCA1C      No results found for: INR    SODIUM (mmol/L)   Date Value   09/29/2021 141       POTASSIUM (mmol/L)   Date Value   09/29/2021 3.8           CREATININE (mg/dL)   Date Value   19/62/2297 1.3 (H)        Documented patient preferred pharmacies:    [facility] [place], [place] - [address]  Phone: [phone] Fax: [phone]

## 2021-10-22 ENCOUNTER — Other Ambulatory Visit: Payer: Self-pay

## 2021-10-22 ENCOUNTER — Ambulatory Visit: Payer: No Typology Code available for payment source

## 2021-10-22 DIAGNOSIS — R7989 Other specified abnormal findings of blood chemistry: Secondary | ICD-10-CM | POA: Diagnosis not present

## 2021-10-22 DIAGNOSIS — IMO0002 Reserved for concepts with insufficient information to code with codable children: Secondary | ICD-10-CM

## 2021-10-22 LAB — BASIC METABOLIC PANEL
ANION GAP: 10 mmol/L (ref 10–22)
BUN (UREA NITROGEN): 18 mg/dL (ref 7–18)
CALCIUM: 9.4 mg/dL (ref 8.5–10.5)
CARBON DIOXIDE: 30 mmol/L (ref 21–32)
CHLORIDE: 99 mmol/L (ref 98–107)
CREATININE: 1.2 mg/dL (ref 0.4–1.2)
ESTIMATED GLOMERULAR FILT RATE: 49 mL/min — ABNORMAL LOW (ref >60)
Glucose Random: 103 mg/dL (ref 74–160)
POTASSIUM: 4 mmol/L (ref 3.5–5.1)
SODIUM: 139 mmol/L (ref 136–145)

## 2021-10-22 LAB — HELICOBACTER PYLORI STOOL AG

## 2021-10-22 NOTE — Progress Notes (Signed)
Pt dropped off stool Specimen.  Released H.Pylori open order.  Prepared specimen for courier.  [place], [place], 10/22/2021  Released open orders.  Routine blood draw, 1 small sst tube.  Prepared specimens for courier.  [place], [place], 10/22/2021

## 2021-10-23 ENCOUNTER — Ambulatory Visit (HOSPITAL_BASED_OUTPATIENT_CLINIC_OR_DEPARTMENT_OTHER): Payer: No Typology Code available for payment source | Admitting: Otolaryngology

## 2021-10-23 ENCOUNTER — Telehealth (HOSPITAL_BASED_OUTPATIENT_CLINIC_OR_DEPARTMENT_OTHER): Payer: Self-pay

## 2021-10-23 DIAGNOSIS — R140 Abdominal distension (gaseous): Secondary | ICD-10-CM

## 2021-10-23 DIAGNOSIS — R1013 Epigastric pain: Secondary | ICD-10-CM

## 2021-10-23 DIAGNOSIS — K219 Gastro-esophageal reflux disease without esophagitis: Secondary | ICD-10-CM

## 2021-10-23 MED ORDER — OMEPRAZOLE 20 MG PO CPDR
20.0000 mg | DELAYED_RELEASE_CAPSULE | Freq: Every day | ORAL | 0 refills | Status: DC
Start: 2021-10-23 — End: 2022-02-25

## 2021-10-23 NOTE — Telephone Encounter (Signed)
Interpreter:Kelly  ID #: [id number]    Spoke with daughter  Relayed results and plan of care  Reminded of f/u appt    Daughter verbalized understanding    Desiree Lucy, RN, 10/23/2021

## 2021-10-23 NOTE — Telephone Encounter (Signed)
Hello,     Can you please call Sara Snyder/daughter to let them know the kidney function was better on this last set of labs and h pylori test was negative. She can re-start the omeprazole for her GERD now that the h pylori test is complete and we can discuss her symptoms further at our upcoming visit.     Please let me know if they have questions. Thanks!

## 2021-10-28 ENCOUNTER — Other Ambulatory Visit: Payer: Self-pay

## 2021-10-28 ENCOUNTER — Ambulatory Visit (HOSPITAL_BASED_OUTPATIENT_CLINIC_OR_DEPARTMENT_OTHER)
Payer: No Typology Code available for payment source | Admitting: Student in an Organized Health Care Education/Training Program

## 2021-11-01 ENCOUNTER — Other Ambulatory Visit: Payer: Self-pay

## 2021-11-01 ENCOUNTER — Ambulatory Visit: Payer: No Typology Code available for payment source | Attending: Otolaryngology | Admitting: Otolaryngology

## 2021-11-01 DIAGNOSIS — R040 Epistaxis: Secondary | ICD-10-CM

## 2021-11-01 DIAGNOSIS — R0981 Nasal congestion: Secondary | ICD-10-CM

## 2021-11-01 NOTE — Progress Notes (Signed)
HPI: 66 year old female Sara Snyder complains of nosebleeds and left nasal congestion. She is here with her daughters today who are provided history. She continues to take Singulair and she did not use mupirocin ointment because it felt "sandy" and she did not like using it. She continues to pick crusts out of her nose and clean it.     She c/o allergic rhinitis, sneezing and rhinorrhea. Symptoms started when she moved to [place] from [place].     Past Medical History:  09/11/2018: Chronic kidney disease, stage III (moderate) (HCC)  No date: HTN (hypertension)  01/05/2018: Mild episode of recurrent major depressive disorder ([place])  02/20/2021: Osteopenia  11/29/2017: Panic disorder  No date: Wears eyeglasses    Review of Patient's Allergies indicates:   Pollen extract          Cough    omeprazole (PRILOSEC) 20 MG capsule, Take 1 capsule by mouth in the morning., Disp: 90 capsule, Rfl: 0  venlafaxine (EFFEXOR-XR) 150 MG 24 hr capsule, 1 capsule daily, Disp: 30 capsule, Rfl: 3  amlodipine-valsartan (EXFORGE) 5-320 MG per tablet, Take 1 tablet by mouth in the morning., Disp: 30 tablet, Rfl: 11  montelukast (SINGULAIR) 10 MG tablet, Take 1 tablet by mouth nightly, Disp: 30 tablet, Rfl: 6  levothyroxine (SYNTHROID) 50 MCG tablet, Take 1 tablet by mouth every morning before breakfast, Disp: 90 tablet, Rfl: 3  clonazePAM (KLONOPIN) 1 MG tablet, 1 tablet twice daily, PRN, Disp: 60 tablet, Rfl: 1  chlorthalidone (HYGROTEN) 25 MG tablet, Take 1 tablet by mouth in the morning., Disp: 90 tablet, Rfl: 0  atenolol (TENORMIN) 100 MG tablet, TAKE 1 TABLET BY MOUTH NIGHTLY, Disp: 30 tablet, Rfl: 0  spironolactone (ALDACTONE) 100 MG tablet, Take 1 tablet by mouth in the morning., Disp: 30 tablet, Rfl: 2  lidocaine (LIDODERM) 5 % patch, Place 1 patch onto the skin in the morning., Disp: 30 patch, Rfl: 2  cholecalciferol (VITAMIN D3) 2000 UNIT tablet, Take 1 tablet by mouth 2 (two) times daily, Disp: 180 tablet, Rfl: 3  Multiple  Vitamins-Minerals (HAIR SKIN AND NAILS FORMULA) TABS, Take 1 capsule by mouth daily, Disp: 30 tablet, Rfl: 12    No current facility-administered medications on file prior to visit.      Social and family history reviewed either in EPIC or with patient and are not contributory unless specifically noted in HPI.    Review of Systems  Constitutional normal.  Eyes/Blurry vision No.  CV/Palpitations No.  Resp/SOB No.  Endocrine/Feeling tired No.      Physical Exam  There were no vitals filed for this visit.    General: WD WN female with a normal voice.  Face: No lesions.  Facial Strength: Normal and symmetric.  Eyes: PERRLA and EOMI.  Nose:  Septum midline and left side is excoriated w/ small crust.  Turbinates normal size.  No polyps or masses.  Nasopharynx:  CNV    Oral Cavity/Oropharynx:  Mucous membranes moist.  Tonsils 0+.  Teeth in good condition.  Tongue no lesions.    Procedure: Nasal Endoscopy  Indications: Hx of recurrent epistaxis, need to evaluate nasal anatomy not easily seen on anterior rhinoscopy and need to use a Hopkins rigid endoscope.     Risks of Nasal endoscopy discussed with patient including: nose bleed, pain, infection. Risk of nasal decongestion includes anaphylactic reaction to medication, foul taste.    Afrin and Lidocaine B/L  FOE/0 30 Hopkins Rod into Right and Left Nasal Passage  Right:  Nasal Mucosa normal, Septum Midline, IT and MT normal, OMC normal, olfactory cleft normal, no polyps or lesions seen, NP Torus normal, Fossa or Rosenmueller Clear, Adenoid bed normal  Left: Nasal Mucosa normal, Septum Midline w/ crusting anteriorly, IT and MT normal, OMC normal, olfactory cleft normal, no polyps or lesions seen, NP Torus normal, Fossa or Rosenmueller Clear, Adenoid bed normal      Data reviewed for today's visit:   Daughter's independent history.     A/P  66 year old female was evaluated and we recommend the following:    (R04.0) Recurrent epistaxis  (primary encounter diagnosis)  Comment:  left side  Plan:   -Stop picking crusts.   -Start nasogel 4x daily   -No nose blowing   -Consider cautery at next visit     (R09.81) Nasal congestion  Comment: due to chronic AR and crust on left.  Plan:   Continue Singulair, can stop after next visit if she is improved or if cautery can be done

## 2021-11-01 NOTE — Progress Notes (Signed)
I interviewed and examined the patient personally. I agree with the assessment and plan of Claybon Jabs, PA-C.    HPI and Exam-  Pertinent positives include symptoms as noted as well as following physical exam findings-  Exam: no cautery needed.    Plan-  Agree with Jaime's assessment that patient needs the following:  -No nose blowing, picking, heavy lifting or bending. Sneeze with mouth open. Cold mist humidifier at night and will help with exacerbation of nosebleeds. If their nosebleeds do not improve, they may require a visit to the emergency room, further procedures such as nasal cautery or may be severe enough to need to go to the operating room. In addition to humidity, we advised to minimize hot showers and hot beverages as these may also precipitate nosebleeds.      Lynn Ito, MD, MBA  Otolaryngology- Head and Neck Surgery

## 2021-11-06 ENCOUNTER — Ambulatory Visit: Payer: No Typology Code available for payment source

## 2021-11-06 ENCOUNTER — Other Ambulatory Visit: Payer: Self-pay

## 2021-11-06 ENCOUNTER — Encounter (HOSPITAL_BASED_OUTPATIENT_CLINIC_OR_DEPARTMENT_OTHER): Payer: Self-pay

## 2021-11-06 VITALS — BP 153/72 | HR 79 | Temp 97.0°F | Ht 58.66 in | Wt 200.0 lb

## 2021-11-06 DIAGNOSIS — Z1211 Encounter for screening for malignant neoplasm of colon: Secondary | ICD-10-CM | POA: Insufficient documentation

## 2021-11-06 DIAGNOSIS — E66812 Obesity, class 2: Secondary | ICD-10-CM

## 2021-11-06 DIAGNOSIS — N1831 Chronic kidney disease, stage 3a: Secondary | ICD-10-CM | POA: Insufficient documentation

## 2021-11-06 DIAGNOSIS — Z6841 Body Mass Index (BMI) 40.0 and over, adult: Secondary | ICD-10-CM | POA: Diagnosis present

## 2021-11-06 DIAGNOSIS — I10 Essential (primary) hypertension: Secondary | ICD-10-CM | POA: Insufficient documentation

## 2021-11-06 DIAGNOSIS — E785 Hyperlipidemia, unspecified: Secondary | ICD-10-CM | POA: Insufficient documentation

## 2021-11-06 DIAGNOSIS — G5601 Carpal tunnel syndrome, right upper limb: Secondary | ICD-10-CM | POA: Diagnosis present

## 2021-11-06 DIAGNOSIS — L659 Nonscarring hair loss, unspecified: Secondary | ICD-10-CM | POA: Diagnosis present

## 2021-11-06 DIAGNOSIS — F330 Major depressive disorder, recurrent, mild: Secondary | ICD-10-CM | POA: Diagnosis present

## 2021-11-06 DIAGNOSIS — E6609 Other obesity due to excess calories: Secondary | ICD-10-CM | POA: Diagnosis present

## 2021-11-06 DIAGNOSIS — E559 Vitamin D deficiency, unspecified: Secondary | ICD-10-CM | POA: Insufficient documentation

## 2021-11-06 DIAGNOSIS — F410 Panic disorder [episodic paroxysmal anxiety] without agoraphobia: Secondary | ICD-10-CM | POA: Diagnosis present

## 2021-11-06 DIAGNOSIS — J309 Allergic rhinitis, unspecified: Secondary | ICD-10-CM | POA: Insufficient documentation

## 2021-11-06 DIAGNOSIS — E038 Other specified hypothyroidism: Secondary | ICD-10-CM | POA: Diagnosis present

## 2021-11-06 DIAGNOSIS — N2581 Secondary hyperparathyroidism of renal origin: Secondary | ICD-10-CM | POA: Insufficient documentation

## 2021-11-06 DIAGNOSIS — M2550 Pain in unspecified joint: Secondary | ICD-10-CM | POA: Insufficient documentation

## 2021-11-06 DIAGNOSIS — R040 Epistaxis: Secondary | ICD-10-CM | POA: Insufficient documentation

## 2021-11-06 DIAGNOSIS — M8580 Other specified disorders of bone density and structure, unspecified site: Secondary | ICD-10-CM | POA: Insufficient documentation

## 2021-11-06 DIAGNOSIS — F5104 Psychophysiologic insomnia: Secondary | ICD-10-CM | POA: Diagnosis present

## 2021-11-06 DIAGNOSIS — I701 Atherosclerosis of renal artery: Secondary | ICD-10-CM | POA: Diagnosis present

## 2021-11-06 DIAGNOSIS — R0609 Other forms of dyspnea: Secondary | ICD-10-CM | POA: Insufficient documentation

## 2021-11-06 DIAGNOSIS — Z6837 Body mass index (BMI) 37.0-37.9, adult: Secondary | ICD-10-CM | POA: Diagnosis present

## 2021-11-06 DIAGNOSIS — K219 Gastro-esophageal reflux disease without esophagitis: Secondary | ICD-10-CM | POA: Insufficient documentation

## 2021-11-06 LAB — MICROALBUMIN RANDOM URINE
ALB/CREAT RATIO URINE RAN: 49 ug/mg — ABNORMAL HIGH (ref 0–30)
ALBUMIN URINE RANDOM: 3.4 mg/dL — ABNORMAL HIGH (ref 0.0–1.9)
CREATININE RANDOM URINE: 70 mg/dL (ref 28–217)

## 2021-11-06 MED ORDER — DICLOFENAC SODIUM 1 % EX GEL
8.0000 g | Freq: Four times a day (QID) | CUTANEOUS | 3 refills | Status: DC
Start: 2021-11-06 — End: 2022-02-24

## 2021-11-06 MED ORDER — ACETAMINOPHEN 500 MG PO TABS
500.00 mg | ORAL_TABLET | ORAL | 3 refills | Status: AC | PRN
Start: 2021-11-06 — End: 2022-11-07

## 2021-11-06 MED ORDER — MONTELUKAST SODIUM 10 MG PO TABS
10.0000 mg | ORAL_TABLET | Freq: Every evening | ORAL | 6 refills | Status: DC
Start: 2021-11-06 — End: 2022-03-18

## 2021-11-06 MED ORDER — VITAMIN D 50 MCG (2000 UT) PO TABS
1.0000 | ORAL_TABLET | Freq: Two times a day (BID) | ORAL | 3 refills | Status: DC
Start: 2021-11-06 — End: 2023-04-06

## 2021-11-06 MED ORDER — RA WRIST BRACE ADJ RIGHT L/XL MISC
0 refills | Status: AC
Start: 2021-11-06 — End: 2022-11-07

## 2021-11-06 MED FILL — VENLAFAXINE 150MG ER: 30 days supply | Qty: 30 | Fill #1 | Status: CP

## 2021-11-06 MED FILL — VITAMIN D 50MCG (2000U): 90 days supply | Qty: 180 | Fill #0

## 2021-11-06 MED FILL — ATENOLOL 100MG: 30 days supply | Qty: 30 | Fill #0 | Status: CP

## 2021-11-06 MED FILL — DICLOFENAC GEL 1%: 4 days supply | Qty: 100 | Fill #0

## 2021-11-06 MED FILL — ACETAMIN 500MG: 10 days supply | Qty: 60 | Fill #0

## 2021-11-06 MED FILL — CLONAZEPAM 1MG: 30 days supply | Qty: 60 | Fill #1 | Status: CP

## 2021-11-06 NOTE — Progress Notes (Signed)
Chief Complaint:   Sara Snyder is a 66 year old female w/ HTN, HLD, palpitations, obesity, secondary hyperparathyroidism, labrynthitis, allergic rhinitis, CKDIII, panic d/o, MDD, anxiety, osteopenia, arthralgias, chronic cough, insomnia, recurrent epistaxis, subclinical hypothyroid, vit d deficiency, b/l renal artery stenosis, and GERD who presents for annual exam.    Last seen at [facility] 09/12/21 televisit w/ Dr. Edison Pace  -BP meds: rx chlorthalidone 42m daily, amlodipine/valsartan 5/320 daily, atenolol 100 mg daily, spironolactone 100 mg daily; BP nml at home; not taking spironolactone    Interval hx->  -09/19/21 Ophtho OV  -09/24/21 psychopharm visit: effexor 1560mAM, klonipin 44m52mID PRN, rozerem (romelteon) 8mg54mS  -09/25/21 TE: needs lab appt for rheum labs (arthralgias)   -10/03/21 TE: referral to rheum for elevated CRP (negative other labs), mild decrease in eGFR, stable on repeat   -10/23/21 missed ENT f/up   -10/28/21: missed derm f/up  -5/13 ENT OV: recurrent nosebleeds and congestion; nasogel QID, stop picking, no nose blowing; cautery at next visit PRN; singulair for congestion      #New problems to discuss  -Wrist pain a/w hand numbness/tingling/weakness    #Active chronic problems  -HTN: chlorthalidone 25mg82mly, amlodipine/valsartan 5/320 daily, atenolol 100 mg daily, not taking spironolactone 100 mg daily; HTN ROS: negative for chest pain, HA; has on going vision changes (has ophtho appt), mild interimittent LE edema, ongoing DOE, and numbness/tingling in R hand a/w wrist pain;   Checks BP at home, usually 100-110/50-60; some days when going to bed 160-170; only checks a few times a month; hasn't regularly checked since going on spironolactone     BP in clinic today 153/72  -HLD: not on statin; LDL 153 06/2021  The 10-year ASCVD risk score (Arnett DK, et al., 2019) is: 15.6%    Values used to calculate the score:      Age: 22 ye29s      Sex: Female      Is Non-Hispanic African American: No      Diabetic: No       Tobacco smoker: No      Systolic Blood Pressure: 153 m332      Is BP treated: Yes      HDL Cholesterol: 44 mg/dL      Total Cholesterol: 215 mg/dL  -DOE: prompted stress which was negative; thinks it's deconditioning d/t weight  -Obesity: Body mass index is 40.86 kg/m.; has been gaining weight; unable to exercise more due to polyarthralgias   -Secondary hyperparathyroidism: PTH and ca last checked 06/2021 nml   -Allergic rhinitis: using singulair   -Recurrent epistaxis: no picking or blowing; nasogel QID; follows with ENT; consideration of ablation if not improved with conservative mgmt   -Panic d/o, MDD, anxiety: follows with psych, on klonopin, venlafaxine  -Osteopenia: 07/2020 T -1.8, plan for q3-5y; vit d deficiency 06/2021  -Vit d deficiency: last checked 06/2021 deficient, not taking supplements  -Insomnia: not taking rozerem or any other sleep aids currently  -Arthralgias: pain in multiple locations (back, hips, wrists, elbows, knees), difficulty walking and sleeping d/t pain, rheum labs positive for elevated CRP; rheum referral placed, pending scheduling; taking tylenol, didn't pick up lidocaine patches  -CKDIII: stable as of 10/2021; no protein as of 10/2019; repeat microalbumin today   -GERD: negative h pylori 09/2021, on PPI   -Hypothyroid: TSH nml 09/2021; on levothyroxine 50mcg78m/l renal artery stenosis: seen on renal duplex [name], and per vascular 06/2020 NTD unless BP or CKD drastically worse   -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 07/2021 and rec'd rogain, spironolactone; had HA on spironolactone which stopped with cessation      #Health maintenance  ZOSTER VACCINE(1 of 2) Never done  Colorectal Cancer Screening due on 12/03/2018  FALL RISK ASSESSMENT: OVER 65 due on 06/13/2021  The 10-year ASCVD risk score (Arnett DK, et al., 2019) is: 15.6%    Values used to calculate the score:      Age: 58 years      Sex: Female      Is Non-Hispanic African American: No      Diabetic: No      Tobacco smoker: No       Systolic Blood Pressure: 694 mmHg      Is BP treated: Yes      HDL Cholesterol: 44 mg/dL      Total Cholesterol: 215 mg/dL  -Last mammo 07/2020, birads 2, q2y  -Last FIT 11/2017, due   -Last pap 03/2019 NIL, HPV negative; next due 03/2024 (unless negatives prior?)  -HIV screen next time getting labs   -Hep c negative 03/2019      PHYSICAL EXAMINATION:  BP (!) 153/72 (Site: LA, Position: Sitting, Cuff Size: Lrg)   Pulse 79   Temp 97 F (36.1 C) (Temporal)   Ht 4' 10.66" (1.49 m)   Wt 90.7 kg (200 lb)   SpO2 96%   BMI 40.86 kg/m   General: well appearing older female in no acute distress  HEENT: normocephalic/atraumatic, MMM, EOMI  Cardiac: RRR, normal s1/s2, no MRG  Pulm: normal work of breathing, CTAB  Abdomen: positive bowel sounds, soft, non distended, no TTP  Extremities: 1+ peripheral edem  Neuro: A&O, CN II-XII grossly intact, moving all extremities      ASSESSMENT/PLAN:  Problem List Items Addressed This Visit        Cardiac and Vasculature    Essential hypertension     BP in clinic today 153/72, uncontrolled. On chlorthalidone 78m daily, amlodipine/valsartan 5/320 daily, atenolol 100 mg daily; not taking spironolactone 100 mg daily (rx for hair loss but stopped taking d/t HA). Checks BP at home, usually 100-110/50-60, but some days when going to bed 160-170. Only checks a few times a month and hasn't regularly checked since going on spironolactone. HTN ROS: negative for chest pain, HA; has on going vision changes (has ophtho appt), mild interimittent LE edema, ongoing DOE, and numbness/tingling in R hand a/w wrist pain. Cr/lytes last checked 10/2021, stable.   Suspect BP is likely uncontrolled at home and not just in clinic. However, given report of BP on lower side at home, will do home BP log prior to adjusting meds.   Plan:  -Home BP log 2 weeks: daily checks, same time each day  -Continue chlorthalidone 240mdaily, amlodipine/valsartan 5/320 daily, atenolol 100 mg daily  -Adjust meds PRN             Hyperlipidemia     Lipids last checked 06/2021 with LDL 153 and ASCVD 15.6%. Unclear why not on statin. Unable to address today but will discuss next visit.   Plan:  -Discuss statin initiation next visit          Bilateral renal artery stenosis [place]    Seen on renal duplex [place]. Per vascular OV 06/2020, NTD unless BP or CKD drastically worse. CKD stable at this time and BP seems uncontrolled but may be either white coat HTN or in the setting of spironolactone DC. No indication to refer back to vascular at this  time.          DOE (dyspnea on exertion)     Has chronic DOE that previously prompted nuclear stress 02/2021 which was negative for ischemia and normal EF. She thinks DOE is d/t deconditioning and weight. No signs of lung disease as etiology so likely is more related to weight. Will work on [employer] and monitor DOE sxs and for signs of lung disease warranting further evaluation.   Plan:  -Encourage weight loss  -Consider further work up pending trajectory and improvement of sxs with weight loss               Endocrine and Metabolic    Secondary hyperparathyroidism ([place])     Likely combo vit d deficiency and CKD. Vit deficient on last check 06/2021, not currently taking supplements. However, PTH and ca normal at that time.   Plan:  -CTM ca, PTH, vit D   -Re-start vit d supplements         Relevant Medications    cholecalciferol (VITAMIN D3) 2000 UNIT tablet    Vitamin D deficiency     Low when last checked 06/2021, not currently taking supplements. Discussed today importance of vit d for bone health, particularly given osteopenia. Re-sent rx for supplements to pharmacy.          Relevant Medications    cholecalciferol (VITAMIN D3) 2000 UNIT tablet    Morbid obesity with BMI 40.0-44.9, adult ([place])     Briefly discussed obesity today and need for weight loss. Brought up idea of medication assistance with GLP1 (contraindicated for phentermine given HTN and evidence better for GLP1 then topamax if she is willing to  do injectables). Discussed that she would need to make lifestyle changes (particularly with diet since physical activity is currently limited by pain) concomitantly with medication use. Agreeable to meed with dietician to discuss diet changes for weight loss and next visit will initiate GLP1 pending no contraindications and amenable to injections.   Plan:  -Dietician referral  -Consider initiating GLP1 next visit   -Encourage physical activity as able          Relevant Orders    REFERRAL TO NUTRITION NON-DIABETES (INT) (Completed)    Other specified hypothyroidism     On levothyroxine 81mg with normal TSH 09/2021. Will repeat levels 09/2022 unless sxs warrant sooner evaluation.          RESOLVED: Obesity: 4'11", 194 Lbs, BMI 40, Feb 2022.       ENT    Allergic rhinitis     Uses singulair and follows with ENT.          Relevant Medications    montelukast (SINGULAIR) 10 MG tablet    Recurrent epistaxis     Follows with ENT. Rec'd: no picking or blowing, and use of nasogel QID. Consideration of ablation if not improved with conservative mgmt.             Gastrointestinal and Abdominal    Gastroesophageal reflux disease without esophagitis     Not addressed today but previously endorsed GERD sxs and was r/o for h pylori. Now on PPI. Will further address next visit.             Genitourinary and Reproductive    Stage 3a chronic kidney disease     Likely d/t HTN and renal artery stenosis. No proteinuria when last checked 2021, due for repeat. Cr/eGFR stable on labs 10/2021.   Plan:  -Repeat microalbuminuira today  Relevant Orders    MICROALBUMIN RANDOM URINE (Completed)       Mental Health    Panic disorder     Follows with psych. On klonopin.          Mild episode of recurrent major depressive disorder ([place])     Follows with psych, on venlafaxine.             Musculoskeletal and Injuries    Osteopenia     DEXA 07/2020 with max T score -1.8, c/w osteopenia. Plan for repeat in q3-5y. Also vit d deficient on 06/2021  labs and not currently taking supplements. Discussed need for supplements in light of osteopenia and sent new rx to pharmacy.   Plan:  -DEXA repeat 2025  -Daily vit D supplements          Polyarthralgia     Pain in multiple locations (back, hips, wrists, elbows, knees), difficulty walking and sleeping d/t pain, rheum labs positive for elevated CRP. Rheum referral placed, pending scheduling. Taking tylenol, didn't pick up lidocaine patches. Advised to avoid NSAIDs given CKD and GERD. Unclear if this is cohesive rheumatologic process, multiple areas of OA, or multiple pathologies.  Plan:  -Schedule rheum appt  -Cont tylenol, lidocaine patches; added diclofenac gel          Relevant Medications    diclofenac (VOLTAREN) 1 % GEL Gel    acetaminophen (TYLENOL) 500 MG tablet       Neuro    Carpal tunnel syndrome of right wrist     Has symmetric polyarthralgias except at wrist where she has only R sided pain which is a/w hand weakness, numbness, and tingling, particularly with certain movements. Suspect she has carpal tunnel contributing to sxs.   Plan:  -Trial wrist brace          Relevant Medications    Elastic Bandages & Supports (RA WRIST BRACE ADJ RIGHT L/XL) MISC       Skin    Hair loss     Previously saw derm, thought to be androgenic. Rec'd rogaine and spironolactone. No longer taking spironolactone given HA. Did not discuss today whether she is using rogaine specifically, but she did mention using a topical medication. Will follow up next visit.          Relevant Medications    Multiple Vitamins-Minerals (HAIR SKIN AND NAILS FORMULA) TABS       Sleep    Psychophysiological insomnia     Not address today other than med rec (not taking rozerem). Will address next visit.         Other Visit Diagnoses     Special screening for malignant neoplasms, colon        Relevant Orders    POC IMMUNOASSAY FECAL OCCULT BLOOD TEST          Follow up: 1 mo for preventative care and chronic problems not finished discussing today      Darl Pikes, MD, MPH  Internal Medicine [phone]

## 2021-11-06 NOTE — Patient Instructions (Addendum)
Tylenol: never take >1,000 mg at a time and never take more than 4,000 mg in 24 hours     BP average to be <140/90

## 2021-11-07 ENCOUNTER — Telehealth (HOSPITAL_BASED_OUTPATIENT_CLINIC_OR_DEPARTMENT_OTHER): Payer: Self-pay

## 2021-11-07 DIAGNOSIS — R0609 Other forms of dyspnea: Secondary | ICD-10-CM | POA: Insufficient documentation

## 2021-11-07 DIAGNOSIS — G5601 Carpal tunnel syndrome, right upper limb: Secondary | ICD-10-CM | POA: Insufficient documentation

## 2021-11-07 DIAGNOSIS — E559 Vitamin D deficiency, unspecified: Secondary | ICD-10-CM | POA: Insufficient documentation

## 2021-11-07 DIAGNOSIS — M797 Fibromyalgia: Secondary | ICD-10-CM | POA: Insufficient documentation

## 2021-11-07 DIAGNOSIS — M2550 Pain in unspecified joint: Secondary | ICD-10-CM | POA: Insufficient documentation

## 2021-11-07 DIAGNOSIS — R040 Epistaxis: Secondary | ICD-10-CM | POA: Insufficient documentation

## 2021-11-07 DIAGNOSIS — I701 Atherosclerosis of renal artery: Secondary | ICD-10-CM | POA: Insufficient documentation

## 2021-11-07 DIAGNOSIS — K219 Gastro-esophageal reflux disease without esophagitis: Secondary | ICD-10-CM | POA: Insufficient documentation

## 2021-11-07 DIAGNOSIS — E669 Obesity, unspecified: Secondary | ICD-10-CM | POA: Insufficient documentation

## 2021-11-07 DIAGNOSIS — E038 Other specified hypothyroidism: Secondary | ICD-10-CM | POA: Insufficient documentation

## 2021-11-07 DIAGNOSIS — L659 Nonscarring hair loss, unspecified: Secondary | ICD-10-CM | POA: Insufficient documentation

## 2021-11-07 MED ORDER — HAIR SKIN AND NAILS FORMULA PO TABS
1.0000 | ORAL_TABLET | Freq: Every day | ORAL | 12 refills | Status: DC
Start: 2021-11-07 — End: 2022-02-02

## 2021-11-07 NOTE — Assessment & Plan Note (Signed)
Previously saw derm, thought to be androgenic. Rec'd rogaine and spironolactone. No longer taking spironolactone given HA. Did not discuss today whether she is using rogaine specifically, but she did mention using a topical medication. Will follow up next visit.

## 2021-11-07 NOTE — Assessment & Plan Note (Signed)
DEXA 07/2020 with max T score -1.8, c/w osteopenia. Plan for repeat in q3-5y. Also vit d deficient on 06/2021 labs and not currently taking supplements. Discussed need for supplements in light of osteopenia and sent new rx to pharmacy.   Plan:  -DEXA repeat 2025  -Daily vit D supplements

## 2021-11-07 NOTE — Assessment & Plan Note (Signed)
Has symmetric polyarthralgias except at wrist where she has only R sided pain which is a/w hand weakness, numbness, and tingling, particularly with certain movements. Suspect she has carpal tunnel contributing to sxs.   Plan:  -Trial wrist brace

## 2021-11-07 NOTE — Assessment & Plan Note (Signed)
Likely d/t HTN and renal artery stenosis. No proteinuria when last checked 2021, due for repeat. Cr/eGFR stable on labs 10/2021.   Plan:  -Repeat microalbuminuira today

## 2021-11-07 NOTE — Assessment & Plan Note (Signed)
Lipids last checked 06/2021 with LDL 153 and ASCVD 15.6%. Unclear why not on statin. Unable to address today but will discuss next visit.   Plan:  -Discuss statin initiation next visit

## 2021-11-07 NOTE — Assessment & Plan Note (Signed)
Likely combo vit d deficiency and CKD. Vit deficient on last check 06/2021, not currently taking supplements. However, PTH and ca normal at that time.   Plan:  -CTM ca, PTH, vit D   -Re-start vit d supplements

## 2021-11-07 NOTE — Assessment & Plan Note (Signed)
Has chronic DOE that previously prompted nuclear stress 02/2021 which was negative for ischemia and normal EF. She thinks DOE is d/t deconditioning and weight. No signs of lung disease as etiology so likely is more related to weight. Will work on [employer] and monitor DOE sxs and for signs of lung disease warranting further evaluation.   Plan:  -Encourage weight loss  -Consider further work up pending trajectory and improvement of sxs with weight loss

## 2021-11-07 NOTE — Assessment & Plan Note (Signed)
Not addressed today but previously endorsed GERD sxs and was r/o for h pylori. Now on PPI. Will further address next visit.

## 2021-11-07 NOTE — Assessment & Plan Note (Signed)
Follows with ENT. Rec'd: no picking or blowing, and use of nasogel QID. Consideration of ablation if not improved with conservative mgmt.

## 2021-11-07 NOTE — Telephone Encounter (Signed)
TC to pt  Spoke with daughter [name]  Informed of results and plan  Reminded about BP log and proper BP technique  Verbalized understanding  Appt scheduled with Dr Geri Seminole 12/22/21  All questions answered    Dairl Ponder, RN, 11/07/2021 5:24 PM

## 2021-11-07 NOTE — Assessment & Plan Note (Signed)
Uses singulair and follows with ENT.

## 2021-11-07 NOTE — Assessment & Plan Note (Signed)
Pain in multiple locations (back, hips, wrists, elbows, knees), difficulty walking and sleeping d/t pain, rheum labs positive for elevated CRP. Rheum referral placed, pending scheduling. Taking tylenol, didn't pick up lidocaine patches. Advised to avoid NSAIDs given CKD and GERD. Unclear if this is cohesive rheumatologic process, multiple areas of OA, or multiple pathologies.  Plan:  -Schedule rheum appt  -Cont tylenol, lidocaine patches; added diclofenac gel

## 2021-11-07 NOTE — Assessment & Plan Note (Signed)
Low when last checked 06/2021, not currently taking supplements. Discussed today importance of vit d for bone health, particularly given osteopenia. Re-sent rx for supplements to pharmacy.

## 2021-11-07 NOTE — Assessment & Plan Note (Signed)
Briefly discussed obesity today and need for weight loss. Brought up idea of medication assistance with GLP1 (contraindicated for phentermine given HTN and evidence better for GLP1 then topamax if she is willing to do injectables). Discussed that she would need to make lifestyle changes (particularly with diet since physical activity is currently limited by pain) concomitantly with medication use. Agreeable to meed with dietician to discuss diet changes for weight loss and next visit will initiate GLP1 pending no contraindications and amenable to injections.   Plan:  -Dietician referral  -Consider initiating GLP1 next visit   -Encourage physical activity as able

## 2021-11-07 NOTE — Assessment & Plan Note (Signed)
Seen on renal duplex US 10/2019. Per vascular OV 06/2020, NTD unless BP or CKD drastically worse. CKD stable at this time and BP seems uncontrolled but may be either white coat HTN or in the setting of spironolactone DC. No indication to refer back to vascular at this time.

## 2021-11-07 NOTE — Assessment & Plan Note (Signed)
Follows with psych. On klonopin.

## 2021-11-07 NOTE — Assessment & Plan Note (Signed)
Not address today other than med rec (not taking rozerem). Will address next visit.

## 2021-11-07 NOTE — Assessment & Plan Note (Addendum)
On levothyroxine with normal TSH 09/2021. Will repeat levels 09/2022 unless sxs warrant sooner evaluation.

## 2021-11-07 NOTE — Assessment & Plan Note (Signed)
BP in clinic today 153/72, uncontrolled. On chlorthalidone 25mg  daily, amlodipine/valsartan 5/320 daily, atenolol 100 mg daily; not taking spironolactone 100 mg daily (rx for hair loss but stopped taking d/t HA). Checks BP at home, usually 100-110/50-60, but some days when going to bed 160-170. Only checks a few times a month and hasn't regularly checked since going on spironolactone. HTN ROS: negative for chest pain, HA; has on going vision changes (has ophtho appt), mild interimittent LE edema, ongoing DOE, and numbness/tingling in R hand a/w wrist pain. Cr/lytes last checked 10/2021, stable.   Suspect BP is likely uncontrolled at home and not just in clinic. However, given report of BP on lower side at home, will do home BP log prior to adjusting meds.   Plan:  -Home BP log 2 weeks: daily checks, same time each day  -Continue chlorthalidone 25mg  daily, amlodipine/valsartan 5/320 daily, atenolol 100 mg daily  -Adjust meds PRN

## 2021-11-07 NOTE — Telephone Encounter (Signed)
Hi, can you please call Annabell and let her know that her urine showed elevated protein levels which could be a sign that her kidney function is worsening. We will need to repeat the urine test at our next visit and if the protein is still elevated, we made need to adjust some of her medications.     Please also remind her about the 2 week BP log (record daily measurements, same time, feet flat, arm relaxed, after sitting for at least 83m) and to schedule a follow up appointment with me (next available is fine).     Thank you!

## 2021-11-07 NOTE — Assessment & Plan Note (Signed)
Follows with psych, on venlafaxine.

## 2021-11-21 ENCOUNTER — Encounter (HOSPITAL_BASED_OUTPATIENT_CLINIC_OR_DEPARTMENT_OTHER): Payer: Self-pay

## 2021-11-21 ENCOUNTER — Telehealth (HOSPITAL_BASED_OUTPATIENT_CLINIC_OR_DEPARTMENT_OTHER): Payer: Self-pay

## 2021-11-21 NOTE — Telephone Encounter (Signed)
Hi, can you please call Sara Snyder or daughter Sara Snyder to check in on BP log? She recently stopped spironolactone so I want to make sure BP is OK off this medication (was a little high on isolated clinic reading). Thanks!

## 2021-11-21 NOTE — Telephone Encounter (Signed)
Spoke with pt    Taking all meds as prescribed    Takes BP at night  5/22: 140/70  5/23: 160/80  5/24: 150/80  5/25: 130/70  5/26: 110/70  5/27:130/70  5/28:110/60  5/29:140/70    Routing to provider    Paulino Rily, RN, 11/21/2021

## 2021-11-21 NOTE — Telephone Encounter (Signed)
No changes needed at this time. I will let them know via mychart. Thanks!

## 2021-11-24 ENCOUNTER — Encounter (HOSPITAL_BASED_OUTPATIENT_CLINIC_OR_DEPARTMENT_OTHER): Payer: Self-pay | Admitting: Student in an Organized Health Care Education/Training Program

## 2021-12-01 MED FILL — OMEPRAZOLE 20MG: 90 days supply | Qty: 90 | Fill #0

## 2021-12-05 ENCOUNTER — Encounter (HOSPITAL_BASED_OUTPATIENT_CLINIC_OR_DEPARTMENT_OTHER): Payer: Self-pay

## 2021-12-06 ENCOUNTER — Ambulatory Visit: Payer: No Typology Code available for payment source | Attending: Otolaryngology | Admitting: Otolaryngology

## 2021-12-06 ENCOUNTER — Other Ambulatory Visit: Payer: Self-pay

## 2021-12-06 ENCOUNTER — Encounter (HOSPITAL_BASED_OUTPATIENT_CLINIC_OR_DEPARTMENT_OTHER): Payer: Self-pay | Admitting: Otolaryngology

## 2021-12-06 DIAGNOSIS — J3081 Allergic rhinitis due to animal (cat) (dog) hair and dander: Secondary | ICD-10-CM | POA: Diagnosis present

## 2021-12-06 DIAGNOSIS — R040 Epistaxis: Secondary | ICD-10-CM | POA: Diagnosis present

## 2021-12-06 DIAGNOSIS — R0981 Nasal congestion: Secondary | ICD-10-CM | POA: Insufficient documentation

## 2021-12-06 MED ORDER — LORATADINE 10 MG PO TABS
10.0000 mg | ORAL_TABLET | Freq: Every day | ORAL | 6 refills | Status: DC
Start: 2021-12-06 — End: 2021-12-08

## 2021-12-06 MED FILL — LORATADINE  10MG: 90 days supply | Qty: 90 | Fill #0

## 2021-12-06 NOTE — Progress Notes (Signed)
HPI: 66 year old female Sara Snyder complains of chronic issues with nasal congestion and with nosebleeds as well - and noting that with since last visit and with the issues not resolved. She did not want to use the bactroban and switched to a spray and also to singulair, and she is here with increased left sided epistaxis.     Daughters here as well- This interview was conducted with the aid of an telephone interpreter-Portugese.    Symptom Relief No  Symptom Exacerbation No    PMH of Asthma  No.  PMHx of Allergy testing and Treatment:     Past Medical History:  09/11/2018: Chronic kidney disease, stage III (moderate) (HCC)  08/14/2019: COVID-19      Comment:  Risk category:   high  Date of symptom onset: 08/12/19                 Tested? Positive  Risk factors: CKD, BMI > 30, HTN                 Clinical Course First date of symptoms:  08/12/19                 08/15/19: (DOI 3): Pt states she is feeling well. Slight                ST and sinus congestion. No cough, fever, SOB, DOE or                edema. F/U in 3 days.  08/18/2019: DOI 7- Feeling better                over all, stable vitals, CM f/u in 2 days. 08/20/19 DOI 9-               stable sx; o2 ex  No date: HTN (hypertension)  01/05/2018: Mild episode of recurrent major depressive disorder (HCC)  02/20/2021: Osteopenia  11/29/2017: Panic disorder  No date: Wears eyeglasses    Review of Patient's Allergies indicates:   Pollen extract          Cough    Multiple Vitamins-Minerals (HAIR SKIN AND NAILS FORMULA) TABS, Take 1 capsule by mouth in the morning., Disp: 30 tablet, Rfl: 12  Elastic Bandages & Supports (RA WRIST BRACE ADJ RIGHT L/XL) MISC, Use as able for carpal tunnel syndrome, Disp: 1 each, Rfl: 0  diclofenac (VOLTAREN) 1 % GEL Gel, Apply 8 g topically in the morning and 8 g at noon and 8 g in the evening and 8 g before bedtime., Disp: 100 g, Rfl: 3  cholecalciferol (VITAMIN D3) 2000 UNIT tablet, Take 1 tablet by mouth in the morning and 1 tablet before  bedtime., Disp: 180 tablet, Rfl: 3  montelukast (SINGULAIR) 10 MG tablet, Take 1 tablet by mouth nightly, Disp: 30 tablet, Rfl: 6  acetaminophen (TYLENOL) 500 MG tablet, Take 1 tablet by mouth every 4 (four) hours as needed for Pain, Disp: 60 tablet, Rfl: 3  omeprazole (PRILOSEC) 20 MG capsule, Take 1 capsule by mouth in the morning., Disp: 90 capsule, Rfl: 0  venlafaxine (EFFEXOR-XR) 150 MG 24 hr capsule, 1 capsule daily, Disp: 30 capsule, Rfl: 3  amlodipine-valsartan (EXFORGE) 5-320 MG per tablet, Take 1 tablet by mouth in the morning., Disp: 30 tablet, Rfl: 11  levothyroxine (SYNTHROID) 50 MCG tablet, Take 1 tablet by mouth every morning before breakfast, Disp: 90 tablet, Rfl: 3  clonazePAM (KLONOPIN) 1 MG tablet, 1 tablet twice daily, PRN, Disp: 60 tablet,  Rfl: 1  chlorthalidone (HYGROTEN) 25 MG tablet, Take 1 tablet by mouth in the morning., Disp: 90 tablet, Rfl: 0  atenolol (TENORMIN) 100 MG tablet, TAKE 1 TABLET BY MOUTH NIGHTLY, Disp: 30 tablet, Rfl: 0  lidocaine (LIDODERM) 5 % patch, Place 1 patch onto the skin in the morning., Disp: 30 patch, Rfl: 2    No current facility-administered medications on file prior to visit.      Social and family history reviewed either in EPIC or with patient and are not contributory unless specifically noted in HPI.    Family History of Allergies No.    Smoking: No.    Review of Systems  Constitutional normal.  Eyes/Blurry vision No.  CV/Palpitations No.  Resp/SOB No.  Endocrine/Feeling tired No.  Integ/Skin lesions No.  Psych normal.  GI/Heartburn No.  Neurologic normal.  Hem/Easy Bruising No.  Environmental Allergy No.    Physical Exam  There were no vitals filed for this visit.    General: WD WN female with a normal voice.  Face: No lesions.  Facial Strength: Normal and symmetric.  Eyes: PERRLA and EOMI.  Ear R: No lesions. Cerumen: none / minimal. Canal clear. TM clear.   Ear L:  No lesions. Cerumen: none / minimal. Canal clear. TM clear.  Nose:  Septum midline and with  anterior excoriation noted on both sides.  Turbinates normal size.  No polyps or masses. Nasal cautery done with tolerated well following numbing with lido/afrin on pledget on the left side. Noted to have a prominent two vessels at her inferior septum and she tolerated well and cauterized both on the left anterior.   Nasopharynx:  CNV    Oral Cavity/Oropharynx:  Mucous membranes moist.  Tonsils 0+.  Teeth in good condition.  Tongue no lesions.  Neck:  Trachea midline.  No palpable masses.  Thyroid: Normal to palpation.  Lymph:  No palpable nodes.  Subman glands:  Normal size, non-tender.  Parotids: Normal size, no masses or nodes.    Data reviewed for today's visit:   External notes, reviewed, including from prior visits.   Test results: None  Tests ordered: None  Independent historian required: No.  Independent test interpretation performed: No.  Discussion with other HCP: No.    A/P  66 year old female was evaluated and we recommend the following:    (R04.0) Epistaxis  (primary encounter diagnosis)  Plan: No nose blowing, picking, heavy lifting or bending. Sneeze with mouth open. Cold mist humidifier at night and will help with exacerbation of nosebleeds. If their nosebleeds do not improve, they may require a visit to the emergency room, further procedures such as nasal cautery or may be severe enough to need to go to the operating room. In addition to humidity, we advised to minimize hot showers and hot beverages as these may also precipitate nosebleeds.    (R09.81) Nasal congestion  Plan: The patient was counseled on the possible side effects of second generation antihistamines (loratadine, fexofenadine, cetirizine, levocetirizine) including the possibility of somnolence, headache, fatigue, diarrhea. The risks vs. benefits were discussed regarding the uses of these medication for patients with allergic rhinitis. We counseled them that if they do not try this medication for at least a month, symptoms may worsen  and they will have a severe allergy exacerbation and this may impact their quality of life (eg. Sneezing, itchy eyes, runny nose).   It was recommended that the patient reach out to [name] if they are unable to tolerate the  medication due to any side effects.       (J30.81) Allergic rhinitis due to animal hair and dander  Plan: Ointment twice daily and with chronic issues with chronic rhinitis as well.       I spent a total of 30 minutes on this visit on the date of service (total time includes all activities performed on the date of service), including cautery done and also d/w plan as well.    Lynn Ito, MD, MBA  Otolaryngology- Head and Neck Surgery

## 2021-12-08 ENCOUNTER — Encounter (HOSPITAL_BASED_OUTPATIENT_CLINIC_OR_DEPARTMENT_OTHER): Payer: Self-pay

## 2021-12-08 ENCOUNTER — Encounter (HOSPITAL_BASED_OUTPATIENT_CLINIC_OR_DEPARTMENT_OTHER): Payer: Self-pay | Admitting: Internal Medicine

## 2021-12-08 ENCOUNTER — Encounter (HOSPITAL_BASED_OUTPATIENT_CLINIC_OR_DEPARTMENT_OTHER): Payer: Self-pay | Admitting: Otolaryngology

## 2021-12-08 DIAGNOSIS — G5601 Carpal tunnel syndrome, right upper limb: Secondary | ICD-10-CM

## 2021-12-08 DIAGNOSIS — H8309 Labyrinthitis, unspecified ear: Secondary | ICD-10-CM

## 2021-12-08 DIAGNOSIS — I10 Essential (primary) hypertension: Secondary | ICD-10-CM

## 2021-12-08 DIAGNOSIS — F330 Major depressive disorder, recurrent, mild: Secondary | ICD-10-CM

## 2021-12-08 MED FILL — VENLAFAXINE 150MG ER: 30 days supply | Qty: 30 | Fill #2

## 2021-12-08 NOTE — Telephone Encounter (Signed)
PER Pharmacy, Sara Snyder is a 66 year old female has requested a refill of      -  Meclizine       Last Office Visit: 11/06/2021 with Janna Arch  Last Physical Exam: 06/13/2020     There are no preventive care reminders to display for this patient.     Other Med Adult:  Most Recent BP Reading(s)  11/06/21 : (!) 153/72        Cholesterol (mg/dL)   Date Value   16/03/9603 215     LOW DENSITY LIPOPROTEIN DIRECT (mg/dL)   Date Value   54/02/8118 153     HIGH DENSITY LIPOPROTEIN (mg/dL)   Date Value   14/78/2956 44     TRIGLYCERIDES (mg/dL)   Date Value   21/30/8657 148         THYROID SCREEN TSH REFLEX FT4 (uIU/mL)   Date Value   09/29/2021 2.730         TSH (THYROID STIM HORMONE) (uIU/mL)   Date Value   01/14/2021 7.090 (H)       HEMOGLOBIN A1C (%)   Date Value   07/14/2021 6.3 (H)       No results found for: POCA1C      No results found for: INR    SODIUM (mmol/L)   Date Value   10/22/2021 139       POTASSIUM (mmol/L)   Date Value   10/22/2021 4.0           CREATININE (mg/dL)   Date Value   84/69/6295 1.2        Documented patient preferred pharmacies:    [facility] [place] [place] - [address]  Phone: [phone] Fax: [phone]

## 2021-12-08 NOTE — Telephone Encounter (Signed)
Hello,    Please disregard any MyChart messages you may have received. I have confirmed [facility] has refills on file for your medication(s) and they are currently ready for pick up at your pharmacy.    In the future, please contact your pharmacy directly at [phone] to refill your medication.      If you have any questions please us at [phone] option [phone]    Thank you

## 2021-12-09 ENCOUNTER — Encounter (HOSPITAL_BASED_OUTPATIENT_CLINIC_OR_DEPARTMENT_OTHER): Payer: Self-pay

## 2021-12-09 DIAGNOSIS — H8309 Labyrinthitis, unspecified ear: Secondary | ICD-10-CM

## 2021-12-09 MED ORDER — ATENOLOL 100 MG PO TABS
100.0000 mg | ORAL_TABLET | Freq: Every evening | ORAL | 1 refills | Status: DC
Start: 2021-12-09 — End: 2022-05-11

## 2021-12-09 MED ORDER — MECLIZINE HCL 25 MG PO TABS
25.0000 mg | ORAL_TABLET | Freq: Three times a day (TID) | ORAL | 2 refills | Status: DC | PRN
Start: 2021-12-09 — End: 2022-05-11

## 2021-12-09 MED ORDER — LORATADINE 10 MG PO TABS
10.0000 mg | ORAL_TABLET | Freq: Every day | ORAL | 6 refills | Status: DC
Start: 2021-12-09 — End: 2022-05-25

## 2021-12-09 MED ORDER — CHLORTHALIDONE 25 MG PO TABS
25.0000 mg | ORAL_TABLET | Freq: Every day | ORAL | 0 refills | Status: DC
Start: 2021-12-09 — End: 2022-05-11

## 2021-12-09 MED FILL — CHLORTHALID 25MG: 90 days supply | Qty: 90 | Fill #0

## 2021-12-09 MED FILL — MECLIZINE 25MG: 30 days supply | Qty: 90 | Fill #0

## 2021-12-09 MED FILL — ATENOLOL 100MG: 90 days supply | Qty: 90 | Fill #0

## 2021-12-09 NOTE — Telephone Encounter (Signed)
PER Pharmacy,  Sara Snyder is a 66 year old female who has requested a refill of atenolol.    This refill request failed protocol because pt's most recent BP was above 140/90 mmHg.  For this reason, only a one-month supply was approved on 08/27/21.  Sending request to provider for further review. Pt does have upcoming appt scheduled on 12/22/21    - last office visit: 11/06/21 with pcp  - last physical exam: 06/13/20    There are no preventive care reminders to display for this patient.      HTN Med:    Most Recent BP Reading(s)  11/06/21 : (!) 153/72  07/14/21 : (!) 142/68  06/05/21 : 126/68        Documented patient preferred pharmacies:     [facility] [place], [place] - [address]  Phone: [phone] Fax: [phone]

## 2021-12-09 NOTE — Addendum Note (Signed)
Addended byJanna Arch on: 12/09/2021 03:38 PM     Modules accepted: Orders

## 2021-12-10 ENCOUNTER — Encounter (HOSPITAL_BASED_OUTPATIENT_CLINIC_OR_DEPARTMENT_OTHER): Payer: Self-pay

## 2021-12-10 NOTE — Telephone Encounter (Signed)
Med on file with Pharmacy:  [facility] confirmed that meclizine has active refills remaining. No refill is required at this time.

## 2021-12-12 ENCOUNTER — Ambulatory Visit (HOSPITAL_BASED_OUTPATIENT_CLINIC_OR_DEPARTMENT_OTHER): Payer: No Typology Code available for payment source

## 2021-12-16 ENCOUNTER — Ambulatory Visit: Payer: No Typology Code available for payment source | Attending: Ophthalmology | Admitting: Ophthalmology

## 2021-12-16 ENCOUNTER — Other Ambulatory Visit: Payer: Self-pay

## 2021-12-16 ENCOUNTER — Encounter (HOSPITAL_BASED_OUTPATIENT_CLINIC_OR_DEPARTMENT_OTHER): Payer: Self-pay | Admitting: Ophthalmology

## 2021-12-16 DIAGNOSIS — H401131 Primary open-angle glaucoma, bilateral, mild stage: Secondary | ICD-10-CM | POA: Insufficient documentation

## 2021-12-16 DIAGNOSIS — H524 Presbyopia: Secondary | ICD-10-CM | POA: Insufficient documentation

## 2021-12-16 DIAGNOSIS — H25813 Combined forms of age-related cataract, bilateral: Secondary | ICD-10-CM | POA: Diagnosis present

## 2021-12-16 DIAGNOSIS — H25811 Combined forms of age-related cataract, right eye: Secondary | ICD-10-CM

## 2021-12-16 DIAGNOSIS — H5203 Hypermetropia, bilateral: Secondary | ICD-10-CM | POA: Insufficient documentation

## 2021-12-16 DIAGNOSIS — H52203 Unspecified astigmatism, bilateral: Secondary | ICD-10-CM | POA: Insufficient documentation

## 2021-12-16 MED ORDER — LATANOPROST 0.005 % OP SOLN
1.0000 [drp] | Freq: Every evening | OPHTHALMIC | 12 refills | Status: DC
Start: 2021-12-16 — End: 2023-08-28

## 2021-12-16 MED FILL — LATANOPROST SOL 0.005%: 18 days supply | Qty: 3 | Fill #0

## 2021-12-16 NOTE — Progress Notes (Signed)
For evaluation of blurred vision OU and f/u glaucoma suspect to evaluate risk of glaucoma.    Impression,    1.  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    The patient has noticed decreasing vision in her both eye.    The patient was examined and found to a visually significant cataract in the both eye.    She  was having difficulty seeing for her activities of daily living.    The risk, benefits, and alternatives of cataract surgery have been discussed with the patient.    She  would like to have cataract surgery on the both eye.  The patient will be returning for preoperative eye measurements and seeing her PCP for preoperative medical clearance.    Lens options including "premium," toric, and standard monovision intraocular lens options were discussed at length.        2. Hyperopia, astigmatism, presbyopia-    Plan-she was refracted and given a prescription to get glasses that she can fill at any optical shop. She was told not to get glasses if having cataract surgery    3.Glaucoma suspect-high risk    IOP was 15 OD and 14 OS which is not elevated    Disc OCT 03/29/2018 revealed an average retinal NFL thickness of 77 microns OD with a C/D of 0.70 and 65 microns OD with a C/D of 0.76    Disc OCT ([facility]) had weak signal strength OD/unusable and average retinal NFL thickness of 62 microns OS with a C/D or 0.75 and thin superior and temporal RNFL OS    HVF revealed infero nasal defect OD and temporal defect OS    Plan-start Latanoprost OU qhs           4. Hx "retina problem"    Optos-No gross retinal pathology

## 2021-12-16 NOTE — Progress Notes (Signed)
pt here for comprehensive eye exam    Pt had new glasses made a few months ago and overall va is blurry, pt is even afraid of walking because she's afraid she will fall  Pt denies flashes or floaters but see's   " fireflies "   Doesn't use eye gtts    Ocular HX-Glaucoma suspect  Hyperopia,astigmatism and presbyopia  Cataract ou    LEE-11-26-20

## 2021-12-17 ENCOUNTER — Ambulatory Visit (HOSPITAL_BASED_OUTPATIENT_CLINIC_OR_DEPARTMENT_OTHER): Payer: No Typology Code available for payment source | Admitting: "Psychiatric/Mental Health

## 2021-12-18 ENCOUNTER — Encounter (HOSPITAL_BASED_OUTPATIENT_CLINIC_OR_DEPARTMENT_OTHER)
Payer: No Typology Code available for payment source | Admitting: Student in an Organized Health Care Education/Training Program

## 2021-12-22 ENCOUNTER — Other Ambulatory Visit (HOSPITAL_BASED_OUTPATIENT_CLINIC_OR_DEPARTMENT_OTHER): Payer: Self-pay | Admitting: "Psychiatric/Mental Health

## 2021-12-22 ENCOUNTER — Encounter (HOSPITAL_BASED_OUTPATIENT_CLINIC_OR_DEPARTMENT_OTHER): Payer: Self-pay

## 2021-12-22 ENCOUNTER — Other Ambulatory Visit: Payer: Self-pay

## 2021-12-22 ENCOUNTER — Ambulatory Visit: Payer: No Typology Code available for payment source

## 2021-12-22 VITALS — BP 127/79 | HR 75 | Temp 98.1°F | Resp 18 | Ht 58.66 in | Wt 199.0 lb

## 2021-12-22 DIAGNOSIS — Z1211 Encounter for screening for malignant neoplasm of colon: Secondary | ICD-10-CM | POA: Diagnosis present

## 2021-12-22 DIAGNOSIS — N1831 Chronic kidney disease, stage 3a: Secondary | ICD-10-CM | POA: Diagnosis present

## 2021-12-22 DIAGNOSIS — E669 Obesity, unspecified: Secondary | ICD-10-CM | POA: Insufficient documentation

## 2021-12-22 DIAGNOSIS — R0609 Other forms of dyspnea: Secondary | ICD-10-CM | POA: Diagnosis present

## 2021-12-22 DIAGNOSIS — E785 Hyperlipidemia, unspecified: Secondary | ICD-10-CM | POA: Insufficient documentation

## 2021-12-22 DIAGNOSIS — I10 Essential (primary) hypertension: Secondary | ICD-10-CM | POA: Insufficient documentation

## 2021-12-22 DIAGNOSIS — M2550 Pain in unspecified joint: Secondary | ICD-10-CM | POA: Insufficient documentation

## 2021-12-22 DIAGNOSIS — Z01818 Encounter for other preprocedural examination: Secondary | ICD-10-CM | POA: Diagnosis present

## 2021-12-22 DIAGNOSIS — Z124 Encounter for screening for malignant neoplasm of cervix: Secondary | ICD-10-CM | POA: Insufficient documentation

## 2021-12-22 DIAGNOSIS — Z0000 Encounter for general adult medical examination without abnormal findings: Secondary | ICD-10-CM | POA: Diagnosis present

## 2021-12-22 DIAGNOSIS — G5601 Carpal tunnel syndrome, right upper limb: Secondary | ICD-10-CM | POA: Diagnosis present

## 2021-12-22 DIAGNOSIS — Z6841 Body Mass Index (BMI) 40.0 and over, adult: Secondary | ICD-10-CM | POA: Diagnosis present

## 2021-12-22 DIAGNOSIS — K219 Gastro-esophageal reflux disease without esophagitis: Secondary | ICD-10-CM | POA: Insufficient documentation

## 2021-12-22 LAB — MICROALBUMIN RANDOM URINE
ALB/CREAT RATIO URINE RAN: 23 ug/mg (ref 0–30)
ALBUMIN URINE RANDOM: 2.5 mg/dL — ABNORMAL HIGH (ref 0.0–1.9)
CREATININE RANDOM URINE: 106 mg/dL (ref 28–217)

## 2021-12-22 MED ORDER — ATORVASTATIN CALCIUM 20 MG PO TABS
20.0000 mg | ORAL_TABLET | Freq: Every day | ORAL | 3 refills | Status: DC
Start: 2021-12-22 — End: 2022-02-24

## 2021-12-22 MED FILL — ATORVASTATIN 20MG: 90 days supply | Qty: 90 | Fill #0

## 2021-12-22 MED FILL — OMEPRAZOLE 20MG: 90 days supply | Qty: 90 | Fill #0 | Status: CP

## 2021-12-22 NOTE — Progress Notes (Unsigned)
Chief Complaint:   Sara Snyder is a 66 year old year-old female w/ HTN, HLD, prediabetes, palpitations, obesity, secondary hyperparathyroidism, labrynthitis, allergic rhinitis, CKDIII, panic d/o, MDD, anxiety, osteopenia, arthralgias, chronic cough, insomnia, recurrent epistaxis, subclinical hypothyroid, vit d deficiency, b/l renal artery stenosis, and GERD who presents for follow up of     Last seen by me 518/23/->  -Wrist pain: Suspect carpal tunnel, trial wrist brace  -HTN: Mildly elevated in clinic, home BP log  -HLD: ASCVD 15.6% with LDL 153, statin indicated  -DOE: Negative stress, symptoms stable, may be related to weight gain and deconditioning  -Obesity: Dietitian referral, open to GLP-1  -Arthralgias: Mildly elevated CRP, rheum referral in place    Interval hx->   -TE 11/21/2021: Average SBP 133 on BP log, no changes  -12/06/2021 ENT OV: No changes to plan  -12/16/2021 ophtho OV: Significant cataracts bilaterally, discussed surgical options, needs preop clearance; also with high risk glaucoma, started on latanoprost  -12/17/2021 missed psych televisit  -12/18/2021 [facility] follow-up    Today->   Problem List Items Addressed This Visit        Cardiac and Vasculature    Essential hypertension     BP well controlled today in clinic.  Elevated microalbumin last visit.  We will repeat today    Plan:  -Continue chlorthalidone 25mg  daily, amlodipine/valsartan 5/320 daily, atenolol 100 mg daily  -Repeat microalbumin  -Repeat BMP in November         Hyperlipidemia     Lipids last checked 10/2021 with ASCVD greater than 15%.  Discussed risks benefits of statin and she is amenable to starting.  Plan:  -Start atorvastatin 20 mg daily          Relevant Medications    atorvastatin (LIPITOR) 20 MG tablet    DOE (dyspnea on exertion)     HPI:  -Stable, no changes    Assessment:  Stable dyspnea on exertion with negative stress test.  Likely noncardiac in etiology, could be related to deconditioning.  No other  respiratory symptoms, lungs clear to auscultation on exam, less likely pulmonary etiology.  No signs of heart failure on exam.    Plan:  -Continue to monitor and encourage weight loss                Endocrine and Metabolic    Obesity (BMI 30-39.9)     HPI:  -BMI 39  -Feels frustrated she can't be more active because of the arthralgias (OV scheduled for 7/5 with rheum)   -Has not scheduled visit with nutritionist yet  -Feels shortness of breath is related to her weight and deconditioning    Assessment:  Discussed continued need to work on diet changes and increase in physical activity.  Discussed medication options today.  Given diagnosis of hypertension, is not a candidate for phentermine/topiramate combo.  She does not have any history of pancreatitis or gallbladder disease or personal or family history of thyroid cancer, so she is eligible for a GLP-1 agonist.  She is amenable to an injectable medication and would like to start.  Discussed importance of concomitantly working on diet and exercise, and risk of regaining weight with cessation of medication.  Given insurance, unable to prescribe semaglutide. Will prescribe weekly Trulicity as opposed to Victoza to avoid daily injection,    Plan:  -Continue to encourage diet changes and increasing physical activity  -Encouraged her to schedule nutrition visit  -Start Trulicity 0.75 mg weekly, increase to 1.5 mg in 4  weeks if tolerating  -Educated about GI side effects  -1 month follow-up             Relevant Medications    dulaglutide (TRULICITY) 0.75 MG/0.5ML injection       Gastrointestinal and Abdominal    Gastroesophageal reflux disease without esophagitis     HPI:  -Not fully discussed today  -Briefly at the end of the visit mentioned she is concerned about long-term side effects of omeprazole  -Reports she has symptoms if she stops omeprazole    Assessment:  GERD currently well controlled with PPI but unable to discontinue.  No prior EGD.  Will discuss symptoms  further next visit and consider EGD.    Plan:  -Continue PPI  -Readdress symptoms next visit  -Consider EGD in the future                Genitourinary and Reproductive    Stage 3a chronic kidney disease     HPI:  -New microalbuminuria last visit    Plan:  -Repeat microalbuminuria today and consider adjustment in ARB or initiation of SGLT2 if worsened or continues to be elevated  -Repeat BMP November             Relevant Orders    MICROALBUMIN RANDOM URINE (Completed)    Cervical cancer screening     -Last pap 03/2019 NIL, HPV negative  -Reports all normal/negative paps in the past ([facility] in [place])  -Reports last pap prior to 03/2019 was ~4 years prior      Plan:  -Given age over 22 and 2 negative Paps with cotesting in past 10 years okay to discontinue further cervical cancer screening                Health Encounters    Preoperative examination     Preoperative cardiovascular risk assessment  This ASA 2 patient has HTN, RCRI score 0 (0.4% risk of major cardiac event), Chales Abrahams Perioperative Cardiac Risk 0.1%. NSQIP Risk Calculator w 2% risk of serious complication, and 0% risk of cardiac complication. Functional capacity by history not capable of METS>4 due to MSK pain and he is undergoing a low risk surgery.     Has baseline dyspnea on exertion that is thought to be noncardiac in nature.  Her functional capacity and dyspnea is stable since negative nuclear stress test 02/2021.  Given low risk scores and stable symptoms since recent negative stress test, no further work-up is required.    Medically optimized for cataract surgery.           Healthcare maintenance     HPI:  -Last mammo 07/2020, birads 2, q2y due 07/2022  -Last FIT 11/2017, due   -No HIV screen on  -Hep c negative 03/2019  -Due for zoster and COVID booster    Plan:  -Colo guard ordered  -HIV screen next time getting labs  -Discuss vaccines next visit                Musculoskeletal and Injuries    Polyarthralgia     HPI:  -Continues to be bothered by joint  pain bilaterally and knees and upper extremities    Assessment:  As rheumatology consult in a few days.    Plan:  -Follow-up rheumatology office visit  -Continue Tylenol as needed                Neuro    Carpal tunnel syndrome of right wrist     HPI:  -Continues  to be very bothered by right arm, wrist, hand pain and numbness  -Had difficulty wearing the wrist brace during the day due to it interfering with her work in the kitchen at around the house  -Tried wearing brace at night but felt like it was too tight    Assessment:  Still suspect some element of carpal tunnel contributing to right extremity pain though does have rheumatology office visit coming up to explore polyarthralgias.  Discussed with her that wearing the brace will be essential in improving her symptoms and that if this brace does not fit well she should try different brace.  We will continue avoiding NSAIDs given CKD.    Plan:  -Continue to encourage brace use  -Follow-up with rheumatology appointment  -If symptoms not improving, consider Ortho referral for steroid injection            Other Visit Diagnoses     Colon cancer screening        Relevant Orders    STOOL DNA           PHYSICAL EXAMINATION:  BP 127/79 (Site: LA, Position: Sitting, Cuff Size: Lrg)   Pulse 75   Temp 98.1 F (36.7 C) (Temporal)   Resp 18   Ht 4' 10.66" (1.49 m)   Wt 90.3 kg (199 lb)   SpO2 99%   BMI 40.66 kg/m   General: well appearing older female in no acute distress  HEENT: normocephalic/atraumatic, MMM, EOMI  Cardiac: RRR, normal s1/s2, no MRG  Pulm: normal work of breathing, CTAB  Extremities: trace pedal edema   Neuro: A&O, CN grossly in tact, moving all extremities  Psych: normal mood and affect     Follow-up:  67mo for obesity and RUE pain      Darl Pikes, MD, MPH  Internal Medicine [phone]

## 2021-12-23 MED ORDER — TRULICITY 0.75 MG/0.5ML SC SOPN
0.7500 mg | PEN_INJECTOR | SUBCUTANEOUS | 0 refills | Status: DC
Start: 2021-12-23 — End: 2022-01-23

## 2021-12-23 NOTE — Telephone Encounter (Signed)
Person calling on behalf of patient: Pharmacy  Sara Snyder is a 66 year old female      - medication(s) being requested:    Clonazepam 09/24/21- 11/23/22    Last [facility] Office Visit: 09/24/21 with Sammuel Cooper  Last Physical Exam: 06/13/20    Other Med Adult:  Most Recent BP Reading(s)  12/22/21 : 127/79        Cholesterol (mg/dL)   Date Value   72/18/2883 215     LOW DENSITY LIPOPROTEIN DIRECT (mg/dL)   Date Value   37/44/5146 153     HIGH DENSITY LIPOPROTEIN (mg/dL)   Date Value   04/79/9872 44     TRIGLYCERIDES (mg/dL)   Date Value   15/87/2761 148         THYROID SCREEN TSH REFLEX FT4 (uIU/mL)   Date Value   09/29/2021 2.730         TSH (THYROID STIM HORMONE) (uIU/mL)   Date Value   01/14/2021 7.090 (H)       HEMOGLOBIN A1C (%)   Date Value   07/14/2021 6.3 (H)       No results found for: POCA1C      No results found for: INR    SODIUM (mmol/L)   Date Value   10/22/2021 139       POTASSIUM (mmol/L)   Date Value   10/22/2021 4.0           CREATININE (mg/dL)   Date Value   84/85/9276 1.2       Documented patient preferred pharmacies:    [facility] [place], [place] - [address]  Phone: [phone] Fax: [phone]

## 2021-12-24 ENCOUNTER — Ambulatory Visit (HOSPITAL_BASED_OUTPATIENT_CLINIC_OR_DEPARTMENT_OTHER)
Admit: 2021-12-24 | Discharge: 2021-12-24 | Disposition: A | Discharge status: Home / Self Care | Payer: No Typology Code available for payment source

## 2021-12-24 ENCOUNTER — Ambulatory Visit: Payer: No Typology Code available for payment source | Attending: Internal Medicine | Admitting: Internal Medicine

## 2021-12-24 ENCOUNTER — Other Ambulatory Visit: Payer: Self-pay

## 2021-12-24 ENCOUNTER — Ambulatory Visit
Admission: RE | Admit: 2021-12-24 | Discharge: 2021-12-24 | Disposition: A | Discharge status: Home / Self Care | Payer: No Typology Code available for payment source | Attending: Internal Medicine | Admitting: Internal Medicine

## 2021-12-24 VITALS — BP 153/80 | HR 72 | Ht 59.5 in | Wt 198.0 lb

## 2021-12-24 DIAGNOSIS — I10 Essential (primary) hypertension: Secondary | ICD-10-CM | POA: Diagnosis present

## 2021-12-24 DIAGNOSIS — R768 Other specified abnormal immunological findings in serum: Secondary | ICD-10-CM

## 2021-12-24 DIAGNOSIS — K219 Gastro-esophageal reflux disease without esophagitis: Secondary | ICD-10-CM | POA: Insufficient documentation

## 2021-12-24 DIAGNOSIS — M19042 Primary osteoarthritis, left hand: Secondary | ICD-10-CM | POA: Diagnosis not present

## 2021-12-24 DIAGNOSIS — R0602 Shortness of breath: Secondary | ICD-10-CM

## 2021-12-24 DIAGNOSIS — R7303 Prediabetes: Secondary | ICD-10-CM | POA: Diagnosis present

## 2021-12-24 DIAGNOSIS — M2550 Pain in unspecified joint: Secondary | ICD-10-CM

## 2021-12-24 DIAGNOSIS — R7681 Abnormal rheumatoid factor and anti-citrullinated protein antibody without rheumatoid arthritis: Secondary | ICD-10-CM

## 2021-12-24 DIAGNOSIS — M8580 Other specified disorders of bone density and structure, unspecified site: Secondary | ICD-10-CM

## 2021-12-24 DIAGNOSIS — M791 Myalgia, unspecified site: Secondary | ICD-10-CM

## 2021-12-24 DIAGNOSIS — R7989 Other specified abnormal findings of blood chemistry: Secondary | ICD-10-CM

## 2021-12-24 DIAGNOSIS — M19071 Primary osteoarthritis, right ankle and foot: Secondary | ICD-10-CM | POA: Diagnosis not present

## 2021-12-24 DIAGNOSIS — M25841 Other specified joint disorders, right hand: Secondary | ICD-10-CM | POA: Diagnosis not present

## 2021-12-24 DIAGNOSIS — M47816 Spondylosis without myelopathy or radiculopathy, lumbar region: Secondary | ICD-10-CM | POA: Diagnosis not present

## 2021-12-24 DIAGNOSIS — M2012 Hallux valgus (acquired), left foot: Secondary | ICD-10-CM | POA: Diagnosis not present

## 2021-12-24 DIAGNOSIS — R040 Epistaxis: Secondary | ICD-10-CM | POA: Insufficient documentation

## 2021-12-24 LAB — RBC SEDIMENTATION RATE: RBC SEDIMENTATION RATE: 35 MM/HR — ABNORMAL HIGH (ref 0–30)

## 2021-12-24 MED ORDER — CALCIUM CITRATE-VITAMIN D 315-5 MG-MCG PO TABS: 1.0000 | ORAL_TABLET | Freq: Two times a day (BID) | ORAL | 3 refills | Status: DC

## 2021-12-24 MED FILL — TRULICITY INJ 0.75/0.5: 28 days supply | Qty: 4 | Fill #0 | Status: CP

## 2021-12-24 MED FILL — CALCIUM CIT/ VIT D 315-200: 90 days supply | Qty: 180 | Fill #0

## 2021-12-24 NOTE — Progress Notes (Signed)
[facility]  Rheumatology New Patient Note    Date of visit: 12/24/2021    Patient: Sara Snyder  Date of birth: Nov 16, 1955  Age: 66 year old  Gender as per [place]  PCP: Darl Pikes, MD  Primary language: [place])    Reason for referral: 10/03/2021: eval for rheumatologic cause of polyarthralgia w/ mildly weak CCP and CRP   - still feeling "severe pain in my bones"   - pain all over the body, when she's walking, her legs and knees bother her   - when she feels the most pain when sitting, she notes more pain in her arms-- when asked specify shoulders/wrists/hands, simply says "yes, all"    Notes reviewed:  12/22/2021: General medicine: Wrist pain, suspected carpal tunnel syndrome, trial wrist brace, arthralgia, mildly elevated CRP, feels frustrated because she cannot be more active due to joint pain.    11/2021: Ophthalmology: Decreased vision, cataracts, suspected glaucoma history of "retinal problem", no gross retinal pathology on examination.    11/2021: ENT: Nasal congestion with nosebleeds-Singulair, and examination midline septum with anterior excoriation on both sides, nasal cautery done for 2 prominent vessels on the inferior septum.  Allergic rhinitis    10/2020: Primary care: Arthralgia multiple locations-back, hips, wrists, elbows, knees, difficulty walking, difficulty with sleep.  CRP elevated.    08/2021: Primary care: "Severe pain in my bones ", pain all over, likely knee pain with walking, pain worse with sitting    HISTORY OF PRESENT ILLNESS:  I had the pleasure of meeting Sara Snyder who is a 66 year old [place] speaking female with osteopenia, low vitamin D, epistaxis, chronic kidney disease, GERD (on PPI), hypothyroidism, bilateral renal artery stenosis, hypertension, androgenic alopecia, recurrent epistaxis, latent tuberculosis who has arthralgias and had 1 value of mildly elevated CRP (26).  On review  of her medical records.  Patient has CCP antibody.    She describes pain in DIPs, PIPs and MCPs and wrists for years, worse over past 2 years.  Can make a fist. No am stiffness.  Pain in am, at times.  She reports swelling of right 2nd MCP and over dorsal, distal forearm (not in wrist). Entire arm pain, bilaterally, worse on proximal, anterior forearm.  Able to elevate arms over head but has proximal right > left upper arm pain when doing so.    Bilateral foot cramping and pain.  Diffuse bilateral leg pain, knee pain and ankle pain. No swelling.  Hard to stand from a sitting position due to pain in knee.  Pain is constant.    Entire back pain- " bones", her daughter says muscular.    Occasional hand numbness.  Has wrist splints.    Acetaminophen 1000mg  is only mildly effective and effect is short-lived.  Can not taken NSAIDs due to renal insufficiency.     She reports depressed and anxious mood.  She has tinnitus, blurry vision, double vision, dentures, tooth loss, DOE, itchiness all over body- worse on head, chest and back.    Epistaxis after blowing her nose. Has allergic rhinitis (occurs all year long) ,  > 4 years. Does not usually occur at home. Has been cauterized. Does not have daily discolored nasal drainage, hearing loss, no SOB at rest or rash.    She currently takes venlafaxine.    History obtained by:  [facility] medical record, daughter and patient.    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    SOCIAL HISTORY:  Quit tobacco at  age 66  No etoh  [place]  Not working  Lives with husband, daughter, son in Social worker and grandson    GYNECOLOGIC HISTORY:  G7P5 (4 daughters and 1 son)  2 miscarriage    FAMILY HISTORY:    Denies known family history of the following:  Systemic lupus erythematosus  Mixed connective tissue disorder  Rheumatoid arthritis  Gout  Psoriasis  Psoriatic arthritis  Ankylosing spondylitis  Thyroid disease  Osteoporosis  Vasculitis  Polymyalgia rheumatica  Sarcoidosis  Sjogren's syndrome    The patient filled out a new patient intake form, to the best of their ability, that includes PMHx, PSHx, family Hx, sexual Hx, gynecological Hx (for women), social history (includes habits, sleep, occupation, and hand dominance), health maintenance questions and a full review of systems.  This form has been scanned into the electronic medical record.  Please refer to it in conjunction with the new patient evaluation office visit note.    PE: [place] interpreter telephone.  Daughter daughter    12/24/21  1005   BP: 153/80   Pulse: 72   SpO2: 97%   Weight: 89.8 kg (198 lb)   Height: 4' 11.5" (1.511 m)     Body mass index is 39.32 kg/m.    GENERAL: NAD.  Alert and oriented. Good eye contact. Full affect. Follows commands appropriately.  Well-groomed.  HEENT: Anicteric.  No scleral injection.  Atraumatic/normocephalic.   LUNGS: Clear to auscultation without wheezes, rhonchi or rales.   SKIN: No rashes, nodules, bruising  MUSCULOSKELETAL: No synovitis appreciated.  Tenderness to palpation of the elbows and upper arms.  Tenderness to palpation in the thighs and in the lower legs.  No synovitis in the feet  NEURO:    No difficulty getting up from seated position without using arms for assistance or out of a supine position.    No assistive ambulatory devices.    LABORATORY DATA that I have personally reviewed and interpreted:  Creatinine 1.2, GFR 49  K4, AST 70, ALT 18  Calcium 9.4  CRP 26 in April (upper limit normal 18)  TSH 2.73  ANA negative in  April  CCP antibody 35 in April (upper limit normal 19)  Rheumatoid factor negative in April  Hepatitis screen negative and 2020  RPR nonreactive in 2021  QuantiFERON + 01/2018  Cholesterol 215, triglycerides 148, HDL 44, LDL 153 in 06/2020  Vitamin D 27 in 06/2021, 19 in 2021  Vitamin B12 08/31/2019   Latest Reference Range & Units 08/22/18 13:23 04/10/19 10:04 07/20/19 08:21 06/13/20 10:28 07/14/21 10:26   HEMOGLOBIN A1C 4.0 - 5.6 % 5.8 (H) 6.0 (H) 5.9 (H) 6.2 (H) 6.3 (H)     IMAGING AND OTHER STUDIES that I personally reviewed and interpreted:    Nuclear perfusion stress test unremarkable 02/2021    Bone density 07/2020: T score lumbar spine-0.3, left, neck-1.8, right femoral neck-1.5, left radius/1.5    ASSESSMENT:  Sara Snyder is a 66 year old [place] speaking female with  osteopenia, GERD, hypertension (renal artery stenosis), renal insufficiency, latent tuberculosis, osteopenia, low vitamin D and carpal tunnel syndrome who has myalgia and arthralgia.  She was found to have low value CRP elevation.  She also has CCP antibody.  CCP antibody can be seen with rheumatoid arthritis and an destructive seronegative spondyloarthropathies.  Although she is CCP antibody, history and physical examination are more suggestive of fibromyalgia syndrome, especially with longstanding nature.    PLAN/RECOMMENDATIONS:  Check additional labs.    Hand, foot and chest x-rays.    Recommend cock-up wrist splints for symptoms suggestive of bilateral carpal tunnel syndrome.    May consider a trial of prednisone.  I am hesitant to do so at this time given her GERD, hypertension and prediabetes.  I will await further data.  I will see her back in a week to review.    Osteopenia taking PPI: start calcium twice daily.  Continue vitamin D.  Encouraged exercise, as tolerated.    Epistaxis: epistaxis only with blowing her nose.  Her epistaxis does not sound consistent with ANCA associated vasculitis.  Does not have other symptoms.   Would not recommend checking ANCA.      The patient was given an end of visit summary upon checkout that included an updated medication list.      The patient's questions have been addressed and answered. The patient verbalized an understanding and agreement with this plan, as outlined above.    Follow-up in 1 week, earlier if needed.    The majority of this rheumatology note was created using voice recognition software.  There is potential for wrong word or sound like substitutions due to the inherent limitations of voice recognition software.  Please read the chart carefully and recognize, using context, where substitutions have occurred.  Please excuse any identified errors in spelling or syntax.  Every effort has been made to appropriately correct upon completion of the note.        Thank you for your kind referral.  I appreciate the opportunity to participate in [facility] care.  Please contact me with any questions that you may have.    I spent a total of 70 minutes on this visit on the date of service (total time includes all activities performed on the date of service)           Rosalita Chessman L. Lenor Derrick, MD, MPH, [facility]

## 2021-12-24 NOTE — Patient Instructions (Signed)
Calcium 600mg  2x daily- carbonate with food or citrate (doesn't matter if take with food).

## 2021-12-25 ENCOUNTER — Telehealth (HOSPITAL_BASED_OUTPATIENT_CLINIC_OR_DEPARTMENT_OTHER): Payer: Self-pay

## 2021-12-25 DIAGNOSIS — R7303 Prediabetes: Secondary | ICD-10-CM | POA: Insufficient documentation

## 2021-12-25 DIAGNOSIS — Z01818 Encounter for other preprocedural examination: Secondary | ICD-10-CM | POA: Insufficient documentation

## 2021-12-25 DIAGNOSIS — Z124 Encounter for screening for malignant neoplasm of cervix: Secondary | ICD-10-CM | POA: Insufficient documentation

## 2021-12-25 DIAGNOSIS — Z0000 Encounter for general adult medical examination without abnormal findings: Secondary | ICD-10-CM | POA: Insufficient documentation

## 2021-12-25 LAB — HIV ANTIGEN ANTIBODY 5TH GEN: HIVAGAB QUALITATIVE: NONREACTIVE

## 2021-12-25 LAB — TREPONEMA PALLIDUM AB IGG: TREPONEMA PALLIDUM AB IGG: NONREACTIVE

## 2021-12-25 MED ORDER — AMLODIPINE BESYLATE-VALSARTAN 5-320 MG PO TABS
1.0000 | ORAL_TABLET | Freq: Every day | ORAL | 11 refills | Status: DC
Start: 2021-12-25 — End: 2022-05-11

## 2021-12-25 MED FILL — AMLOD/VALSAR 5-320MG: 90 days supply | Qty: 90 | Fill #0

## 2021-12-25 NOTE — Assessment & Plan Note (Addendum)
HPI:  -Last mammo 07/2020, birads 2, q2y due 07/2022  -Last FIT 11/2017, due   -No HIV screen on  -Hep c negative 03/2019  -Due for zoster and COVID booster    Plan:  -Colo guard ordered  -HIV screen next time getting labs  -Discuss vaccines next visit

## 2021-12-25 NOTE — Assessment & Plan Note (Signed)
Preoperative cardiovascular risk assessment  This ASA 2 patient has HTN, RCRI score 0 (0.4% risk of major cardiac event), Gupta Perioperative Cardiac Risk 0.1%. NSQIP Risk Calculator w 2% risk of serious complication, and 0% risk of cardiac complication. Functional capacity by history not capable of METS>4 due to MSK pain and he is undergoing a low risk surgery.     Has baseline dyspnea on exertion that is thought to be noncardiac in nature.  Her functional capacity and dyspnea is stable since negative nuclear stress test 02/2021.  Given low risk scores and stable symptoms since recent negative stress test, no further work-up is required.    Medically optimized for cataract surgery.

## 2021-12-25 NOTE — Assessment & Plan Note (Addendum)
HPI:  -BMI 39  -Feels frustrated she can't be more active because of the arthralgias (OV scheduled for 7/5 with rheum)   -Has not scheduled visit with nutritionist yet  -Feels shortness of breath is related to her weight and deconditioning    Assessment:  Discussed continued need to work on diet changes and increase in physical activity.  Discussed medication options today.  Given diagnosis of hypertension, is not a candidate for phentermine/topiramate combo.  She does not have any history of pancreatitis or gallbladder disease or personal or family history of thyroid cancer, so she is eligible for a GLP-1 agonist.  She is amenable to an injectable medication and would like to start.  Discussed importance of concomitantly working on diet and exercise, and risk of regaining weight with cessation of medication.  Given insurance, unable to prescribe semaglutide. Will prescribe weekly Trulicity as opposed to Victoza to avoid daily injection,    Plan:  -Continue to encourage diet changes and increasing physical activity  -Encouraged her to schedule nutrition visit  -Start Trulicity 0.75 mg weekly, increase to 1.5 mg in 4 weeks if tolerating  -Educated about GI side effects  -1 month follow-up

## 2021-12-25 NOTE — Assessment & Plan Note (Signed)
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  Discussed with her that wearing the brace will be essential in improving her symptoms and that if this brace does not fit well she should try different brace.  We will continue avoiding NSAIDs given CKD.    Plan:  -Continue to encourage brace use  -Follow-up with rheumatology appointment  -If symptoms not improving, consider Ortho referral for steroid injection

## 2021-12-25 NOTE — Assessment & Plan Note (Addendum)
HPI:  -New microalbuminuria last visit    Plan:  -Repeat microalbuminuria today and consider adjustment in ARB or initiation of SGLT2 if worsened or continues to be elevated  -Repeat BMP November

## 2021-12-25 NOTE — Telephone Encounter (Signed)
Hi, can you please call Malarie regarding the following:    -Protein level in your urine was back to normal.  We will continue to monitor this but nothing to do for now.  -Can you help her schedule a nursing visit for Trulicity injection teaching?     Please let me know if she has questions.  Thanks!

## 2021-12-25 NOTE — Assessment & Plan Note (Signed)
HPI:  -Continues to be bothered by joint pain bilaterally and knees and upper extremities    Assessment:  As rheumatology consult in a few days.    Plan:  -Follow-up rheumatology office visit  -Continue Tylenol as needed

## 2021-12-25 NOTE — Assessment & Plan Note (Addendum)
BP well controlled today in clinic.  Elevated microalbumin last visit.  We will repeat today    Plan:  -Continue chlorthalidone 25mg  daily, amlodipine/valsartan 5/320 daily, atenolol 100 mg daily  -Repeat microalbumin  -Repeat BMP in November

## 2021-12-25 NOTE — Assessment & Plan Note (Signed)
HPI:  -Stable, no changes    Assessment:  Stable dyspnea on exertion with negative stress test.  Likely noncardiac in etiology, could be related to deconditioning.  No other respiratory symptoms, lungs clear to auscultation on exam, less likely pulmonary etiology.  No signs of heart failure on exam.    Plan:  -Continue to monitor and encourage weight loss

## 2021-12-25 NOTE — Telephone Encounter (Signed)
Due to the language barrier, the phone call was conducted in [place] with an interpreter. The interpreter's name is [place]. The interpreter was on the phone during the entire phone call.    TC with pt daughter. Relayed results & rec. She states that she would be the one administering the Trulicity & that she has had to inject medication before in the past from a similar device so she is very comfortable with it.     Declines nurse visit to practice, but we reviewed the steps and basic education over the phone. She already picked up the medication yesterday & will begin administering it now that she feels even more comfortable with the instructions.     No further questions or concerns at this time.     Georgena Spurling, RN, 12/25/2021

## 2021-12-25 NOTE — Assessment & Plan Note (Signed)
-  Last pap 03/2019 NIL, HPV negative  -Reports all normal/negative paps in the past ([facility] in [place])  -Reports last pap prior to 03/2019 was ~4 years prior      Plan:  -Given age over 23 and 2 negative Paps with cotesting in past 10 years okay to discontinue further cervical cancer screening

## 2021-12-25 NOTE — Assessment & Plan Note (Signed)
HPI:  -Not fully discussed today  -Briefly at the end of the visit mentioned she is concerned about long-term side effects of omeprazole  -Reports she has symptoms if she stops omeprazole    Assessment:  GERD currently well controlled with PPI but unable to discontinue.  No prior EGD.  Will discuss symptoms further next visit and consider EGD.    Plan:  -Continue PPI  -Readdress symptoms next visit  -Consider EGD in the future

## 2021-12-25 NOTE — Assessment & Plan Note (Signed)
Lipids last checked 10/2021 with ASCVD greater than 15%.  Discussed risks benefits of statin and she is amenable to starting.  Plan:  -Start atorvastatin 20 mg daily

## 2021-12-26 MED FILL — CLONAZEPAM 1MG: 30 days supply | Qty: 60 | Fill #0

## 2021-12-28 ENCOUNTER — Telehealth (HOSPITAL_BASED_OUTPATIENT_CLINIC_OR_DEPARTMENT_OTHER): Payer: Self-pay | Admitting: Internal Medicine

## 2021-12-28 NOTE — Telephone Encounter (Signed)
Please find out if she has ever been treated for schistosomiasis (parasite that can stay in your body even without symptoms).  If not, I will send in an rx.

## 2021-12-29 ENCOUNTER — Ambulatory Visit: Payer: No Typology Code available for payment source | Attending: Internal Medicine | Admitting: Internal Medicine

## 2021-12-29 ENCOUNTER — Other Ambulatory Visit: Payer: Self-pay

## 2021-12-29 ENCOUNTER — Encounter (HOSPITAL_BASED_OUTPATIENT_CLINIC_OR_DEPARTMENT_OTHER): Payer: Self-pay | Admitting: Internal Medicine

## 2021-12-29 VITALS — BP 138/77 | HR 75 | Temp 95.5°F | Ht 58.27 in | Wt 197.0 lb

## 2021-12-29 DIAGNOSIS — R7989 Other specified abnormal findings of blood chemistry: Secondary | ICD-10-CM | POA: Insufficient documentation

## 2021-12-29 DIAGNOSIS — N1831 Chronic kidney disease, stage 3a: Secondary | ICD-10-CM | POA: Diagnosis present

## 2021-12-29 DIAGNOSIS — B659 Schistosomiasis, unspecified: Secondary | ICD-10-CM | POA: Diagnosis present

## 2021-12-29 DIAGNOSIS — K219 Gastro-esophageal reflux disease without esophagitis: Secondary | ICD-10-CM | POA: Diagnosis present

## 2021-12-29 DIAGNOSIS — R7303 Prediabetes: Secondary | ICD-10-CM | POA: Insufficient documentation

## 2021-12-29 DIAGNOSIS — I10 Essential (primary) hypertension: Secondary | ICD-10-CM | POA: Diagnosis present

## 2021-12-29 DIAGNOSIS — R768 Other specified abnormal immunological findings in serum: Secondary | ICD-10-CM | POA: Diagnosis present

## 2021-12-29 DIAGNOSIS — M8580 Other specified disorders of bone density and structure, unspecified site: Secondary | ICD-10-CM | POA: Insufficient documentation

## 2021-12-29 DIAGNOSIS — M545 Low back pain, unspecified: Secondary | ICD-10-CM | POA: Insufficient documentation

## 2021-12-29 DIAGNOSIS — R7681 Abnormal rheumatoid factor and anti-citrullinated protein antibody without rheumatoid arthritis: Secondary | ICD-10-CM

## 2021-12-29 DIAGNOSIS — M791 Myalgia, unspecified site: Secondary | ICD-10-CM | POA: Insufficient documentation

## 2021-12-29 MED ORDER — DICLOFENAC SODIUM 1 % EX GEL
2.00 g | Freq: Four times a day (QID) | CUTANEOUS | 3 refills | Status: AC | PRN
Start: 2021-12-29 — End: 2022-12-29

## 2021-12-29 MED ORDER — PRAZIQUANTEL 600 MG PO TABS
40.00 mg/kg | ORAL_TABLET | Freq: Every day | ORAL | 0 refills | Status: AC
Start: 2021-12-29 — End: 2021-12-30

## 2021-12-29 MED FILL — BILTRICIDE  600MG: 1 days supply | Qty: 6 | Fill #0

## 2021-12-29 MED FILL — DICLOFENAC GEL 1%: 37 days supply | Qty: 300 | Fill #0

## 2021-12-29 NOTE — Progress Notes (Signed)
[facility]  Rheumatology Follow-up Patient Note    Date of visit: 12/29/2021    Patient: Sara Snyder  Date of birth: 06-Jun-1956  Age: 66 year old  Gender as per electronic MEDICAL RECORD NUMBERfemale  PCP: Janna Arch, MD  Primary language: [place])  Last visit: 12/24/2021, first and last visit    HPI:  This is a follow-up visit for this 66 year old woman with osteopenia, GERD, hypertension (renal artery stenosis), renal insufficiency, latent tuberculosis, osteopenia, low vitamin D, GERD on PPI and carpal tunnel syndrome who has chronic myalgia and arthralgia.  She was found to have low value CRP elevation (now borderline).  She also has a weakly positive CCP antibody value of 35 with upper limit of normal 19.  History and physical examination were more suggestive of fibromyalgia syndrome and then rheumatoid arthritis.    X-rays consistent with hand, foot and lumbar degenerative changes.    Symptoms suggestive Of carpal tunnel syndrome.  Recommended cock-up wrist splints.    INTERIM HISTORY:  Started calcium.  For years, intermittent dizziness.        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    SOCIAL HISTORY:  Quit tobacco at age 29  No etoh  [place]  Not working  Lives with husband, daughter, son in Social worker and grandson  4 daughters and 1 son  [place] farm    PE: telephone interpreter  Comes in with her daughter  today    12/29/21  0810   BP: 138/77   Pulse: 75   Temp: 95.5 F (35.3 C)   TempSrc: Temporal   SpO2: 95%   Weight: 89.4 kg (197 lb)   Height: 4' 10.27" (1.48 m)     Body mass index is 40.8 kg/m.    GENERAL: NAD.  Alert and oriented. Good eye contact. Full affect. Follows commands appropriately.  Well-groomed.  HEENT: Anicteric.  No scleral injection.     SKIN: No rashes, nodules, bruising   MUSCULOSKELETAL: no synovitis. No swelling.   NEURO:    Needs assistance to get up from supine position    LABORATORY DATA that I personally reviewed today:  Component      Latest Ref Rng & Units 12/24/2021   HIVAGAB QUALITATIVE      NONREACTIVE NON-REACTIVE   HIV-1 ANTIBODY 5TH GEN      0.00 - 0.99 INDEX TNP   HIV-2 ANTIBODY 5TH GEN      0.00 - 0.99 INDEX TNP   HIV-1 ANTIGEN 5TH GEN      0.00 - 0.99 INDEX TNP   HIV-1 ANTIBODY GEENIUS       TNP   HIV-2 ANTIBODY GEENIUS       TNP   HIV RESULT INTERPRETATION       TNP   PHOSPHORUS      2.5 - 4.9 mg/dL 3.1   MAGNESIUM  1.6 - 2.6 mg/dL 1.7   C-REACTIVE PROTEIN      0 - 18 mg/L 20 (H)   RBC SEDIMENTATION RATE      0 - 30 MM/HR 35 (H)   VITAMIN D,25 HYDROXY      30.0 - 100.0 ng/mL 43   CREATINE KINASE TOTAL      26 - 192 U/L 106   HEPATITIS B CORE ANTIBODY      NONREACTIVE NON-REACTIVE   HEPATITIS B SURFACE AB QUANT      0.0 - 8.4 mIU/mL < 3.50   HEPATITIS B SURFACE ANTIGEN      NONREACTIVE NON-REACTIVE   HEPATITIS C ANTIBODY      NONREACTIVE NON-REACTIVE   SCHISTOSOMAL AB IgG      0.00 - 8.99 INDEX 40.90 (H)   TREPONEMA PALLIDUM AB IgG      NONREACTIVE NON-REACTIVE     ASSESSMENT:  Sara Snyder is a 66 year old [place] speaking female with weakly positive CCP whose history, PE and x-rays are not consistent with RA. Exam and chronic pain consistent with diffuse pain syndrome/fibromyalgia.  Chrponic low back pain.  Schistosomiasis ab+.    PLAN/RECOMMENDATIONS:  Schistosomiasis- treat today. Reviewed potential risk pf PZA- headache, rash, HI upset,  dizziness.    Osteopenia/low vit D-  Calcium, take with food and vit d,  Encouraged exercise, as tolerated.    OA: recommend acetaminophen, try to avoid NSAID given HTN, CKD and prediabetes, and topical agents.    Diffuse pain syndrome: Recommend mindfulness practices.  Potential can consider pharmacologic agents such as duloxetine, gabapentin or pregabalin.    CCP- weakly positive. Exam, history and x-rays are not consistent with RA. Likely false positive.  If she develops significant swelling and stiffness in the joints, we can reassess.    LBP- referred to PT.    The patient was given an updated medication list as part of the end of visit summary.     The patient's and her daughter'squestions have been addressed and answered.  The patient verbalized an understanding and agreement with this plan.    The majority of this rheumatology note was created using voice recognition software.  There is potential for wrong word or sound like substitutions due to the inherent limitations of voice recognition software.  Please read the chart carefully and recognize, using context, where substitutions have occurred.  Please excuse any identified errors in spelling or syntax.  Every effort has been made to appropriately correct upon completion of the note.

## 2021-12-29 NOTE — Telephone Encounter (Signed)
Pt seen today at the office with Dr. Lenor Derrick.

## 2021-12-29 NOTE — Patient Instructions (Signed)
- Fibromyalgia is common and causes widespread pain and tenderness (sensitivity to touch). The pain and tenderness tend to come and go, and move about the body. Most often, people with this chronic (long-term) illness are fatigued (very tired) and have sleep problems.    - Fibromyalgia syndrome is a chronic pain syndrome in which pain signals are increased presenting with joint pain, muscle aches, tenderness and anxiety.  It is not in autoimmune condition or arthritis and does not need immunosuppressive/immunomodulatory therapies.   - Fibromyalgia is more common (2-4% of the population) in women, although it can occur in men. It most often starts in middle adulthood, but can occur in the teen years and in older age.   - You are at higher risk for fibromyalgia if you have a rheumatic disease (health problem that affects the joints, muscles and bones).   -Fibromyalgia syndrome is often seen with depression, anxiety, PTSD, migraine, tension headaches, digestive problems (irritable bowel syndrome, gastroesophageal reflux disease/GERD), irritable or overactive bladder, pelvic pain, temporomandibular joint (TMJ) disorder  - Treatment approach is multidisciplinary and includes pharmacologic and nonpharmacologic therapies.  The goal of therapy is to aid in making pain more manageable and to improve coping.  It is very uncommon for pain to resolve.   - Mindfulness practices including tai chi, meditation, yoga and deep breathing exercises are encouraged.   These modalities are considered first-line therapies along with routine aerobic exercise.    - Biofeedback and cognitive behavioral therapy can be beneficial.  Acupuncture may be effective.    - We do not recommend narcotics for fibromyalgia syndrome as they have not been shown to be effective and can increase central pain sensitization and pain levels.    - NSAIDs are usually not effective.    - Addressing and treating both sleep disorders and mood disorders is  fundamental.  Adequate sleep is crucial.  Improving sleep quality is important, including using the bedroom for sleep and sex only; avoiding the use of computers and smart phones and watching television late at night; decreasing caffeine, alcohol and nicotine intake; Avoid napping during the day. In individuals who are overweight or in those who snore, am evaluation for sleep apnea may be warranted.  - Pharmacological agents include gabapentin and pregabalin (GABA directed agents), tricyclic antidepressants, muscle relaxants and SNRI's (duloxetine, milnacipran and venlafaxine).  - Often, a rheumatologist detects this disease (and rules out rheumatic diseases). For long-term care, you do not need to follow with a rheumatologist. Your primary care physician can provide all the other care and treatment of fibromyalgia that you need.     Some apps for smartphones you can consider (I found these recommended through online articles):  Insight Timer  Calm  10% Happier  Headspace  Sleep time  Smiling Mind  Breathe  Personal Zen  Relax @ Rest Guided Meditations  Stop, Breath, and Think  Root  Acupressure  Stop panic & Anxiety Self-Help  Relax & Rest Guided Meditations ($1.99)  Colorfy (coloring app can help with stress)  Dare- Break Free From Anxiety  [employer] Relax and Sleep  Shine: calm anxiety & stress  [facility] Mindful: Meditations for Well-Being  Breathe2Relax  -------------------------------------------------------------------------------------------  Mindfulness Training at [employer]:  Participate in a mindfulness training program offered   at a [facility] near you.     An 8-week mindfulness-based group program for [facility] patients.   Groups take place in the evenings at [facility] primary care sites.   Each group session is  2 hours long.   Each group is led by a Advertising copywriter.   This group is based on the Mindfulness-Based Stress Reduction program.   This group is designed for patients with: depression, anxiety,  insomnia, stress, chronic pain and anyone living with a chronic illness.   This group is designed to enhance your ability to manage living with chronic illness. You will learn how to reduce stress and anxiety and improve your health and mood.   Many people feel happier and more at ease with themselves after the training.   Sessions include instruction in mindfulness practices and gentle mindful movement. Sessions also include group discussion and small group exercises. This group offers support and skill-building.   All participants must participate in a research study. This involves completing three sessions of surveys over a 6 month period. These surveys ask about your health and your experience with mindfulness. Each survey session takes less than 60 minutes.   All personal information will be kept confidential. This is a program with limited space. A referral from your primary care provider is required. Talk to your Physician Assistant, Nurse Practitioner, or Primary Care Doctor today to see if this is right for you. Ask your primary care team for a referral.     If you are interested, or for more information, please contact:   [email or web address] or by phone at [phone]   ------------------------------------------------------------  Yoga is offered at [facility]:  NotSimilar.no  -------------------------------------------------------------  [facility] [place].    --------------------------------------------------------------------------------  - I encourage learning more about fibromyalgia from websites and to consider joining support groups.   Fibromyalgia Network: Administrator.fmnetnews.[place] of Arthritis and Musculoskeletal and Skin Diseases: www.niams.DiningDoc.be.htm   National Fibromyalgia Association: www.fmaware.org   National Fibromyalgia and  Chronic Pain Association: www.fmcpaware.[place].: www.HotelLives.no   Arthritis Foundation: www.arthritis.org  The [email or web address]

## 2022-01-01 LAB — PHOSPHORUS MAGNESIUM
MAGNESIUM: 1.7 mg/dL (ref 1.6–2.6)
PHOSPHORUS: 3.1 mg/dL (ref 2.5–4.9)

## 2022-01-01 LAB — VITAMIN D,25 HYDROXY: VITAMIN D,25 HYDROXY: 43 ng/mL (ref 30.0–100.0)

## 2022-01-01 LAB — HEPATITIS C ANTIBODY: HEPATITIS C ANTIBODY: NONREACTIVE

## 2022-01-01 LAB — HEPATITIS B SURFACE AB QUANT: HEPATITIS B SURFACE AB QUANT: <3.5 m[IU]/mL (ref 0.0–8.4)

## 2022-01-01 LAB — C-REACTIVE PROTEIN: C-REACTIVE PROTEIN: 20 mg/L — ABNORMAL HIGH (ref 0–18)

## 2022-01-01 LAB — SCHISTOSOMAL AB IGG: SCHISTOSOMAL AB IgG: 40.9 INDEX — ABNORMAL HIGH (ref 0.00–8.99)

## 2022-01-01 LAB — HEPATITIS B CORE ANTIBODY: HEPATITIS B CORE ANTIBODY: NONREACTIVE

## 2022-01-01 LAB — HEPATITIS B SURFACE ANTIGEN: HEPATITIS B SURFACE ANTIGEN: NONREACTIVE

## 2022-01-01 LAB — STRONGYLOIDES AB IGG: STRONGYLOIDES AB IgG: 1.54 INDEX (ref 0.00–9.99)

## 2022-01-01 LAB — CREATINE KINASE TOTAL: CREATINE KINASE TOTAL: 106 U/L (ref 26–192)

## 2022-01-06 ENCOUNTER — Other Ambulatory Visit: Payer: Self-pay

## 2022-01-06 ENCOUNTER — Ambulatory Visit: Payer: No Typology Code available for payment source | Attending: Ophthalmology

## 2022-01-06 DIAGNOSIS — H25813 Combined forms of age-related cataract, bilateral: Secondary | ICD-10-CM | POA: Diagnosis present

## 2022-01-06 MED FILL — LEVOTHYROXIN 50MCG: 90 days supply | Qty: 90 | Fill #1 | Status: CP

## 2022-01-06 MED FILL — AMLOD/VALSAR 5-320MG: 90 days supply | Qty: 90 | Fill #1 | Status: CP

## 2022-01-06 NOTE — Progress Notes (Signed)
Procedure performed by technician under supervision of attending MD.

## 2022-01-13 ENCOUNTER — Encounter (HOSPITAL_BASED_OUTPATIENT_CLINIC_OR_DEPARTMENT_OTHER): Payer: Self-pay

## 2022-01-13 DIAGNOSIS — F330 Major depressive disorder, recurrent, mild: Secondary | ICD-10-CM

## 2022-01-14 ENCOUNTER — Encounter (HOSPITAL_BASED_OUTPATIENT_CLINIC_OR_DEPARTMENT_OTHER): Payer: Self-pay

## 2022-01-14 ENCOUNTER — Ambulatory Visit: Payer: No Typology Code available for payment source | Attending: Internal Medicine

## 2022-01-14 ENCOUNTER — Other Ambulatory Visit: Payer: Self-pay

## 2022-01-14 VITALS — BP 150/78 | HR 72 | Temp 98.0°F | Resp 22 | Ht 58.27 in | Wt 197.0 lb

## 2022-01-14 DIAGNOSIS — I10 Essential (primary) hypertension: Secondary | ICD-10-CM | POA: Insufficient documentation

## 2022-01-14 DIAGNOSIS — Z01818 Encounter for other preprocedural examination: Secondary | ICD-10-CM | POA: Diagnosis present

## 2022-01-14 MED ORDER — VENLAFAXINE HCL ER 150 MG PO CP24
ORAL_CAPSULE | ORAL | 3 refills | Status: DC
Start: 2022-01-14 — End: 2022-02-11

## 2022-01-14 MED FILL — VENLAFAXINE 150MG ER: 30 days supply | Qty: 30 | Fill #1 | Status: CP

## 2022-01-14 NOTE — Progress Notes (Signed)
Interpreter: A [place] interpreter was used today.    SUBJECTIVE  Chief Complaint:  pre-op physical    History of Present Illness: Patient Sara Snyder is a 66 year old female with PMHx of below who presents with her daughter for pre-op physical.    # pre-op physical  - Surgery: cataract R   - Anesthesia: local  - Indication: cataract  - Approx date: 02/03/2022  - Location: [facility]   - Surgeon: Dr. Lonia Chimera  - H/o excessive/abnormal bleeding: denies  -  Level of physical activity (do ADLs w/o assist; walk indoors around the house; do light house work; climb a flight of stairs, run a short distance; participate in strenuous sports): able to do ADL's w/o assist and walk indoors. Patient's physical activity is limited by knee and back pain which is chronic, also w/ baseline dyspnea on exertion which is unchanged since normal NM stress test in 02/2021 and pt denies any worsening of symptoms or any chest pain at all. Doe thought non-cardiac in nature, thought d/t weight and deconditioning.     - Surgical history:  Past Surgical History:  10/2016: HEMORRHOID SURGERY  No date: OB ANTEPARTUM CARE CESAREAN DLVR & POSTPARTUM  - Smoking: 10 years, 53  - EtOH: denies   - Illicit Drugs: denies   - Allergies:   Review of Patient's Allergies indicates:   Pollen extract          Cough     - Reports taking prescription medications as directed.   - Denies any OTC medications, vitamins, or supplements other than listed in med list.     - BP elevated on rooming today, improved on recheck though still elevated. No symptoms. She regularly checks her BP at home and it is consistently at goal at home. Today, she checked her bp prior to taking her medications and it was 130/60.     Most Recent BP Reading(s)  01/14/22 : (!) 150/78  12/29/21 : 138/77  12/24/21 : 153/80  12/22/21 : 127/79  11/06/21 : (!) 153/72    Review of Systems: No fevers, chills, chest pain, cough, SOB, palpitations, orthopnea, paroxysmal nocturnal dyspnea,  abdominal pain.     PMH:  Patient Active Problem List:     Essential hypertension     Labyrinthitis     Panic disorder     Hyperlipidemia     Chronic cough     Mild episode of recurrent major depressive disorder (HCC)     Allergic rhinitis     Bilateral arm pain     Stage 3a chronic kidney disease     Anxiety state     Palpitations     Psychophysiological insomnia     Secondary hyperparathyroidism (HCC)     Osteopenia     Carpal tunnel syndrome of right wrist     Polyarthralgia     Hair loss     Vitamin D deficiency     Obesity (BMI 30-39.9)     Bilateral renal artery stenosis (HCC)     DOE (dyspnea on exertion)     Recurrent epistaxis     Gastroesophageal reflux disease without esophagitis     Other specified hypothyroidism     Combined forms of age-related cataract of right eye     Preoperative examination     Cervical cancer screening     Prediabetes     Healthcare maintenance     Schistosomiasis      Past Surgical History:  10/2016: HEMORRHOID SURGERY  No date: OB ANTEPARTUM CARE CESAREAN DLVR & POSTPARTUM    Social History    Tobacco Use      Smoking status: Former        Packs/day: 2.00        Years: 25.00        Pack years: 15        Types: Cigarettes        Quit date: 10/27/1977        Years since quitting: 44.2      Smokeless tobacco: Never    Alcohol use: Not Currently      No family history on file.        Current Outpatient Medications:   .  diclofenac (VOLTAREN) 1 % GEL Gel, Apply 2 g topically 4 (four) times daily as needed, Disp: 300 g, Rfl: 3  .  clonazePAM (KLONOPIN) 1 MG tablet, TAKE 1 TABLET BY MOUTH TWICE DAILY AS NEEDED, Disp: 60 tablet, Rfl: 1  .  amlodipine-valsartan (EXFORGE) 5-320 MG per tablet, Take 1 tablet by mouth in the morning., Disp: 30 tablet, Rfl: 11  .  calcium citrate-citamin D (CALCIUM CITRATE +) 315-5 MG-MCG tablet, Take 1 tablet by mouth in the morning and 1 tablet before bedtime., Disp: 180 tablet, Rfl: 3  .  dulaglutide (TRULICITY) A999333 0000000 injection, Inject 0.75 mg under  the skin once a week, Disp: 2 mL, Rfl: 0  .  atorvastatin (LIPITOR) 20 MG tablet, Take 1 tablet by mouth in the morning., Disp: 90 tablet, Rfl: 3  .  latanoprost (XALATAN) 0.005 % ophthalmic solution, Place 1 drop into both eyes nightly, Disp: 2.5 mL, Rfl: 12  .  atenolol (TENORMIN) 100 MG tablet, Take 1 tablet by mouth nightly, Disp: 90 tablet, Rfl: 1  .  loratadine (CLARITIN) 10 MG tablet, Take 1 tablet by mouth in the morning., Disp: 30 tablet, Rfl: 6  .  meclizine (ANTIVERT) 25 MG TABS, Take 1 tablet by mouth every 8 (eight) hours as needed, Disp: 90 tablet, Rfl: 2  .  chlorthalidone (HYGROTEN) 25 MG tablet, Take 1 tablet by mouth in the morning., Disp: 90 tablet, Rfl: 0  .  Multiple Vitamins-Minerals (HAIR SKIN AND NAILS FORMULA) TABS, Take 1 capsule by mouth in the morning., Disp: 30 tablet, Rfl: 12  .  Elastic Bandages & Supports (RA WRIST BRACE ADJ RIGHT L/XL) MISC, Use as able for carpal tunnel syndrome, Disp: 1 each, Rfl: 0  .  diclofenac (VOLTAREN) 1 % GEL Gel, Apply 8 g topically in the morning and 8 g at noon and 8 g in the evening and 8 g before bedtime., Disp: 100 g, Rfl: 3  .  cholecalciferol (VITAMIN D3) 2000 UNIT tablet, Take 1 tablet by mouth in the morning and 1 tablet before bedtime., Disp: 180 tablet, Rfl: 3  .  montelukast (SINGULAIR) 10 MG tablet, Take 1 tablet by mouth nightly, Disp: 30 tablet, Rfl: 6  .  acetaminophen (TYLENOL) 500 MG tablet, Take 1 tablet by mouth every 4 (four) hours as needed for Pain, Disp: 60 tablet, Rfl: 3  .  omeprazole (PRILOSEC) 20 MG capsule, Take 1 capsule by mouth in the morning., Disp: 90 capsule, Rfl: 0  .  venlafaxine (EFFEXOR-XR) 150 MG 24 hr capsule, 1 capsule daily, Disp: 30 capsule, Rfl: 3  .  levothyroxine (SYNTHROID) 50 MCG tablet, Take 1 tablet by mouth every morning before breakfast, Disp: 90 tablet, Rfl: 3  .  lidocaine (LIDODERM) 5 % patch, Place 1 patch  onto the skin in the morning., Disp: 30 patch, Rfl: 2  .  loratadine (CLARITIN) 10 MG tablet,  Take 1 tablet by mouth daily as needed for Allergies (and nose congestion), Disp: 90 tablet, Rfl: 1    Review of Patient's Allergies indicates:   Pollen extract          Cough      OBJECTIVE  BP (!) 150/78   Pulse 72   Temp 98 F (36.7 C) (Temporal)   Resp 22   Ht 4' 10.27" (1.48 m)   Wt 89.4 kg (197 lb)   SpO2 94%   BMI 40.79 kg/m      01/14/22  0933 01/14/22  0934 01/14/22  1010 01/14/22  1015   BP: (!) 175/77 (!) 176/80 (!) 160/82 (!) 150/78   Site: Left Arm Left Arm     Position: Sitting Sitting     Cuff Size: Large Large     Pulse:       Resp:       Temp:       TempSrc:       SpO2:       Weight:       Height:         Physical Exam:    General: Well-appearing, in NAD.  Skin: Warm, dry, no evident rashes or lesions.   HEENT: NCAT. EOMI, PERRL, conjunctiva clear. Hearing intact to voice. Nasal septum midline, turbinates normal. OP moist, uvula midline, oropharynx clear, dentures in place.   Neck: Full ROM grossly. No anterior cervical, posterior cervical, submandibular, supraclavicular LAD.  CV: RRR, normal S1 and S2, no M/R/G.  Pulm: CTA bilaterally, no wheezes, no rales, no rhonchi. Not using accessory muscles of respiration.   Back: Grossly normal ROM.   Abd: Obese abdomen, soft, non-tender, non-distended.   Extrem: Full ROM of all extremities, no cyanosis, or clubbing.   Neuro: Awake, alert and oriented to person, place, and time. CN II-XII grossly intact. Sensation to light touch intact in all extremities. Normal muscle tone and strength 5/5 in all extremities. Gait and balance normal.   Psych: Euthymic. Normal speech. Normal thought process and content.       ASSESSMENT/PLAN    1. Preoperative examination  Patient here for pre-operative evaluation of cardiac risk prior to low risk non-cardiac surgery. Pt has discussed risks and benefits of surgery w/ their surgeon. Functional capacity by history not capable of METS>4 due to MSK pain. Has baseline dyspnea on exertion that is thought to be noncardiac  in nature d/t deconditioning and obesity.  Her functional capacity and dyspnea is stable since negative nuclear stress test 02/2021. She denies any chest pain. Given low risk scores and stable symptoms since recent negative stress test, no further work-up is required.  CARDIAC RISK STRATIFICATION (per ACC/AHA guidelines 2007)  risk for perioperative cardiac event (RCRI): 0.4% Risk of Major Cardiac Event, Class I Risk   recommendation: No further testing, may proceed to surgery   Based on the 2014 ACC/AHA guidelines, and 2022 ACC NCS Guidelines an EKG is not indicated.   Low risk and may proceed to surgery without further testing.    2. Essential hypertension  BP elevated today on rooming, some improvement on recheck though still elevated. Asymptomatic. Home readings at goal. Ctm at home and here on next visit. Continue current medications. F/u w/ PCP as planned, sooner if home readings elevated.  ED/911 precautions for severe symptoms.     Follow-Up: w/ Ophtho as planned,  w/ PCP as planned     Precautions given, all questions answered.     CC Chart: PCP, Janna Arch, MD    I spent a total of 33 minutes on this visit on the date of service (total time includes all activities performed on the date of service)    Lindalou Hose, PA-C

## 2022-01-14 NOTE — Telephone Encounter (Signed)
[facility], Riddhi Grether is a 66 year old female has requested a refill of     -  VENLAFAXINE (requesting for next fill due in August)       Last Office Visit: 12/22/2021 with PCP  Last Physical Exam: 06/13/2020    There are no preventive care reminders to display for this patient.    Other Med Adult:  Most Recent BP Reading(s)  01/14/22 : (!) 150/78        Cholesterol (mg/dL)   Date Value   79/15/0569 215     LOW DENSITY LIPOPROTEIN DIRECT (mg/dL)   Date Value   79/48/0165 153     HIGH DENSITY LIPOPROTEIN (mg/dL)   Date Value   53/74/8270 44     TRIGLYCERIDES (mg/dL)   Date Value   78/67/5449 148         THYROID SCREEN TSH REFLEX FT4 (uIU/mL)   Date Value   09/29/2021 2.730         TSH (THYROID STIM HORMONE) (uIU/mL)   Date Value   01/14/2021 7.090 (H)       HEMOGLOBIN A1C (%)   Date Value   07/14/2021 6.3 (H)       No results found for: POCA1C      No results found for: INR    SODIUM (mmol/L)   Date Value   10/22/2021 139       POTASSIUM (mmol/L)   Date Value   10/22/2021 4.0           CREATININE (mg/dL)   Date Value   20/03/711 1.2       Documented patient preferred pharmacies:    [facility] [place], [place] - [address]  Phone: [phone] Fax: [phone]

## 2022-01-21 ENCOUNTER — Ambulatory Visit (HOSPITAL_BASED_OUTPATIENT_CLINIC_OR_DEPARTMENT_OTHER): Payer: No Typology Code available for payment source | Admitting: "Psychiatric/Mental Health

## 2022-01-21 ENCOUNTER — Ambulatory Visit: Payer: No Typology Code available for payment source

## 2022-01-21 DIAGNOSIS — E669 Obesity, unspecified: Secondary | ICD-10-CM | POA: Diagnosis present

## 2022-01-21 NOTE — Progress Notes (Unsigned)
INITIAL NUTRITION ASSESSMENT / INTERVENTION    Beginning Time: 11:07 AM  End Time: 12:02 PM     Total Minutes: 51 minutes    -Interpreter lost connection at 11:13 (6 minutes into appointment), took several minutes to reconnect    Home Clinic: [address] / [place] [address]  [phone]    Sara Snyder is a 66 year old female who presents with obesity class III.  Visit via [place] interpreter, [phone], via phone.  The patient is from [place]. Ethnicity: [place].  Patient's language is [place]. Patient able to read [place].       Subjective (including lifestyle changes): Patient reports   -Re: nutrition referral "Maybe they spoke to my daughter about it, she takes me to the clinic, but they did not say that to me."     -It's very hard to lose weight, I just wanted something written, not this appointment    -I was a bit overweight when I got here, but then I gained much more since living here   -Immigrated 5 years ago, weighed 72 kg when she came to [name] "I know that was already overweight"   -"I guess I am eating foods that cause weight gain more than over there"   -"Here, my daughter buys a lot of pastries and cakes, in [place] I would only have a french roll but here it's more."   -"Now that I've reached this weight it gets in my way to walking - I don't feel like I can walk more. I try to walk my hip hurts, my knees and feet hurt, I get cramps on my feet. I feel like I cannot walk very much, like 10 meters, everything gets stuck... the hip, especially the right hip, it seems like it locks."     -Has not tried to change her diet "I live with my daughter and son-in-law, they buy sweet things. I can't change what they're buying, they buy all this sweet stuff and if I don't eat that there's nothing else."   -Daughter does not bring pt w/ her when grocery shopping, pt does "not know how to get around" so she cannot go on her own   -Pt watches grandson while family is grocery shopping    -"Because I don't work I  cannot make a list asking for things if I cannot contribute anything. I would like to work but I cannot because of all the problems I feel."    -Not receiving any benefits for food or finances  -"If I had my own money it would be easier to request my family get things, because I already live here for free. It would feel uncomfortable for me to ask, and I don't know what the answer would be, and I don't want to hear in a way I would not like from them."    -Was taking Trulicity, just ran out of it on Sunday - "I think it was helping a little bit. My daughter said she was going to be requesting more at the clinic"    No family history on file.        Patient has had obesity for 1-5 years and has had Medical Nutrition Therapy before  1-5 years at [facility] [address] [facility].    Current Outpatient Medications   Medication Sig   . venlafaxine (EFFEXOR-XR) 150 MG 24 hr capsule 1 capsule daily   . diclofenac (VOLTAREN) 1 % GEL Gel Apply 2 g topically  4 (four) times daily as needed   . clonazePAM (KLONOPIN) 1 MG tablet TAKE 1 TABLET BY MOUTH TWICE DAILY AS NEEDED   . amlodipine-valsartan (EXFORGE) 5-320 MG per tablet Take 1 tablet by mouth in the morning.   . calcium citrate-citamin D (CALCIUM CITRATE +) 315-5 MG-MCG tablet Take 1 tablet by mouth in the morning and 1 tablet before bedtime.   . dulaglutide (TRULICITY) 0.75 MG/0.5ML injection Inject 0.75 mg under the skin once a week   . atorvastatin (LIPITOR) 20 MG tablet Take 1 tablet by mouth in the morning.   . latanoprost (XALATAN) 0.005 % ophthalmic solution Place 1 drop into both eyes nightly   . atenolol (TENORMIN) 100 MG tablet Take 1 tablet by mouth nightly   . loratadine (CLARITIN) 10 MG tablet Take 1 tablet by mouth in the morning.   . meclizine (ANTIVERT) 25 MG TABS Take 1 tablet by mouth every 8 (eight) hours as needed   . chlorthalidone (HYGROTEN) 25 MG tablet Take 1 tablet by mouth in the morning.   . Multiple Vitamins-Minerals  (HAIR SKIN AND NAILS FORMULA) TABS Take 1 capsule by mouth in the morning.   Clinical research associate Bandages & Supports (RA WRIST BRACE ADJ RIGHT L/XL) MISC Use as able for carpal tunnel syndrome   . diclofenac (VOLTAREN) 1 % GEL Gel Apply 8 g topically in the morning and 8 g at noon and 8 g in the evening and 8 g before bedtime.   . cholecalciferol (VITAMIN D3) 2000 UNIT tablet Take 1 tablet by mouth in the morning and 1 tablet before bedtime.   . montelukast (SINGULAIR) 10 MG tablet Take 1 tablet by mouth nightly   . acetaminophen (TYLENOL) 500 MG tablet Take 1 tablet by mouth every 4 (four) hours as needed for Pain   . omeprazole (PRILOSEC) 20 MG capsule Take 1 capsule by mouth in the morning.   Marland Kitchen levothyroxine (SYNTHROID) 50 MCG tablet Take 1 tablet by mouth every morning before breakfast   . lidocaine (LIDODERM) 5 % patch Place 1 patch onto the skin in the morning.   . loratadine (CLARITIN) 10 MG tablet Take 1 tablet by mouth daily as needed for Allergies (and nose congestion)     No current facility-administered medications for this visit.       Vitamins/Minerals/Herbal Supplements: Vitamin D and Calcium    Social History    Tobacco Use      Smoking status: Former        Packs/day: 2.00        Years: 25.00        Pack years: 50        Types: Cigarettes        Quit date: 10/27/1977        Years since quitting: 44.2      Smokeless tobacco: Never    Alcohol use: Not Currently      Review of Patient's Allergies indicates:   Pollen extract          Cough    Labs reviewed: Yes  (For list of labs type "dot" LLNUTA)  Lab Results   Component Value Date    ALBUMIN 4.2 09/29/2021     No results found for: PREALBUMIN  Lab Results   Component Value Date    HGB 11.4 07/14/2021     Lab Results   Component Value Date    HCT 34.5 07/14/2021     Lab Results   Component Value Date  HGBA1C 6.3 (H) 07/14/2021     No results found for: FERRITIN  No results found for: TRANSFERRIN  Lab Results   Component Value Date    CHOLESTEROL 215 07/14/2021      Lab Results   Component Value Date    LDL 153 07/14/2021     Lab Results   Component Value Date    HDL 44 07/14/2021     Lab Results   Component Value Date    TG 148 06/13/2020     No results found for: INSULIN  No results found for: TESTOSTOTAL  No results found for: GLUCOSEF  Lab Results   Component Value Date    GLUCOSER 103 10/22/2021     No results found for: FINGERSTICKR  No results found for: [facility]  Lab Results   Component Value Date    SPEGRAVURINE 1.020 07/25/2020    SPEGRAVURINE 1.020 06/13/2020     Lab Results   Component Value Date    TSH 7.090 (H) 01/14/2021       Vitals:  Most Recent BP Reading(s)  01/14/22 : (!) 150/78            Most Recent Height Reading(s)  01/14/22 : 4' 10.27" (1.48 m)          Most Recent Weight Reading(s)  01/14/22 : 89.4 kg (197 lb)       Weight change: Gained 38 pounds in the past 5 year(s).    Estimated body mass index is 40.79 kg/m as calculated from the following:    Height as of 01/14/22: 4' 10.27" (1.48 m).    Weight as of 01/14/22: 89.4 kg (197 lb).  BMI Category: >40 extreme obesity, class III    Highest weight: 89 kg       Lowest weight: 72 kg             Physical Activity:  Type: not physically active     Barriers to physical activity include: physical    Activities of Daily Living: Independent    Chewing ability: missing some teeth   Swallowing ability: "I have trouble swallowing something if it's too hard, because my teeth are not complete"   Appetite: Decreased    Food Allergies: No known allergies  Food Intolerances: None       Religious restrictions/Special diet: None  Food purchased by: Daughter       Food prepared by: Daughter  Meal sources: home cooked and convenience foods  Economic factors and food assist: Patient does not have her own money to buy food  Psychosocial factors / Mental Status: interactive with questions  Stress: High  Readiness to learn: Good    Dietary history / usual intake reveals patient follows no restrictions in diet.  She eats  2 meals and 1-2 snacks per day.    Breakfast  -Coffee, milk and cake    Lunch  -Rice, beans, fried egg (sometimes boiled egg, doesn't eat meat)  -"No salad, I don't like salad" - but sometimes has some vegetables; kale, spinach    Snack - 5:30 PM  -[place] cheese bread & coffee "w/ chocolate milk powder for sweetener"    Dinner  -Whatever was leftover from lunch    Juice: Sometimes  Soda: Less than juice  Alcohol: None  Milk: with coffee  Coffee: two cups per day, w/ chocolate milk added     Assessment/Plan/Conclusion:  66 YO F w/ obesity class III, HTN, prediabetes  -Pt would like to change her diet,  however she lives with her daughter & son-in-law. They buy a lot of sweet and snack foods, and do not buy many fresh foods. Pt struggles to avoid the sweet food, feeling that at times she has nothing else to eat. She has no money of her own, she lives for free with her daughter and as such she feels she cannot request certain foods when they go grocery shopping. Pt struggles to get around, has limited movement d/t hip and leg pain.   -Discussed nutrition guidelines for wt loss, prediabetes & HTN. Discussed benefit programs pt may qualify for - pt is interested in applying for a program that would allow her to get some of her own food - more vegetables and meat, so she does not rely on what her family has bought.     Barriers to dietary changes include: financial    Client's goals:   -No goals set at this time     Material provided:Verbal education     Follow-up: 1 months.    Myrtha Tonkovich H Alejo Beamer

## 2022-01-23 ENCOUNTER — Ambulatory Visit: Payer: No Typology Code available for payment source | Admitting: Internal Medicine

## 2022-01-23 ENCOUNTER — Encounter (HOSPITAL_BASED_OUTPATIENT_CLINIC_OR_DEPARTMENT_OTHER): Payer: Self-pay

## 2022-01-23 ENCOUNTER — Other Ambulatory Visit (HOSPITAL_BASED_OUTPATIENT_CLINIC_OR_DEPARTMENT_OTHER): Payer: Self-pay

## 2022-01-23 DIAGNOSIS — E669 Obesity, unspecified: Secondary | ICD-10-CM

## 2022-01-23 DIAGNOSIS — Z719 Counseling, unspecified: Secondary | ICD-10-CM

## 2022-01-23 MED FILL — TRULICITY INJ 0.75/0.5: 28 days supply | Qty: 4 | Fill #0

## 2022-01-23 NOTE — Progress Notes (Signed)
66 y/o F televisit:  Daughter answers, she notes she requested the visit and Mom (pt) is not avail currently    #"Precription increase":  She clarifies needs refill only "trulicity"  Her daughter her Mom was rx'd for weight loss  A1C 6.3 1/23  She recalls they were told "it's a safe medicine"    ROS & Physical exam: Not able to speak with pt, though requested    A&P  #Request for refill on expired med:  Offered refill X 1 mo, daughter kindly declines for now.  Cunseled re use as well as common and possible serious side-effects; they will wait for now on continuing this med to discuss further with PCP.    #F/U with PCP.

## 2022-01-23 NOTE — Telephone Encounter (Signed)
PER Daughter, Sara Snyder is a 66 year old female has requested a refill of-  trulicity       Last Office Visit: 01/14/22 with saunders, d  Last Physical Exam: 06/13/20     There are no preventive care reminders to display for this patient.     Other Med Adult:  Most Recent BP Reading(s)  01/14/22 : (!) 150/78        Cholesterol (mg/dL)   Date Value   23/00/9794 215     LOW DENSITY LIPOPROTEIN DIRECT (mg/dL)   Date Value   99/71/8209 153     HIGH DENSITY LIPOPROTEIN (mg/dL)   Date Value   90/68/9340 44     TRIGLYCERIDES (mg/dL)   Date Value   68/40/3353 148         THYROID SCREEN TSH REFLEX FT4 (uIU/mL)   Date Value   09/29/2021 2.730         TSH (THYROID STIM HORMONE) (uIU/mL)   Date Value   01/14/2021 7.090 (H)       HEMOGLOBIN A1C (%)   Date Value   07/14/2021 6.3 (H)       No results found for: POCA1C      No results found for: INR    SODIUM (mmol/L)   Date Value   10/22/2021 139       POTASSIUM (mmol/L)   Date Value   10/22/2021 4.0           CREATININE (mg/dL)   Date Value   31/74/0992 1.2        Documented patient preferred pharmacies:     [facility] [place] [place] - [address]  Phone: [phone] Fax: [phone]

## 2022-01-26 NOTE — Telephone Encounter (Addendum)
Hi, can you please call Sara Snyder/daughter regarding the following:    The reason for no refills was because we needed to check in about side effects in order to know which dose to do next.    I saw they had a visit with Dr. Brooke Dare and now they have some concerns regarding the medication? I'm happy to try to address those. I actually had a cancellation on my schedule today so they can come in to discuss or we can plan to do a televisit then. Just let me know. Thanks!

## 2022-01-26 NOTE — Telephone Encounter (Signed)
TC to pt   'VM of [name]' - daughter  LM VM to call clinic back at [phone] and ask to speak to a nurse      Dairl Ponder, RN, 01/26/2022 11:52 AM

## 2022-01-27 NOTE — Telephone Encounter (Signed)
Outreach #2  Called pt on assigned phone number  Did not answer  LM on VM instructing to call back when available.  No PHI disclosed    Paulino Rily, RN, 01/27/2022

## 2022-01-28 ENCOUNTER — Other Ambulatory Visit (HOSPITAL_BASED_OUTPATIENT_CLINIC_OR_DEPARTMENT_OTHER): Payer: Self-pay

## 2022-01-28 DIAGNOSIS — R7303 Prediabetes: Secondary | ICD-10-CM

## 2022-01-28 DIAGNOSIS — E669 Obesity, unspecified: Secondary | ICD-10-CM

## 2022-01-28 DIAGNOSIS — I10 Essential (primary) hypertension: Secondary | ICD-10-CM

## 2022-01-28 NOTE — Telephone Encounter (Signed)
TC to pt   Spoke with daughter [name]   Reports called for refill and when they had televisit with Dr Brooke Dare they were informed of many possible side effects that has them concerned about the use of this medication such as pt not being diabetic, risk for kidney problems and thyroid cancer  Daughter would like to discuss further with provider  Unfortunately cannot take much more time off from work as cares for both parents  Already taking time off for eye surgery soon   Appt with Lupez on 02/09/22 but would like to discuss sooner  Mychart is also a good communication option for daughter    Dairl Ponder, RN, 01/28/2022 9:30 AM

## 2022-01-29 ENCOUNTER — Encounter (HOSPITAL_BASED_OUTPATIENT_CLINIC_OR_DEPARTMENT_OTHER): Payer: Self-pay

## 2022-01-29 ENCOUNTER — Encounter (HOSPITAL_BASED_OUTPATIENT_CLINIC_OR_DEPARTMENT_OTHER): Payer: Self-pay | Admitting: Ophthalmology

## 2022-01-30 MED ORDER — TRULICITY 0.75 MG/0.5ML SC SOPN
0.7500 mg | PEN_INJECTOR | SUBCUTANEOUS | 0 refills | Status: DC
Start: 2022-01-30 — End: 2022-05-25

## 2022-02-02 ENCOUNTER — Encounter (HOSPITAL_BASED_OUTPATIENT_CLINIC_OR_DEPARTMENT_OTHER): Payer: Self-pay

## 2022-02-02 ENCOUNTER — Other Ambulatory Visit: Payer: Self-pay

## 2022-02-02 ENCOUNTER — Ambulatory Visit
Admission: RE | Admit: 2022-02-02 | Discharge: 2022-02-02 | Disposition: A | Discharge status: Home / Self Care | Payer: No Typology Code available for payment source

## 2022-02-02 NOTE — Surgery Pre-Op (Signed)
PAT Visit          Sara Snyder is a 66 year old female received a preoperative screening.        Appointments for Next 30 Days 02/02/2022 - 03/04/2022        Date Visit Type Department Provider     02/02/2022 10:30 AM PRE-ADMISSION SCREENING CALL [facility] - Pretesting [facility] PAT RN    Patient Instructions:     On this date, you will receive a phone call from a [facility] nurse to discuss your upcoming procedure. You do not need to come to the hospital for this appointment                 02/04/2022  9:00 AM POST OP 1 WEEK [facility] Patton Salles, MD    Patient Instructions:                  02/09/2022  1:00 PM ESTABLISHED PATIENT [facility] Janna Arch, MD    Patient Instructions:                  02/12/2022 10:15 AM POST OP 1 WEEK [facility] Rockey Situ, [place]    Patient Instructions:                  03/04/2022 12:30 PM TELEVISIT 30 [facility] - Nutrition Reginold Agent    Patient Instructions:     You have a video televisit appointment with your [facility] provider within the next 7 days. You will receive a notification about this the day before your appointment.  Thirty minutes before your appointment you will receive a link to your video visit in [facility] and via text and email before your scheduled appointment time.  The link will walk you through steps to make sure you are set up to connect with your provider at the time of the visit and give you access to the video visit.  If you have trouble connecting with your provider, they will call you at the time of your visit at the phone number your clinic has on file.                       Surgery Information       Future Procedures (Tomorrow to 02/02/2023)         Date Time Visit Type/Procedure Providers Location Status        02/03/2022 0915 CATARACT SURGERY WITH PC IOL ([facility]) - Right Patton Salles [facility] OR Scheduled      Visit  Type/Procedure: CATARACT SURGERY WITH PC IOL [facility]) - Right                          Procedure(s):  CATARACT SURGERY WITH PC IOL (PHACO)  02/03/2022        .     Language of care:Portuguese ([place])    Allergy History  Pollen Extract      Past Medical History:  09/11/2018: Chronic kidney disease, stage III (moderate) (HCC)  08/14/2019: COVID-19      Comment:  Risk category:   high  Date of symptom onset: 08/12/19                 Tested? Positive  Risk factors: CKD, BMI > 30, HTN  Clinical Course First date of symptoms:  08/12/19                 08/15/19: (DOI 3): Pt states she is feeling well. Slight                ST and sinus congestion. No cough, fever, SOB, DOE or                edema. F/U in 3 days.  08/18/2019: DOI 7- Feeling better                over all, stable vitals, CM f/u in 2 days. 08/20/19 DOI 9-               stable sx; o2 ex  No date: HTN (hypertension)  01/05/2018: Mild episode of recurrent major depressive disorder ([place])  02/20/2021: Osteopenia  11/29/2017: Panic disorder  No date: Wears eyeglasses     has a past surgical history that includes OB ANTEPARTUM CARE C DLVR&POSTPARTUM and Hemorrhoid surgery (10/2016).         Alcohol, Tobacco and Drug History:  Social History    Tobacco Use      Smoking status: Former        Packs/day: 2.00        Years: 25.00        Pack years: 90        Types: Cigarettes        Quit date: 10/27/1977        Years since quitting: 44.2      Smokeless tobacco: Never    E-Cigarette/Vaping  E-Cigarette Use: Never User  Quit Date:   Nicotine:   Start Date:   THC:    Cartridges/Day:   CBD:   Other Substance:   Flavoring:       Alcohol use   Not Currently       Drug use:   Unknown       Extended Emergency Contact Information  Primary Emergency Contact: Tonita Phoenix, [place] [address] Johnnette Litter of [place] Phone: [phone]  Relation: Daughter  Secondary Emergency Contact: Blimy, [place]  Mobile Phone: [phone]  Relation: Husband  Preferred  language: [place]  Interpreter needed? Yes        Current Outpatient Medications   Medication Sig    dulaglutide (TRULICITY) A999333 0000000 injection Inject 0.75 mg under the skin once a week    venlafaxine (EFFEXOR-XR) 150 MG 24 hr capsule 1 capsule daily    diclofenac (VOLTAREN) 1 % GEL Gel Apply 2 g topically 4 (four) times daily as needed    clonazePAM (KLONOPIN) 1 MG tablet TAKE 1 TABLET BY MOUTH TWICE DAILY AS NEEDED    amlodipine-valsartan (EXFORGE) 5-320 MG per tablet Take 1 tablet by mouth in the morning.    calcium citrate-citamin D (CALCIUM CITRATE +) 315-5 MG-MCG tablet Take 1 tablet by mouth in the morning and 1 tablet before bedtime.    atorvastatin (LIPITOR) 20 MG tablet Take 1 tablet by mouth in the morning.    latanoprost (XALATAN) 0.005 % ophthalmic solution Place 1 drop into both eyes nightly    atenolol (TENORMIN) 100 MG tablet Take 1 tablet by mouth nightly    loratadine (CLARITIN) 10 MG tablet Take 1 tablet by mouth in the morning.    meclizine (ANTIVERT) 25 MG TABS Take 1 tablet by mouth every 8 (eight) hours as needed  chlorthalidone (HYGROTEN) 25 MG tablet Take 1 tablet by mouth in the morning.    Elastic Bandages & Supports (RA WRIST BRACE ADJ RIGHT L/XL) MISC Use as able for carpal tunnel syndrome    diclofenac (VOLTAREN) 1 % GEL Gel Apply 8 g topically in the morning and 8 g at noon and 8 g in the evening and 8 g before bedtime.    cholecalciferol (VITAMIN D3) 2000 UNIT tablet Take 1 tablet by mouth in the morning and 1 tablet before bedtime.    montelukast (SINGULAIR) 10 MG tablet Take 1 tablet by mouth nightly    acetaminophen (TYLENOL) 500 MG tablet Take 1 tablet by mouth every 4 (four) hours as needed for Pain    levothyroxine (SYNTHROID) 50 MCG tablet Take 1 tablet by mouth every morning before breakfast    lidocaine (LIDODERM) 5 % patch Place 1 patch onto the skin in the morning.    loratadine (CLARITIN) 10 MG tablet Take 1 tablet by mouth daily as needed for  Allergies (and nose congestion)     No current facility-administered medications for this encounter.         (Not in a hospital admission)      PAT Assessment    Airways symptoms: DENTURES/PARTIAL: Yes    Airways WDL     Activity tolerance (How many flights of stairs): no sob or cp with stairs      Assistive Device: None           PAT Screening     Row Name 02/02/22 0902       Integumentary - Complete full head to toe skin assessment    Integumentary (WDL) WDL    Row Name 02/02/22 0902       STOP-Bang Questionaire     Do you snore loudly (loud enough to be heard through closed doors or   your bed-partner elbows you for snoring at night? 0    Do you often feel Tired, fatigued, or Sleepy during the daytime (such as   falling asleep when driving)? 0    Has anyone observed you stop breathing, or choking/gasping during your   sleep? 0    Do you have, or are being treated for, High Blood Pressure? 0    Height 4\' 10"  (1.473 m)    Weight 89.4 kg (197 lb)    BMI (Calculated) 41.26    Neck size large? 0    Stop-Bang Score Low                       02/02/22  0902   Weight: 89.4 kg (197 lb)   Height: 4\' 10"  (1.473 m)           Patient issues currently are       Phone screen interview completed with patient, all meds and pre op instructions reviewed with expressed understanding.       02/04/22 RN.

## 2022-02-02 NOTE — Discharge Instructions (Signed)
INSTRUES DO PR-OPERATRIO PARA O SURGICAL DAY CARE   SURGICAL DAY CARE PRE-OPERATIVE INSTRUCTIONS    DIA DA CIRURGIA  DAY OF SURGERY    Chegar em: [facility] em [place] (Tuesday), Agosto (August) 15 s (Time): 7:45 am .   Arrive at:  Registration on (Day of the Week, Month, Day, at (Time).        obrigatrio ter um adulto responsvel disponvel para acompanh-lo at [employer] a [place]. (Sugerimos que voc tenha algum disponvel para ajud-lo em casa aps a sua cirurgia).  You must have a responsible adult available to accompany you home after surgery. (We suggest that you have someone available to assist you at home after your surgery).    INSTRUES:   INSTRUCTIONS:  Voc no poder comer ou beber nada aps a meia-noite da noite anterior  cirurgia, nem mesmo gua, goma de mascar ou balas.   You may have nothing to eat or drink after midnight the night before your surgery, not even water, gum or hard candy.    Voc no poder fumar na manh do dia da sua cirurgia.  You may not smoke the morning of your surgery.    Por favor, deixe em casa todos os objetos de valor, incluindo joias, relgios, dinheiro, etc.  Please leave all valuables at home, including jewelry, watches, money, etc.    Remova qualquer esmalte das unhas antes de chegar ao [facility] Care e no use qualquer maquiagem ou batom.  Please remove all fingernail polish before arriving at Surgical Day Care and do not  wear any face or lip make-up.    Se for fazer cirurgia ocular, no aplique maquiagem nos olhos ou no rosto e evite hidratantes faciais e perfumes.  If you are having eye surgery, do not wear any eye or face makeup and avoid facial moisturizers and perfumes.    Por favor, retire quaisquer extenses de cabelo que possam ser [employer].  Please take out any hair extensions that can be removed.    No raspe os pelos da rea da cirurgia.  Do not shave surgical site.    No use lentes de contato.  Do not wear contact  lenses.    MEDICAMENTOS:   MEDICATIONS:     Tome a medicao a seguir na noite anterior  cirurgia, na hora de dormir: usual medicines   Take the following medication the night before surgery at bedtime:     [place] a seguir na manh do dia da sua cirurgia, com apenas um gole de gua: usual morning medicines  ,  except hold chlorthalidone until after surgery  Take the following medication the morning of your surgery with only a sip of water:

## 2022-02-03 ENCOUNTER — Ambulatory Visit (HOSPITAL_BASED_OUTPATIENT_CLINIC_OR_DEPARTMENT_OTHER): Payer: No Typology Code available for payment source | Admitting: Anesthesiology

## 2022-02-03 ENCOUNTER — Ambulatory Visit
Admission: RE | Admit: 2022-02-03 | Discharge: 2022-02-03 | Disposition: A | Discharge status: Home / Self Care | Payer: No Typology Code available for payment source | Attending: Ophthalmology | Admitting: Ophthalmology

## 2022-02-03 ENCOUNTER — Other Ambulatory Visit: Payer: Self-pay

## 2022-02-03 ENCOUNTER — Encounter (HOSPITAL_BASED_OUTPATIENT_CLINIC_OR_DEPARTMENT_OTHER): Payer: Self-pay | Admitting: Ophthalmology

## 2022-02-03 ENCOUNTER — Ambulatory Visit (HOSPITAL_BASED_OUTPATIENT_CLINIC_OR_DEPARTMENT_OTHER)
Admission: RE | Disposition: A | Discharge status: Home / Self Care | Payer: Self-pay | Source: Ambulatory Visit | Attending: Ophthalmology

## 2022-02-03 DIAGNOSIS — Z79899 Other long term (current) drug therapy: Secondary | ICD-10-CM | POA: Insufficient documentation

## 2022-02-03 DIAGNOSIS — I129 Hypertensive chronic kidney disease with stage 1 through stage 4 chronic kidney disease, or unspecified chronic kidney disease: Secondary | ICD-10-CM | POA: Insufficient documentation

## 2022-02-03 DIAGNOSIS — H25811 Combined forms of age-related cataract, right eye: Secondary | ICD-10-CM | POA: Diagnosis not present

## 2022-02-03 DIAGNOSIS — N1831 Chronic kidney disease, stage 3a: Secondary | ICD-10-CM | POA: Insufficient documentation

## 2022-02-03 DIAGNOSIS — H2181 Floppy iris syndrome: Secondary | ICD-10-CM | POA: Diagnosis not present

## 2022-02-03 DIAGNOSIS — Z87891 Personal history of nicotine dependence: Secondary | ICD-10-CM | POA: Insufficient documentation

## 2022-02-03 DIAGNOSIS — K219 Gastro-esophageal reflux disease without esophagitis: Secondary | ICD-10-CM | POA: Insufficient documentation

## 2022-02-03 DIAGNOSIS — E038 Other specified hypothyroidism: Secondary | ICD-10-CM | POA: Diagnosis not present

## 2022-02-03 LAB — BLOOD SUGAR FINGERSTICK (POINT OF CARE): FINGERSTICK GLUCOSE: 107 mg/dl (ref 74–160)

## 2022-02-03 SURGERY — PHACOEMULSIFICATION, CATARACT, WITH POSTERIOR CHAMBER IOL INSERTION
Anesthesia: Monitor Anesthesia Care | Site: Eye | Laterality: Right | Wound class: Class I/ Clean

## 2022-02-03 MED ORDER — MOXIFLOXACIN HCL 0.5 % OP SOLN
1.0000 [drp] | OPHTHALMIC | Status: AC
Start: 2022-02-03 — End: 2022-02-03
  Administered 2022-02-03 (×3): 1 [drp] via OPHTHALMIC

## 2022-02-03 MED ORDER — ACETAMINOPHEN 325 MG PO TABS
650.0000 mg | ORAL_TABLET | Freq: Four times a day (QID) | ORAL | Status: DC | PRN
Start: 2022-02-03 — End: 2022-02-03

## 2022-02-03 MED ORDER — DEXAMETHASONE SODIUM PHOSPHATE 4 MG/ML IJ SOLN
INTRAMUSCULAR | Status: AC
Start: 2022-02-03 — End: 2022-02-03
  Administered 2022-02-03: 2 mg
  Filled 2022-02-03: qty 1

## 2022-02-03 MED ORDER — LIDOCAINE-EPINEPHRINE 2 %-1:200000 IJ SOLN
INTRAMUSCULAR | Status: DC
Start: 2022-02-03 — End: 2022-02-03
  Filled 2022-02-03: qty 10

## 2022-02-03 MED ORDER — EPINEPHRINE HCL 1 MG/ML INJECTION
Freq: Once | INTRAMUSCULAR | Status: AC
Start: 2022-02-03 — End: 2022-02-03
  Filled 2022-02-03: qty 0.5

## 2022-02-03 MED ORDER — PHENYLEPHRINE HCL 2.5 % OP SOLN
1.0000 [drp] | OPHTHALMIC | Status: AC
Start: 2022-02-03 — End: 2022-02-03
  Administered 2022-02-03 (×3): 1 [drp] via OPHTHALMIC

## 2022-02-03 MED ORDER — LIDOCAINE HCL (PF) 1 % IJ SOLN
INTRAMUSCULAR | Status: AC
Start: 2022-02-03 — End: 2022-02-03
  Administered 2022-02-03: 1 mL
  Filled 2022-02-03: qty 2

## 2022-02-03 MED ORDER — TETRACAINE HCL 0.5 % OP SOLN
OPHTHALMIC | Status: AC
Start: 2022-02-03 — End: 2022-02-03
  Administered 2022-02-03: 1 via OPHTHALMIC
  Filled 2022-02-03: qty 4

## 2022-02-03 MED ORDER — ERYTHROMYCIN 5 MG/GM OP OINT
0.50 [in_us] | TOPICAL_OINTMENT | Freq: Once | OPHTHALMIC | Status: AC
Start: 2022-02-03 — End: 2022-02-03
  Administered 2022-02-03: 0.5 [in_us] via OPHTHALMIC

## 2022-02-03 MED ORDER — ACETYLCHOLINE CHLORIDE 20 MG IO SOLR
INTRAOCULAR | Status: AC
Start: 2022-02-03 — End: 2022-02-03
  Administered 2022-02-03: 10 mg via INTRAOCULAR
  Filled 2022-02-03: qty 2

## 2022-02-03 MED ORDER — PREDNISOLONE ACETATE 1 % OP SUSP
1.0000 [drp] | Freq: Four times a day (QID) | OPHTHALMIC | Status: DC
Start: 2022-02-03 — End: 2022-02-03
  Administered 2022-02-03: 1 [drp] via OPHTHALMIC

## 2022-02-03 MED ORDER — CEFAZOLIN SODIUM 1 G IJ SOLR
INTRAMUSCULAR | Status: AC
Start: 2022-02-03 — End: 2022-02-03
  Administered 2022-02-03: 50 mg via OPHTHALMIC
  Filled 2022-02-03: qty 10

## 2022-02-03 MED ORDER — FLURBIPROFEN SODIUM 0.03 % OP SOLN
1.0000 [drp] | OPHTHALMIC | Status: AC
Start: 2022-02-03 — End: 2022-02-03
  Administered 2022-02-03 (×3): 1 [drp] via OPHTHALMIC

## 2022-02-03 MED ORDER — CYCLOPENTOLATE HCL 1 % OP SOLN
1.0000 [drp] | OPHTHALMIC | Status: AC
Start: 2022-02-03 — End: 2022-02-03
  Administered 2022-02-03 (×3): 1 [drp] via OPHTHALMIC

## 2022-02-03 MED ORDER — TROPICAMIDE 1 % OP SOLN
1.0000 [drp] | OPHTHALMIC | Status: AC
Start: 2022-02-03 — End: 2022-02-03
  Administered 2022-02-03 (×3): 1 [drp] via OPHTHALMIC

## 2022-02-03 MED ORDER — BUPIVACAINE HCL (PF) 0.75 % IJ SOLN
INTRAMUSCULAR | Status: DC
Start: 2022-02-03 — End: 2022-02-03
  Filled 2022-02-03: qty 10

## 2022-02-03 MED ORDER — BSS IO SOLN
INTRAOCULAR | Status: AC
Start: 2022-02-03 — End: 2022-02-03
  Administered 2022-02-03: 1
  Filled 2022-02-03: qty 15

## 2022-02-03 MED ORDER — SODIUM HYALURONATE 13.8 MG/0.6ML IO SOSY
PREFILLED_SYRINGE | INTRAOCULAR | Status: AC
Start: 2022-02-03 — End: 2022-02-03
  Administered 2022-02-03: 1 via OPHTHALMIC
  Filled 2022-02-03: qty 0.6

## 2022-02-03 SURGICAL SUPPLY — 16 items
ALCON EYE PACK W/BSS SOLUTION (PACK) ×1 IMPLANT
ANTERIOR CHAMBER CANNULA 27GA (OPHTH) ×1 IMPLANT
BETADINE 5% PREP SOLUTION (PREP) ×1 IMPLANT
DRESSING SPONGE 4X4 (DRESSING) ×2 IMPLANT
EXTENSION SET MACROBORE (IVSETS) ×1 IMPLANT
I/A HANDPIECE 45D SILICONE (OPHINST) ×1 IMPLANT
I/O LENS +26.5 SN60WF ×1 IMPLANT
INSYTE IV CATHETER 20GAX1.16IN (IVCATH) ×1 IMPLANT
MALYUGIN RING 7.0MM (OPHSURG) ×1 IMPLANT
MONARCH III IOL INSERTER CART (LENSACC) ×1 IMPLANT
NASAL CANNULA W/CO2 SAMPLING (CANNULA) ×1 IMPLANT
NYLON SUTURE 10-0 AU8 (SUTURE) ×1 IMPLANT
PACK EYE (PACK) ×1 IMPLANT
SLIT KNIFE 2.8MM ANGLD SGL BVL (OPHSURG) ×1 IMPLANT
STERILE WATER IRR. 500ML (SOLUTION) ×1 IMPLANT
SURGEON GLOVE LF/PF 8 STER (GLOVE) ×1 IMPLANT

## 2022-02-03 NOTE — Nursing Note (Signed)
Gave instruction to daughter with interpretor

## 2022-02-03 NOTE — Anesthesia Preprocedure Evaluation (Signed)
Pre-Anesthetic Note  .      Patient: Sara Snyder is a 66 year old female      Procedure Information     Date/Time: 02/03/22 0915    Procedure: CATARACT SURGERY WITH PC IOL (PHACO) (Right: Eye)    Diagnosis: Combined forms of age-related cataract of right eye [H25.811]    Pre-op diagnosis: Combined forms of age-related cataract of right eye [H25.811]    Location: [facility] / [place] [facility]    Surgeons: Charise Carwin, MD          Relevant Problems   PULMONARY   (+) DOE (dyspnea on exertion)      CARDIO   (+) Bilateral renal artery stenosis (HCC)   (+) DOE (dyspnea on exertion)   (+) Essential hypertension      GI   (+) Gastroesophageal reflux disease without esophagitis      GU/RENAL   (+) Stage 3a chronic kidney disease      ENDO   (+) Other specified hypothyroidism           Previous Anesthetic History:   Past Surgical History:  10/2016: HEMORRHOID SURGERY  No date: OB ANTEPARTUM CARE CESAREAN DLVR & POSTPARTUM        Medications  Current Facility-Administered Medications   Medication   . EPINEPHrine (ADRENALIN) 0.5 mg in balanced salts (BSS) 500 mL irrigation   . lidocaine 2%-EPINEPHrine 1:200,000 2 %-1:200000 injection   . Acetylcholine Chloride (MIOCHOL-E) 20 MG intra-ocular injection   . dexamethasone (DECADRON) 4 MG/ML injection   . tetracaine (PONTOCAINE) 0.5 % ophthalmic solution   . ceFAZolin (ANCEF) 1 g injection   . bupivacaine (MARCAINE) 0.75 % injection   . lidocaine (PF) 1 % injection   . balanced salts (BSS) ophthalmic solution   . prednisoLONE acetate (PRED FORTE) 1 % ophthalmic suspension 1 drop   . erythromycin [facility]) ophthalmic ointment 0.5 Inch         Allergies:   Review of Patient's Allergies indicates:   Pollen extract          Cough    Smoking, Alcohol, Drugs:  Social History    Tobacco Use      Smoking status: Former        Packs/day: 2.00        Years: 25.00        Pack years: 50        Types: Cigarettes        Quit date: 10/27/1977        Years since quitting: 44.3      Smokeless tobacco: Never     Alcohol use: Not Currently      Drug use: Unknown       PMHx:  Past Medical History:  09/11/2018: Chronic kidney disease, stage III (moderate) ([place])  08/14/2019: COVID-19      Comment:  Risk category:   high  Date of symptom onset: 08/12/19                 Tested? Positive  Risk factors: CKD, BMI > 30, HTN                 Clinical Course First date of symptoms:  08/12/19                 08/15/19: (DOI 3): Pt states she is feeling well. Slight                ST and sinus congestion. No cough, fever, SOB,  DOE or                edema. F/U in 3 days.  08/18/2019: DOI 7- Feeling better                over all, stable vitals, CM f/u in 2 days. 08/20/19 DOI 9-               stable sx; o2 ex  No date: HTN (hypertension)  01/05/2018: Mild episode of recurrent major depressive disorder ([place])  02/20/2021: Osteopenia  11/29/2017: Panic disorder  No date: Wears eyeglasses    Vitals  BP 147/74   Pulse 73   Temp 97.2 F (36.2 C) (Temporal)   Resp 22   Ht _0  (1.499 m)   Wt 88 kg (194 lb)   SpO2 97%   BMI 39.18 kg/m     Anesthesia Evaluation    Physical Exam    General     Level of consciousness:  Alert and oriented (time, person, place)   BMI   BMI greater than 30 kg/m2   Airway     Mallampati:  II    TM distance:  >3 FB    Mouth opening:  >3 FB    Neck ROM:  Full   Teeth     Heart   - normal exam     Lungs - normal exam     Other findings: Upper plate.  None loose per pt.  4 left on lower jaw.                Pertinent Labs:   Lab Results   Component Value Date    NA 139 10/22/2021    K 4.0 10/22/2021    CREAT 1.2 10/22/2021    GLUCOSER 103 10/22/2021    WBC 5.5 07/14/2021    HCT 34.5 07/14/2021    PLTA 303 07/14/2021         Anesthesia Plan    ASA Score:     ASA:  3    Airway:      Mallampati:  II    Mouth opening:  >3 FB    Neck ROM:  Full    TM distance:  >3 FB    Plan: MAC    Other information:     EKG Reviewed: : Yes      Post-Plan::  PACU    Informed Consent:     Anesthetic plan and risks discussed with:  Patient    Patient Consented

## 2022-02-03 NOTE — Anesthesia Postprocedure Evaluation (Signed)
Anesthesia Post-Operative Evaluation Note    Patient: Sara Snyder           Procedure Summary     Date: 02/03/22 Room / Location: [place] [facility] 6 / [place] [facility]    Anesthesia Start: [phone] Anesthesia Stop:     Procedure: CATARACT SURGERY WITH PC IOL (PHACO) (Right: Eye) Diagnosis:       Combined forms of age-related cataract of right eye      (Combined forms of age-related cataract of right eye [H25.811])    Surgeons: Charise Carwin, MD Responsible Provider:     Anesthesia Type: MAC ASA Status: 3            POST-OPERATIVE EVALUATION    Anesthesia Post Evaluation    Vitals signs in patient's normal range: Yes  Respiratory function stable; airway patent: Yes  Cardiovascular function stable: Yes  Hydration status stable: Yes  Mental status recovered; patient participates in evaluation and/or is at baseline: Yes  Pain control satisfactory: Yes  Nausea and vomiting control satisfactory: YesProcedure was labor & delivery no  Anesthesia Observation no significant observation    Anesthesia MIPS:    The patient is a current smoker (G9642)(e.g. cigarette, cigar, pipe, e-cigarette/vaping/marijuana): No  Emergent - Exclusion case (N4627): No  Patient was administered multimodal pain management (two or more drugs and/or interventions excluding systemic opioids) in the perioperative period; occurring at some time between 6 hours prior to anesthesia start time until discharged from PACU (O3500): No  List medical reason(s) patient did not receive multimodal pain management (X3818): no pain anticipated postop  Anesthesia start to Anesthesia end time was 60 minutes or longer (4255F): Yes  Anesthesia administered was General (Inhalational, or TIVA) or Neuraxial block (E9937): No  Did patient receive MAC (J6967)   At least one body temperature greater than 95.56F/35.5C achieved within the 30 mins immediately prior to or the15 mins immediately following anesthesia end time (E9381): Yes  Patient received an inhalational anesthetic (4554F):  No  Pediatric patient?: No    eOptimetrix # [id number]          Last vitals    BP: 147/74 (02/03/2022  8:19 AM)  Temp: 97.2 F (36.2 C) (02/03/2022  8:19 AM)  Pulse: 73 (02/03/2022  8:19 AM)  Resp: 22 (02/03/2022  8:19 AM)  SpO2: 97 % (02/03/2022  8:19 AM)

## 2022-02-03 NOTE — H&P (Signed)
Today I have discussed symptoms and complaints with the patient, reviewed the patient's vital signs, and repeated a focused examination of the patient. There are not any changes since the complete H&P which was already done by Lindalou Hose, PA-C, on 01/14/22 ,which I have reviewed today.      02/03/22  0818 02/03/22  0819   BP:  147/74   Pulse:  73   Resp:  22   Temp:  97.2 F (36.2 C)   TempSrc:  Temporal   SpO2:  97%   Weight: 88 kg (194 lb)    Height: 4\' 11"  (1.499 m)          The patient was in no apparent distress    HEENT-grossly wnl  Cor-S1S2  Lungs-Clear  Abdomen-soft, non tender  Extremities-no gross edema/swelling    ROS-no new complaints or symptoms  Medications reviewed  Allergies reviewed    The patient is medically stable to proceed with the planned ocular surgery today.

## 2022-02-03 NOTE — Discharge Instructions (Addendum)
You need to be seen at the  [facility].  The office is located at [address]  [place].  The phone number is [phone] if you have any questions.     Start taking Vigamox and Pred forte one drop of each in the eye that had surgery today  4 time per day starting TODAY after Lunch (around 1 PM)    .  Use both drops after lunch, dinner, before bed, and after breakfast.  Put the shield back over your eye between taking the drops and secure with a piece of tape    Call the office  [phone] if you have any problems of concerns.  The answering service will answer the call 24 hours per day.  Ask them to page Dr. Patton Salles  d

## 2022-02-03 NOTE — Op Note (Addendum)
PREOPERATIVE DIAGNOSIS:    Cataract right eye.    POSTOPERATIVE DIAGNOSIS:        1. Cataract right eye 2. Intraoperative floppy iris syndrome    Date of Procedure-                                                      02/03/2022     OPERATION:                                                Complex Clear corneal cataract extraction with                                                                  posterior chamber intraocular lens                      model [id number]                                                                +26.5   diopters  right    Eye requiring a Malyugin ring to control the flaccid iris..    SURGEON: Jari Favre. Atalia Litzinger, MD    ANESTHESIA:                     Intracameral and topical    COMPLICATIONS:                           None.    After a Time Out to identify the correct operative eye, the right eye was prepped and draped in the usual manner appropriate for intraocular surgery.  The operating microscope was used for good surgical control at all times.   A paracentis was performed with a super blade.  Preservative free lidocaine was injected into the anterior chamber.  A viscoelastic material was then injected into the anterior chamber to deepen it.  A clear corneal temporal wound was created using a 2.8 mm keratome in a triplanar fashion approximately 1.5 mm from the limbus. The iris was extremely flaccid and required placement of a Malyugin ring to keep the prolapsing iris away from the wound and dilated the miotic pupil.A continuous curvilinear  capsulorrhexis was performed using a bent-needle cystotome and capsulorrhexis forceps.  Phacoemulsification of the nucleus was performed using a quartering-and-cracking technique. Automated irrigation/aspiration of the residual cortical material was then performed.  The intraocular lens was then injected into the capsular bag. The Malyugin ring was removed.  Miochol was used to constrict the pupil. Automated irrigation/aspiration of  residual  viscoelastic material was then performed.  Balanced salt solution was used to inflate the anterior chamber. A single 10-0 nylon suture was placed to compress  the wound.  The wound was tested and found to be watertight.  A Subtenon injections with 50 mg Kefzol and 2 mg dexamethasone were administered inferiorly, well away from the wound.  A drop of Vigamox was placed on the surface of the globe, as well of a ribbon of Ilotycin ointment.  A Sterile shield was placed over the eye and secured with paper tape.    The patient was returned to the ambulatory Day Surgery Unit in good conditions.  The patient was instructed to start taking Vigamox and Prednisolone eye drops in the post operative eye starting 4 hours after the surgery. The patient was instructed to put the shield back on over the eye after putting in the drops and to make sure it is secured with tape.    The patient was also instructed to follow up with Dr. Nathaneil Canary the following day and to call the office and have him paged if there are any concerns at any time.

## 2022-02-04 ENCOUNTER — Ambulatory Visit: Payer: No Typology Code available for payment source | Attending: Ophthalmology | Admitting: Ophthalmology

## 2022-02-04 ENCOUNTER — Encounter (HOSPITAL_BASED_OUTPATIENT_CLINIC_OR_DEPARTMENT_OTHER): Payer: Self-pay | Admitting: Ophthalmology

## 2022-02-04 DIAGNOSIS — H25812 Combined forms of age-related cataract, left eye: Secondary | ICD-10-CM | POA: Diagnosis present

## 2022-02-04 MED ORDER — PREDNISOLONE ACETATE 1 % OP SUSP
1.00 [drp] | Freq: Four times a day (QID) | OPHTHALMIC | 1 refills | Status: AC
Start: 2022-02-04 — End: 2022-03-07

## 2022-02-04 MED FILL — PREDNISOLONE SUS 1% OP: 37 days supply | Qty: 10 | Fill #0

## 2022-02-04 NOTE — Addendum Note (Signed)
Addended by: Patton Salles on: 02/04/2022 10:51 AM     Modules accepted: Orders

## 2022-02-04 NOTE — Progress Notes (Signed)
Post op day one; She is doing pretty well, comfortable night with only mild discomfort.    S/P Phaco/IOL/OD on 02/03/2022     PF QID  Moxifloxacin QID   Shield at night/Napping       Left eye to be scheduled

## 2022-02-04 NOTE — Progress Notes (Addendum)
Sara Snyder was seen day #1 s/p uncomplicated cataract surgery right eye    Impression,    1. Pseudophakia OD day #1,    She was doing well.    She was told to  use Pred Forte 1% and Vigamox 1 gtt in the post operative eye QID.      She was instructed to cover the operated eye with the metal shield while sleeping for the first week and to return to clinic in  one week for follow up.     She was also instructed to return to clinic immediately if there is decreased vision, ocular pain, or any other problems that develop.      2. Cataract OD-she would like CE/IOL

## 2022-02-07 MED FILL — VENLAFAXINE 150MG ER: 30 days supply | Qty: 30 | Fill #0

## 2022-02-09 ENCOUNTER — Ambulatory Visit (HOSPITAL_BASED_OUTPATIENT_CLINIC_OR_DEPARTMENT_OTHER): Payer: No Typology Code available for payment source

## 2022-02-09 NOTE — Progress Notes (Deleted)
Chief Complaint:   Sara Snyder is a 66 year old year-old female w/ HTN, HLD, prediabetes, palpitations, obesity, secondary hyperparathyroidism, labrynthitis, allergic rhinitis, CKDIII, panic d/o, MDD, anxiety, osteopenia, arthralgias, chronic cough, insomnia, recurrent epistaxis, subclinical hypothyroid, vit d deficiency, b/l renal artery stenosis, and GERDwho presents for follow up.     Last seen by me 12/22/21->  -HLD: Started atorvastatin for ASCVD greater than 15%  -DOE: No worsening or improvement, likely due to deconditioning given negative work-up, encouraged weight loss  -Obesity: Started Trulicity 0.75 mg  -CKD: Repeat microalbuminuria, elevated last visit  -Cervical cancer screening: Discussed no further need for Paps  -Preoperative exam: Medically optimized  -Polyarthralgia: Rheumatology visit, continue Tylenol as needed  -Carpal tunnel syndrome: Difficulty tolerating wrist brace, consider Ortho follow-up if symptoms not improving with wrist brace    Interval hx->   -7/5 rheumatology OV: Presentation more consistent with fibromyalgia, additional labs, x-rays, consider trial of prednisone; started calcium for osteopeni   -7/6 TE: Microalbuminuria normal  -7/10 rheumatology OV: X-rays consistent with degenerative changes, schistosomiasis antibody positive, gave treatment with praziquantel; referred to PT for low back pain; encouraged mindfulness practice for diffuse pain, consider duloxetine, gabapentin, pregabalin  -8/2 nutrition televisit: Requested referral to benefit program to get her own fresh foods  -MyChart messages and televisit with Dr. Brooke Dare about risks and benefit of Trulicity for weight loss  -8/15 cataract surgery of right eye  -8/16 ophtho postop appointment, doing well    Today->   #Obesity  -Trulicity tolerating?  -Diet and exercise changes    #Polyarthralgias  -On venlafaxine 150 mg every 24 hours; next dose to 25 mg daily    #Carpal tunnel  -Using wrist brace?    #Low back pain  -Scheduled  PT?    #Vertigo  -Recent refill of meclizine, need to get history      #Health maintenance  ZOSTER VACCINE(1 of 2) Never done  Colorectal Cancer Screening due on 12/03/2018  COVID-19 Vaccine(6 - Pfizer series) due on 11/11/2021      PHYSICAL EXAMINATION:  There were no vitals taken for this visit.  General: well appearing young*** female in no acute distress  HEENT: normocephalic/atraumatic, MMM, EOMI  Cardiac: RRR, normal s1/s2, no MRG  Pulm: normal work of breathing, CTAB  Abdomen: positive bowel sounds, soft, non distended, no TTP  Extremities: no peripheral edema, strong distal pulses  Neuro: A&O, CN grossly in tact, moving all extremities  Psych: normal mood and affect     ASSESSMENT/PLAN:      Problem List Items Addressed This Visit    None       Follow-up:      Janna Arch, MD, MPH  Internal Medicine [phone]

## 2022-02-11 ENCOUNTER — Encounter (HOSPITAL_BASED_OUTPATIENT_CLINIC_OR_DEPARTMENT_OTHER): Payer: Self-pay

## 2022-02-11 DIAGNOSIS — F330 Major depressive disorder, recurrent, mild: Secondary | ICD-10-CM

## 2022-02-11 DIAGNOSIS — E669 Obesity, unspecified: Secondary | ICD-10-CM

## 2022-02-11 MED ORDER — VENLAFAXINE HCL ER 150 MG PO CP24
ORAL_CAPSULE | ORAL | 3 refills | Status: DC
Start: 2022-02-11 — End: 2022-05-19

## 2022-02-11 MED FILL — TRULICITY INJ 0.75/0.5: 28 days supply | Qty: 4 | Fill #0

## 2022-02-11 NOTE — Telephone Encounter (Signed)
PER Pharmacy, Sara Snyder is a 66 year old female has requested a refill of      -  2 RX'S       Last Office Visit: 01/23/2022 with Elmer Bales  Last Physical Exam: 06/13/2020     There are no preventive care reminders to display for this patient.     Other Med Adult:  Most Recent BP Reading(s)  02/03/22 : 134/67        Cholesterol (mg/dL)   Date Value   95/18/8416 215     LOW DENSITY LIPOPROTEIN DIRECT (mg/dL)   Date Value   60/63/0160 153     HIGH DENSITY LIPOPROTEIN (mg/dL)   Date Value   10/93/2355 44     TRIGLYCERIDES (mg/dL)   Date Value   73/22/0254 148         THYROID SCREEN TSH REFLEX FT4 (uIU/mL)   Date Value   09/29/2021 2.730         TSH (THYROID STIM HORMONE) (uIU/mL)   Date Value   01/14/2021 7.090 (H)       HEMOGLOBIN A1C (%)   Date Value   07/14/2021 6.3 (H)       No results found for: POCA1C      No results found for: INR    SODIUM (mmol/L)   Date Value   10/22/2021 139       POTASSIUM (mmol/L)   Date Value   10/22/2021 4.0           CREATININE (mg/dL)   Date Value   27/11/2374 1.2        Documented patient preferred pharmacies:    [facility] [place], [place] - [address]  Phone: [phone] Fax: [phone]

## 2022-02-12 ENCOUNTER — Other Ambulatory Visit: Payer: Self-pay

## 2022-02-12 ENCOUNTER — Ambulatory Visit: Payer: No Typology Code available for payment source | Attending: Ophthalmology | Admitting: Optometrist

## 2022-02-12 DIAGNOSIS — Z9841 Cataract extraction status, right eye: Secondary | ICD-10-CM | POA: Diagnosis present

## 2022-02-12 DIAGNOSIS — Z961 Presence of intraocular lens: Secondary | ICD-10-CM | POA: Diagnosis present

## 2022-02-12 NOTE — Progress Notes (Signed)
Sara Snyder is doing well one week s/p phacoemulsification with Posterior Chamber Intraocular lens implant of the right eye.  she is instructed to discontinue the Vigamox / Moxifloxacin / Ciloxan eye drops one week after surgery.  she will use Pred Forte 1% (or Econopred, , prednisolone) eye drops one drop, four times a day for one month totally in the  right eye.   The patient will call us immediately for any eye pain, increased redness, or for decreased vision.  Otherwise she will f/u in three-four weeks for refraction and dilated examination.

## 2022-02-12 NOTE — Progress Notes (Signed)
Patient here today for 1 week POST OP PHACO IOL OD 02/03/22 SP . Pt reports mild improvements  in vision OD, No flashes, No floaters, No complaints

## 2022-02-19 ENCOUNTER — Ambulatory Visit (HOSPITAL_BASED_OUTPATIENT_CLINIC_OR_DEPARTMENT_OTHER): Payer: No Typology Code available for payment source | Admitting: "Psychiatric/Mental Health

## 2022-02-24 ENCOUNTER — Encounter (HOSPITAL_BASED_OUTPATIENT_CLINIC_OR_DEPARTMENT_OTHER): Payer: Self-pay | Admitting: Internal Medicine

## 2022-02-24 ENCOUNTER — Ambulatory Visit: Payer: No Typology Code available for payment source | Attending: Internal Medicine | Admitting: Internal Medicine

## 2022-02-24 ENCOUNTER — Other Ambulatory Visit: Payer: Self-pay

## 2022-02-24 VITALS — BP 120/75 | HR 77 | Temp 96.4°F | Resp 18 | Ht 58.27 in | Wt 196.0 lb

## 2022-02-24 DIAGNOSIS — Z01818 Encounter for other preprocedural examination: Secondary | ICD-10-CM | POA: Insufficient documentation

## 2022-02-24 DIAGNOSIS — R7303 Prediabetes: Secondary | ICD-10-CM | POA: Diagnosis present

## 2022-02-24 LAB — HEMOGLOBIN A1C
ESTIMATED AVERAGE GLUCOSE: 137 mg/dL (ref 74–160)
HEMOGLOBIN A1C: 6.4 % — ABNORMAL HIGH (ref 4.0–5.6)

## 2022-02-24 MED FILL — CLONAZEPAM 1MG: 30 days supply | Qty: 60 | Fill #1 | Status: CP

## 2022-02-24 NOTE — Progress Notes (Signed)
66 y/o F clinic visit:    #Pre-op eval prior to eye surgery:  Per pt planning eye surgery on left eye, had similar one in the past on the right  Per pt for cataracts, impaired vision in that eye  No aspirin daily nor blood thinners  She is unclear if she still has klonopin (in her med list, but not using daily)  No SOB, no chest pain  No known sleep apnea  No known hx problem w anesthesia  From chart hx renal disease Cr 1.2 5/23, last A1C 1/23 6.3  Daughter and pt share she stopped statin recently given aches which are now resolved off, she would like to stay off for now    ROS: No overt headache, no overt fever, nor acute sickness, no shortness of breath, no chest pain, no abdominal pain, no difficulty walking    Physical exam  Vitals: Reviewed BP 120/75 BMI 41  General: Overtly normal affect & conversation  HEENT: Overtly normal sclera, pupils and gaze  Pulm: Clear to auscultation bilaterally, no wheeze nor crackles  CV: Overtly normal heart rate and rhythm  Abd: No overt distention nor pain   Extrem: No overt swelling of the lower legs  Neuro: Overtly normal gait    A&P  #Pre-op for eye surgery:  Overtly ~moderate risk, though marked obesity, hx pre-diabetes & renal disease noted.  -A1C  -Advised to remain off klonopin (not currently taking) and NSAIDs pre-surgery  -May may remain off statin with f/u with PCP 3 mo (likely repeat lipids & re-eval need)  -Counseled regarding nutrition, exercise as able & general self care  -Counseled patient re signs & symptoms to prompt swift re-evaluation    #F/U with Optho & PCP.

## 2022-02-25 ENCOUNTER — Other Ambulatory Visit (HOSPITAL_BASED_OUTPATIENT_CLINIC_OR_DEPARTMENT_OTHER): Payer: Self-pay

## 2022-02-25 DIAGNOSIS — K219 Gastro-esophageal reflux disease without esophagitis: Secondary | ICD-10-CM

## 2022-02-25 DIAGNOSIS — R140 Abdominal distension (gaseous): Secondary | ICD-10-CM

## 2022-02-25 DIAGNOSIS — R1013 Epigastric pain: Secondary | ICD-10-CM

## 2022-02-25 MED ORDER — OMEPRAZOLE 20 MG PO CPDR
20.0000 mg | DELAYED_RELEASE_CAPSULE | Freq: Every day | ORAL | 3 refills | Status: DC
Start: 2022-02-25 — End: 2022-05-01

## 2022-02-25 NOTE — Telephone Encounter (Signed)
-----   Message from [place], [place] sent at 02/25/2022  1:51 PM EDT -----  Regarding: RX REFILL-omeprazole (PRILOSEC) 20 MG capsule      Sig: Take 1 capsule by mouth in the morning.  Sara Snyder [id number], 66 year old, female    Calls today:  Refill        Medicine Name: omeprazole (PRILOSEC) 20 MG capsule       Sig: Take 1 capsule by mouth in the morning.             [facility] [place], [place] - [address]  Phone: [phone] Fax: [phone]      Person calling on behalf of patient: Pharmacy      Patient's language of care: [place] ([place]    Patient needs a [place] interpreter.    Patient's PCP: Janna Arch, MD    Primary Care Home Site:  [facility]

## 2022-02-26 ENCOUNTER — Telehealth (HOSPITAL_BASED_OUTPATIENT_CLINIC_OR_DEPARTMENT_OTHER): Payer: Self-pay

## 2022-02-26 ENCOUNTER — Other Ambulatory Visit (HOSPITAL_BASED_OUTPATIENT_CLINIC_OR_DEPARTMENT_OTHER): Payer: Self-pay

## 2022-02-26 NOTE — Telephone Encounter (Signed)
Interpreter:Francisco   ID #: [id number]      Spoke with dtr, Avanell Shackleton test results   They are aware of diet changes. Has decreased rice and sweets already.   Instructed to keep doing so. Pt likes sweet coffees, encouraged daughter to change to artificial sweeteners.  Discussed increase in exercise.   F/u made in 3 months with pcp    Dtr agreeable and verbalized understanding    Paulino Rily, RN, 02/26/2022

## 2022-02-26 NOTE — Telephone Encounter (Signed)
-----   Message from Truddie Coco, MD sent at 02/26/2022 11:39 AM EDT -----  Regarding: pt call, kind thanks  Dear RN,  Pls let pt know A1C still in pre-diabetes range  Pls counsel re healthy eating & regular exercise as able  Pls make f/u with PCP 3-6 mo re: f/u A1C & repeat lipids (re-eval need for statin)  Thank you!  Huntley Dec

## 2022-02-27 ENCOUNTER — Encounter (HOSPITAL_BASED_OUTPATIENT_CLINIC_OR_DEPARTMENT_OTHER): Payer: Self-pay

## 2022-02-27 ENCOUNTER — Ambulatory Visit
Admission: RE | Admit: 2022-02-27 | Discharge: 2022-02-27 | Disposition: A | Discharge status: Home / Self Care | Payer: No Typology Code available for payment source

## 2022-02-27 ENCOUNTER — Other Ambulatory Visit: Payer: Self-pay

## 2022-02-27 NOTE — Discharge Instructions (Signed)
INSTRUES DO PR-OPERATRIO PARA O SURGICAL DAY CARE   SURGICAL DAY CARE PRE-OPERATIVE INSTRUCTIONS    DIA DA CIRURGIA  DAY OF SURGERY    Chegar em: [facility] em [place] (Tuesday), [place] (September) 19 s (Time): 8:45 am .   Arrive at:  Registration on (Day of the Week, Month, Day, at (Time).        obrigatrio ter um adulto responsvel disponvel para acompanh-lo at [employer] a [place]. (Sugerimos que voc tenha algum disponvel para ajud-lo em casa aps a sua cirurgia).  You must have a responsible adult available to accompany you home after surgery. (We suggest that you have someone available to assist you at home after your surgery).    INSTRUES:   INSTRUCTIONS:  Voc no poder comer ou beber nada aps a meia-noite da noite anterior  cirurgia, nem mesmo gua, goma de mascar ou balas.   You may have nothing to eat or drink after midnight the night before your surgery, not even water, gum or hard candy.    Voc no poder fumar na manh do dia da sua cirurgia.  You may not smoke the morning of your surgery.    Por favor, deixe em casa todos os objetos de valor, incluindo joias, relgios, dinheiro, etc.  Please leave all valuables at home, including jewelry, watches, money, etc.    Remova qualquer esmalte das unhas antes de chegar ao [facility] e no use qualquer maquiagem ou batom.  Please remove all fingernail polish before arriving at [facility] and do not  wear any face or lip make-up.    Se for fazer cirurgia ocular, no aplique maquiagem nos olhos ou no rosto e evite hidratantes faciais e perfumes.  If you are having eye surgery, do not wear any eye or face makeup and avoid facial moisturizers and perfumes.    Por favor, retire quaisquer extenses de cabelo que possam ser [employer].  Please take out any hair extensions that can be removed.    No raspe os pelos da rea da cirurgia.  Do not shave surgical site.    No use lentes de contato.  Do not wear contact  lenses.    MEDICAMENTOS:   MEDICATIONS:     Tome a medicao a seguir na noite anterior  cirurgia, na hora de dormir: usual medicines   Take the following medication the night before surgery at bedtime:     [place] a seguir na manh do dia da sua cirurgia, com apenas um gole de gua: usual morning medicines except,  no chlorthalidone until after surgery  Take the following medication the morning of your surgery with only a sip of water:

## 2022-02-27 NOTE — Surgery Pre-Op (Signed)
PAT Visit          Sara Snyder is a 66 year old female received a preoperative screening.        Appointments for Next 30 Days 02/27/2022 - 03/29/2022        Date Visit Type Department Provider     03/04/2022 12:30 PM TELEVISIT 30 [facility] - [facility] - Nutrition Reginold Agent    Patient Instructions:     You have a video televisit appointment with your [facility] provider within the next 7 days. You will receive a notification about this the day before your appointment.  Thirty minutes before your appointment you will receive a link to your video visit in [employer] and via text and email before your scheduled appointment time.  The link will walk you through steps to make sure you are set up to connect with your provider at the time of the visit and give you access to the video visit.  If you have trouble connecting with your provider, they will call you at the time of your visit at the phone number your clinic has on file.                  03/06/2022  1:30 PM OFF30 [facility] Cardiology - [facility] - Inpatient Risser, Maisie Fus, MD    Patient Instructions:                  03/11/2022  9:45 AM POST OP [facility] Patton Salles, MD    Patient Instructions:                  03/19/2022  9:30 AM POST OP 1 MONTH [facility] [place], [place], [place]    Patient Instructions:                  03/23/2022  9:30 AM OFF15 [facility] Dermatology - [facility] - Kmi Phelan, Weston Brass, MD    Patient Instructions:                       Surgery Information       Future Procedures (Tomorrow to 02/27/2023)         Date Time Visit Type/Procedure Providers Location Status        03/10/2022 1015 CATARACT SURGERY WITH PC IOL (PHACO) - Left Patton Salles [facility] OR Scheduled      Visit Type/Procedure: CATARACT SURGERY WITH PC IOL [name]) - Left                          Procedure(s):  CATARACT SURGERY WITH PC IOL (PHACO)  03/10/2022        .     Language of care:Portuguese  ([place])    Allergy History  Pollen Extract      Past Medical History:  09/11/2018: Chronic kidney disease, stage III (moderate) (HCC)  08/14/2019: COVID-19      Comment:  Risk category:   high  Date of symptom onset: 08/12/19                 Tested? Positive  Risk factors: CKD, BMI > 30, HTN                 Clinical Course First date of symptoms:  08/12/19                 08/15/19: (DOI 3): Pt states  she is feeling well. Slight                ST and sinus congestion. No cough, fever, SOB, DOE or                edema. F/U in 3 days.  08/18/2019: DOI 7- Feeling better                over all, stable vitals, CM f/u in 2 days. 08/20/19 DOI 9-               stable sx; o2 ex  No date: HTN (hypertension)  01/05/2018: Mild episode of recurrent major depressive disorder (HCC)  02/20/2021: Osteopenia  11/29/2017: Panic disorder  No date: Wears eyeglasses     has a past surgical history that includes OB ANTEPARTUM CARE C DLVR&POSTPARTUM; Hemorrhoid surgery (10/2016); and CATARACT REMOVAL INSERTION OF LENS (Right).         Alcohol, Tobacco and Drug History:  Social History    Tobacco Use      Smoking status: Former        Packs/day: 2.00        Years: 25.00        Pack years: 50        Types: Cigarettes        Quit date: 10/27/1977        Years since quitting: 44.3      Smokeless tobacco: Never    E-Cigarette/Vaping  E-Cigarette Use: Never User  Quit Date:   Nicotine:   Start Date:   THC:    Cartridges/Day:   CBD:   Other Substance:   Flavoring:       Alcohol use   Not Currently       Drug use:   Unknown       Extended Emergency Contact Information  Primary Emergency Contact: Jolinda Croak, [place] [address] [place]  Home Phone: [phone]  Relation: Daughter  Secondary Emergency Contact: Chanie, Soucek  Mobile Phone: [phone]  Relation: Husband  Preferred language: [place] ([place])  Interpreter needed? Yes        Current Outpatient Medications   Medication Sig    omeprazole (PRILOSEC) 20 MG  capsule Take 1 capsule by mouth in the morning.    venlafaxine (EFFEXOR-XR) 150 MG 24 hr capsule TAKE 1 CAPSULE BY MOUTH DAILY    prednisoLONE acetate (PRED FORTE) 1 % ophthalmic suspension Place 1 drop into the left eye in the morning and 1 drop at noon and 1 drop in the evening and 1 drop before bedtime.    dulaglutide (TRULICITY) 0.75 MG/0.5ML injection Inject 0.75 mg under the skin once a week    diclofenac (VOLTAREN) 1 % GEL Gel Apply 2 g topically 4 (four) times daily as needed    clonazePAM (KLONOPIN) 1 MG tablet TAKE 1 TABLET BY MOUTH TWICE DAILY AS NEEDED    amlodipine-valsartan (EXFORGE) 5-320 MG per tablet Take 1 tablet by mouth in the morning.    calcium citrate-citamin D (CALCIUM CITRATE +) 315-5 MG-MCG tablet Take 1 tablet by mouth in the morning and 1 tablet before bedtime.    latanoprost (XALATAN) 0.005 % ophthalmic solution Place 1 drop into both eyes nightly    atenolol (TENORMIN) 100 MG tablet Take 1 tablet by mouth nightly    loratadine (CLARITIN) 10 MG tablet Take 1 tablet by mouth in the morning.    meclizine (ANTIVERT)  25 MG TABS Take 1 tablet by mouth every 8 (eight) hours as needed    chlorthalidone (HYGROTEN) 25 MG tablet Take 1 tablet by mouth in the morning.    Elastic Bandages & Supports (RA WRIST BRACE ADJ RIGHT L/XL) MISC Use as able for carpal tunnel syndrome    cholecalciferol (VITAMIN D3) 2000 UNIT tablet Take 1 tablet by mouth in the morning and 1 tablet before bedtime.    montelukast (SINGULAIR) 10 MG tablet Take 1 tablet by mouth nightly    acetaminophen (TYLENOL) 500 MG tablet Take 1 tablet by mouth every 4 (four) hours as needed for Pain    levothyroxine (SYNTHROID) 50 MCG tablet Take 1 tablet by mouth every morning before breakfast     No current facility-administered medications for this encounter.         (Not in a hospital admission)      PAT Assessment    Airways symptoms: DENTURES/PARTIAL: Yes    Airways WDL     Activity tolerance (How many flights of stairs): no sob or  cp with stairs      Assistive Device: None           PAT Screening     Row Name 02/27/22 1338       Integumentary - Complete full head to toe skin assessment    Integumentary (WDL) WDL    Row Name 02/27/22 1340       STOP-Bang Questionaire     Do you snore loudly (loud enough to be heard through closed doors or   your bed-partner elbows you for snoring at night? 0    Do you often feel Tired, fatigued, or Sleepy during the daytime (such as   falling asleep when driving)? 0    Has anyone observed you stop breathing, or choking/gasping during your   sleep? 0    Do you have, or are being treated for, High Blood Pressure? 0    Height 4\' 10"  (1.473 m)    Weight 88.9 kg (196 lb)    BMI (Calculated) 41.05    Neck size large? 0    Stop-Bang Score Low                       02/27/22  1340   Weight: 88.9 kg (196 lb)   Height: 4\' 10"  (1.473 m)           Patient issues currently are         Phone screen interview completed with patient, all meds and pre op instructions reviewed with expressed understanding.       Donalda Ewings RN.

## 2022-03-01 LAB — STOOL DNA: NONINV COLON CA DNA + OCC BLD SCREEN STL -IMP: NEGATIVE

## 2022-03-03 ENCOUNTER — Encounter (HOSPITAL_BASED_OUTPATIENT_CLINIC_OR_DEPARTMENT_OTHER): Payer: Self-pay

## 2022-03-03 DIAGNOSIS — R1013 Epigastric pain: Secondary | ICD-10-CM

## 2022-03-03 DIAGNOSIS — E669 Obesity, unspecified: Secondary | ICD-10-CM

## 2022-03-03 DIAGNOSIS — R140 Abdominal distension (gaseous): Secondary | ICD-10-CM

## 2022-03-03 DIAGNOSIS — I10 Essential (primary) hypertension: Secondary | ICD-10-CM

## 2022-03-03 DIAGNOSIS — K219 Gastro-esophageal reflux disease without esophagitis: Secondary | ICD-10-CM

## 2022-03-04 ENCOUNTER — Ambulatory Visit: Payer: No Typology Code available for payment source

## 2022-03-04 DIAGNOSIS — R7303 Prediabetes: Secondary | ICD-10-CM | POA: Diagnosis present

## 2022-03-04 DIAGNOSIS — E669 Obesity, unspecified: Secondary | ICD-10-CM | POA: Diagnosis present

## 2022-03-04 MED FILL — ATENOLOL 100MG: 90 days supply | Qty: 90 | Fill #1

## 2022-03-04 MED FILL — CHLORTHALID 25MG: 90 days supply | Qty: 90 | Fill #0

## 2022-03-04 NOTE — Progress Notes (Signed)
NUTRITION RE-ASSESSMENT / INTERVENTION    Total Minutes: 27 minutes    Home Clinic:  [address] / [place] [address]  [phone]    Sara Snyder is a 66 year old female who is at nutrition visit for re-assessment for obesity III.  Visit via [place] interpreter, [phone], via phone. Patient's language is [place].      Subjective (including lifestyle changes): Patient reports   -"I'm practically not eating anything at all."    -Feeling very uncomfortable w/ "this whole situation"   -Learned she was prediabetic last week   -Skipping breakfast, not eating any snacks     -"I am feeling so weak, it's like I cannot even think straight. They were supposed to send a list of foods that I can eat but they did not. So I'm not eating breakfast. I'm sleeping until noon, I wake up and have a spoon of rice. I skip dinner as well. I'm very worried because everything that I think that I might eat is sweet or contains sugar."   -Feeling dizzy at times   -Used to eat sweet things every morning with breakfast          No family history on file.      Current Outpatient Medications   Medication Sig    omeprazole (PRILOSEC) 20 MG capsule Take 1 capsule by mouth in the morning.    venlafaxine (EFFEXOR-XR) 150 MG 24 hr capsule TAKE 1 CAPSULE BY MOUTH DAILY    prednisoLONE acetate (PRED FORTE) 1 % ophthalmic suspension Place 1 drop into the left eye in the morning and 1 drop at noon and 1 drop in the evening and 1 drop before bedtime.    dulaglutide (TRULICITY) 0.75 MG/0.5ML injection Inject 0.75 mg under the skin once a week    diclofenac (VOLTAREN) 1 % GEL Gel Apply 2 g topically 4 (four) times daily as needed    clonazePAM (KLONOPIN) 1 MG tablet TAKE 1 TABLET BY MOUTH TWICE DAILY AS NEEDED    amlodipine-valsartan (EXFORGE) 5-320 MG per tablet Take 1 tablet by mouth in the morning.    calcium citrate-citamin D (CALCIUM CITRATE +) 315-5 MG-MCG tablet Take 1 tablet by mouth in the morning and 1 tablet before bedtime.    latanoprost  (XALATAN) 0.005 % ophthalmic solution Place 1 drop into both eyes nightly    atenolol (TENORMIN) 100 MG tablet Take 1 tablet by mouth nightly    loratadine (CLARITIN) 10 MG tablet Take 1 tablet by mouth in the morning.    meclizine (ANTIVERT) 25 MG TABS Take 1 tablet by mouth every 8 (eight) hours as needed    chlorthalidone (HYGROTEN) 25 MG tablet Take 1 tablet by mouth in the morning.    Elastic Bandages & Supports (RA WRIST BRACE ADJ RIGHT L/XL) MISC Use as able for carpal tunnel syndrome    cholecalciferol (VITAMIN D3) 2000 UNIT tablet Take 1 tablet by mouth in the morning and 1 tablet before bedtime.    montelukast (SINGULAIR) 10 MG tablet Take 1 tablet by mouth nightly    acetaminophen (TYLENOL) 500 MG tablet Take 1 tablet by mouth every 4 (four) hours as needed for Pain    levothyroxine (SYNTHROID) 50 MCG tablet Take 1 tablet by mouth every morning before breakfast     No current facility-administered medications for this visit.       Vitamins/Minerals/Herbal Supplements: Vit D & Ca      Social History    Tobacco Use  Smoking status: Former        Packs/day: 2.00        Years: 25.00        Pack years: 50        Types: Cigarettes        Quit date: 10/27/1977        Years since quitting: 44.3      Smokeless tobacco: Never    Alcohol use: Not Currently      Review of Patient's Allergies indicates:   Pollen extract          Cough    Labs reviewed: Yes     Vitals:  Most Recent BP Reading(s)  02/24/22 : 120/75          Most Recent Height Reading(s)  02/27/22 : 4\' 10"  (1.473 m)          Most Recent Weight Reading(s)  02/27/22 : 88.9 kg (196 lb)       Weight change: No weight change.    Estimated body mass index is 40.96 kg/m as calculated from the following:    Height as of 02/27/22: 4\' 10"  (1.473 m).    Weight as of 02/27/22: 88.9 kg (196 lb).  BMI Category: 35-39.9 obesity class II     Physical Activity:  Type: not physically active     Barriers to physical activity include: physical    Meal sources: home cooked  and convenience foods  Psychosocial factors / Mental Status: interactive with questions and anxious  Stress: High  Readiness to learn: Good    Assessment/Plan/Conclusion:  Discussed with patient "recent" prediabetes dx - per EMR pt's A1C has been in prediabetic range since 2019. I had reviewed w/ pt A1C levels at last assessment and discussed risks and strategies to reduce A1C. Unclear why pt is stating she just learned about it recently.  -Reviewed prediabetes MNT w/ pt - she has been severely restricting her intake and states she has no idea what to eat. Discussed relation of pre-DM recommendations w/ dietary guidelines discussed at last visit. Reviewed how decreased sugar/CHO intake can help improve BG levels   -She's feeling dizzy and skipping meals. Discussed importance of having consistent meals, what to do if she feels symptoms of low blood sugar, and proper meal planning.     Barriers to dietary changes include: comprehension level questionable    Client's goals:   -Do not skip meals  -Eat a protein & fruit w/ breakfast    Material provided:  Diabetes diet guidelines     Follow-up: 2 months.    Tahiri Shareef H Tajanae Guilbault

## 2022-03-05 ENCOUNTER — Encounter (HOSPITAL_BASED_OUTPATIENT_CLINIC_OR_DEPARTMENT_OTHER): Payer: Self-pay | Admitting: Ophthalmology

## 2022-03-05 MED FILL — TRULICITY INJ 0.75/0.5: 28 days supply | Qty: 4 | Fill #0

## 2022-03-06 ENCOUNTER — Ambulatory Visit (HOSPITAL_BASED_OUTPATIENT_CLINIC_OR_DEPARTMENT_OTHER): Payer: Self-pay | Admitting: Internal Medicine

## 2022-03-09 MED FILL — VENLAFAXINE 150MG ER: 30 days supply | Qty: 30 | Fill #1

## 2022-03-10 ENCOUNTER — Encounter (HOSPITAL_BASED_OUTPATIENT_CLINIC_OR_DEPARTMENT_OTHER)
Admission: RE | Disposition: A | Discharge status: Home / Self Care | Payer: Self-pay | Source: Ambulatory Visit | Attending: Ophthalmology

## 2022-03-10 ENCOUNTER — Other Ambulatory Visit: Payer: Self-pay

## 2022-03-10 ENCOUNTER — Encounter (HOSPITAL_BASED_OUTPATIENT_CLINIC_OR_DEPARTMENT_OTHER): Payer: Self-pay

## 2022-03-10 ENCOUNTER — Ambulatory Visit
Admission: RE | Admit: 2022-03-10 | Discharge: 2022-03-10 | Disposition: A | Discharge status: Home / Self Care | Payer: No Typology Code available for payment source | Attending: Ophthalmology | Admitting: Ophthalmology

## 2022-03-10 DIAGNOSIS — Z538 Procedure and treatment not carried out for other reasons: Secondary | ICD-10-CM | POA: Insufficient documentation

## 2022-03-10 DIAGNOSIS — H269 Unspecified cataract: Secondary | ICD-10-CM | POA: Insufficient documentation

## 2022-03-10 DIAGNOSIS — H25812 Combined forms of age-related cataract, left eye: Secondary | ICD-10-CM | POA: Insufficient documentation

## 2022-03-10 LAB — BLOOD SUGAR FINGERSTICK (POINT OF CARE): FINGERSTICK GLUCOSE: 86 mg/dl (ref 74–160)

## 2022-03-10 SURGERY — PHACOEMULSIFICATION, CATARACT, WITH POSTERIOR CHAMBER IOL INSERTION
Anesthesia: Monitor Anesthesia Care | Laterality: Left

## 2022-03-10 MED ORDER — EPINEPHRINE HCL 1 MG/ML INJECTION
Freq: Once | INTRAMUSCULAR | Status: DC
Start: 2022-03-10 — End: 2022-03-10
  Filled 2022-03-10: qty 0.5

## 2022-03-10 MED ORDER — PHENYLEPHRINE HCL 2.5 % OP SOLN
1.0000 [drp] | OPHTHALMIC | Status: AC
Start: 2022-03-10 — End: 2022-03-10
  Administered 2022-03-10 (×3): 1 [drp] via OPHTHALMIC

## 2022-03-10 MED ORDER — DEXAMETHASONE SODIUM PHOSPHATE 4 MG/ML IJ SOLN
INTRAMUSCULAR | Status: DC
Start: 2022-03-10 — End: 2022-03-10
  Filled 2022-03-10: qty 1

## 2022-03-10 MED ORDER — BUPIVACAINE HCL (PF) 0.75 % IJ SOLN
INTRAMUSCULAR | Status: DC
Start: 2022-03-10 — End: 2022-03-10
  Filled 2022-03-10: qty 10

## 2022-03-10 MED ORDER — TROPICAMIDE 1 % OP SOLN
1.0000 [drp] | OPHTHALMIC | Status: AC
Start: 2022-03-10 — End: 2022-03-10
  Administered 2022-03-10 (×3): 1 [drp] via OPHTHALMIC

## 2022-03-10 MED ORDER — TETRACAINE HCL 0.5 % OP SOLN
OPHTHALMIC | Status: DC
Start: 2022-03-10 — End: 2022-03-10
  Filled 2022-03-10: qty 4

## 2022-03-10 MED ORDER — CYCLOPENTOLATE HCL 1 % OP SOLN
1.0000 [drp] | OPHTHALMIC | Status: AC
Start: 2022-03-10 — End: 2022-03-10
  Administered 2022-03-10 (×3): 1 [drp] via OPHTHALMIC

## 2022-03-10 MED ORDER — LIDOCAINE HCL (PF) 1 % IJ SOLN
INTRAMUSCULAR | Status: DC
Start: 2022-03-10 — End: 2022-03-10
  Filled 2022-03-10: qty 2

## 2022-03-10 MED ORDER — MOXIFLOXACIN HCL 0.5 % OP SOLN
1.0000 [drp] | OPHTHALMIC | Status: AC
Start: 2022-03-10 — End: 2022-03-10
  Administered 2022-03-10 (×3): 1 [drp] via OPHTHALMIC

## 2022-03-10 MED ORDER — ACETYLCHOLINE CHLORIDE 20 MG IO SOLR
INTRAOCULAR | Status: DC
Start: 2022-03-10 — End: 2022-03-10
  Filled 2022-03-10: qty 2

## 2022-03-10 MED ORDER — CEFAZOLIN SODIUM 1 G IJ SOLR
INTRAMUSCULAR | Status: DC
Start: 2022-03-10 — End: 2022-03-10
  Filled 2022-03-10: qty 10

## 2022-03-10 MED ORDER — BSS IO SOLN
INTRAOCULAR | Status: DC
Start: 2022-03-10 — End: 2022-03-10
  Filled 2022-03-10: qty 15

## 2022-03-10 MED ORDER — FLURBIPROFEN SODIUM 0.03 % OP SOLN
1.0000 [drp] | OPHTHALMIC | Status: AC
Start: 2022-03-10 — End: 2022-03-10
  Administered 2022-03-10 (×3): 1 [drp] via OPHTHALMIC

## 2022-03-10 MED ORDER — ERYTHROMYCIN 5 MG/GM OP OINT
0.5000 [in_us] | TOPICAL_OINTMENT | Freq: Once | OPHTHALMIC | Status: DC
Start: 2022-03-10 — End: 2022-03-10

## 2022-03-10 MED ORDER — LIDOCAINE-EPINEPHRINE 2 %-1:200000 IJ SOLN
INTRAMUSCULAR | Status: DC
Start: 2022-03-10 — End: 2022-03-10
  Filled 2022-03-10: qty 10

## 2022-03-10 NOTE — H&P (Signed)
Case cancelled due to fact that patient took Trulitiy 3 days ago which inhibits gastric emptying.

## 2022-03-10 NOTE — Anesthesia Preprocedure Evaluation (Signed)
Pre-Anesthetic Note  .      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]    Location: [place] OR 6 / [place] OR    Surgeons: Charise Carwin, MD            Relevant Problems   PULMONARY   (+) DOE (dyspnea on exertion)      CARDIO   (+) Bilateral renal artery stenosis (HCC)   (+) DOE (dyspnea on exertion)   (+) Essential hypertension      GI   (+) Gastroesophageal reflux disease without esophagitis      GU/RENAL   (+) Stage 3a chronic kidney disease      ENDO   (+) Other specified hypothyroidism           Previous Anesthetic History:   Past Surgical History:  No date: CATARACT REMOVAL INSERTION OF LENS; Right  10/2016: HEMORRHOID SURGERY  No date: OB ANTEPARTUM CARE CESAREAN DLVR & POSTPARTUM        Medications  Current Facility-Administered Medications   Medication   . EPINEPHrine (ADRENALIN) 0.5 mg in balanced salts (BSS) 500 mL irrigation   . erythromycin [employer] [facility]) ophthalmic ointment 0.5 Inch   . lidocaine 2%-EPINEPHrine 1:200,000 2 %-1:200000 injection   . Acetylcholine Chloride (MIOCHOL-E) 20 MG intra-ocular injection   . dexamethasone (DECADRON) 4 MG/ML injection   . tetracaine (PONTOCAINE) 0.5 % ophthalmic solution   . ceFAZolin (ANCEF) 1 g injection   . bupivacaine (MARCAINE) 0.75 % injection   . lidocaine (PF) 1 % injection   . balanced salts (BSS) ophthalmic solution         Allergies:   Review of Patient's Allergies indicates:   Pollen extract          Cough    Smoking, Alcohol, Drugs:  Social History    Tobacco Use      Smoking status: Former        Packs/day: 2.00        Years: 25.00        Pack years: 50        Types: Cigarettes        Quit date: 10/27/1977        Years since quitting: 44.3      Smokeless tobacco: Never    Alcohol use: Not  Currently      Drug use:   Unknown       PMHx:  Past Medical History:  09/11/2018: Chronic kidney disease, stage III (moderate) ([place])  08/14/2019: COVID-19      Comment:  Risk category:   high  Date of symptom onset: 08/12/19                 Tested? Positive  Risk factors: CKD, BMI > 30, HTN                 Clinical Course First date of symptoms:  08/12/19                 08/15/19: (DOI 3): Pt states she is feeling well. Slight                ST and sinus congestion. No cough, fever,  SOB, DOE or                edema. F/U in 3 days.  08/18/2019: DOI 7- Feeling better                over all, stable vitals, CM f/u in 2 days. 08/20/19 DOI 9-               stable sx; o2 ex  No date: HTN (hypertension)  01/05/2018: Mild episode of recurrent major depressive disorder ([place])  02/20/2021: Osteopenia  11/29/2017: Panic disorder  No date: Wears eyeglasses    Vitals  BP 158/64   Pulse 68   Temp 97.6 F (36.4 C) (Temporal)   Resp 20   Ht 4' 11.84" (1.52 m)   Wt 87.5 kg (193 lb)   SpO2 98%   BMI 37.89 kg/m     Review of Systems     Patient summary reviewed      Anesthetic History:   negative anesthesia history ROS           Cardiovascular: Negative negative for cardiovascular disease.    Physical Activity in METs greater than 4    Pulmonary: Negative. negative for Pulmonary Disease  GU/Renal: Negative for GU/renal diseases.    Hepatic: Skin negative for hepatic disease.    Neurological: Negative for neurological diseases.    Gastrointestinal:  Positive for GERD (no symptoms today, usually takes PPI daily, took trulicity within 48 hours).        Burping during preop eval, had full meal last night   Hematological: Negative for hematological diseases.    Endocrine: Negative for endocrine diseases/disorders.    HEENT: Negative for HEENT disorders.    Musculoskeletal: Negative for musculoskeletal diseases.    Psychiatric: Negative for psychiatry diseases.     Constitutional: Negative for constitutional diseases.    Skin: Negative for  skin diseases.      Physical Exam    General     Level of consciousness:  Alert   BMI   BMI greater than 30 kg/m2   Airway     Mallampati:  III    TM distance:  >3 FB    Mouth opening:  >3 FB    Neck ROM:  Full   Teeth    (+) upper dentures, lower dentures  }   Heart   - normal exam     Lungs - normal exam     Other findings: Left lower dentures at home                Pertinent Labs:   Lab Results   Component Value Date    NA 139 10/22/2021    K 4.0 10/22/2021    CREAT 1.2 10/22/2021    GLUCOSER 103 10/22/2021    WBC 5.5 07/14/2021    HCT 34.5 07/14/2021    PLTA 303 07/14/2021         Anesthesia Plan    ASA Score:     ASA:  3    Airway:      Mallampati:  III    Mouth opening:  >3 FB    Neck ROM:  Full    TM distance:  >3 FB     Plan: MAC    Other information:     EKG Reviewed: : No      Full Stomach Precaution:: Yes      Post-Plan::  PACU    Informed Consent:     Anesthetic plan  and risks discussed with:  Patient   Patient Consented        Attending Anesthesiologist Statement:     Reassessed day of surgery: Yes        Assessment made, necessary equipment and appropriate plan in place.

## 2022-03-11 ENCOUNTER — Ambulatory Visit (HOSPITAL_BASED_OUTPATIENT_CLINIC_OR_DEPARTMENT_OTHER): Payer: No Typology Code available for payment source | Admitting: Ophthalmology

## 2022-03-11 ENCOUNTER — Encounter (HOSPITAL_BASED_OUTPATIENT_CLINIC_OR_DEPARTMENT_OTHER): Payer: Self-pay

## 2022-03-17 ENCOUNTER — Encounter (HOSPITAL_BASED_OUTPATIENT_CLINIC_OR_DEPARTMENT_OTHER): Payer: Self-pay | Admitting: Ophthalmology

## 2022-03-18 ENCOUNTER — Encounter (HOSPITAL_BASED_OUTPATIENT_CLINIC_OR_DEPARTMENT_OTHER): Payer: Self-pay

## 2022-03-18 ENCOUNTER — Ambulatory Visit
Admission: RE | Admit: 2022-03-18 | Discharge: 2022-03-18 | Disposition: A | Discharge status: Home / Self Care | Payer: No Typology Code available for payment source

## 2022-03-18 ENCOUNTER — Other Ambulatory Visit: Payer: Self-pay

## 2022-03-18 HISTORY — DX: Personal history of other diseases of the circulatory system: Z86.79

## 2022-03-18 NOTE — Discharge Instructions (Addendum)
INSTRUES DO PR-OPERATRIO PARA O SURGICAL DAY CARE   SURGICAL DAY CARE PRE-OPERATIVE INSTRUCTIONS    DIA DA CIRURGIA  DAY OF SURGERY    Chegar em: [facility] em Quarta (Wednesday), Laurence Spates (October) 4 s (Time): 1030 am .   Arrive at:  Registration on (Day of the Week, Month, Day, at (Time).        obrigatrio ter um adulto responsvel disponvel para acompanh-lo at [employer] a [place]. (Sugerimos que voc tenha algum disponvel para ajud-lo em casa aps a sua cirurgia).  You must have a responsible adult available to accompany you home after surgery. (We suggest that you have someone available to assist you at home after your surgery).    INSTRUES:   INSTRUCTIONS:  Voc no poder comer ou beber nada aps a meia-noite da noite anterior  cirurgia, nem mesmo gua, goma de mascar ou balas.   You may have nothing to eat or drink after midnight the night before your surgery, not even water, gum or hard candy.    Voc no poder fumar na manh do dia da sua cirurgia.  You may not smoke the morning of your surgery.    Por favor, deixe em casa todos os objetos de valor, incluindo joias, relgios, dinheiro, etc.  Please leave all valuables at home, including jewelry, watches, money, etc.    Remova qualquer esmalte das unhas antes de chegar ao Surgical Day Care e no use qualquer maquiagem ou batom.  Please remove all fingernail polish before arriving at Surgical Day Care and do not  wear any face or lip make-up.    Se for fazer cirurgia ocular, no aplique maquiagem nos olhos ou no rosto e evite hidratantes faciais e perfumes.  If you are having eye surgery, do not wear any eye or face makeup and avoid facial moisturizers and perfumes.    Por favor, retire quaisquer extenses de cabelo que possam ser [employer].  Please take out any hair extensions that can be removed.    No raspe os pelos da rea da cirurgia.  Do not shave surgical site.    No use lentes de contato.  Do not wear contact  lenses.    MEDICAMENTOS:   MEDICATIONS:     Jomarie Longs a medicao a seguir na noite anterior  cirurgia, na hora de dormir: usual meds    Take the following medication the night before surgery at bedtime:     [place] a seguir na manh do dia da sua cirurgia, com apenas um gole de gua:  NO HYGROTEN  ...Marland KitchenMarland KitchenTAKE OTHER MEDS WITH SIP OF WATER   Take the following medication the morning of your surgery with only a sip of water:

## 2022-03-18 NOTE — Surgery Pre-Op (Signed)
PAT Visit          Sara Snyder is a 66 year old female received a preoperative screening.        Appointments for Next 30 Days 03/18/2022 - 04/17/2022        Date Visit Type Department Provider     03/18/2022  8:30 AM PRE-ADMISSION [facility] PAT RN    Patient Instructions:     On this date, you will receive a phone call from a [facility] nurse to discuss your upcoming procedure. You do not need to come to the hospital for this appointment                 03/23/2022  9:30 AM OFF15 [facility] Dermat[facility] - [facility] [place], Livingston Diones, MD    Patient Instructions:                  03/26/2022 10:00 AM POST OP [place] Charise Carwin, MD    Patient Instructions:                  04/01/2022 12:30 PM TELEVISIT 30 [place]    Patient Instructions:     You have a video televisit appointment with your [facility] provider within the next 7 days. You will receive a notification about this the day before your appointment.  Thirty minutes before your appointment you will receive a link to your video visit in [place] and via text and email before your scheduled appointment time.  The link will walk you through steps to make sure you are set up to connect with your provider at the time of the visit and give you access to the video visit.  If you have trouble connecting with your provider, they will call you at the time of your visit at the phone number your clinic has on file.                  04/03/2022  9:15 AM POST OP [facility] Charise Carwin, MD    Patient Instructions:                  04/08/2022  9:00 AM OFF30 [facility] Risser, Marcello Moores, MD    Patient Instructions:                       Surgery Information       Future Procedures (Tomorrow to 03/18/2023)         Date Time Visit Type/Procedure Providers Location Status         03/25/2022 1230 PHACOEMULSIFICATION, CATARACT, WITH POSTERIOR CHAMBER IOL INSERTION - Left Charise Carwin [facility] OR Scheduled      Visit Type/Procedure: PHACOEMULSIFICATION, CATARACT, WITH POSTERIOR CHAMBER IOL INSERTION - Left                          Procedure(s):  PHACOEMULSIFICATION, CATARACT, WITH POSTERIOR CHAMBER IOL INSERTION  03/25/2022        .     Language of care:Portuguese ([place])    Allergy History  Pollen Extract      Past Medical History:  09/11/2018: Chronic kidney disease, stage III (moderate) ([place])  08/14/2019: COVID-19      Comment:  Risk category:  high  Date of symptom onset: 08/12/19                 Tested? Positive  Risk factors: CKD, BMI > 30, HTN                 Clinical Course First date of symptoms:  08/12/19                 08/15/19: (DOI 3): Pt states she is feeling well. Slight                ST and sinus congestion. No cough, fever, SOB, DOE or                edema. F/U in 3 days.  08/18/2019: DOI 7- Feeling better                over all, stable vitals, CM f/u in 2 days. 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/2018: Mild episode of recurrent major depressive disorder ([place])  02/20/2021: Osteopenia  11/29/2017: Panic disorder  No date: Wears eyeglasses     has a past surgical history that includes OB ANTEPARTUM CARE C DLVR&POSTPARTUM; Hemorrhoid surgery (10/2016); and CATARACT REMOVAL INSERTION OF LENS (Right).         Alcohol, Tobacco and Drug History:  Social History    Tobacco Use      Smoking status: Former        Packs/day: 2.00        Years: 25.00        Pack years: 73        Types: Cigarettes        Quit date: 10/27/1977        Years since quitting: 44.4      Smokeless tobacco: Never    E-Cigarette/Vaping  E-Cigarette Use: Never User  Quit Date:   Nicotine:   Start Date:   THC:    Cartridges/Day:   CBD:   Other Substance:   Flavoring:       Alcohol use   Not Currently       Drug use:   Unknown        Extended Emergency Contact Information  Primary Emergency Contact: Tonita Phoenix, [place] [address] Johnnette Litter of [place] Phone: [phone]  Relation: Daughter  Secondary Emergency Contact: Nor[place], [place]  Mobile Phone: [phone]  Relation: Husband  Preferred language: [place] ([place]  Interpreter needed? Yes        Current Outpatient Medications   Medication Sig    omeprazole (PRILOSEC) 20 MG capsule Take 1 capsule by mouth in the morning.    venlafaxine (EFFEXOR-XR) 150 MG 24 hr capsule TAKE 1 CAPSULE BY MOUTH DAILY    dulaglutide (TRULICITY) A999333 0000000 injection Inject 0.75 mg under the skin once a week    diclofenac (VOLTAREN) 1 % GEL Gel Apply 2 g topically 4 (four) times daily as needed    clonazePAM (KLONOPIN) 1 MG tablet TAKE 1 TABLET BY MOUTH TWICE DAILY AS NEEDED    amlodipine-valsartan (EXFORGE) 5-320 MG per tablet Take 1 tablet by mouth in the morning.    calcium citrate-citamin D (CALCIUM CITRATE +) 315-5 MG-MCG tablet Take 1 tablet by mouth in the morning and 1 tablet before bedtime.    latanoprost (  XALATAN) 0.005 % ophthalmic solution Place 1 drop into both eyes nightly    atenolol (TENORMIN) 100 MG tablet Take 1 tablet by mouth nightly    loratadine (CLARITIN) 10 MG tablet Take 1 tablet by mouth in the morning.    meclizine (ANTIVERT) 25 MG TABS Take 1 tablet by mouth every 8 (eight) hours as needed    chlorthalidone (HYGROTEN) 25 MG tablet Take 1 tablet by mouth in the morning.    Elastic Bandages & Supports (RA WRIST BRACE ADJ RIGHT L/XL) MISC Use as able for carpal tunnel syndrome    cholecalciferol (VITAMIN D3) 2000 UNIT tablet Take 1 tablet by mouth in the morning and 1 tablet before bedtime.    acetaminophen (TYLENOL) 500 MG tablet Take 1 tablet by mouth every 4 (four) hours as needed for Pain    levothyroxine (SYNTHROID) 50 MCG tablet Take 1 tablet by mouth every morning before breakfast     No current facility-administered medications for this  encounter.         (Not in a hospital admission)      PAT Assessment    Airways symptoms: DENTURES/PARTIAL: Yes    Activity tolerance (How many flights of stairs): TIRED ON 2    Assistive Device: None           PAT Screening     Row Name 03/18/22 0753       Integumentary - Complete full head to toe skin assessment    Integumentary (WDL) WDL    Row Name 03/18/22 0753       STOP-Bang Questionaire     Do you snore loudly (loud enough to be heard through closed doors or   your bed-partner elbows you for snoring at night? 0    Do you often feel Tired, fatigued, or Sleepy during the daytime (such as   falling asleep when driving)? 0    Has anyone observed you stop breathing, or choking/gasping during your   sleep? 0    Do you have, or are being treated for, High Blood Pressure? 1    Height 4\' 11"  (1.499 m)    Weight 87.5 kg (193 lb)    BMI (Calculated) 39.06    Neck size large? 0    Stop-Bang Score Intermediate                       03/18/22  0753   Weight: 87.5 kg (193 lb)   Height: 4\' 11"  (1.499 m)           Patient issues currently are  PRE OP AND ALL QUESTIONS ANSWERED .Trulicity has been held since sept 13th Dtr with pt for screening and int used .  Medication instructions were given. AVS given to patient.   CHX wipes instructions were given and reviewed.       Gwendel Hanson, RN

## 2022-03-19 ENCOUNTER — Ambulatory Visit (HOSPITAL_BASED_OUTPATIENT_CLINIC_OR_DEPARTMENT_OTHER): Payer: No Typology Code available for payment source | Admitting: Optometrist

## 2022-03-23 ENCOUNTER — Encounter (HOSPITAL_BASED_OUTPATIENT_CLINIC_OR_DEPARTMENT_OTHER): Payer: No Typology Code available for payment source | Admitting: Dermatology

## 2022-03-25 ENCOUNTER — Other Ambulatory Visit: Payer: Self-pay

## 2022-03-25 ENCOUNTER — Ambulatory Visit
Admission: RE | Admit: 2022-03-25 | Discharge: 2022-03-25 | Disposition: A | Discharge status: Home / Self Care | Payer: No Typology Code available for payment source | Attending: Ophthalmology | Admitting: Ophthalmology

## 2022-03-25 ENCOUNTER — Ambulatory Visit (HOSPITAL_BASED_OUTPATIENT_CLINIC_OR_DEPARTMENT_OTHER): Payer: No Typology Code available for payment source | Admitting: Anesthesiology

## 2022-03-25 ENCOUNTER — Encounter (HOSPITAL_BASED_OUTPATIENT_CLINIC_OR_DEPARTMENT_OTHER)
Admission: RE | Disposition: A | Discharge status: Home / Self Care | Payer: Self-pay | Source: Ambulatory Visit | Attending: Ophthalmology

## 2022-03-25 DIAGNOSIS — E038 Other specified hypothyroidism: Secondary | ICD-10-CM | POA: Insufficient documentation

## 2022-03-25 DIAGNOSIS — Z7985 Long-term (current) use of injectable non-insulin antidiabetic drugs: Secondary | ICD-10-CM | POA: Diagnosis not present

## 2022-03-25 DIAGNOSIS — Z79899 Other long term (current) drug therapy: Secondary | ICD-10-CM | POA: Diagnosis not present

## 2022-03-25 DIAGNOSIS — H25012 Cortical age-related cataract, left eye: Secondary | ICD-10-CM | POA: Diagnosis present

## 2022-03-25 DIAGNOSIS — H25812 Combined forms of age-related cataract, left eye: Secondary | ICD-10-CM

## 2022-03-25 DIAGNOSIS — N1831 Chronic kidney disease, stage 3a: Secondary | ICD-10-CM | POA: Insufficient documentation

## 2022-03-25 DIAGNOSIS — I129 Hypertensive chronic kidney disease with stage 1 through stage 4 chronic kidney disease, or unspecified chronic kidney disease: Secondary | ICD-10-CM | POA: Insufficient documentation

## 2022-03-25 DIAGNOSIS — K219 Gastro-esophageal reflux disease without esophagitis: Secondary | ICD-10-CM | POA: Diagnosis not present

## 2022-03-25 DIAGNOSIS — Z87891 Personal history of nicotine dependence: Secondary | ICD-10-CM | POA: Diagnosis not present

## 2022-03-25 DIAGNOSIS — H2181 Floppy iris syndrome: Secondary | ICD-10-CM | POA: Insufficient documentation

## 2022-03-25 SURGERY — PHACOEMULSIFICATION, CATARACT, WITH POSTERIOR CHAMBER IOL INSERTION
Anesthesia: Monitor Anesthesia Care | Site: Eye | Laterality: Left | Wound class: Class I/ Clean

## 2022-03-25 MED ORDER — LIDOCAINE-EPINEPHRINE 2 %-1:200000 IJ SOLN
INTRAMUSCULAR | Status: DC
Start: 2022-03-25 — End: 2022-03-25
  Filled 2022-03-25: qty 10

## 2022-03-25 MED ORDER — BSS IO SOLN
Freq: Once | INTRAOCULAR | Status: AC
Start: 2022-03-25 — End: 2022-03-25
  Filled 2022-03-25: qty 0.5

## 2022-03-25 MED ORDER — ACETAMINOPHEN 325 MG PO TABS
650.0000 mg | ORAL_TABLET | Freq: Four times a day (QID) | ORAL | Status: DC | PRN
Start: 2022-03-25 — End: 2022-03-25

## 2022-03-25 MED ORDER — DEXAMETHASONE SODIUM PHOSPHATE 4 MG/ML IJ SOLN
INTRAMUSCULAR | Status: AC
Start: 2022-03-25 — End: 2022-03-25
  Administered 2022-03-25: 2 mg
  Filled 2022-03-25: qty 1

## 2022-03-25 MED ORDER — BUPIVACAINE HCL (PF) 0.75 % IJ SOLN
INTRAMUSCULAR | Status: DC
Start: 2022-03-25 — End: 2022-03-25
  Filled 2022-03-25: qty 10

## 2022-03-25 MED ORDER — CEFAZOLIN SODIUM 1 G IJ SOLR
INTRAMUSCULAR | Status: AC
Start: 2022-03-25 — End: 2022-03-25
  Administered 2022-03-25: 1 g via SUBCONJUNCTIVAL
  Filled 2022-03-25: qty 10

## 2022-03-25 MED ORDER — PREDNISOLONE ACETATE 1 % OP SUSP
1.0000 [drp] | Freq: Four times a day (QID) | OPHTHALMIC | Status: DC
Start: 2022-03-25 — End: 2022-03-25
  Administered 2022-03-25: 1 [drp] via OPHTHALMIC

## 2022-03-25 MED ORDER — TROPICAMIDE 1 % OP SOLN
1.0000 [drp] | OPHTHALMIC | Status: AC
Start: 2022-03-25 — End: 2022-03-25
  Administered 2022-03-25 (×3): 1 [drp] via OPHTHALMIC

## 2022-03-25 MED ORDER — LIDOCAINE HCL (PF) 1 % IJ SOLN
INTRAMUSCULAR | Status: AC
Start: 2022-03-25 — End: 2022-03-25
  Administered 2022-03-25: 1 mL via SUBCONJUNCTIVAL
  Filled 2022-03-25: qty 2

## 2022-03-25 MED ORDER — CYCLOPENTOLATE HCL 1 % OP SOLN
1.0000 [drp] | OPHTHALMIC | Status: AC
Start: 2022-03-25 — End: 2022-03-25
  Administered 2022-03-25 (×3): 1 [drp] via OPHTHALMIC

## 2022-03-25 MED ORDER — MOXIFLOXACIN HCL 0.5 % OP SOLN
1.0000 [drp] | OPHTHALMIC | Status: AC
Start: 2022-03-25 — End: 2022-03-25
  Administered 2022-03-25 (×4): 1 [drp] via OPHTHALMIC

## 2022-03-25 MED ORDER — PHENYLEPHRINE HCL 2.5 % OP SOLN
1.0000 [drp] | OPHTHALMIC | Status: AC
Start: 2022-03-25 — End: 2022-03-25
  Administered 2022-03-25 (×3): 1 [drp] via OPHTHALMIC

## 2022-03-25 MED ORDER — ACETYLCHOLINE CHLORIDE 20 MG IO SOLR
INTRAOCULAR | Status: DC
Start: 2022-03-25 — End: 2022-03-25
  Filled 2022-03-25: qty 2

## 2022-03-25 MED ORDER — FLURBIPROFEN SODIUM 0.03 % OP SOLN
1.0000 [drp] | OPHTHALMIC | Status: AC
Start: 2022-03-25 — End: 2022-03-25
  Administered 2022-03-25 (×3): 1 [drp] via OPHTHALMIC

## 2022-03-25 MED ORDER — BSS IO SOLN
INTRAOCULAR | Status: AC
Start: 2022-03-25 — End: 2022-03-25
  Administered 2022-03-25: 1
  Filled 2022-03-25: qty 15

## 2022-03-25 MED ORDER — TETRACAINE HCL 0.5 % OP SOLN
OPHTHALMIC | Status: AC
Start: 2022-03-25 — End: 2022-03-25
  Administered 2022-03-25: 1 via OPHTHALMIC
  Filled 2022-03-25: qty 4

## 2022-03-25 MED ORDER — ERYTHROMYCIN 5 MG/GM OP OINT
0.5000 [in_us] | TOPICAL_OINTMENT | Freq: Once | OPHTHALMIC | Status: DC
Start: 2022-03-25 — End: 2022-03-25
  Administered 2022-03-25: 0.5 [in_us] via OPHTHALMIC

## 2022-03-25 SURGICAL SUPPLY — 16 items
ALCON EYE PACK W/BSS SOLUTION (PACK) ×1 IMPLANT
ANTERIOR CHAMBER CANNULA 27GA (OPHTH) ×1 IMPLANT
BETADINE 5% PREP SOLUTION (PREP) ×1 IMPLANT
DRESSING SPONGE 4X4 (DRESSING) ×2 IMPLANT
EXTENSION SET MACROBORE (IVSETS) ×1 IMPLANT
I/A HANDPIECE 45D SILICONE (OPHINST) ×1 IMPLANT
I/O LENS +26.0 SN60WF ×1 IMPLANT
INSYTE IV CATHETER 20GAX1.16IN (IVCATH) ×1 IMPLANT
MALYUGIN RING 7.0MM (OPHSURG) ×1 IMPLANT
MONARCH III IOL INSERTER CART (LENSACC) ×1 IMPLANT
NASAL CANNULA W/CO2 SAMPLING (CANNULA) ×1 IMPLANT
PACK EYE (PACK) ×1 IMPLANT
SLIT KNIFE 2.8MM ANGLD SGL BVL (OPHSURG) ×1 IMPLANT
STERILE LIGHTHANDLE COVER (SURGEQUP) ×2 IMPLANT
STERILE WATER IRR. 500ML (SOLUTION) ×1 IMPLANT
SURGEON GLOVE LF/PF 8 STER (GLOVE) ×1 IMPLANT

## 2022-03-25 NOTE — Discharge Instructions (Signed)
You need to be seen at the  [place] Office Tomorrow.  The office is located at [address], [place].  The phone number is [phone] if you have any questions.     Start taking Vigamox and Pred forte one drop of each in the eye that had surgery today  4 time per day starting TODAY after Dinner (around 6 PM)  .  Use both drops after lunch, dinner, before bed, and after breakfast.  Put the shield back over your eye between taking the drops and secure with a piece of tape    Call the office  [phone] if you have any problems of concerns.  The answering service will answer the call 24 hours per day.  Ask them to page Dr. Marquitta Persichetti

## 2022-03-25 NOTE — Op Note (Signed)
PREOPERATIVE DIAGNOSIS:    Cataract left eye.    POSTOPERATIVE DIAGNOSIS:        Same.    Date of Procedure-                                                      03/25/2022     OPERATION:                                                Complex Clear corneal cataract extraction with                                                                  posterior chamber intraocular lens                      model [id number]                                                                +26.0 diopters  left    eye. Requiring a Malyugin ring to control the flaccid iris and mechanically dilate the miotic pupil.    SURGEON: Wenda Low. Markeese Boyajian, MD    ANESTHESIA:                     Intracameral and topical    COMPLICATIONS:                           None.    After a Time Out to identify the correct operative eye, the left eye was prepped and draped in the usual manner appropriate for intraocular surgery.  The operating microscope was used for good surgical control at all times.   A paracentis was performed with a super blade.  Preservative free lidocaine was injected into the anterior chamber.  A viscoelastic material was then injected into the anterior chamber to deepen it.  A clear corneal temporal wound was created using a 2.8 mm keratome in a triplanar fashion approximately 1.5 mm from the limbus. Due to intraoperative floppy iris it was necessary to inject a Malyugin ring to control the flaccid iris and dilate the miotic pupil.A continuous curvilinear  capsulorrhexis was performed using a bent-needle cystotome and capsulorrhexis forceps.  Phacoemulsification of the nucleus was performed using a quartering-and-cracking technique. Automated irrigation/aspiration of the residual cortical material was then performed.  The intraocular lens was then injected into the capsular bag. The Malyugin ring was removed.Automated irrigation/aspiration of  residual viscoelastic material was then performed.  Balanced salt solution was used to  inflate the anterior chamber.  The wound was tested and found to be watertight.  A Subtenon injections with 50 mg Kefzol and 2 mg dexamethasone were administered inferiorly,  well away from the wound.  A drop of Vigamox was placed on the surface of the globe, as well of a ribbon of Ilotycin ointment.  A Sterile shield was placed over the eye and secured with paper tape.    The patient was returned to the ambulatory Day Surgery Unit in good conditions.  The patient was instructed to start taking Vigamox and Prednisolone eye drops in the post operative eye starting 4 hours after the surgery. The patient was instructed to put the shield back on over the eye after putting in the drops and to make sure it is secured with tape.    The patient was also instructed to follow up with Dr. Lonia Chimera the following day and to call the office and have him paged if there are any concerns at any time.

## 2022-03-25 NOTE — H&P (Signed)
Today I have discussed symptoms and complaints with the patient, reviewed the patient's vital signs, and repeated a focused examination of the patient. There are not any changes since the complete H&P which was already done by the patient's PCP, Dorita Sciara, MD , on 02/24/22,which I have reviewed today.    03/25/22  1056 03/25/22  1058 03/25/22  1110   BP: 153/68  132/83   Pulse: 79  79   Resp: 27  25   Temp: 97.5 F (36.4 C)     TempSrc: Temporal     SpO2: 97%  98%   Weight:  87.9 kg (193 lb 12.8 oz)    Height:  4' 11.5" (1.511 m)          The patient was in no apparent distress    HEENT-grossly wnl  Cor-S1S2  Lungs-Clear  Abdomen-soft, non tender  Extremities-no gross edema/swelling    ROS-no new complaints or symptoms  Medications reviewed  Allergies reviewed    The patient is medically stable to proceed with the planned ocular surgery today.

## 2022-03-25 NOTE — Nursing Note (Signed)
Patient tolerating po fluids 

## 2022-03-25 NOTE — Anesthesia Postprocedure Evaluation (Signed)
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 [H25.012])    Surgeons: Charise Carwin, MD Responsible Provider: Candice Camp, MD    Anesthesia Type: MAC ASA Status: 3              POST-OPERATIVE EVALUATION    Anesthesia Post Evaluation    Vitals signs in patient's normal range: Yes  Respiratory function stable; airway patent: Yes  Cardiovascular function stable: Yes  Hydration status stable: Yes  Mental status recovered; patient participates in evaluation and/or is at baseline: Yes  Pain control satisfactory: Yes  Nausea and vomiting control satisfactory: YesProcedure was labor & delivery no  PostOP disposition PACU  Anesthesia Observation no significant observation    Anesthesia MIPS:    The patient is a current smoker (G9642)(e.g. cigarette, cigar, pipe, e-cigarette/vaping/marijuana): No  Emergent - Exclusion case (R1540): No  Patient was administered multimodal pain management (two or more drugs and/or interventions excluding systemic opioids) in the perioperative period; occurring at some time between 6 hours prior to anesthesia start time until discharged from PACU (G8676): No  List medical reason(s) patient did not receive multimodal pain management (P9509): no pain anticipated postop  Anesthesia start to Anesthesia end time was 60 minutes or longer (4255F): No  Patient received an inhalational anesthetic (4554F): No  Pediatric patient?: No    e[employer]imet[employer] # [id number]          Last vitals    BP: 132/83 (03/25/2022 11:10 AM)  Temp: 97.5 F (36.4 C) (03/25/2022 10:56 AM)  Pulse: 79 (03/25/2022 11:10 AM)  Resp: 25 (03/25/2022 11:10 AM)  SpO2: 98 % (03/25/2022 11:10  AM)

## 2022-03-25 NOTE — Nursing Note (Signed)
Verbalized all discharge instructions via interpreter  Assisted to car with WC

## 2022-03-25 NOTE — Anesthesia Preprocedure Evaluation (Signed)
Pre-Anesthetic Note  .      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 [H25.012]    Location: [place] OR 6 / [place] OR    Surgeons: Charise Carwin, MD            Relevant Problems   PULMONARY   (+) DOE (dyspnea on exertion)      CARDIO   (+) Bilateral renal artery stenosis (HCC)   (+) DOE (dyspnea on exertion)   (+) Essential hypertension      GI   (+) Gastroesophageal reflux disease without esophagitis      GU/RENAL   (+) Stage 3a chronic kidney disease      ENDO   (+) Other specified hypothyroidism           Previous Anesthetic History:   Past Surgical History:  No date: CATARACT REMOVAL INSERTION OF LENS; Right  10/2016: HEMORRHOID SURGERY  No date: OB ANTEPARTUM CARE CESAREAN DLVR & POSTPARTUM        Medications  Current Facility-Administered Medications   Medication    EPINEPHrine (ADRENALIN) 0.5 mg in balanced salts (BSS) 500 mL irrigation    lidocaine 2%-EPINEPHrine 1:200,000 2 %-1:200000 injection    Acetylcholine Chloride (MIOCHOL-E) 20 MG intra-ocular injection    dexamethasone (DECADRON) 4 MG/ML injection    tetracaine (PONTOCAINE) 0.5 % ophthalmic solution    ceFAZolin (ANCEF) 1 g injection    bupivacaine (MARCAINE) 0.75 % injection    lidocaine (PF) 1 % injection    balanced salts (BSS) ophthalmic solution         Allergies:   Review of Patient's Allergies indicates:   Pollen extract          Cough    Smoking, Alcohol, Drugs:  Social History    Tobacco Use      Smoking status: Former        Packs/day: 2.00        Years: 25.00        Pack years: 50        Types: Cigarettes        Quit date: 10/27/1977        Years since quitting: 44.4      Smokeless tobacco: Never    Alcohol use: Not Currently      Drug use:   Unknown       PMHx:  Past  Medical History:  09/11/2018: Chronic kidney disease, stage III (moderate) ([place])  08/14/2019: COVID-19      Comment:  Risk category:   high  Date of symptom onset: 08/12/19                 Tested? Positive  Risk factors: CKD, BMI > 30, HTN                 Clinical Course First date of symptoms:  08/12/19                 08/15/19: (DOI 3): Pt states she is feeling well. Slight                ST and sinus congestion. No cough, fever, SOB, DOE or  edema. F/U in 3 days.  08/18/2019: DOI 7- Feeling better                over all, stable vitals, CM f/u in 2 days. 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/2018: Mild episode of recurrent major depressive disorder ([place])  02/20/2021: Osteopenia  11/29/2017: Panic disorder  No date: Wears eyeglasses    Vitals  BP 132/83   Pulse 79   Temp 97.5 F (36.4 C) (Temporal)   Resp 25   Ht 4' 11.5" (1.511 m)   Wt 87.9 kg (193 lb 12.8 oz)   SpO2 98%   BMI 38.49 kg/m     Anesthesia Evaluation    Physical Exam    General     Level of consciousness:  Alert and oriented (time, person, place)   BMI   BMI greater than 30 kg/m2   Airway     Mallampati:  I    TM distance:  >3 FB    Mouth opening:  >3 FB    Neck ROM:  Mildly Limited   Teeth     Heart   - normal exam     Lungs - normal exam     Other findings: 4 lower teeth left.                  Pertinent Labs:   Lab Results   Component Value Date    NA 139 10/22/2021    K 4.0 10/22/2021    CREAT 1.2 10/22/2021    GLUCOSER 103 10/22/2021    WBC 5.5 07/14/2021    HCT 34.5 07/14/2021    PLTA 303 07/14/2021         Anesthesia Plan    ASA Score:     ASA:  3    Airway:      Mallampati:  I    Mouth opening:  >3 FB    Neck ROM:  Mildly Limited    TM distance:  >3 FB     Plan: MAC    Other information:     EKG Reviewed: : Yes      Post-Plan::  PACU    Informed Consent:     Anesthetic plan and risks discussed with:  Patient   Patient Consented

## 2022-03-26 ENCOUNTER — Encounter (HOSPITAL_BASED_OUTPATIENT_CLINIC_OR_DEPARTMENT_OTHER): Payer: Self-pay | Admitting: Student in an Organized Health Care Education/Training Program

## 2022-03-26 ENCOUNTER — Encounter (HOSPITAL_BASED_OUTPATIENT_CLINIC_OR_DEPARTMENT_OTHER): Payer: Self-pay | Admitting: Ophthalmology

## 2022-03-26 ENCOUNTER — Encounter (HOSPITAL_BASED_OUTPATIENT_CLINIC_OR_DEPARTMENT_OTHER): Payer: Self-pay

## 2022-03-26 ENCOUNTER — Ambulatory Visit: Payer: No Typology Code available for payment source | Attending: Ophthalmology | Admitting: Ophthalmology

## 2022-03-26 DIAGNOSIS — R1013 Epigastric pain: Secondary | ICD-10-CM

## 2022-03-26 DIAGNOSIS — I10 Essential (primary) hypertension: Secondary | ICD-10-CM

## 2022-03-26 DIAGNOSIS — K219 Gastro-esophageal reflux disease without esophagitis: Secondary | ICD-10-CM

## 2022-03-26 DIAGNOSIS — F330 Major depressive disorder, recurrent, mild: Secondary | ICD-10-CM

## 2022-03-26 DIAGNOSIS — H25812 Combined forms of age-related cataract, left eye: Secondary | ICD-10-CM | POA: Diagnosis present

## 2022-03-26 DIAGNOSIS — R140 Abdominal distension (gaseous): Secondary | ICD-10-CM

## 2022-03-26 MED ORDER — PREDNISOLONE ACETATE 1 % OP SUSP
1.00 [drp] | Freq: Four times a day (QID) | OPHTHALMIC | 1 refills | Status: AC
Start: 2022-03-26 — End: 2022-04-26

## 2022-03-26 NOTE — Progress Notes (Signed)
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   Slept ok last night.   No pain.   No flashes/floaters.    Ocular meds   Prednisolone 4/day OS LD this am   Vigamox 4/day OS LD this am

## 2022-03-26 NOTE — Progress Notes (Signed)
Sara Snyder was seen day #1 s/p uncomplicated cataract surgery OS. No C.O.    Impression,    1. Pseudophakia Day #1 OS-      She was doing well.    She was told to  use Pred Forte 1% and Vigamox 1 gtt in the post operative eye QID.      She was instructed to cover the operated eye with the metal shield while sleeping for the first week and to return to clinic in  one week for follow up.     She was also instructed to return to clinic immediately if there is decreased vision, ocular pain, or any other problems that develop.      2.  Pseudophakia OD 02/03/2022

## 2022-04-01 ENCOUNTER — Ambulatory Visit: Payer: No Typology Code available for payment source

## 2022-04-01 NOTE — Progress Notes (Signed)
Erroneous encounter - please disregard. Reached pt's daughter who provided pt's cell phone number - unable to reach pt. Left VM.

## 2022-04-03 ENCOUNTER — Ambulatory Visit (HOSPITAL_BASED_OUTPATIENT_CLINIC_OR_DEPARTMENT_OTHER): Payer: No Typology Code available for payment source | Admitting: Ophthalmology

## 2022-04-08 ENCOUNTER — Ambulatory Visit: Payer: No Typology Code available for payment source | Attending: Internal Medicine | Admitting: Internal Medicine

## 2022-04-08 ENCOUNTER — Other Ambulatory Visit: Payer: Self-pay

## 2022-04-08 ENCOUNTER — Encounter (HOSPITAL_BASED_OUTPATIENT_CLINIC_OR_DEPARTMENT_OTHER): Payer: Self-pay | Admitting: Internal Medicine

## 2022-04-08 VITALS — BP 128/69 | HR 75 | Temp 96.3°F | Ht <= 58 in | Wt 196.0 lb

## 2022-04-08 DIAGNOSIS — R0609 Other forms of dyspnea: Secondary | ICD-10-CM

## 2022-04-08 DIAGNOSIS — E669 Obesity, unspecified: Secondary | ICD-10-CM | POA: Diagnosis not present

## 2022-04-08 DIAGNOSIS — R002 Palpitations: Secondary | ICD-10-CM | POA: Diagnosis present

## 2022-04-08 NOTE — Addendum Note (Signed)
Addended by: Paulo Fruit on: 04/08/2022 10:38 AM     Modules accepted: Orders

## 2022-04-08 NOTE — Progress Notes (Signed)
PT safe at home -DJ

## 2022-04-08 NOTE — Progress Notes (Signed)
CARDIOLOGY OFFICE PROGRESS NOTE  Date of visit: 04/08/2022    Sept 2022 televisit:  Call dau Marin Shutter at [phone] # --  Speaking slowly was able to comm well with [name] in [place].    66 y/o [place] mother of 5 and GM of 3, today  acc'd and interpreted in the past by her [place], cell [phone].  But [name] asks for tele terp, even though her English is good.  So went with the tele, even though the interp will not be as good as a dau who knows her.    In the past was acc'd and interpreted by other [place], cell [phone],  Pt resides with her husband and one dau and one grandchild.      Today Oct 2023 acco;d & interpreted  by dau #3 (of 5), Luana.    At conclusion of visit, dau said mo still wanted an interpreter.        Confirm Patient Identity Sara Snyder ; 1955/10/11)  In summary patient is 66 year old female with the following medical problems:  DOE (dyspnea on exertion)  Obesity (BMI 30-39.9)  Palpitations   Diastolic dysfunction on ECHO      She was referred by Dr. Annie Sable  for concerns of SVT and VPC's  on Holter of Sept 2020.  Marland Kitchen PMH includes hypertension, hyperlipidemia.    Holter and echo Sept 2020 revealed grade 2 diastolic dysfunction.  Her PCP Dr. Annie Sable referred for for eval of the Holter findings of 7 short runs of SVT but no VT.    Hx includes palpitation with panic attacks 1-2x/year for many years.  In the past she c/o chest pain near her right breast, but not now.  C/o SOB both at rest and with exertion -- c/o SOB while I'm interviewing her but she is not tachypneic with no evidence of distress.  The dyspnea appears to be supratentorial.    She says her breathing is worse since she had Covid Feb 2021. Walked here from the main entrance of the hospital.sin para. Says trouble with breath with walking x years.    Sept 2022 via dau:  No CP or SOB.    She is limited by bilateral knee pain. (her BMI is 40 at 4'10", 194 lbs.)     Nonexertional. BP  has been labile with SBP from 90  -190.  She has lightheadedness with bending down. No syncope.  She feels extremely fatigue and SOB with minimal housework. No regular exercise during the pandemic.      Current Outpatient Medications:     prednisoLONE acetate (PRED FORTE) 1 % ophthalmic suspension, Place 1 drop into the left eye in the morning and 1 drop at noon and 1 drop in the evening and 1 drop before bedtime., Disp: 10 mL, Rfl: 1    omeprazole (PRILOSEC) 20 MG capsule, Take 1 capsule by mouth in the morning., Disp: 90 capsule, Rfl: 3    venlafaxine (EFFEXOR-XR) 150 MG 24 hr capsule, TAKE 1 CAPSULE BY MOUTH DAILY, Disp: 30 capsule, Rfl: 3    dulaglutide (TRULICITY) 1.60 VP/7.1GG injection, Inject 0.75 mg under the skin once a week, Disp: 2 mL, Rfl: 0    diclofenac (VOLTAREN) 1 % GEL Gel, Apply 2 g topically 4 (four) times daily as needed, Disp: 300 g, Rfl: 3    clonazePAM (KLONOPIN) 1 MG tablet, TAKE 1 TABLET BY MOUTH TWICE DAILY AS NEEDED, Disp: 60 tablet, Rfl: 1  amlodipine-valsartan (EXFORGE) 5-320 MG per tablet, Take 1 tablet by mouth in the morning., Disp: 30 tablet, Rfl: 11    calcium citrate-citamin D (CALCIUM CITRATE +) 315-5 MG-MCG tablet, Take 1 tablet by mouth in the morning and 1 tablet before bedtime., Disp: 180 tablet, Rfl: 3    latanoprost (XALATAN) 0.005 % ophthalmic solution, Place 1 drop into both eyes nightly, Disp: 2.5 mL, Rfl: 12    atenolol (TENORMIN) 100 MG tablet, Take 1 tablet by mouth nightly, Disp: 90 tablet, Rfl: 1    loratadine (CLARITIN) 10 MG tablet, Take 1 tablet by mouth in the morning., Disp: 30 tablet, Rfl: 6    meclizine (ANTIVERT) 25 MG TABS, Take 1 tablet by mouth every 8 (eight) hours as needed, Disp: 90 tablet, Rfl: 2    chlorthalidone (HYGROTEN) 25 MG tablet, Take 1 tablet by mouth in the morning., Disp: 90 tablet, Rfl: 0    Elastic Bandages & Supports (RA WRIST BRACE ADJ RIGHT L/XL) MISC, Use as able for carpal tunnel syndrome, Disp: 1 each, Rfl: 0    cholecalciferol (VITAMIN D3) 2000 UNIT tablet, Take  1 tablet by mouth in the morning and 1 tablet before bedtime., Disp: 180 tablet, Rfl: 3    acetaminophen (TYLENOL) 500 MG tablet, Take 1 tablet by mouth every 4 (four) hours as needed for Pain, Disp: 60 tablet, Rfl: 3    levothyroxine (SYNTHROID) 50 MCG tablet, Take 1 tablet by mouth every morning before breakfast, Disp: 90 tablet, Rfl: 3    Review of Patient's Allergies indicates:   Pollen extract          Cough    SOCIAL & FAMILY HISTORY: No fam hx heart dis. No substances.    Review of systems: All other systems reviewed were pertinently negative apart from the history of present illness.      PHYSICAL EXAM:  BP 128/69 (Site: LA, Position: Sitting, Cuff Size: Lrg)   Pulse 75   Temp 96.3 F (35.7 C) (Temporal)   Ht 4\' 10"  (1.473 m)   Wt 88.9 kg (196 lb)   SpO2 95%   BMI 40.96 kg/m     General:  Alert and comfortable.  There were no vitals filed for this visit.  HEENT: Ocular muscles are intact and the face is symmetric with midline tongue.  NECK:  No palpable cervical nodes or thyroid, with no JVD.  CHEST: Clear to auscultation, no crackles or rhonchi.  HEART: Regular with no  murmurs, clicks, or gallops/  ABDOMEN: No palpable organs, masses, or tenderness.  EXTREMITIES: No rash, clubbing, or cyanosis, with no edema.  NEURO: A&O X 3, no obvious motor or sensory deficits  PSYCHIATRIC: Mood and affect are appropriate.  SKIN: No rash.      PERTINENT INVESTIGATIONS:    1.  EKG  (on my personal review)             May 2021: NSR 61.Very low amp P's -- V1 best seen.NORMAL.                Jan 2022:  NSR 72.  LVH voltage.  Normal ST-T's.               Oct 203:  NSR 71.  NORMAL.  (Borderline voltage for LVH).  NO ST-T's.        2. LABS:   Lab Results   Component Value Date    NA 139 10/22/2021    K 4.0 10/22/2021    CL 99 10/22/2021  CO2 30 10/22/2021    BUN 18 10/22/2021    CREAT 1.2 10/22/2021    GLUCOSER 103 10/22/2021     Lab Results   Component Value Date    LDL 153 07/14/2021    HDL 44 07/14/2021    TG 148  06/13/2020       3. ECHOCARDIOGRAM            September 2020:                    60%. No RWMA's. Gr 2 DD. Asc ao 37 mm.Marland Kitchen               April 2022:  60%.  Gr 2 DD.  Mild AI. Mild asc ao ectasia 40 mm.         4. STRESS TEST:            March 2022:  3 minutes, stopped for SOB & knee pain.  82% HR.  DP 22.                5 METS.  20% decr ex cap.   No CP or dangerous arrhythmia.  But 1 mm flat ST in inflat leads, 1st min, subside 11 min recovery.            Sept 2022:  Lexi-nuke.  1 mm ST depr with Lexiscan but no sxs.                  Nuclear imaging:  NORMAL PERFUSION =  no ischemia or infarction.                          5. HOLTER          Sept 2020:       NSR 70, 52 - 100.  600 APC = 0.6T, 15 AC, 7 short      slow SVT.600 VPC - 0.6%, 2 VC, no VT.  No sxs.           April 2022:  73, 54 - 121.  142 APC's:  4 AC, one 5-beat run @ 130 BPM.              500 VPC's -- no VC or VT.  No sx.         ASSESSMENT/ PLAN:    (R00.2) Palpitations  (primary encounter diagnosis)  Comment:  Holter above Sept 2020 with 600 APC and 600 VPC with 7 short runs SVT, but no VT.      Was already on atenolol.  No further treatment or work-up is indicated given the small burden of VPCs in a patient with no wall motion abnormality on echo and chronic mild symptoms.    Reports palps with anxiety.  In the past she became tearful, dau trying to help her answer questions.  Palps -- feels like heart is beating faster -- lies down until it subsides over an hour.  Occ takes Ativan for it.  No falling.  + vertigo hx. -- "labyrinthitis". - could last a week at a time.  "Head feels bad", can last hours, all day long.  Does not sound like cardiac palps.  No further assessment unless spontaneous hemodynamic c/o's.  She tends to respond affirmatively to virtually all questions re sxs, so better to await spontaneous c/o's    Oct 2023:  not an issue.     April 2022:      CHEST PRESSURE:  unchanged Oct 2023.  May 2021 reported precordial pressure -- "anguish  or pressure -- tightness".  Goes off on tangent about meds, depression, bones, etc etc.  Cautioned to answer questions explicitly.  Dau assists -- twice per day, lasting hours.  No radiation.  Can be severe.  Loquacious -- very poor historian -- doesn't address questions, rambles.  With and without exertion,  Esp when sad -- can't say if assoc'd  with panic attacks.  No assoc'd diaphoresis, + nausea, some.  I advised her that it doesn't sound cardiac.    Negligible exercise.  Most vigorous activity is cooking.  No stairs.  Pillows x 2.  Nocturia x 2.  Exam in the past negative except for morbid obesity.  Palps primarily assoc'd with anxiety --> unlikely to be pathologic.     (I49.3) PVC (premature ventricular contraction)  Comment: Same as above.    (I10) Essential hypertension  Comment: Labile blood pressure.  Oct 2023 128/69.  No BP diary again.  She is on chlorthalidone, atenolol and valsartan.  Asked patient to record blood pressure twice a day and reevaluate with in person visit in a month.  17 May:  She didn't do it -- brought the wrist monitor but no written numbers -- instructed to prepare 60 BP's with left column of AM's and right column of PM's.  Jan 2022 did not bring diary.  Seems that it isn't so important to her.    CV Rx Oct 2023:   Norvasc 5 combo w valsartan 320. Atenolol 100.        Chlorthalidone 25.      (E78.5) Hyperlipidemia  Comment: 10 year ASCVD risk 12.1%. Will qualify asprin and statin treatment for primary ASCVD prevention, but she wanted to defer Rx.  Asx Oct 2023 -- no need to push for it.  LDL May 2023 153.  Advised to reduce body weight to reduce the chol.      My impression is that of resolved palpitations and non-cardiac SOB and chest pressure -- hours, non-exertional, + with emotions.  She has negligible palps - had no sxs during the Holter.    BP here Oct 2023 excellent at 'll leave BP control to her PCP -- can't do much with her declining to bring BP diary.  April 2022 usually  110/80.     Echo was essentially nl except for grade 2 DD and mild asc ao ectasia of 40 mm.     Recheck in 2-3 years.    Holter with benign ectopy.    Nuclear stress Sept 2022 NEGATIVE:  No infarct or ischemia.  Reassured.    Interpreter at the end of the visit didn't add much.  She asks if her BS is better since she stopped using sugar -- was 103 in May 2023.  Not bad, but the obesity is the issue. She only 4'10", 196 lbs, BMI 41.  Should be circa 125 lbs.      She rambles on and on with the interpreter.  Advised that cataracts low risk for heart. Directed to achieve body dimensions of her daughter Federico Flake.      See me in person one year.

## 2022-04-09 ENCOUNTER — Ambulatory Visit: Payer: No Typology Code available for payment source | Attending: Ophthalmology | Admitting: Optometrist

## 2022-04-09 ENCOUNTER — Encounter (HOSPITAL_BASED_OUTPATIENT_CLINIC_OR_DEPARTMENT_OTHER): Payer: Self-pay | Admitting: Optometrist

## 2022-04-09 DIAGNOSIS — Z961 Presence of intraocular lens: Secondary | ICD-10-CM | POA: Diagnosis not present

## 2022-04-09 DIAGNOSIS — Z9841 Cataract extraction status, right eye: Secondary | ICD-10-CM | POA: Insufficient documentation

## 2022-04-09 DIAGNOSIS — Z9842 Cataract extraction status, left eye: Secondary | ICD-10-CM | POA: Insufficient documentation

## 2022-04-09 NOTE — Progress Notes (Signed)
66 years old female,    Here for 1 week post op eye exam s/p Phaco with PC IOL OS (03/25/2022). S/P cataract surgery OD (02/03/2022).    C/O Blurry vision OS. No pain. No flashes or floaters.    Taking Pred Forte qid OS.

## 2022-04-09 NOTE — Progress Notes (Signed)
Sara Snyder is doing well two weeks s/p phacoemulsification with Posterior Chamber Intraocular lens implant of the left eye.  she is instructed to discontinue the Vigamox / Moxifloxacin / Ciloxan eye drops one week after surgery.  she will use Pred Forte 1% (or Econopred, , prednisolone) eye drops one drop, four times a day for one month totally in the  left eye.   The patient will call us immediately for any eye pain, increased redness, or for decreased vision.  Otherwise she will f/u in three-four weeks for refraction and dilated examination.

## 2022-04-12 LAB — EKG

## 2022-04-16 ENCOUNTER — Ambulatory Visit (HOSPITAL_BASED_OUTPATIENT_CLINIC_OR_DEPARTMENT_OTHER): Payer: No Typology Code available for payment source | Admitting: Optometrist

## 2022-04-30 ENCOUNTER — Other Ambulatory Visit: Payer: Self-pay

## 2022-04-30 ENCOUNTER — Encounter (HOSPITAL_BASED_OUTPATIENT_CLINIC_OR_DEPARTMENT_OTHER): Payer: Self-pay

## 2022-04-30 ENCOUNTER — Ambulatory Visit: Payer: No Typology Code available for payment source | Attending: Ophthalmology | Admitting: Optometrist

## 2022-04-30 DIAGNOSIS — H524 Presbyopia: Secondary | ICD-10-CM | POA: Insufficient documentation

## 2022-04-30 DIAGNOSIS — H52203 Unspecified astigmatism, bilateral: Secondary | ICD-10-CM | POA: Diagnosis not present

## 2022-04-30 DIAGNOSIS — Z961 Presence of intraocular lens: Secondary | ICD-10-CM | POA: Diagnosis present

## 2022-04-30 DIAGNOSIS — H5213 Myopia, bilateral: Secondary | ICD-10-CM | POA: Diagnosis not present

## 2022-04-30 NOTE — Progress Notes (Signed)
I had a pleasure of seeing Sara Snyder at the [facility] on 04/30/22. The summary of findings of the post op examination is as follows:     Assessment:     1. Pseudophakia OU  - s/p phacoemulsification with PCIOL OD on 02/03/2022 and OS on 03/25/2022 with Dr. SMP.     2. 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 Patient and daughter edu on findings and advised on RD/endophthalmitis precautions. Patient should call immediately for any eye pain, increased redness or decreased vision. Dispensed glasses Rx and discussed options for correction.     2. Patient due for updated RNFL OCT and HVF after cataract surgery. Monitor with Dr. SMP    3. Monitor with Dr. Ellwood Sayers    RTC about 2 months for f/u with SMP

## 2022-04-30 NOTE — Patient Instructions (Signed)
Sheryle Hail imediatamente [phone]) se sentir qualquer dor nos olhos, Production manager vermelhido ou reduo da viso.     Retorne  clnica daqui a aproximadamente 2 mses      Para ajudar a ler de perto as letras midas, h muitas opes para correo visual. Essas opes incluem:  - Alternar entre um par de culos com prescrio s para longe e um par de culos com prescrio s para perto, colocando-os e tirando-os na distncia apropriada.   - Bifocais com a linha de diviso, que teriam a prescrio para longe na parte superior da lente (acima da linha) e a prescrio para perto ou para leitura abaixo da linha. No h uma faixa para se trabalhar com o computador nas lentes bifocais com linha.   - Lentes progressivas, que so um tipo de bifocais sem a linha. O efeito das lentes progressivas comea com a prescrio para longe na parte superior da lente fazendo uma transio progressiva para baixo da lente, aumentando a capacidade de Doctor, general practice. No meio da lente existe uma faixa perfeita para se trabalhar com o computador.     **Sua receita  escrita para que voc possa fazer qualquer [employer].    **Para fazer seus culos, voc pode levar sua receita para a tica da [employer] (informaes na parte inferior da receita)     Seno, voc pode levar a receita para qualquer outra tica juntamente com o seu carto de seguro. Certifique-se de que a tica aceita seu seguro se voc tiver cobertura para culos.   Outros pontos de venda de produtos ticos: Therapist, music, [employer], [employer], e algumas unidades do [employer]/[facility]/[employer].     For glasses you can bring your prescription to the [facility] in [place] (information on the bottom of the prescription)      Otherwise, you can bring the paper to any other optical shop along with your insurance card. Do make sure the optical shop accepts your insurance if you have coverage for glasses.     [facility]   [address]  [place], [place]  [address]  (Ph) [phone]    [facility]   [address]  [place], [place] [address]  (Ph) [phone]    [facility]  [address], [place] [address]  (Ph) [phone]    [facility]  [address] [place], [place] [address]  (Ph) [phone]    [facility]   [address] [place], [place] [address]  (Ph) [phone]    [facility]  [address] [place], [place]   (Ph) [phone]    [facility]   [address] [place]  [address]  (Ph) [phone]     [facility]   [address] [place] [address]  (Ph) [phone]     [facility]   [address]  [place], [place] [address]  (Ph) [phone]

## 2022-05-01 ENCOUNTER — Encounter (HOSPITAL_BASED_OUTPATIENT_CLINIC_OR_DEPARTMENT_OTHER): Payer: Self-pay

## 2022-05-01 ENCOUNTER — Other Ambulatory Visit: Payer: Self-pay

## 2022-05-01 ENCOUNTER — Ambulatory Visit: Payer: No Typology Code available for payment source | Attending: Internal Medicine

## 2022-05-01 VITALS — Temp 97.2°F | Resp 22 | Ht 58.27 in | Wt 196.0 lb

## 2022-05-01 DIAGNOSIS — R42 Dizziness and giddiness: Secondary | ICD-10-CM | POA: Insufficient documentation

## 2022-05-01 DIAGNOSIS — K219 Gastro-esophageal reflux disease without esophagitis: Secondary | ICD-10-CM | POA: Insufficient documentation

## 2022-05-01 DIAGNOSIS — R140 Abdominal distension (gaseous): Secondary | ICD-10-CM

## 2022-05-01 DIAGNOSIS — R1013 Epigastric pain: Secondary | ICD-10-CM

## 2022-05-01 LAB — BASIC METABOLIC PANEL
ANION GAP: 9 mmol/L — ABNORMAL LOW (ref 10–22)
BUN (UREA NITROGEN): 21 mg/dL — ABNORMAL HIGH (ref 7–18)
CALCIUM: 9.1 mg/dL (ref 8.5–10.5)
CARBON DIOXIDE: 30 mmol/L (ref 21–32)
CHLORIDE: 101 mmol/L (ref 98–107)
CREATININE: 1.2 mg/dL (ref 0.4–1.2)
ESTIMATED GLOMERULAR FILT RATE: 49 mL/min — ABNORMAL LOW (ref >60)
Glucose Random: 144 mg/dL (ref 74–160)
POTASSIUM: 3.9 mmol/L (ref 3.5–5.1)
SODIUM: 140 mmol/L (ref 136–145)

## 2022-05-01 LAB — CBC WITH PLATELET
ABSOLUTE NRBC COUNT: 0 10*3/uL (ref 0.0–0.0)
HEMATOCRIT: 35.5 % (ref 34.1–44.9)
HEMOGLOBIN: 11.5 g/dL (ref 11.2–15.7)
MEAN CORP HGB CONC: 32.4 g/dL (ref 31.0–37.0)
MEAN CORPUSCULAR HGB: 29.2 pg (ref 26.0–34.0)
MEAN CORPUSCULAR VOL: 90.1 fl (ref 80.0–100.0)
MEAN PLATELET VOLUME: 10.8 fL (ref 8.7–12.5)
NRBC %: 0 % (ref 0.0–0.0)
PLATELET COUNT: 313 10*3/uL (ref 150–400)
RBC DISTRIBUTION WIDTH STD DEV: 43.8 fL (ref 35.1–46.3)
RED BLOOD CELL COUNT: 3.94 M/uL (ref 3.90–5.20)
WHITE BLOOD CELL COUNT: 7.1 10*3/uL (ref 4.0–11.0)

## 2022-05-01 LAB — THYROID SCREEN TSH REFLEX FT4: THYROID SCREEN TSH REFLEX FT4: 3.32 u[IU]/mL (ref 0.270–4.200)

## 2022-05-01 MED ORDER — OMEPRAZOLE 20 MG PO CPDR
20.0000 mg | DELAYED_RELEASE_CAPSULE | Freq: Every day | ORAL | 0 refills | Status: DC
Start: 2022-05-01 — End: 2022-05-01

## 2022-05-01 MED ORDER — OMEPRAZOLE 20 MG PO CPDR
20.0000 mg | DELAYED_RELEASE_CAPSULE | Freq: Every day | ORAL | 3 refills | Status: DC
Start: 2022-05-01 — End: 2022-05-01

## 2022-05-01 MED ORDER — METOCLOPRAMIDE HCL 5 MG PO TABS
ORAL_TABLET | ORAL | 0 refills | Status: DC
Start: 2022-05-01 — End: 2022-05-01

## 2022-05-01 MED ORDER — METOCLOPRAMIDE HCL 5 MG PO TABS
ORAL_TABLET | ORAL | 0 refills | Status: AC
Start: 2022-05-01 — End: 2022-05-08

## 2022-05-01 MED ORDER — OMEPRAZOLE 20 MG PO CPDR
20.0000 mg | DELAYED_RELEASE_CAPSULE | Freq: Every day | ORAL | 0 refills | Status: DC
Start: 2022-05-01 — End: 2022-05-11

## 2022-05-01 NOTE — Progress Notes (Signed)
Interpreter: A [place] telephone interpreter and a face to face interpreter was used today.     SUBJECTIVE  Chief Complaint: dizziness    History of Present Illness: Patient Sara Snyder is a 66 year old female with PMHx of below who presents with her daughter for:    # dizziness   - when still is not dizzy, but if looks at floor or moves head gets very dizzy  - has had dizziness for years, chronic intermittent vertigo, takes meclizine, but not having an effect this week - takes meclizine q 8 hours every day, sometimes only once a day, but when has a cold takes it every 8 hours   - Friday felt sick w/ chills, cough, mild 3/10 frontal headaches - that all got better   - all better except still more dizzy   - this dizziness is the same as in the past   - no falls   - covid neg  - no fever, rigors, vomiting  - no cp or sob, or palps   - no med changes  - no visual changes     HEMOGLOBIN A1C (%)   Date Value   02/24/2022 6.4 (H)   07/14/2021 6.3 (H)   06/13/2020 6.2 (H)     No results found for: POCA1C    Review of Systems: Pertinent positives and negatives per HPI.    PMH:  Patient Active Problem List:     Essential hypertension     Labyrinthitis     Panic disorder     Hyperlipidemia     Chronic cough     Mild episode of recurrent major depressive disorder (HCC)     Allergic rhinitis     Bilateral arm pain     Stage 3a chronic kidney disease     Anxiety state     Palpitations     Psychophysiological insomnia     Secondary hyperparathyroidism (HCC)     Osteopenia     Carpal tunnel syndrome of right wrist     Polyarthralgia     Hair loss     Vitamin D deficiency     Obesity (BMI 30-39.9)     Bilateral renal artery stenosis (HCC)     DOE (dyspnea on exertion)     Recurrent epistaxis     Gastroesophageal reflux disease without esophagitis     Other specified hypothyroidism     Combined forms of age-related cataract of right eye     Preoperative examination     Cervical cancer screening     Prediabetes     Healthcare  maintenance     Schistosomiasis     Intraoperative floppy iris syndrome (IFIS)     Combined forms of age-related cataract of left eye      Past Surgical History:  No date: CATARACT REMOVAL INSERTION OF LENS; Right  10/2016: HEMORRHOID SURGERY  No date: OB ANTEPARTUM CARE CESAREAN DLVR & POSTPARTUM    Social History    Tobacco Use      Smoking status: Former        Packs/day: 2.00        Years: 25.00        Pack years: 50        Types: Cigarettes        Quit date: 10/27/1977        Years since quitting: 44.5      Smokeless tobacco: Never    Alcohol use: Not Currently  No family history on file.        Current Outpatient Medications:     omeprazole (PRILOSEC) 20 MG capsule, Take 1 capsule by mouth in the morning., Disp: 90 capsule, Rfl: 3    venlafaxine (EFFEXOR-XR) 150 MG 24 hr capsule, TAKE 1 CAPSULE BY MOUTH DAILY, Disp: 30 capsule, Rfl: 3    dulaglutide (TRULICITY) 0.75 MG/0.5ML injection, Inject 0.75 mg under the skin once a week, Disp: 2 mL, Rfl: 0    diclofenac (VOLTAREN) 1 % GEL Gel, Apply 2 g topically 4 (four) times daily as needed, Disp: 300 g, Rfl: 3    clonazePAM (KLONOPIN) 1 MG tablet, TAKE 1 TABLET BY MOUTH TWICE DAILY AS NEEDED, Disp: 60 tablet, Rfl: 1    amlodipine-valsartan (EXFORGE) 5-320 MG per tablet, Take 1 tablet by mouth in the morning., Disp: 30 tablet, Rfl: 11    calcium citrate-citamin D (CALCIUM CITRATE +) 315-5 MG-MCG tablet, Take 1 tablet by mouth in the morning and 1 tablet before bedtime., Disp: 180 tablet, Rfl: 3    latanoprost (XALATAN) 0.005 % ophthalmic solution, Place 1 drop into both eyes nightly, Disp: 2.5 mL, Rfl: 12    atenolol (TENORMIN) 100 MG tablet, Take 1 tablet by mouth nightly, Disp: 90 tablet, Rfl: 1    loratadine (CLARITIN) 10 MG tablet, Take 1 tablet by mouth in the morning., Disp: 30 tablet, Rfl: 6    meclizine (ANTIVERT) 25 MG TABS, Take 1 tablet by mouth every 8 (eight) hours as needed, Disp: 90 tablet, Rfl: 2    chlorthalidone (HYGROTEN) 25 MG tablet, Take 1  tablet by mouth in the morning., Disp: 90 tablet, Rfl: 0    Elastic Bandages & Supports (RA WRIST BRACE ADJ RIGHT L/XL) MISC, Use as able for carpal tunnel syndrome, Disp: 1 each, Rfl: 0    cholecalciferol (VITAMIN D3) 2000 UNIT tablet, Take 1 tablet by mouth in the morning and 1 tablet before bedtime., Disp: 180 tablet, Rfl: 3    acetaminophen (TYLENOL) 500 MG tablet, Take 1 tablet by mouth every 4 (four) hours as needed for Pain, Disp: 60 tablet, Rfl: 3    levothyroxine (SYNTHROID) 50 MCG tablet, Take 1 tablet by mouth every morning before breakfast, Disp: 90 tablet, Rfl: 3    Review of Patient's Allergies indicates:   Pollen extract          Cough    OBJECTIVE  Temp 97.2 F (36.2 C) (Temporal)   Resp 22   Ht 4' 10.27" (1.48 m)   SpO2 97%   BMI 40.59 kg/m     Orthostatic Vitals  BP Pulse Position Site Cuff Size Time Date   125/72 77 Sitting Left Arm Large  2:55 PM 05/01/2022   130/66 80 Supine Left Arm Large  2:57 PM 05/01/2022   147/90 79 Standing Left Arm Large  2:58 PM 05/01/2022   Peak Flow Information  Peak Flow Resp Time Date   --- 22  2:55 PM 05/01/2022   No repeat blood pressure data filed.  No pain information filed.    Physical Exam:  Gen: Well-appearing, in no acute distress.    Skin: Warm, dry, no evident rashes or lesions.  HEENT: NCAT. EOMI, PERRL, conjunctiva clear. Auditory canal normal, TMs grey with normal light reflex, hearing intact to voice. Nasal septum midline, turbinates normal. Moist mucous membranes, oropharynx clear. No tonsillar edema, erythema, or purulence.   Neck: Full ROM of neck. No thyromegaly. No anterior cervical, posterior cervical, submandibular, auricular, occipital, or supraclavicular  LAD.   Pulm: CTA bilaterally, no wheezes, no rales, no rhonchi. Not using accessory muscles of respiration. Normal JVP.   CV: RRR, normal S1 and S2, no M/R/G.  Abd: Normoactive BS, soft, non-tender, non-distended. No rebound tenderness, no guarding. No Murphy's sign or McBurney point  tenderness. No organomegaly. No masses.  MSK: Moving all 4 extremities appropriately.   Neuro: Awake, AAOx3 to person, place, time. CN II-XII grossly intact. Sensation to light touch intact in all extremities. Normal muscle tone and strength 5/5 in all extremities. Dizziness not elicited on exam. Gait and balance normal.   Psych: Euthymic.     ASSESSMENT/PLAN    1. Dizziness  Acute on chronic dizziness in setting of recent viral URI, brought on by position change such as looking down, and turning head side to side. No red flags. No head trauma. No cp, sob, palps. VSS, euvolemic on exam. Neuro exam wnl. Not c/w cardiac etiology or orthostatism. Low c/f dangerous intracranial etiology given nonfocal neuro exam. C/w mild worsening of her chronic vertiginous dizziness, likely 2/2 viral uri which has otherwise resolved. Rec attn hydration, rest, r/o other common etiologies as below. Trial Reglan in place of meclizine. F/u if not improving 1 week, sooner prn new/worsening symptoms. Strict  ED/911 precautions for severe symptoms.   - BASIC METABOLIC PANEL  - CBC WITH PLATELET  - THYROID SCREEN TSH REFLEX FT4  - metoclopramide (REGLAN) 5 MG tablet; Take 5 mg to 10 mg every 6 hours as needed.  Dispense: 28 tablet; Refill: 0    2. Chronic GERD  Pt requesting refill resent to pharmacy. Not discussed in detail. F/u w/ PCP prn.   - omeprazole (PRILOSEC) 20 MG capsule; Take 1 capsule by mouth in the morning.  Dispense: 30 capsule; Refill: 0    Follow-Up: 1 week if not improving     Precautions given, all questions answered.   Discussed risks/benefits, SEs of medications.     CC Chart: PCP, Janna Arch, MD    I spent a total of 35 minutes on this visit on the date of service (total time includes all activities performed on the date of service)    Lindalou Hose, PA-C

## 2022-05-11 ENCOUNTER — Encounter (HOSPITAL_BASED_OUTPATIENT_CLINIC_OR_DEPARTMENT_OTHER): Payer: Self-pay

## 2022-05-11 ENCOUNTER — Encounter (HOSPITAL_BASED_OUTPATIENT_CLINIC_OR_DEPARTMENT_OTHER): Payer: Self-pay | Admitting: Student in an Organized Health Care Education/Training Program

## 2022-05-11 ENCOUNTER — Encounter (HOSPITAL_BASED_OUTPATIENT_CLINIC_OR_DEPARTMENT_OTHER): Payer: Self-pay | Admitting: "Psychiatric/Mental Health

## 2022-05-11 DIAGNOSIS — H8309 Labyrinthitis, unspecified ear: Secondary | ICD-10-CM

## 2022-05-11 DIAGNOSIS — F330 Major depressive disorder, recurrent, mild: Secondary | ICD-10-CM

## 2022-05-11 DIAGNOSIS — M545 Low back pain, unspecified: Secondary | ICD-10-CM

## 2022-05-11 DIAGNOSIS — I10 Essential (primary) hypertension: Secondary | ICD-10-CM

## 2022-05-11 DIAGNOSIS — F411 Generalized anxiety disorder: Secondary | ICD-10-CM

## 2022-05-11 DIAGNOSIS — K219 Gastro-esophageal reflux disease without esophagitis: Secondary | ICD-10-CM

## 2022-05-11 MED ORDER — OMEPRAZOLE 20 MG PO CPDR
20.0000 mg | DELAYED_RELEASE_CAPSULE | Freq: Every day | ORAL | 0 refills | Status: DC
Start: 2022-05-11 — End: 2022-07-13

## 2022-05-11 MED ORDER — AMLODIPINE BESYLATE-VALSARTAN 5-320 MG PO TABS
1.0000 | ORAL_TABLET | Freq: Every day | ORAL | 11 refills | Status: DC
Start: 2022-05-11 — End: 2023-06-15

## 2022-05-11 MED ORDER — MECLIZINE HCL 25 MG PO TABS
25.0000 mg | ORAL_TABLET | Freq: Three times a day (TID) | ORAL | 0 refills | Status: DC | PRN
Start: 2022-05-11 — End: 2022-12-03

## 2022-05-11 MED ORDER — CHLORTHALIDONE 25 MG PO TABS
25.0000 mg | ORAL_TABLET | Freq: Every day | ORAL | 0 refills | Status: DC
Start: 2022-05-11 — End: 2022-08-12

## 2022-05-11 MED ORDER — ATENOLOL 100 MG PO TABS
100.0000 mg | ORAL_TABLET | Freq: Every evening | ORAL | 1 refills | Status: DC
Start: 2022-05-11 — End: 2022-12-04

## 2022-05-11 MED ORDER — LEVOTHYROXINE SODIUM 50 MCG PO TABS
50.0000 ug | ORAL_TABLET | Freq: Every morning | ORAL | 3 refills | Status: DC
Start: 2022-05-11 — End: 2022-07-27

## 2022-05-12 MED ORDER — CYCLOBENZAPRINE HCL 5 MG PO TABS
5.00 mg | ORAL_TABLET | Freq: Every evening | ORAL | 0 refills | Status: AC | PRN
Start: 2022-05-12 — End: 2022-05-26

## 2022-05-12 MED ORDER — CLONAZEPAM 1 MG PO TABS
ORAL_TABLET | ORAL | 1 refills | Status: DC
Start: 2022-05-12 — End: 2022-06-30

## 2022-05-19 ENCOUNTER — Encounter (HOSPITAL_BASED_OUTPATIENT_CLINIC_OR_DEPARTMENT_OTHER): Payer: Self-pay | Admitting: "Psychiatric/Mental Health

## 2022-05-19 ENCOUNTER — Ambulatory Visit: Payer: No Typology Code available for payment source | Admitting: "Psychiatric/Mental Health

## 2022-05-19 DIAGNOSIS — F410 Panic disorder [episodic paroxysmal anxiety] without agoraphobia: Secondary | ICD-10-CM | POA: Insufficient documentation

## 2022-05-19 DIAGNOSIS — F3341 Major depressive disorder, recurrent, in partial remission: Secondary | ICD-10-CM | POA: Diagnosis not present

## 2022-05-19 DIAGNOSIS — F411 Generalized anxiety disorder: Secondary | ICD-10-CM | POA: Diagnosis not present

## 2022-05-19 MED ORDER — VENLAFAXINE HCL ER 150 MG PO CP24
ORAL_CAPSULE | ORAL | 3 refills | Status: DC
Start: 2022-05-19 — End: 2022-05-25

## 2022-05-19 NOTE — Progress Notes (Signed)
She stated there was nothing she could do about the volume.ADULT OUTPATIENT PSYCHIATRY (OPD)  PSYCHOPHARM FOLLOW UP    BH TEAM- Geriatrics    LANGUAGE OF CARE:    [place]      LANGUAGE NEEDS MET:    Telephone Interpreter    CHIEF COMPLAINT:     SUBJECTIVE DATA/INTERVAL HISTORY:   Sara Snyder is assessed in a psychopharm f/u televisit with a [place] interpreter.  Much difficulty w/ 1st interpreter. Requested she speak louder or turn up volume on her phone. She said there was nothing she could do about the volume. Patient's voice was louder than interpreter's voice. Then the interpreter had to be told to repeat what patient's response was to writer's question, and not to ask the patient a question based on the patient's response to writer's question. This happened a few times, and then the interpreter abruptly ended the call. Called Interpreter service to report the incident. Then called patient w/ a new interpreter.    "I"m well, thanks be to god."  "I'm still watching my grandchildren after school while their parents work."   "My bones are still hurting. I have pain all over my body and that's what's bothering me. My hips hurt. I can't walk much or do much."   "I had surgery on my eyes. I'm supposed to be wearing glasses but unfortunately cannot afford them. I had cataract surgery." She feels very stressed b/c of her vision problems stating she "can barely see." She wants to know where she can get "glasses for free." Recommend she call her opthalmologist to discuss the issue. She then she said she doesn't speak [place]. Recommend she have her daughter call the ophthalmologist, and she replies thatr she doesn't want to bother her daughter b/c "she is so busy." Again, recommend she ask her daughter to call the ophthalmologist  as her vision is a high priority and has her quite stressed..   She reports she cooks, washes the dishes, does chores, cares for grandchildren-ages 48 and 68 y/o, and watches  TV.   Effexor 150 mg AM; Klonopin 1 mg BID, PRN(every morning and night).  She is tangential and needs refocus; reports mood is "a little bit down about my bone pain"; "my bone pain"-anxiety; no-depression, sadness, irritability; worry, anhedonia, hopelessness, anger, guilt, racing thoughts, rumination, or SI. ; Energy, motivation, appetite  are normal. Sleep-disturbed b/c she plays games on computer once in bed so falls asleep around 1-2 AM. (Recommend no electronic devices for 1/2-1 hour prior to bedtime). She is not hypomanic or overtly psychotic..   Med compliant.        OBJECTIVE DATA:  CURRENT MEDICATIONS:    Current Outpatient Medications   Medication Sig    traZODone (DESYREL) 50 MG tablet 1/2 - 1 tablet HS, PRN    fluticasone (FLONASE) 50 MCG/ACT nasal spray 1 spray by Each Nostril route daily    benzonatate (TESSALON) 100 MG capsule Take 1 capsule by mouth 3 (three) times daily as needed for Cough  for up to 20 days    venlafaxine (EFFEXOR-XR) 37.5 MG 24 hr capsule 1 tablet AM along with 75 mg-total = 112.5 mg    venlafaxine (EFFEXOR-XR) 75 MG 24 hr capsule Take 1 capsule by mouth daily Take along with 37.5 mg. TDD=112.5 mg.    valsartan (DIOVAN) 320 MG tablet Take 1 tablet by mouth daily    clonazePAM (KLONOPIN) 1 MG tablet 1 tablet twice daily as needed    chlorthalidone (HYGROTEN)  50 MG tablet Take 1 tablet by mouth daily    atenolol (TENORMIN) 50 MG tablet One tab in am and one tab in pm    omeprazole (PRILOSEC) 20 MG capsule Take 1 capsule by mouth daily    meclizine (ANTIVERT) 25 MG TABS Take 1 tablet by mouth every 8 (eight) hours as needed    Multiple Vitamins-Minerals (HAIR SKIN AND NAILS FORMULA) TABS Take 1 capsule by mouth daily    loratadine (CLARITIN) 10 MG tablet Take 1 tablet by mouth daily    calcium-vitamin D (OSCAL-500) 500-400 MG-UNIT per tablet Take 1 tablet by mouth 2 (two) times daily    azelastine (ASTELIN) 0.1 % nasal spray 1 spray by Each Nostril route 2 (two) times daily Use in  each nostril as directed    acetaminophen (TYLENOL) 500 MG tablet Take 1 tablet by mouth every 6 (six) hours as needed for Pain     No current facility-administered medications for this visit.          VITAL SIGNS:   There were no vitals filed for this visit.    LABS:    WHITE BLOOD CELL COUNT (TH/uL)   Date Value   04/10/2019 5.8     RED BLOOD CELL COUNT (M/uL)   Date Value   04/10/2019 4.14     HEMOGLOBIN (g/dL)   Date Value   04/10/2019 12.2     No results found for: IDEALHCT  HEMATOCRIT (%)   Date Value   04/10/2019 37.5     No results found for: HCTDIFF  MEAN CORPUSCULAR VOL (fl)   Date Value   04/10/2019 90.6     MEAN CORPUSCULAR HGB (pg)   Date Value   04/10/2019 29.5     MEAN CORP HGB CONC (g/dL)   Date Value   04/10/2019 32.5     No results found for: RDW  PLATELET COUNT (TH/uL)   Date Value   04/10/2019 259     MEAN PLATELET VOLUME (fL)   Date Value   04/10/2019 10.7     NEUTROPHIL % (%)   Date Value   04/10/2019 56.9     LYMPHOCYTE % (%)   Date Value   04/10/2019 34.9     MONOCYTE % (%)   Date Value   04/10/2019 5.3     EOSINOPHIL % (%)   Date Value   04/10/2019 2.1     BASOPHIL % (%)   Date Value   04/10/2019 0.5           ALBUMIN (g/dL)   Date Value   04/10/2019 3.8     ALKALINE PHOSPHATASE (U/L)   Date Value   04/10/2019 117     ALANINE AMINOTRANSFERASE (U/L)   Date Value   04/10/2019 30     ASPARTATE AMINOTRANSFERASE (U/L)   Date Value   04/10/2019 21     Glucose Random (mg/dL)   Date Value   07/20/2019 99     BUN (UREA NITROGEN) (mg/dL)   Date Value   07/20/2019 17     CALCIUM (mg/dL)   Date Value   07/20/2019 9.0     CHLORIDE (mmol/L)   Date Value   07/20/2019 104     CARBON DIOXIDE (mmol/L)   Date Value   07/20/2019 30     CREATININE (mg/dL)   Date Value   07/20/2019 1.3 (H)     POTASSIUM (mmol/L)   Date Value   07/20/2019 3.9     SODIUM (mmol/L)  Date Value   07/20/2019 140     BILIRUBIN TOTAL (mg/dL)   Date Value   04/10/2019 0.3     TOTAL PROTEIN (g/dL)   Date Value   04/10/2019 7.4          THYROID SCREEN TSH REFLEX FT4   Date Value Ref Range Status   07/20/2019 4.710 (H) 0.358 - 3.740 uIU/mL Final     Comment:     **As per the Thyroid Screen protocol, an FT4 test is  automatically ordered for all patients with abnormal TSH  results unless the FT4 has already been performed on this  patient within the last 168 hours.        Cholesterol (mg/dL)   Date Value   04/10/2019 223     LOW DENSITY LIPOPROTEIN DIRECT (mg/dL)   Date Value   04/10/2019 159     HIGH DENSITY LIPOPROTEIN (mg/dL)   Date Value   04/10/2019 43     TRIGLYCERIDES (mg/dL)   Date Value   04/10/2019 169 (H)       HEMOGLOBIN A1C (%)   Date Value   07/20/2019 5.9 (H)     No results found for: POCA1C    RPR QUAL   Date Value Ref Range Status   07/20/2019 Non Reactive Non Reactive Final     Comment:     Performed at:  [facility]  [address], [place], [place]  [id number]  Lab Director: Allyn Kenner MD, Phone:  [phone]       No results found for: HIVAB  No results found for: VAL  No results found for: CLOZ    Pregnancy/Birth Control (for female patients): N/A    CURRENT TREATMENT/CONTACT INFO FOR OTHER AGENCIES AND [place] (if applicable):    Contact/Community Support    No documentation.         COLUMBIARISK ASSESSMENTS:     Suicide:  C-SSRS OP Last Contact Screener - 08/16/19 1542          C-SSRS OP Last Contact Screener    Have you wished you were dead or wished you could go to sleep and not wake up?  No     Have you actually had any thoughts of killing yourself?   No     Have you done anything, started to do anything, or prepared to do anything to end your life?  No     C-SSRS OP Last Contact Screener Risk Score  No Risk           C-SSRS Risk Assessment    No documentation.         VIOLENCE:   Violence/Abuse Risk    No documentation.          MENTAL STATUS EXAMINATION:  Pleasant; speech-nl rate/tone; attent/conc-nl; mood-"a little bit down about my bone pain and my eyes"; depression-no; anxiety-"my bone  pain and my eyes"; panic-no; irritability/impatience-"a little but not for long"; sadness-"a little"; anger-no; anhedonia-no; sleep-to bed at 38M-1AM-initial insomnia-yes b/c she plays games on the computer, wakings-for BR, EMA-no, difficulty w/ OOB and facing the day-up at 9-10 AM; appetite-nl; energy-nl; motivation-nl; isol/withdr-no; fatigue-naps; hopelessness-no; guilt-no; no-SI/HI. Coherent but tangential; ; thought process/associations-fairly organized; thought content-no overt psychosis; fund of knowledge-grossly intact; memory-grossly intact; racing thoughts-no; rumination-no; worry-"just the bone pain and my eyes"; no-AH,VH,CH,OH,TH;paranoia-no;delusions-no; IOR-no; thought broadcasting/insertion/removal-no; A+Ox3. I/J-nl    RISK ASSESSMENTS:     Violence: low (1)     Addiction: low (1)  BIO/PSYCHO/SOCIAL AND RISK FORMULATION(S): 66 y/o [place] female with longstanding MDD, recurrent, GAD, and Panic D/O now in need of psychopharm follow up,and counseling though she has dropped out of same in the past.    ASSESSMENT:   Stable MS and functioning; angst re: her bone pain and vision impairment which has limited her functioining    DIAGNOSES:    MDD, recurrent, in partial remission; GAD; Panic D/O    PLAN: Continue meds as ordered. Will contact PCP. Next appt-12/28  Effexor XR 150 mg AM   Klonopin 1 mg twice daily, PRN - takes twice daily   .    Rozerem 8 mg HS - previously by PCP  Trazodone 100 mg HS - D/C'd by PCP     INFORMED CONSENT - for any new medication:   N/A     CARE PLAN/ EPISODES:  Linked Episodes   Type: Episode: Status: Noted: Resolved: Last update: Updated by:   [place] Active 08/16/2019  08/16/2019  3:39 PM Felix Pacini, RNCS      Comments:       COUNSELING AND COORDINATION OF CARE PROVIDED:   I spent a total of 30 minutes on this visit on the date of service (total time includes all activities performed on the date of service)      [place], RNCS

## 2022-05-25 ENCOUNTER — Ambulatory Visit (HOSPITAL_BASED_OUTPATIENT_CLINIC_OR_DEPARTMENT_OTHER): Payer: No Typology Code available for payment source

## 2022-05-25 ENCOUNTER — Other Ambulatory Visit: Payer: Self-pay

## 2022-05-25 ENCOUNTER — Encounter (HOSPITAL_BASED_OUTPATIENT_CLINIC_OR_DEPARTMENT_OTHER): Payer: Self-pay

## 2022-05-25 ENCOUNTER — Ambulatory Visit
Admission: RE | Admit: 2022-05-25 | Discharge: 2022-05-25 | Disposition: A | Discharge status: Home / Self Care | Payer: No Typology Code available for payment source | Attending: Student in an Organized Health Care Education/Training Program | Admitting: Student in an Organized Health Care Education/Training Program

## 2022-05-25 VITALS — BP 110/68 | HR 78 | Temp 97.2°F | Wt 195.0 lb

## 2022-05-25 DIAGNOSIS — E669 Obesity, unspecified: Secondary | ICD-10-CM | POA: Diagnosis not present

## 2022-05-25 DIAGNOSIS — N1831 Chronic kidney disease, stage 3a: Secondary | ICD-10-CM

## 2022-05-25 DIAGNOSIS — B659 Schistosomiasis, unspecified: Secondary | ICD-10-CM

## 2022-05-25 DIAGNOSIS — N2581 Secondary hyperparathyroidism of renal origin: Secondary | ICD-10-CM

## 2022-05-25 DIAGNOSIS — G8929 Other chronic pain: Secondary | ICD-10-CM

## 2022-05-25 DIAGNOSIS — R058 Other specified cough: Secondary | ICD-10-CM | POA: Insufficient documentation

## 2022-05-25 DIAGNOSIS — I8390 Asymptomatic varicose veins of unspecified lower extremity: Secondary | ICD-10-CM

## 2022-05-25 DIAGNOSIS — E785 Hyperlipidemia, unspecified: Secondary | ICD-10-CM

## 2022-05-25 DIAGNOSIS — M25511 Pain in right shoulder: Secondary | ICD-10-CM

## 2022-05-25 DIAGNOSIS — M797 Fibromyalgia: Secondary | ICD-10-CM | POA: Diagnosis not present

## 2022-05-25 DIAGNOSIS — M25512 Pain in left shoulder: Secondary | ICD-10-CM | POA: Diagnosis not present

## 2022-05-25 DIAGNOSIS — J309 Allergic rhinitis, unspecified: Secondary | ICD-10-CM | POA: Diagnosis not present

## 2022-05-25 DIAGNOSIS — R7303 Prediabetes: Secondary | ICD-10-CM

## 2022-05-25 DIAGNOSIS — M545 Low back pain, unspecified: Secondary | ICD-10-CM

## 2022-05-25 DIAGNOSIS — Z0000 Encounter for general adult medical examination without abnormal findings: Secondary | ICD-10-CM

## 2022-05-25 DIAGNOSIS — L299 Pruritus, unspecified: Secondary | ICD-10-CM

## 2022-05-25 DIAGNOSIS — I10 Essential (primary) hypertension: Secondary | ICD-10-CM | POA: Diagnosis not present

## 2022-05-25 DIAGNOSIS — F330 Major depressive disorder, recurrent, mild: Secondary | ICD-10-CM

## 2022-05-25 LAB — PTH INTACT WITH CALCIUM
PTH INTACT CALCIUM: 9.8 mg/dL (ref 8.5–10.5)
PTH INTACT: 66 pg/mL — ABNORMAL HIGH (ref 15–65)

## 2022-05-25 LAB — HEMOGLOBIN A1C
ESTIMATED AVERAGE GLUCOSE: 134 mg/dL (ref 74–160)
HEMOGLOBIN A1C: 6.3 % — ABNORMAL HIGH (ref 4.0–5.6)

## 2022-05-25 MED ORDER — TRUFORM STOCKINGS 10-20MMHG MISC
0 refills | Status: AC
Start: 2022-05-25 — End: 2023-05-23

## 2022-05-25 MED ORDER — ALBUTEROL SULFATE HFA 108 (90 BASE) MCG/ACT IN AERS
2.0000 | INHALATION_SPRAY | Freq: Four times a day (QID) | RESPIRATORY_TRACT | 0 refills | Status: DC | PRN
Start: 2022-05-25 — End: 2022-06-03

## 2022-05-25 MED ORDER — FLUTICASONE PROPIONATE 50 MCG/ACT NA SUSP
1.0000 | Freq: Every day | NASAL | 3 refills | Status: DC
Start: 2022-05-25 — End: 2022-06-03

## 2022-05-25 MED ORDER — LORATADINE 10 MG PO TABS
10.0000 mg | ORAL_TABLET | Freq: Every day | ORAL | 2 refills | Status: DC
Start: 2022-05-25 — End: 2022-06-03

## 2022-05-25 MED ORDER — VENLAFAXINE HCL ER 225 MG PO TB24
225.0000 mg | ORAL_TABLET | Freq: Every day | ORAL | 2 refills | Status: DC
Start: 2022-05-25 — End: 2022-05-27

## 2022-05-25 NOTE — Prior Authorization (Signed)
Patient Insurance: HSNO [id number] (HSN [employer])  Member ID: [id number]  BIN: [id number]  PCN: [id number]  Group: [id number]  Prior Authorization for: Venlafaxine 225mg  Er

## 2022-05-25 NOTE — Progress Notes (Signed)
Chief Complaint:   Sara Snyder is a 66 year old year-old female w/ HTN, HLD, prediabetes, palpitations, obesity, secondary hyperparathyroidism, labrynthitis, allergic rhinitis, CKDIII, panic d/o, MDD, anxiety, osteopenia, arthralgias, chronic cough, insomnia, recurrent epistaxis, subclinical hypothyroid, vit d deficiency, b/l renal artery stenosis, and GERD who presents for follow up.     Last seen by me 12/22/21->  -HLD: Started atorvastatin for ASCVD greater than 15%  -DOE: No worsening or improvement, likely due to deconditioning given negative work-up, encouraged weight loss  -Obesity: Started Trulicity 6.27 mg  -CKD: Repeat microalbuminuria, elevated last visit  -Cervical cancer screening: Discussed no further need for Paps  -Preoperative exam: Medically optimized  -Polyarthralgia: Rheumatology visit, continue Tylenol as needed  -Carpal tunnel syndrome: Difficulty tolerating wrist brace, consider Ortho follow-up if symptoms not improving with wrist brace     Interval hx->   -7/5 rheumatology OV: Presentation more consistent with fibromyalgia, additional labs, x-rays, consider trial of prednisone; started calcium for osteopenia  -7/6 TE: Microalbuminuria normal  -7/10 rheumatology OV: X-rays consistent with degenerative changes, schistosomiasis antibody positive, gave treatment with praziquantel; referred to PT for low back pain; encouraged mindfulness practice for diffuse pain, consider duloxetine, gabapentin, pregabalin  -8/2 nutrition televisit: Requested referral to benefit program to get her own fresh foods  -MyChart messages and televisit with Dr. Edison Pace about risks and benefit of Trulicity for weight loss  -8/15 cataract surgery of right eye  -8/16, 8/24 ophtho postop appointment, doing well  -8/31 missed appointment with psychiatry  -9/5 office visit with Dr. Edison Pace: Preop for eye surgery; stopped statin given body aches which resolved with cessation  -9/7 TE: A1c prediabetic range  -9/13 televisit with  nutrition: Skipping meals because she is unsure what she can eat; having dizziness  -9/15 office visit with cardiology for palpitations missed  -9/19 cataract surgery  -10/2 dermatology office visit for rash missed  -10/for cataract surgery  -10/5 postop with Optho: doing well  -10/18 office visit with cardiology: Palpitations likely related to anxiety and not cardiac; okay to hold off on statin and aspirin for primary prevention at this time; advised weight loss  -10/19 Optho follow-up: Doing well postop  -11/9 MyChart message from daughter: Flu positive, having continuous dizziness, recommended in person visit  -11/10 office visit with PA Derry Lory: Chronic intermittent vertigo for many years, takes meclizine sometimes only once a day but when she has colds has to take it every day every 8 hours; try Reglan in place of meclizine follow-up 1 week or sooner if symptoms worsen; requested PPI refill for GERD  -11/20 MyChart message: Medication refill, mom more depressed and requesting psychiatry referral; requesting muscle relaxer  -11/28 psychiatry follow-up:     Today->  Problem List Items Addressed This Visit          Cardiac and Vasculature    Essential hypertension     BP well controlled today in clinic. Continue chlorthalidone 1m daily, amlodipine/valsartan 5/320 daily, atenolol 100 mg daily.         Hyperlipidemia     Lipids last checked 10/2021 with ASCVD greater than 15%.  Started atorvastatin 20 mg 12/2021.  Subsequently stopped by other provider 02/2021 due to body aches that resolved with cessation of statin.  Per cardiology 03/2022, okay to hold off on statin for now.  Continue to encourage exercise and diet changes.            Endocrine and Metabolic    [place] hyperparathyroidism ([place]  DC due for repeat calcium and PTH in January, will get today.         Relevant Orders    PTH INTACT WITH CALCIUM (Completed)    Obesity (BMI 30-39.9)     Deferred talking about in detail today due to other  issues discussed, but confirmed not currently taking Trulicity.  Will readdress next visit.         Prediabetes     A1c 6.4% when last checked 02/2022.  Following with nutrition and has been working on making diet changes.  Given borderline when last checked, will repeat today.         Relevant Orders    HEMOGLOBIN A1C (Completed)       ENT    Allergic rhinitis    Relevant Medications    loratadine (CLARITIN) 10 MG tablet    fluticasone (FLONASE) 50 MCG/ACT nasal spray       Genitourinary and Reproductive    Stage 3a chronic kidney disease     eGFR stable between 40 and 50.  Microalbuminuria normal when last checked 12/2021.  PTH and calcium today.  Continue to monitor.            Health Encounters    Healthcare maintenance     Due for zoster, RSV, flu, COVID.  Will defer vaccines today given recovering from recent URI.  Additionally due for mammogram in February, ordered but not yet scheduled, gave number today.            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            Mental Health    Mild episode of recurrent major depressive disorder (HCC)     Mood has not been good recently.  Daughter feels like venlafaxine is no longer working.  Suspected to have fibromyalgia per rheumatology evaluation for all over body pains and concurrent depression.  Will trial increasing venlafaxine could to 225 mg daily.  No SI/HI today.      Plan:  -Increase venlafaxine to 225 mg daily  -Does not connect with current psychologist, wants to be in therapy but requesting new provider            Musculoskeletal and Injuries    Chronic pain of both shoulders     X-rays with OA.  Referral to Ortho to consider steroid injections.  Would benefit from PT. has old referral in place, but given it is from January, will place new referral.         Relevant Orders    REFERRAL TO ORTHOPEDICS (INT) (Completed)    REFERRAL TO PHYSICAL THERAPY (INT) (Completed)     Fibromyalgia     Has multiple focal areas of pain in addition to all over body pain.  Underwent rheumatologic workup and evaluation by rheumatology who felt her symptoms are more consistent with osteoarthritis and fibromyalgia.  Currently on venlafaxine 150 mg daily but will try increasing to 225 mg daily.  Could consider Lyrica in the future if continuing to have pain symptoms.    Plan:  -Increase venlafaxine to 225 mg daily  -Continue to encourage exercise and therapy  -Consider Lyrica in the future if pain still poorly controlled         Relevant Medications    venlafaxine (EFFEXOR-XR) 150 MG 24 hr capsule    venlafaxine (EFFEXOR-XR) 75 MG 24 hr capsule    Chronic bilateral low back  pain without sciatica     Intermittent severe low back pain.  X-rays with degenerative changes in lumbar spine.  Per history, intermittent flares sound consistent with muscle spasms.  Reports muscle relaxants worked well during these episodes.  Educated that these are not great long-term medications but okay to use in the setting of acute flare as long as she is not overly sedated.  Recommended that preventing flares through physical therapy and increased exercise is better long-term treatment.  Referred to PT already not yet scheduled.  No red flag symptoms today, normal strength and sensation in lower extremities.    Plan:  -Encourage PT and increased exercise, weight loss  -Okay for low-dose muscle relaxers in the setting of acute flares  -Tylenol, diclofenac gel as needed            Pulmonary and Pneumonias    Post-viral cough syndrome     HPI:  -Has had intermittent chronic problems with cough in the past  -Recently had flu 11/9  -Since then has had new productive cough has been continuing to bother her  -Additionally feels like she has phlegm stuck in her throat that she cannot get up  -No changes in baseline dyspnea on exertion thought to be related to deconditioning  -Taking omeprazole for GERD symptoms and notes symptoms are  well-controlled  -Does have rhinorrhea and postnasal drip    Assessment:  Satting well on room air and does appear comfortable breathing.  However lungs with diffuse end expiratory wheezes bilaterally.  No history of asthma or COPD, suspect this is reactive airway disease in the setting of recent URI.  Suspect cough is a combination of postnasal drip and reactive airway disease.  Will try systemic antihistamine, Flonase, and albuterol every 6 hours.  Additionally, will get chest x-ray to rule out pneumonia given productive nature of cough.    Plan:  -Loratadine, Flonase, albuterol  -Chest x-ray to rule out pneumonia  -2-week office visit follow-up to evaluate for improvement in symptoms and lung exam         Relevant Medications    albuterol HFA 108 (90 Base) MCG/ACT inhaler    Other Relevant Orders    XR CHEST 2 VIEWS (Completed)       Other    Varicose veins of calf     Having intermittent pain and occasional mild swelling in lower extremities.  Varicose veins notable on exam.  Suspect mild venous insufficiency and pain from varicose veins.  Reassured her and educated that the best way to prevent worsening is using compression stockings and exercise.  Provided prescription.  No concerns for volume overload at this time.         Relevant Medications    TruForm Stockings 10-57mHg (COMPRESSION STOCKINGS)     Other Visit Diagnoses       Itching        Itching over her trunk with scattered pinpoint red spots. Unable to fully asses today.  Instructed to reach out if not improving with loratadine.          PHYSICAL EXAMINATION:  BP 110/68   Pulse 78   Temp 97.2 F (36.2 C) (Temporal)   Wt 88.5 kg (195 lb)   SpO2 96%   BMI 40.38 kg/m   General: well appearing middle-aged female in no acute distress  HEENT: normocephalic/atraumatic, MMM, EOMI  Cardiac: RRR, normal s1/s2, no MRG  Pulm: normal work of breathing, diffuse end expiratory wheeze bilaterally  Abdomen: positive bowel sounds, soft,  non distended, no  TTP  Extremities: Trace peripheral edema  Neuro: A&O, CN grossly in tact, normal strength and sensation in bilateral lower extremities  Psych: Depressed mood and congruent affect     Follow-up: 2 weeks for cough and wheezing with another provider, then 2-3 months with me for chronic problems     Darl Pikes, MD, MPH  Internal Medicine [phone]

## 2022-05-25 NOTE — Progress Notes (Signed)
[facility] sent patient a MyChart message in response to positive SDOH questionnaire with relevant resources and PRC contact method- See full message in Chart Review    Care Plan: Patient Resource Coordination [facility])        Problem: [place] PATIENT RESOURCE COORDINATION         Goal: Food Insecurity         Task: Food Stamps / SNAP-EBT    Due Date: 06/01/2022   Responsible User: Charlann Noss        Task: Pantries & Soup Kitchen    Due Date: 06/01/2022   Responsible User: Charlann Noss

## 2022-05-25 NOTE — Patient Instructions (Addendum)
Physical therapy: [phone]   Ophtho: [phone]  eye glasses

## 2022-05-26 ENCOUNTER — Telehealth (HOSPITAL_BASED_OUTPATIENT_CLINIC_OR_DEPARTMENT_OTHER): Payer: Self-pay

## 2022-05-26 NOTE — Telephone Encounter (Signed)
Spoke with patient using a telephone interpreter  Britta Mccreedy [phone]  Relayed results  Discussed diabetes, she would like to talk more about medication to help treat this or avoid becoming diabetic  Relayed importance of lifestyle changes and reduction of carbs in diet  Will try to help mom make the changed  Asked about follow up, yes apt in place for 12/18, she will be here  Verbalized back and agrees with plan

## 2022-05-26 NOTE — Telephone Encounter (Signed)
Hi, can you please call Sara Snyder and her daughter Sara Snyder regarding the following results and plan:    Chest x-ray did not show any signs of pneumonia.  Please use the loratadine, Flonase, and albuterol inhaler.  If your breathing or cough gets worse in the interim, please do not hesitate to reach out or go to the emergency room if the breathing is severely bad.  Calcium levels are stable.  Nothing to do for now.  A1c, diabetes number, is 6.3 which is slightly improved from 6.4 when it was last checked.  This is in the prediabetes range still which is good that she has not progressed to diabetes.  To prevent progression to diabetes please continue to increase exercise as able and continue to work on decreasing carbohydrates such as bread, rice, pasta, sugary beverages, and sugary foods.    Please let me know if they have any questions.  Thank you!

## 2022-05-27 ENCOUNTER — Encounter (HOSPITAL_BASED_OUTPATIENT_CLINIC_OR_DEPARTMENT_OTHER): Payer: Self-pay

## 2022-05-27 DIAGNOSIS — M797 Fibromyalgia: Secondary | ICD-10-CM

## 2022-05-27 MED ORDER — VENLAFAXINE HCL ER 150 MG PO CP24
150.0000 mg | ORAL_CAPSULE | Freq: Every day | ORAL | 2 refills | Status: DC
Start: 2022-05-27 — End: 2022-06-03

## 2022-05-27 MED ORDER — VENLAFAXINE HCL ER 75 MG PO CP24
75.0000 mg | ORAL_CAPSULE | Freq: Every day | ORAL | 2 refills | Status: DC
Start: 2022-05-27 — End: 2022-06-03

## 2022-05-27 NOTE — Prior Authorization (Signed)
No Go or Cancelled:    No Go or Canceled Prescription: Other  Other - Comment: Ins covers venlafaxine ER capsules in separate scripts of 150mg  and 75mg  to equal the 225mg  dose.     Please review and send scripts if change is appropriate

## 2022-06-01 DIAGNOSIS — M545 Low back pain, unspecified: Secondary | ICD-10-CM | POA: Insufficient documentation

## 2022-06-01 DIAGNOSIS — I8390 Asymptomatic varicose veins of unspecified lower extremity: Secondary | ICD-10-CM | POA: Insufficient documentation

## 2022-06-01 DIAGNOSIS — R058 Other specified cough: Secondary | ICD-10-CM | POA: Insufficient documentation

## 2022-06-01 NOTE — Assessment & Plan Note (Signed)
Detected by rheumatology 12/2021 with subsequent treatment with praziquantel.

## 2022-06-01 NOTE — Assessment & Plan Note (Signed)
Has multiple focal areas of pain in addition to all over body pain.  Underwent rheumatologic workup and evaluation by rheumatology who felt her symptoms are more consistent with osteoarthritis and fibromyalgia.  Currently on venlafaxine 150 mg daily but will try increasing to 225 mg daily.  Could consider Lyrica in the future if continuing to have pain symptoms.    Plan:  -Increase venlafaxine to 225 mg daily  -Continue to encourage exercise and therapy  -Consider Lyrica in the future if pain still poorly controlled

## 2022-06-01 NOTE — Assessment & Plan Note (Signed)
BP well controlled today in clinic. Continue chlorthalidone 25mg  daily, amlodipine/valsartan 5/320 daily, atenolol 100 mg daily.

## 2022-06-01 NOTE — Assessment & Plan Note (Signed)
eGFR stable between 40 and 50.  Microalbuminuria normal when last checked 12/2021.  PTH and calcium today.  Continue to monitor.

## 2022-06-01 NOTE — Assessment & Plan Note (Signed)
Due for zoster, RSV, flu, COVID.  Will defer vaccines today given recovering from recent URI.  Additionally due for mammogram in February, ordered but not yet scheduled, gave number today.

## 2022-06-01 NOTE — Assessment & Plan Note (Addendum)
HPI:  -Has had intermittent chronic problems with cough in the past  -Recently had flu 11/9  -Since then has had new productive cough has been continuing to bother her  -Additionally feels like she has phlegm stuck in her throat that she cannot get up  -No changes in baseline dyspnea on exertion thought to be related to deconditioning  -Taking omeprazole for GERD symptoms and notes symptoms are well-controlled  -Does have rhinorrhea and postnasal drip    Assessment:  Satting well on room air and does appear comfortable breathing.  However lungs with diffuse end expiratory wheezes bilaterally.  No history of asthma or COPD, suspect this is reactive airway disease in the setting of recent URI.  Suspect cough is a combination of postnasal drip and reactive airway disease.  Will try systemic antihistamine, Flonase, and albuterol every 6 hours.  Additionally, will get chest x-ray to rule out pneumonia given productive nature of cough.    Plan:  -Loratadine, Flonase, albuterol  -Chest x-ray to rule out pneumonia  -2-week office visit follow-up to evaluate for improvement in symptoms and lung exam

## 2022-06-01 NOTE — Assessment & Plan Note (Signed)
DC due for repeat calcium and PTH in January, will get today.

## 2022-06-01 NOTE — Assessment & Plan Note (Signed)
X-rays with OA.  Referral to Ortho to consider steroid injections.  Would benefit from PT. has old referral in place, but given it is from January, will place new referral.

## 2022-06-01 NOTE — Assessment & Plan Note (Signed)
Having intermittent pain and occasional mild swelling in lower extremities.  Varicose veins notable on exam.  Suspect mild venous insufficiency and pain from varicose veins.  Reassured her and educated that the best way to prevent worsening is using compression stockings and exercise.  Provided prescription.  No concerns for volume overload at this time.

## 2022-06-01 NOTE — Assessment & Plan Note (Signed)
Lipids last checked 10/2021 with ASCVD greater than 15%.  Started atorvastatin 20 mg 12/2021.  Subsequently stopped by other provider 02/2021 due to body aches that resolved with cessation of statin.  Per cardiology 03/2022, okay to hold off on statin for now.  Continue to encourage exercise and diet changes.

## 2022-06-01 NOTE — Assessment & Plan Note (Signed)
Mood has not been good recently.  Daughter feels like venlafaxine is no longer working.  Suspected to have fibromyalgia per rheumatology evaluation for all over body pains and concurrent depression.  Will trial increasing venlafaxine could to 225 mg daily.  No SI/HI today.      Plan:  -Increase venlafaxine to 225 mg daily  -Does not connect with current psychologist, wants to be in therapy but requesting new provider

## 2022-06-01 NOTE — Assessment & Plan Note (Signed)
Intermittent severe low back pain.  X-rays with degenerative changes in lumbar spine.  Per history, intermittent flares sound consistent with muscle spasms.  Reports muscle relaxants worked well during these episodes.  Educated that these are not great long-term medications but okay to use in the setting of acute flare as long as she is not overly sedated.  Recommended that preventing flares through physical therapy and increased exercise is better long-term treatment.  Referred to PT already not yet scheduled.  No red flag symptoms today, normal strength and sensation in lower extremities.    Plan:  -Encourage PT and increased exercise, weight loss  -Okay for low-dose muscle relaxers in the setting of acute flares  -Tylenol, diclofenac gel as needed

## 2022-06-01 NOTE — Assessment & Plan Note (Signed)
Deferred talking about in detail today due to other issues discussed, but confirmed not currently taking Trulicity.  Will readdress next visit.

## 2022-06-01 NOTE — Assessment & Plan Note (Signed)
A1c 6.4% when last checked 02/2022.  Following with nutrition and has been working on making diet changes.  Given borderline when last checked, will repeat today.

## 2022-06-03 ENCOUNTER — Other Ambulatory Visit: Payer: Self-pay

## 2022-06-03 ENCOUNTER — Ambulatory Visit: Payer: No Typology Code available for payment source | Attending: Internal Medicine

## 2022-06-03 VITALS — BP 128/70 | Temp 98.1°F | Wt 195.0 lb

## 2022-06-03 DIAGNOSIS — R058 Other specified cough: Secondary | ICD-10-CM | POA: Diagnosis present

## 2022-06-03 MED ORDER — AMITRIPTYLINE HCL 25 MG PO TABS
25.0000 mg | ORAL_TABLET | Freq: Every evening | ORAL | 0 refills | Status: DC
Start: 2022-06-03 — End: 2022-06-03

## 2022-06-03 MED ORDER — BUDESONIDE-FORMOTEROL FUMARATE 160-4.5 MCG/ACT IN AERO
2.0000 | INHALATION_SPRAY | Freq: Two times a day (BID) | RESPIRATORY_TRACT | 1 refills | Status: DC
Start: 2022-06-03 — End: 2023-11-29

## 2022-06-03 MED ORDER — VENLAFAXINE HCL ER 150 MG PO CP24
150.0000 mg | ORAL_CAPSULE | Freq: Every day | ORAL | 2 refills | Status: DC
Start: 2022-06-03 — End: 2022-09-11

## 2022-06-03 MED ORDER — AMITRIPTYLINE HCL 10 MG PO TABS
10.0000 mg | ORAL_TABLET | Freq: Every evening | ORAL | 0 refills | Status: DC
Start: 2022-06-03 — End: 2022-06-11

## 2022-06-03 NOTE — Progress Notes (Signed)
HPI    Sara Snyder is a 66 year old female here for f/u post-viral cough x over a month. Accompanied by one of her daughters. Telephone [place] interpreter used.    H/o chronic cough since coming to [name], was told it was allergies, but now it's winter so she doesn't think that is the problem.There is a lot of dust coming from their heating system though.     The current cough started with cold symptoms about a month ago, which have since resolved. Cough remains severe, throughout the day, keeping her up at night. Cough is now causing pain in her belly and chest with coughing. Hard to breathe when she coughs. Denies chest or abdominal pain when not coughing. Denies fever, chills, sweats, sputum production, exhaustion, or loss of appetite. Nonsmoker, no history of pneumonia or asthma.     Normal CXR for this complaint on 05/25/22. Took loratadine, cetirizine, Flonase, albuterol, but feels they made symptoms worse. Stopped them all. Tried an OTC cough suppressant but it didn't help either. Daughter would like to know if she can try or a nebulizer or humidifier.      She is also worried b/c her neighbor also had a cough, and she died.       Current Outpatient Medications:     budesonide-formoterol (SYMBICORT) 160-4.5 MCG/ACT inhaler, Inhale 2 puffs into the lungs in the morning and 2 puffs before bedtime. Use 1 puff prn during the day. Rinse mouth after use.., Disp: 1 each, Rfl: 1    amitriptyline (ELAVIL) 10 MG tablet, Take 1 tablet by mouth nightly, Disp: 30 tablet, Rfl: 0    venlafaxine (EFFEXOR-XR) 150 MG 24 hr capsule, Take 1 capsule by mouth in the morning., Disp: 30 capsule, Rfl: 2    venlafaxine (EFFEXOR-XR) 75 MG 24 hr capsule, Take 1 capsule by mouth in the morning., Disp: 30 capsule, Rfl: 2    TruForm Stockings 10-57mmHg (COMPRESSION STOCKINGS), B/l compression stocking, Disp: 2 each, Rfl: 0    clonazePAM (KLONOPIN) 1 MG tablet, 1 tablet two times daily, PRN, Disp: 60 tablet, Rfl: 1    omeprazole (PRILOSEC)  20 MG capsule, Take 1 capsule by mouth in the morning., Disp: 30 capsule, Rfl: 0    atenolol (TENORMIN) 100 MG tablet, Take 1 tablet by mouth nightly, Disp: 90 tablet, Rfl: 1    meclizine (ANTIVERT) 25 MG TABS, Take 1 tablet by mouth every 8 (eight) hours as needed, Disp: 90 tablet, Rfl: 0    chlorthalidone (HYGROTEN) 25 MG tablet, Take 1 tablet by mouth in the morning., Disp: 90 tablet, Rfl: 0    amlodipine-valsartan (EXFORGE) 5-320 MG per tablet, Take 1 tablet by mouth in the morning., Disp: 30 tablet, Rfl: 11    levothyroxine (SYNTHROID) 50 MCG tablet, Take 1 tablet by mouth every morning before breakfast, Disp: 90 tablet, Rfl: 3    diclofenac (VOLTAREN) 1 % GEL Gel, Apply 2 g topically 4 (four) times daily as needed, Disp: 300 g, Rfl: 3    calcium citrate-citamin D (CALCIUM CITRATE +) 315-5 MG-MCG tablet, Take 1 tablet by mouth in the morning and 1 tablet before bedtime., Disp: 180 tablet, Rfl: 3    latanoprost (XALATAN) 0.005 % ophthalmic solution, Place 1 drop into both eyes nightly, Disp: 2.5 mL, Rfl: 12    Elastic Bandages & Supports (RA WRIST BRACE ADJ RIGHT L/XL) MISC, Use as able for carpal tunnel syndrome, Disp: 1 each, Rfl: 0    cholecalciferol (VITAMIN D3) 2000 UNIT tablet, Take  1 tablet by mouth in the morning and 1 tablet before bedtime., Disp: 180 tablet, Rfl: 3    acetaminophen (TYLENOL) 500 MG tablet, Take 1 tablet by mouth every 4 (four) hours as needed for Pain, Disp: 60 tablet, Rfl: 3    Review of Patient's Allergies indicates:   Pollen extract          Cough    Past Medical History:  09/11/2018: Chronic kidney disease, stage III (moderate) (HCC)  08/14/2019: COVID-19      Comment:  Risk category:   high  Date of symptom onset: 08/12/19                 Tested? Positive  Risk factors: CKD, BMI > 30, HTN                 Clinical Course First date of symptoms:  08/12/19                 08/15/19: (DOI 3): Pt states she is feeling well. Slight                ST and sinus congestion. No cough, fever,  SOB, DOE or                edema. F/U in 3 days.  08/18/2019: DOI 7- Feeling better                over all, stable vitals, CM f/u in 2 days. 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)  02/20/2021: Osteopenia  11/29/2017: Panic disorder  No date: Wears eyeglasses    Patient Active Problem List:     Essential hypertension     Labyrinthitis     Panic disorder     Hyperlipidemia     Chronic cough     Mild episode of recurrent major depressive disorder (HCC)     Allergic rhinitis     Chronic pain of both shoulders     Stage 3a chronic kidney disease     Anxiety state     Palpitations     Psychophysiological insomnia     Secondary hyperparathyroidism (HCC)     Osteopenia     Carpal tunnel syndrome of right wrist     Fibromyalgia     Hair loss     Vitamin D deficiency     Obesity (BMI 30-39.9)     Bilateral renal artery stenosis (HCC)     DOE (dyspnea on exertion)     Recurrent epistaxis     Gastroesophageal reflux disease without esophagitis     Other specified hypothyroidism     Combined forms of age-related cataract of right eye     Preoperative examination     Cervical cancer screening     Prediabetes     Healthcare maintenance     Schistosomiasis     Intraoperative floppy iris syndrome (IFIS)     Combined forms of age-related cataract of left eye     Post-viral cough syndrome     Varicose veins of calf     Chronic bilateral low back pain without sciatica      family history is not on file.    Physical Exam    BP 128/70   Temp 98.1 F (36.7 C) (Temporal)  Wt 88.5 kg (195 lb)   BMI 40.38 kg/m     Constitutional: Alert and oriented, appears well, anxious affect, some agitation and pressured speech but redirectable after 1-2 minutes. Occasional coughing jags, no sputum production  Lungs clear to auscultation, no wheezes/rhonchi/rales, good air exchange bilaterally    Assessment and  Plan    Deardra was seen today for follow up and other.    Diagnoses and all orders for this visit:    Post-viral cough syndrome  -     Cancel: CBC, PLATELET & DIFFERENTIAL  -     budesonide-formoterol (SYMBICORT) 160-4.5 MCG/ACT inhaler; Inhale 2 puffs into the lungs in the morning and 2 puffs before bedtime. Use 1 puff prn during the day. Rinse mouth after use..  -     Discontinue: amitriptyline (ELAVIL) 25 MG tablet; Take 1 tablet by mouth nightly  -     amitriptyline (ELAVIL) 10 MG tablet; Take 1 tablet by mouth nightly    Lungs are clear, which is an improvement from 12/4 exam. No s/s bacterial infection or other serious complication of what started as a viral URI. Reassurance provided to patient and daughter on that point. Cough is likely a combination of reactive airways exacerbation and chronic neurogenic cough. Trial humidifier in bedroom, add ICS-LABA for both controller and rescue inhaler, amitriptyline 10 mg at hs for neurogenic component. Stop loratadine, Flonase, albuterol to reduce polypharmacy, and they have not effective for current symptoms. States clonazepam is not sedating for her, but they will need to monitor for additive sedating effects of clonazepam + amitriptyline. Hold venlafaxine dose at 150 mg while doing trial of amitriptyline, to lower risk of serotonin syndrome. Reviewed potential side effects and warning symptoms with daughter and patient, daughter showed good understanding. F/u one week to monitor symptoms and any side effects, call sooner with questions or concerns.     Total time was 39 minutes. That includes chart review before the visit, the actual patient visit, and time spent on documentation after the visit     Serena Colonel, CNP

## 2022-06-07 ENCOUNTER — Other Ambulatory Visit: Payer: Self-pay

## 2022-06-07 ENCOUNTER — Emergency Department (HOSPITAL_BASED_OUTPATIENT_CLINIC_OR_DEPARTMENT_OTHER): Payer: No Typology Code available for payment source

## 2022-06-07 ENCOUNTER — Emergency Department
Admission: EM | Admit: 2022-06-07 | Discharge: 2022-06-07 | Disposition: A | Discharge status: Home / Self Care | Payer: No Typology Code available for payment source | Attending: Emergency Medicine | Admitting: Emergency Medicine

## 2022-06-07 ENCOUNTER — Encounter (HOSPITAL_BASED_OUTPATIENT_CLINIC_OR_DEPARTMENT_OTHER): Payer: Self-pay

## 2022-06-07 DIAGNOSIS — J209 Acute bronchitis, unspecified: Secondary | ICD-10-CM

## 2022-06-07 DIAGNOSIS — R053 Chronic cough: Secondary | ICD-10-CM

## 2022-06-07 DIAGNOSIS — Z8679 Personal history of other diseases of the circulatory system: Secondary | ICD-10-CM

## 2022-06-07 DIAGNOSIS — R051 Acute cough: Secondary | ICD-10-CM

## 2022-06-07 DIAGNOSIS — J40 Bronchitis, not specified as acute or chronic: Secondary | ICD-10-CM

## 2022-06-07 MED ORDER — PREDNISONE 50 MG PO TABS
60.0000 mg | ORAL_TABLET | Freq: Once | ORAL | Status: AC
Start: 2022-06-07 — End: 2022-06-07
  Administered 2022-06-07: 60 mg via ORAL
  Filled 2022-06-07: qty 1

## 2022-06-07 MED ORDER — BENZONATATE 100 MG PO CAPS
100.0000 mg | ORAL_CAPSULE | Freq: Three times a day (TID) | ORAL | 0 refills | Status: DC | PRN
Start: 2022-06-07 — End: 2022-06-07

## 2022-06-07 MED ORDER — PREDNISONE 50 MG PO TABS
50.0000 mg | ORAL_TABLET | Freq: Every day | ORAL | 0 refills | Status: DC
Start: 2022-06-07 — End: 2022-06-07

## 2022-06-07 MED ORDER — ALBUTEROL SULFATE HFA 108 (90 BASE) MCG/ACT IN AERS
2.00 | INHALATION_SPRAY | Freq: Once | RESPIRATORY_TRACT | Status: AC
Start: 2022-06-07 — End: 2022-06-07
  Administered 2022-06-07: 2 via RESPIRATORY_TRACT
  Filled 2022-06-07: qty 6.7

## 2022-06-07 MED ORDER — BENZONATATE 100 MG PO CAPS
100.0000 mg | ORAL_CAPSULE | Freq: Three times a day (TID) | ORAL | 0 refills | Status: DC | PRN
Start: 2022-06-07 — End: 2022-06-11

## 2022-06-07 MED ORDER — PREDNISONE 50 MG PO TABS
50.0000 mg | ORAL_TABLET | Freq: Every day | ORAL | 0 refills | Status: DC
Start: 2022-06-07 — End: 2022-06-11

## 2022-06-07 NOTE — ED Triage Note (Signed)
Self presents to ED c/o cough and sore throat x1 month, denies any other sx, took omeprazole and BP medications this morning, daughter gave mucinex at 3am. Pt refusing to talk wants daughter to speak for her, speech clear, speaking in full sentences.

## 2022-06-07 NOTE — Narrator Note (Signed)
Spacer teaching completed with interpreter.

## 2022-06-07 NOTE — Discharge Instructions (Signed)
Evaluated today for persistent cough.     Your chest xray did not show signs of pneumonia.     Read all provided instructions. Take all medications as prescribed. Call your PCP for follow up. Seek medical attention if you develop new or worsening symptoms or if you have any other concerns.

## 2022-06-07 NOTE — Narrator Note (Signed)
Patient Disposition  Patient education for diagnosis, medications, activity, diet and follow-up.  Patient left ED 11:45 AM.  Patient rep received written instructions.    Interpreter to provide instructions: Yes; Interpreter ID: daughter per patient request    Patient belongings with patient: YES    Have all existing LDAs been addressed? Yes    Have all IV infusions been stopped? N/A    Destination: Discharged to home. Pt verbalize understanding of all discharge instructions.

## 2022-06-07 NOTE — ED Provider Notes (Signed)
The patient was seen primarily by me. ED nursing record was reviewed. Select prior records as available electronically through the Epic record were reviewed.    HPI:    Sara Snyder is a 66 year old female patient with h/o HTN, HL, allergic rhinitis, anxiety/panic attacks, GERD who is presenting with persistent cough. Symptoms ongoing x 1 month.   Started with the flu in November and has a had a persistent dry cough since then.   No fevers. CP/SOB with coughing. Coughing does not worsen after meals and notes that she is on omeprazole.   Was seen by PCP x 2 and trialed zyrtec -->loratadine, flonase, albuterol.   Had an unremarkable CXR 2 weeks ago. Added Budesonide-Formoterol inhaler, amitriptyline without good effect.     ROS: Pertinent positives were reviewed as per the HPI above. All other systems were reviewed and are negative.  Nadean Corwin  Language of care: [place] Marya Amsler)  MRN: [id number]  PCP: Janna Arch, MD  Mode of arrival to ED: Relative.  Arrival time:     Chief complaint: Cough and Pharyngitis    Past Medical History/Problem list:  Past Medical History:  09/11/2018: Chronic kidney disease, stage III (moderate) (HCC)  08/14/2019: COVID-19      Comment:  Risk category:   high  Date of symptom onset: 08/12/19                 Tested? Positive  Risk factors: CKD, BMI > 30, HTN                 Clinical Course First date of symptoms:  08/12/19                 08/15/19: (DOI 3): Pt states she is feeling well. Slight                ST and sinus congestion. No cough, fever, SOB, DOE or                edema. F/U in 3 days.  08/18/2019: DOI 7- Feeling better                over all, stable vitals, CM f/u in 2 days. 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)  02/20/2021: Osteopenia  11/29/2017: Panic disorder  No date: Wears eyeglasses  Patient Active Problem List:      Essential hypertension     Labyrinthitis     Panic disorder     Hyperlipidemia     Chronic cough     Mild episode of recurrent major depressive disorder (HCC)     Allergic rhinitis     Chronic pain of both shoulders     Stage 3a chronic kidney disease     Anxiety state     Palpitations     Psychophysiological insomnia     Secondary hyperparathyroidism (HCC)     Osteopenia     Carpal tunnel syndrome of right wrist     Fibromyalgia     Hair loss     Vitamin D deficiency     Obesity (BMI 30-39.9)     Bilateral renal artery stenosis (HCC)     DOE (dyspnea on exertion)     Recurrent epistaxis     Gastroesophageal reflux disease without esophagitis  Other specified hypothyroidism     Combined forms of age-related cataract of right eye     Preoperative examination     Cervical cancer screening     Prediabetes     Healthcare maintenance     Schistosomiasis     Intraoperative floppy iris syndrome (IFIS)     Combined forms of age-related cataract of left eye     Post-viral cough syndrome     Varicose veins of calf     Chronic bilateral low back pain without sciatica    Past Surgical History: Past Surgical History:  No date: CATARACT REMOVAL INSERTION OF LENS; Right  10/2016: HEMORRHOID SURGERY  No date: OB ANTEPARTUM CARE CESAREAN DLVR & POSTPARTUM  Social History:   Social History     Socioeconomic History    Marital status: Married     Spouse name: Not on file    Number of children: Not on file    Years of education: Not on file    Highest education level: Not on file   Occupational History    Not on file   Tobacco Use    Smoking status: Former     Packs/day: 2.00     Years: 25.00     Additional pack years: 0.00     Total pack years: 50.00     Types: Cigarettes     Quit date: 10/27/1977     Years since quitting: 44.6    Smokeless tobacco: Never   Substance and Sexual Activity    Alcohol use: Not Currently    Drug use: Never    Sexual activity: Yes     Partners: Male   Other Topics Concern    Not on file   Social History  Narrative    Originally from [place], [place]    History of childhood trauma    Lives with family   Social Determinants of Health  Financial Resource Strain: Not on file  Food Insecurity: Not on file  Transportation Needs: Not on file  Physical Activity: Not on file  Stress: Not on file  Social Connections: Not on file  Intimate Partner Violence: Not on file  Housing Stability: Not on file   Allergies: Review of Patient's Allergies indicates:   Pollen extract          Cough  Immunizations:   Immunization History   Administered Date(s) Administered    Covid-19 Vaccine [employer] Cap) 02/06/2021    Covid-19 Vaccine [employer] - Purple Cap) 09/06/2019, 09/27/2019, 06/13/2020    Covid-19 Vaccine [employer] Bivalent 68yo>) 07/14/2021    Influenza Vaccine High Dose 0.47ml 65 And Older 07/14/2021    Influenza Virus Quad Presv Free Vacc 6 Mo and Older, IM 05/23/2018, 04/10/2019, 06/13/2020    PCV13 04/10/2019    PNEUMOCOCCAL POLYSACCHARIDE VACCINE v23 02/06/2021    Tdap 11/17/2017          Medications:  Prior to Admission Medications   Prescriptions Last Dose Informant Patient Reported? Taking?   Elastic Bandages & Supports (RA WRIST BRACE ADJ RIGHT L/XL) MISC   No No   Sig: Use as able for carpal tunnel syndrome   TruForm Stockings 10-15mmHg (COMPRESSION STOCKINGS)   No No   Sig: B/l compression stocking   acetaminophen (TYLENOL) 500 MG tablet   No No   Sig: Take 1 tablet by mouth every 4 (four) hours as needed for Pain   amitriptyline (ELAVIL) 10 MG tablet   No No   Sig: Take 1 tablet by  mouth nightly   amlodipine-valsartan (EXFORGE) 5-320 MG per tablet   No No   Sig: Take 1 tablet by mouth in the morning.   atenolol (TENORMIN) 100 MG tablet   No No   Sig: Take 1 tablet by mouth nightly   budesonide-formoterol (SYMBICORT) 160-4.5 MCG/ACT inhaler   No No   Sig: Inhale 2 puffs into the lungs in the morning and 2 puffs before bedtime. Use 1 puff prn during the day. Rinse mouth after use..   calcium citrate-citamin D (CALCIUM CITRATE  +) 315-5 MG-MCG tablet   No No   Sig: Take 1 tablet by mouth in the morning and 1 tablet before bedtime.   chlorthalidone (HYGROTEN) 25 MG tablet   No No   Sig: Take 1 tablet by mouth in the morning.   cholecalciferol (VITAMIN D3) 2000 UNIT tablet   No No   Sig: Take 1 tablet by mouth in the morning and 1 tablet before bedtime.   clonazePAM (KLONOPIN) 1 MG tablet   No No   Sig: 1 tablet two times daily, PRN   diclofenac (VOLTAREN) 1 % GEL Gel   No No   Sig: Apply 2 g topically 4 (four) times daily as needed   latanoprost (XALATAN) 0.005 % ophthalmic solution   No No   Sig: Place 1 drop into both eyes nightly   levothyroxine (SYNTHROID) 50 MCG tablet   No No   Sig: Take 1 tablet by mouth every morning before breakfast   meclizine (ANTIVERT) 25 MG TABS   No No   Sig: Take 1 tablet by mouth every 8 (eight) hours as needed   omeprazole (PRILOSEC) 20 MG capsule   No No   Sig: Take 1 capsule by mouth in the morning.   venlafaxine (EFFEXOR-XR) 150 MG 24 hr capsule   No No   Sig: Take 1 capsule by mouth in the morning.      Facility-Administered Medications: None     Physical Exam:   Patient Vitals for the past 99 hrs:   BP Temp Pulse Resp SpO2 Weight   06/07/22 1021 -- -- 84 -- -- --   06/07/22 0940 134/77 97.4 F 84 18 95 % 88 kg (194 lb 0.1 oz)     GENERAL:  WDWN, no acute distress, non-toxic   SKIN:  Warm & Dry, no rash, no petechia.  HEAD:  NCAT. Sclerae are anicteric and aninjected, oropharynx is clear with moist mucous membranes.  NECK:  Supple,  LUNGS: Mild diffuse wheezing b/l L>R   HEART:  RRR.  No murmurs, rubs, or gallops.   ABDOMEN:  Soft, NTND.  No masses.  No involuntary guarding or rebound.   EXTREMITIES:  No obvious deformities.  Warm and well perfused.  No cyanosis, no edema.   NEUROLOGIC:  Alert; moves all extremities; speaking in sentences. Normal gait without ataxia.   PSYCHIATRIC:  Appropriate for age, time of day, and situation    Medications Given in the ED:  Medications   albuterol HFA inhaler 2  puff (2 puffs Inhalation Given 06/07/22 1021)   predniSONE (DELTASONE) tablet 60 mg (60 mg Oral Given 06/07/22 1021)    Radiology Results:  CXR -reviewed independently -no acute cardiopulmonary disease process.    Lab Results (abnormal results only):  Labs Reviewed - No data to display Other Results/Old Record review (e.g. ECG):     ED Course and Medical Decision-making:  66 year old female patient who is presenting with persistent cough x 1  month. She is afebrile, and in no respiratory distress but noted to have wheezing on exam.  We repeated CXR today to assess for interval change/development of PNA and XRays reviewed independently without acute findings.    She is already on an inhaled corticosteroid.  Recommended continuing this and provided albuterol. Will trial a prednisone burst with Tessalon Perles.  Encouraged PCP follow-up.     Patient/family educated on diagnosis(es); she states understanding and agrees with plan of care   Reasons to return to the ED were reviewed in detail. She agrees with this plan and disposition.    Condition on Discharge: Improved and Stable    Diagnosis/Diagnoses:  Acute cough  Bronchitis      Posey BoyerGolien G Aerionna Moravek, MD  This Emergency Department patient encounter note was created using voice-recognition software and in real time during the ED visit.

## 2022-06-10 ENCOUNTER — Encounter (HOSPITAL_BASED_OUTPATIENT_CLINIC_OR_DEPARTMENT_OTHER): Payer: Self-pay

## 2022-06-10 DIAGNOSIS — R053 Chronic cough: Secondary | ICD-10-CM

## 2022-06-10 MED ORDER — GUAIFENESIN ER 600 MG PO TB12
1200.0000 mg | ORAL_TABLET | Freq: Two times a day (BID) | ORAL | 0 refills | Status: DC
Start: 2022-06-10 — End: 2022-06-11

## 2022-06-10 NOTE — Telephone Encounter (Signed)
TC to pt  Spoke with daughter [name]   Reports cough has not improved with prednisone and tessalon prescribed at the ED  'She needs something stronger'  Pt is getting anxious about it and cries  No fever  Daughter wonders if there is now an infection  Xray was negative  Cough is dry but now has green/yellow mucus from nose  Informed PCP is not in office today but message will be sent  Declined appt today  ED precautions reviewed    RN to inform provider    Dairl Ponder, RN, 06/10/2022

## 2022-06-11 ENCOUNTER — Ambulatory Visit: Payer: No Typology Code available for payment source | Attending: Internal Medicine

## 2022-06-11 ENCOUNTER — Encounter (HOSPITAL_BASED_OUTPATIENT_CLINIC_OR_DEPARTMENT_OTHER): Payer: Self-pay

## 2022-06-11 ENCOUNTER — Other Ambulatory Visit: Payer: Self-pay

## 2022-06-11 VITALS — BP 176/92 | HR 71 | Temp 97.3°F | Wt 198.0 lb

## 2022-06-11 DIAGNOSIS — Z23 Encounter for immunization: Secondary | ICD-10-CM | POA: Diagnosis not present

## 2022-06-11 DIAGNOSIS — R058 Other specified cough: Secondary | ICD-10-CM

## 2022-06-11 DIAGNOSIS — R053 Chronic cough: Secondary | ICD-10-CM

## 2022-06-11 LAB — BASIC METABOLIC PANEL
ANION GAP: 10 mmol/L (ref 10–22)
BUN (UREA NITROGEN): 20 mg/dL — ABNORMAL HIGH (ref 7–18)
CALCIUM: 10 mg/dL (ref 8.5–10.5)
CARBON DIOXIDE: 32 mmol/L (ref 21–32)
CHLORIDE: 99 mmol/L (ref 98–107)
CREATININE: 1.1 mg/dL (ref 0.4–1.2)
ESTIMATED GLOMERULAR FILT RATE: 55 mL/min — ABNORMAL LOW (ref >60)
Glucose Random: 81 mg/dL (ref 74–160)
POTASSIUM: 4.1 mmol/L (ref 3.5–5.1)
SODIUM: 142 mmol/L (ref 136–145)

## 2022-06-11 LAB — CBC WITH PLATELET
ABSOLUTE NRBC COUNT: 0 10*3/uL (ref 0.0–0.0)
HEMATOCRIT: 34.1 % (ref 34.1–44.9)
HEMOGLOBIN: 11 g/dL — ABNORMAL LOW (ref 11.2–15.7)
MEAN CORP HGB CONC: 32.3 g/dL (ref 31.0–37.0)
MEAN CORPUSCULAR HGB: 29.2 pg (ref 26.0–34.0)
MEAN CORPUSCULAR VOL: 90.5 fl (ref 80.0–100.0)
MEAN PLATELET VOLUME: 10.4 fL (ref 8.7–12.5)
NRBC %: 0 % (ref 0.0–0.0)
PLATELET COUNT: 342 10*3/uL (ref 150–400)
RBC DISTRIBUTION WIDTH STD DEV: 43.1 fL (ref 35.1–46.3)
RED BLOOD CELL COUNT: 3.77 M/uL — ABNORMAL LOW (ref 3.90–5.20)
WHITE BLOOD CELL COUNT: 10 10*3/uL (ref 4.0–11.0)

## 2022-06-11 MED ORDER — AMITRIPTYLINE HCL 25 MG PO TABS
25.0000 mg | ORAL_TABLET | Freq: Every evening | ORAL | 0 refills | Status: DC
Start: 2022-06-11 — End: 2022-07-27

## 2022-06-11 NOTE — Progress Notes (Unsigned)
Influenza Vaccine Procedure  June 11, 2022    1. Has the patient received the information for the influenza vaccine? Yes    2. Does the patient have any of the following contraindications?  Allergy to eggs? No  Allergic reaction to previous influenza vaccines? No  Any other problems to previous influenza vaccines? No  Paralyzed by Guillain-Barre syndrome?  No  Current moderate or severe illness? No    3. The vaccine has been administered in the usual fashion.     Immunization information reviewed. Current VIS reviewed and given to patient/ guardian. Verbal assent obtained from patient/ guardian.  See immunization/Injection module or chart review for date of publication and additional information. Verbal assent obtained from patient/guardian. Comfort measures for possible side effects reviewed.

## 2022-06-11 NOTE — Progress Notes (Signed)
HPI    Sara Snyder is a 66 year old female who presents w/ongoing cough x over one month, this is her 3rd visit. Accompanied by her husband today, in-person [place] interpreter in room.     Seen at ED on 12/17 for same, repeat CXR clear. Didn't respond much to oral prednisone burst 50 mg daily x 4 days, finished yesterday, no appreciable response to amitriptyline 10 mg daily, benzonatate, guafenisin. Has the Symbicort but not using it. Using albuterol which does give her some relief. States the cough is in fact a little better, but a little better only. It's so loud it bothers her family and neighbors, and she worries that she's going to die from it.       Current Outpatient Medications:     amitriptyline (ELAVIL) 25 MG tablet, Take 1 tablet by mouth nightly, Disp: 30 tablet, Rfl: 0    budesonide-formoterol (SYMBICORT) 160-4.5 MCG/ACT inhaler, Inhale 2 puffs into the lungs in the morning and 2 puffs before bedtime. Use 1 puff prn during the day. Rinse mouth after use.., Disp: 1 each, Rfl: 1    venlafaxine (EFFEXOR-XR) 150 MG 24 hr capsule, Take 1 capsule by mouth in the morning., Disp: 30 capsule, Rfl: 2    TruForm Stockings 10-5mmHg (COMPRESSION STOCKINGS), B/l compression stocking, Disp: 2 each, Rfl: 0    clonazePAM (KLONOPIN) 1 MG tablet, 1 tablet two times daily, PRN, Disp: 60 tablet, Rfl: 1    atenolol (TENORMIN) 100 MG tablet, Take 1 tablet by mouth nightly, Disp: 90 tablet, Rfl: 1    meclizine (ANTIVERT) 25 MG TABS, Take 1 tablet by mouth every 8 (eight) hours as needed, Disp: 90 tablet, Rfl: 0    chlorthalidone (HYGROTEN) 25 MG tablet, Take 1 tablet by mouth in the morning., Disp: 90 tablet, Rfl: 0    amlodipine-valsartan (EXFORGE) 5-320 MG per tablet, Take 1 tablet by mouth in the morning., Disp: 30 tablet, Rfl: 11    levothyroxine (SYNTHROID) 50 MCG tablet, Take 1 tablet by mouth every morning before breakfast, Disp: 90 tablet, Rfl: 3    diclofenac (VOLTAREN) 1 % GEL Gel, Apply 2 g topically 4 (four)  times daily as needed, Disp: 300 g, Rfl: 3    calcium citrate-citamin D (CALCIUM CITRATE +) 315-5 MG-MCG tablet, Take 1 tablet by mouth in the morning and 1 tablet before bedtime., Disp: 180 tablet, Rfl: 3    latanoprost (XALATAN) 0.005 % ophthalmic solution, Place 1 drop into both eyes nightly, Disp: 2.5 mL, Rfl: 12    Elastic Bandages & Supports (RA WRIST BRACE ADJ RIGHT L/XL) MISC, Use as able for carpal tunnel syndrome, Disp: 1 each, Rfl: 0    cholecalciferol (VITAMIN D3) 2000 UNIT tablet, Take 1 tablet by mouth in the morning and 1 tablet before bedtime., Disp: 180 tablet, Rfl: 3    acetaminophen (TYLENOL) 500 MG tablet, Take 1 tablet by mouth every 4 (four) hours as needed for Pain, Disp: 60 tablet, Rfl: 3    Review of Patient's Allergies indicates:   Pollen extract          Cough    Past Medical History:  09/11/2018: Chronic kidney disease, stage III (moderate) (HCC)  08/14/2019: COVID-19      Comment:  Risk category:   high  Date of symptom onset: 08/12/19                 Tested? Positive  Risk factors: CKD, BMI > 30, HTN  Clinical Course First date of symptoms:  08/12/19                 08/15/19: (DOI 3): Pt states she is feeling well. Slight                ST and sinus congestion. No cough, fever, SOB, DOE or                edema. F/U in 3 days.  08/18/2019: DOI 7- Feeling better                over all, stable vitals, CM f/u in 2 days. 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)  02/20/2021: Osteopenia  11/29/2017: Panic disorder  No date: Wears eyeglasses    Patient Active Problem List:     Essential hypertension     Labyrinthitis     Panic disorder     Hyperlipidemia     Chronic cough     Mild episode of recurrent major depressive disorder (HCC)     Allergic rhinitis     Chronic pain of both shoulders     Stage 3a chronic kidney disease     Anxiety state      Palpitations     Psychophysiological insomnia     Secondary hyperparathyroidism (HCC)     Osteopenia     Carpal tunnel syndrome of right wrist     Fibromyalgia     Hair loss     Vitamin D deficiency     Obesity (BMI 30-39.9)     Bilateral renal artery stenosis (HCC)     DOE (dyspnea on exertion)     Recurrent epistaxis     Gastroesophageal reflux disease without esophagitis     Other specified hypothyroidism     Combined forms of age-related cataract of right eye     Preoperative examination     Cervical cancer screening     Prediabetes     Healthcare maintenance     Schistosomiasis     Intraoperative floppy iris syndrome (IFIS)     Combined forms of age-related cataract of left eye     Post-viral cough syndrome     Varicose veins of calf     Chronic bilateral low back pain without sciatica      family history is not on file.    Physical Exam    BP (!) 176/92 (Site: LA, Position: Sitting, Cuff Size: Lrg)   Pulse 71   Temp 97.3 F (36.3 C) (Temporal)   Wt 89.8 kg (198 lb)   SpO2 97%   BMI 41.00 kg/m     Constitutional: Alert and oriented, appears well, no acute distress, very anxious, occasional harsh cough, breathing rate normal when not agitated,   Lungs clear to auscultation, no wheezes/rhonchi/rales, good air exchange bilaterally    Assessment and Plan    Sara Snyder was seen today for follow up and cough.    Diagnoses and all orders for this visit:    Chronic cough    Post-viral cough syndrome  -     CBC WITH PLATELET  -     BASIC METABOLIC PANEL  -     amitriptyline (ELAVIL) 25 MG tablet; Take 1 tablet by mouth nightly       Symbicort  BID, cont albuterol prn  Increase amitriptyline to 25 mg at hs  Reassured that she is not in danger, lungs are working well  Urgent pulmonology referral placed by her PCP, await call to schedule    Serena Colonel, CNP

## 2022-06-12 ENCOUNTER — Ambulatory Visit (HOSPITAL_BASED_OUTPATIENT_CLINIC_OR_DEPARTMENT_OTHER): Payer: No Typology Code available for payment source | Admitting: Critical Care Medicine

## 2022-06-17 ENCOUNTER — Encounter (HOSPITAL_BASED_OUTPATIENT_CLINIC_OR_DEPARTMENT_OTHER): Payer: Self-pay | Admitting: Pulmonary

## 2022-06-17 ENCOUNTER — Ambulatory Visit: Payer: No Typology Code available for payment source | Attending: Pulmonary | Admitting: Pulmonary

## 2022-06-17 ENCOUNTER — Other Ambulatory Visit: Payer: Self-pay

## 2022-06-17 VITALS — BP 123/71 | HR 84 | Temp 96.3°F | Wt 195.0 lb

## 2022-06-17 DIAGNOSIS — R053 Chronic cough: Secondary | ICD-10-CM | POA: Insufficient documentation

## 2022-06-17 DIAGNOSIS — K219 Gastro-esophageal reflux disease without esophagitis: Secondary | ICD-10-CM | POA: Diagnosis present

## 2022-06-17 DIAGNOSIS — J040 Acute laryngitis: Secondary | ICD-10-CM | POA: Insufficient documentation

## 2022-06-17 MED ORDER — GUAIFENESIN-CODEINE 100-10 MG/5ML PO SYRP
5.0000 mL | ORAL_SOLUTION | Freq: Every evening | ORAL | 0 refills | Status: AC
Start: 2022-06-17 — End: 2022-07-01

## 2022-06-17 NOTE — Progress Notes (Signed)
Pulmonary clinic visit.    Patient name:  Sara Snyder  Patient sex at birth: female  Chronological age: 66 year old  Name of PCP: Janna Arch, MD  Preferred language: Portuguese [place])      Problem :       Normal CXR and PFT     Normal eos       [phone] Interp          HPI:       Patient comes to clinic accompanied by her daughter.  She reports having a cough for about a month.  Her symptoms began as a upper respiratory illness with a cold, runny nose and then developed a cough which she states like a dog barking.  She also reports being hoarse.  She lost her voice about 1 week ago.  She is being treated with albuterol inhaler, and Symbicort inhaler, they did not help the cough.  She is also treated with a course of prednisone and also given Tessalon Perles.  She stopped using all of the inhalers on Sunday and she noted that her cough started to improve.  She occasionally reports some wheezing at night.    She reports chronic dyspnea on exertion which is going on for about 2 years, some limitation of exercise is also caused by back pain and knee pain that is difficult to know which is the main limiting factor.  She reports some dizziness over the weekend associated with coughing.  She denies any chest pain or neck pain.  Speaking makes her cough.  She reports heartburn which she has had for some time, currently using omeprazole 20 mg once a day which helps.  She takes it in the morning.  She reports a burning sensation in her stomach that goes up into her chest.        PMH:     Had episode about 5 years ago .   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  Unknown if she had any significant exposures.  Has not worked since she came to the [place].      Past Medical History:  09/11/2018: Chronic kidney disease, stage III (moderate) (HCC)  08/14/2019: COVID-19      Comment:  Risk category:   high  Date of symptom onset: 08/12/19                 Tested?  Positive  Risk factors: CKD, BMI > 30, HTN                 Clinical Course First date of symptoms:  08/12/19                 08/15/19: (DOI 3): Pt states she is feeling well. Slight                ST and sinus congestion. No cough, fever, SOB, DOE or                edema. F/U in 3 days.  08/18/2019: DOI 7- Feeling better                over all, stable vitals, CM f/u in 2 days. 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/2018: Mild  episode of recurrent major depressive disorder (HCC)  02/20/2021: Osteopenia  11/29/2017: Panic disorder  No date: Wears eyeglasses    Review of System:     Review of Systems        Family history:  No hx of asthma that she is aware of       No family history on file.          Social history:     Social History     Socioeconomic History    Marital status: Married     Spouse name: Not on file    Number of children: Not on file    Years of education: Not on file    Highest education level: Not on file   Occupational History    Not on file   Tobacco Use    Smoking status: Former     Packs/day: 2.00     Years: 25.00     Additional pack years: 0.00     Total pack years: 50.00     Types: Cigarettes     Quit date: 10/27/1977     Years since quitting: 44.6    Smokeless tobacco: Never   Substance and Sexual Activity    Alcohol use: Not Currently    Drug use: Never    Sexual activity: Yes     Partners: Male   Other Topics Concern    Not on file   Social History Narrative    Originally from [place], [place]    History of childhood trauma    Lives with family   Social Determinants of Health  Financial Resource Strain: Not on file  Food Insecurity: Not on file  Transportation Needs: Not on file  Physical Activity: Not on file  Stress: Not on file  Social Connections: Not on file  Intimate Partner Violence: Not on file  Housing Stability: Not on file      Home situation: Lives with her husband and daughter and family      Occupation and work  exposures: Retired.    Smoking history: Quit smoking in 1979.    Social History    Tobacco Use      Smoking status: Former        Packs/day: 2.00        Years: 25.00        Additional pack years: 0.00        Total pack years: 50.00        Types: Cigarettes        Quit date: 10/27/1977        Years since quitting: 44.6      Smokeless tobacco: Never      Alcohol use: None     Alcohol use   Not Currently       Recreational drug use: None       Drug use:   Unknown     Exercise habits: About 50 feet.    PMH:   Past Medical History:  09/11/2018: Chronic kidney disease, stage III (moderate) (HCC)  08/14/2019: COVID-19      Comment:  Risk category:   high  Date of symptom onset: 08/12/19                 Tested? Positive  Risk factors: CKD, BMI > 30, HTN                 Clinical Course First date of symptoms:  08/12/19  08/15/19: (DOI 3): Pt states she is feeling well. Slight                ST and sinus congestion. No cough, fever, SOB, DOE or                edema. F/U in 3 days.  08/18/2019: DOI 7- Feeling better                over all, stable vitals, CM f/u in 2 days. 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)  02/20/2021: Osteopenia  11/29/2017: Panic disorder  No date: Wears eyeglasses     Allergies-   Review of Patient's Allergies indicates:   Pollen extract          Cough  Current Outpatient Medications   Medication Sig    amitriptyline (ELAVIL) 25 MG tablet Take 1 tablet by mouth nightly    budesonide-formoterol (SYMBICORT) 160-4.5 MCG/ACT inhaler Inhale 2 puffs into the lungs in the morning and 2 puffs before bedtime. Use 1 puff prn during the day. Rinse mouth after use..    venlafaxine (EFFEXOR-XR) 150 MG 24 hr capsule Take 1 capsule by mouth in the morning.    TruForm Stockings 10-29mmHg (COMPRESSION STOCKINGS) B/l compression stocking    clonazePAM (KLONOPIN) 1 MG  tablet 1 tablet two times daily, PRN    atenolol (TENORMIN) 100 MG tablet Take 1 tablet by mouth nightly    meclizine (ANTIVERT) 25 MG TABS Take 1 tablet by mouth every 8 (eight) hours as needed    chlorthalidone (HYGROTEN) 25 MG tablet Take 1 tablet by mouth in the morning.    amlodipine-valsartan (EXFORGE) 5-320 MG per tablet Take 1 tablet by mouth in the morning.    levothyroxine (SYNTHROID) 50 MCG tablet Take 1 tablet by mouth every morning before breakfast    diclofenac (VOLTAREN) 1 % GEL Gel Apply 2 g topically 4 (four) times daily as needed    calcium citrate-citamin D (CALCIUM CITRATE +) 315-5 MG-MCG tablet Take 1 tablet by mouth in the morning and 1 tablet before bedtime.    latanoprost (XALATAN) 0.005 % ophthalmic solution Place 1 drop into both eyes nightly    Elastic Bandages & Supports (RA WRIST BRACE ADJ RIGHT L/XL) MISC Use as able for carpal tunnel syndrome    cholecalciferol (VITAMIN D3) 2000 UNIT tablet Take 1 tablet by mouth in the morning and 1 tablet before bedtime.    acetaminophen (TYLENOL) 500 MG tablet Take 1 tablet by mouth every 4 (four) hours as needed for Pain     No current facility-administered medications for this visit.       Physical exam  BP 123/71 (Site: RA, Position: Sitting, Cuff Size: Lrg)   Pulse 84   Temp 96.3 F (35.7 C) (Temporal)   Wt 88.5 kg (195 lb)   SpO2 96%   BMI 40.36 kg/m   66 year old female    She got very upset during the interview as she related her history.  She calmed down as the clinic visit went on.  HEENT: Sclera: anicteric Neck: Supple; no JVD; trachea midline, no nodes  No neck tenderness.  No masses.  Oropharynx appeared normal, Mallampati 2 airway, dentures in the upper jaw which we took action I inspected her palate which appeared  normal.  No retrognathia.  Chest was clear to auscultation and percussion.  Heart sounds 1 and 2 are normal.  Abdomen was tender in the epigastric area.  No masses palpable.  No peripheral edema, no history of  thromboembolic disease.    Relevant lab data:     Annalicia Renfrew BLOOD CELL COUNT (TH/uL)   Date Value   06/11/2022 10.0     RED BLOOD CELL COUNT (M/uL)   Date Value   06/11/2022 3.77 (L)     HEMOGLOBIN (g/dL)   Date Value   94/85/4627 11.0 (L)     No results found for: "IDEALHCT"  HEMATOCRIT (%)   Date Value   06/11/2022 34.1     No results found for: "HCTDIFF"  MEAN CORPUSCULAR VOL (fl)   Date Value   06/11/2022 90.5     MEAN CORPUSCULAR HGB (pg)   Date Value   06/11/2022 29.2     MEAN CORP HGB CONC (g/dL)   Date Value   03/50/0938 32.3     No results found for: "RDW"  PLATELET COUNT (TH/uL)   Date Value   06/11/2022 342     MEAN PLATELET VOLUME (fL)   Date Value   06/11/2022 10.4     NEUTROPHIL % (%)   Date Value   11/13/2019 52.6     LYMPHOCYTE % (%)   Date Value   11/13/2019 39.3     MONOCYTE % (%)   Date Value   11/13/2019 5.4     EOSINOPHIL % (%)   Date Value   11/13/2019 1.9     BASOPHIL % (%)   Date Value   11/13/2019 0.4       Radiology data:     Normal chest x-ray 05/25/2022 and 06/07/2022  PFT:     Flow volume loops are normal, and unchanged with bronchodilator.  FEV1 is 2.02 L (98%   predicted), FVC is 2.53 L (97% predicted), and FEV1 to FVC ratio is normal at 80%.  Lung volumes are all within normal limits, with the exception of a reduced expiratory   reserve volume.  Diffusion capacity is calculated as being within normal limits (55%   of predicted, and when adjusted for alveolar volume, 114% predicted).  O2 saturation ranges from 96 to 98% with exertion.  Impression: No obstructive or restrictive dysfunction.  Diffusion capacity and   exercise oximetry are within normal limits.  Recommendation: If asthma is a concern, patient may be referred for methacholine   challenge testing.             Vaccinations status :     Immunization History   Administered Date(s) Administered    Covid-19 Vaccine [employer] Cap) 02/06/2021    Covid-19 Vaccine [employer] - [employer] Cap) 09/06/2019, 09/27/2019, 06/13/2020    Covid-19  Vaccine [employer] Bivalent 68yo>) 07/14/2021    Influenza Vaccine High Dose 0.35ml 65 And Older 07/14/2021, 06/11/2022    Influenza Virus Quad Presv Free Vacc 6 Mo and Older, IM 05/23/2018, 04/10/2019, 06/13/2020    PCV13 04/10/2019    PNEUMOCOCCAL POLYSACCHARIDE VACCINE v23 02/06/2021    Tdap 11/17/2017     Assessment:      1. Chronic cough  Her cough is improved since approximately 3 days ago, she stopped using all the inhalers.  Cough appears to be contemporaneous with hoarseness and I suspect cough may be coming from her larynx.  She did have a prior upper respiratory illness which may have precipitated some laryngeal inflammation.  In addition she has symptoms of reflux she is currently on omeprazole 20 mg daily which have asked her to increase to 20 mg twice a day.  Chest x-ray on 2 occasions has been normal.  She does not appear to have any sinus disease.  I will refer her to ENT to get bronchoscopy done to make sure there is no vocal cord pathology, she is not an active smoker but was a smoker in the distant past.  I gave her a prescription for Robitussin with codeine for total of 2 weeks with no refills to help her through the next couple of weeks hopefully once the omeprazole 20 mg twice a day becomes effective her cough will dissipate.  I will ask my nurse to call her in about 1 week to see if things are better.  I put in a referral to see her again in 3 months.    - REFERRAL TO ENT (INT)    2. Laryngitis from reflux of stomach acid  As outlined above, will augment the dose of PPI.      Plan:          I spent a total of 60  minutes on this visit on the date of service (total time includes all activities performed on the date of service)    Visit was prolonged due to the patient becoming quite upset during the visit requiring reassurance, also I had to use the interpreter.        This note was generated with speech recognition software.  Please recognize that as a result there may be some unintended errors in  transcription. Thank you for your understanding.       Estrellita LudwigAlexander Emran Molzahn, MD  06/17/2022

## 2022-06-17 NOTE — Progress Notes (Signed)
Patient feels physically safe at home.

## 2022-06-17 NOTE — Patient Instructions (Addendum)
Increase Prilosec to twice a day for 4 weeks     Robitussin with codeine 5 ml at night for cough     I will refer you to ENT       You may need to see GI in a few weeks     See me back in about 3 months     We will call you in one week

## 2022-06-19 ENCOUNTER — Ambulatory Visit (HOSPITAL_BASED_OUTPATIENT_CLINIC_OR_DEPARTMENT_OTHER): Payer: No Typology Code available for payment source | Admitting: "Psychiatric/Mental Health

## 2022-06-25 ENCOUNTER — Telehealth (HOSPITAL_BASED_OUTPATIENT_CLINIC_OR_DEPARTMENT_OTHER): Payer: Self-pay | Admitting: General Practice

## 2022-06-25 NOTE — Telephone Encounter (Signed)
Called patient via telephone interpreter [place]). Per Dr. Orest Dikes request, asked patient how she was feeling. Patient reports the cough has improved but the hoarseness is "slightly better but still there". Patient has been taking all medications as ordered. Dr. Dema Severin notified.     Burna Sis, MD  [facility]  Please call her in 1 week to check if hoarseness and cough are better thanks

## 2022-06-30 ENCOUNTER — Other Ambulatory Visit (HOSPITAL_BASED_OUTPATIENT_CLINIC_OR_DEPARTMENT_OTHER): Payer: Self-pay | Admitting: "Psychiatric/Mental Health

## 2022-06-30 MED ORDER — CLONAZEPAM 1 MG PO TABS
ORAL_TABLET | ORAL | 2 refills | Status: DC
Start: 2022-06-30 — End: 2022-12-02

## 2022-06-30 NOTE — Telephone Encounter (Signed)
PER Pharmacy, Sara Snyder is a 67 year old female has requested a refill of       CLONAZEPAM      Last prescribed -     start date: 05/12/2022     end date: 07/10/2022    Last Office Visit: 06/11/2022  With: Jonathon Resides, CNP      Last Physical Exam: 06/13/2020  There are no preventive care reminders to display for this patient.    Other Med Adult:  Most Recent BP Reading(s)  06/17/22 : 123/71        Cholesterol (mg/dL)   Date Value   07/14/2021 215     LOW DENSITY LIPOPROTEIN DIRECT (mg/dL)   Date Value   07/14/2021 153     HIGH DENSITY LIPOPROTEIN (mg/dL)   Date Value   07/14/2021 44     TRIGLYCERIDES (mg/dL)   Date Value   06/13/2020 148         THYROID SCREEN TSH REFLEX FT4 (uIU/mL)   Date Value   05/01/2022 3.320         TSH (THYROID STIM HORMONE) (uIU/mL)   Date Value   01/14/2021 7.090 (H)       HEMOGLOBIN A1C (%)   Date Value   05/25/2022 6.3 (H)       No results found for: "POCA1C"      No results found for: "INR"    SODIUM (mmol/L)   Date Value   06/11/2022 142       POTASSIUM (mmol/L)   Date Value   06/11/2022 4.1           CREATININE (mg/dL)   Date Value   06/11/2022 1.1       Documented patient preferred pharmacies:    [facility], [place] - [place].  Phone: [phone] Fax: [phone]

## 2022-07-08 ENCOUNTER — Ambulatory Visit: Payer: No Typology Code available for payment source | Attending: Orthopaedic Surgery | Admitting: Orthopaedic Surgery

## 2022-07-08 ENCOUNTER — Other Ambulatory Visit: Payer: Self-pay

## 2022-07-08 ENCOUNTER — Encounter (HOSPITAL_BASED_OUTPATIENT_CLINIC_OR_DEPARTMENT_OTHER): Payer: Self-pay

## 2022-07-08 DIAGNOSIS — R52 Pain, unspecified: Secondary | ICD-10-CM | POA: Diagnosis present

## 2022-07-08 DIAGNOSIS — M797 Fibromyalgia: Secondary | ICD-10-CM | POA: Insufficient documentation

## 2022-07-08 NOTE — Progress Notes (Signed)
Date of service: 07/08/2022    The patient returns for follow up of diffuse body pain.  I saw her a year ago for similar complaints.  She has noted chronic pain throughout her body and her upper arms, her back her hips and her lower legs.  She reports her symptoms are no different from when I saw her last year.  She notes pain in her upper arms primarily in the upper aspect but not distinctly around the shoulder.  She never went to physical therapy.  She was seen in rheumatology but was not thought to have a systemic inflammatory process and this is fibromyalgia.Marland Kitchen  She was prescribed Effexor for which her dosage was increased recently.  She was recently seen in her primary care office and referred back to orthopedics for consideration for  injections.      Physical examination:   General - the patient is alert and oriented x3, well appearing, in no acute distress.  Normal affect and mood.  Neck: Full motion.  Mild pain with lateral rotation and flexion.  Spurling sign negative.  Bilateral shoulders, elbows demonstrates full active range of motion.  There is nonspecific tenderness over the biceps and upper arm musculature.  No tenderness over the [facility] joint.  Provocative testing of the shoulder is unremarkable.  Strength 5/5.  Sensation intact C5-T1.  Radial pulses 2+.  Hoffmann negative.       Assessement: Bilateral upper arm pain, diffuse body pain, fibromyalgia   .    Plan: I reviewed the findings with the patient and her daughter via telephone interpreter.  Her symptoms and examination are unchanged from my evaluation a year ago.  I had a prolonged discussion again that I do not appreciate a structural etiology with regard to her shoulder and arm pain, for which there would be consideration for cortisone or certainly any more invasive intervention.  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  She was very emotional and was crying during the encounter, there is certainly  a psychologic component to her pain syndrome.  It seems she was post to have a televisit with her therapist recently but no showed for that.  I encouraged her to reschedule.  Over this time she has never done any formal physical therapy for which I encouraged her again to schedule.  I recommended following up with her primary care provider for further medication and other management recommendations.  She will follow-up with me as needed.  All questions were answered.    I spent a total of 25 minutes on this visit on the date of service (total time includes all activities performed on the date of service)      This note was prepared using voice recognition software.  Please disregard any transcription errors.

## 2022-07-13 ENCOUNTER — Encounter (HOSPITAL_BASED_OUTPATIENT_CLINIC_OR_DEPARTMENT_OTHER): Payer: Self-pay | Admitting: "Psychiatric/Mental Health

## 2022-07-13 ENCOUNTER — Encounter (HOSPITAL_BASED_OUTPATIENT_CLINIC_OR_DEPARTMENT_OTHER): Payer: Self-pay

## 2022-07-13 DIAGNOSIS — K219 Gastro-esophageal reflux disease without esophagitis: Secondary | ICD-10-CM

## 2022-07-13 NOTE — Telephone Encounter (Signed)
PER Patient (self), Sara Snyder is a 67 year old female has requested a refill of Omeprazole 20 mg.      Last Office Visit: 06/11/22 with Gerlean Ren  Last Physical Exam: 06/13/20    There are no preventive care reminders to display for this patient.    Other Med Adult:  Most Recent BP Reading(s)  06/17/22 : 123/71        Cholesterol (mg/dL)   Date Value   07/14/2021 215     LOW DENSITY LIPOPROTEIN DIRECT (mg/dL)   Date Value   07/14/2021 153     HIGH DENSITY LIPOPROTEIN (mg/dL)   Date Value   07/14/2021 44     TRIGLYCERIDES (mg/dL)   Date Value   06/13/2020 148         THYROID SCREEN TSH REFLEX FT4 (uIU/mL)   Date Value   05/01/2022 3.320         TSH (THYROID STIM HORMONE) (uIU/mL)   Date Value   01/14/2021 7.090 (H)       HEMOGLOBIN A1C (%)   Date Value   05/25/2022 6.3 (H)       No results found for: "POCA1C"      No results found for: "INR"    SODIUM (mmol/L)   Date Value   06/11/2022 142       POTASSIUM (mmol/L)   Date Value   06/11/2022 4.1           CREATININE (mg/dL)   Date Value   06/11/2022 1.1       Documented patient preferred pharmacies:    [facility], [place] - [place].  Phone: [phone] Fax: [phone]

## 2022-07-13 NOTE — Telephone Encounter (Signed)
From: Vickie Epley  To: Office of Darl Pikes, MD  Sent: 07/13/2022 3:20 PM EST  Subject: Medication Renewal Request    Refills have been requested for the following medications:   Other - My mother takes 20mg  omeprazole daily, and she needs a refill. Could you send it to the pharmacy at the [facility] clinic please?    Preferred pharmacy: [facility], [place].    This message is being sent by Wallie Char on behalf of Sara Snyder

## 2022-07-14 ENCOUNTER — Other Ambulatory Visit: Payer: Self-pay

## 2022-07-14 ENCOUNTER — Ambulatory Visit: Payer: No Typology Code available for payment source | Attending: Ophthalmology | Admitting: Ophthalmology

## 2022-07-14 DIAGNOSIS — H40003 Preglaucoma, unspecified, bilateral: Secondary | ICD-10-CM | POA: Diagnosis not present

## 2022-07-14 MED ORDER — OMEPRAZOLE 20 MG PO CPDR
20.0000 mg | DELAYED_RELEASE_CAPSULE | Freq: Every day | ORAL | 3 refills | Status: DC
Start: 2022-07-14 — End: 2023-02-25

## 2022-07-14 NOTE — Progress Notes (Signed)
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      OCT Disc and optos done          C/O Itching OU.  Tearing OU.  No pain.  No light flashes or floaters.          Ocular med  None

## 2022-07-14 NOTE — Progress Notes (Signed)
F/u glaucoma suspect to evaluate IOP and risk of glaucoma         Impression,    1. Pseudophakia OU-doing well  OD 02/03/22  OS 03/25/22    2. Glaucoma suspect- no taking any gtts.    IOP was 12 OD and 12 OS (No glaucoma medications)     Disc OCT today (07/14/22) had good signal strength and revealed an average retinal NFL thickness of 78 microns with a C/D of 0.71 and 68 microns OS with a C/D of 0.74     HVF revealed infero nasal defect OD and temporal defect OS     Plan-re check IOP 4 months      3. Refractive error-Copy of recent eye glass rx. Given to patient    HVF prior to next visit

## 2022-07-27 ENCOUNTER — Ambulatory Visit (HOSPITAL_BASED_OUTPATIENT_CLINIC_OR_DEPARTMENT_OTHER): Payer: No Typology Code available for payment source

## 2022-07-27 DIAGNOSIS — N2581 Secondary hyperparathyroidism of renal origin: Secondary | ICD-10-CM | POA: Insufficient documentation

## 2022-07-27 DIAGNOSIS — F330 Major depressive disorder, recurrent, mild: Secondary | ICD-10-CM | POA: Diagnosis present

## 2022-07-27 DIAGNOSIS — M8580 Other specified disorders of bone density and structure, unspecified site: Secondary | ICD-10-CM | POA: Diagnosis present

## 2022-07-27 DIAGNOSIS — M797 Fibromyalgia: Secondary | ICD-10-CM | POA: Diagnosis not present

## 2022-07-27 DIAGNOSIS — R7303 Prediabetes: Secondary | ICD-10-CM | POA: Diagnosis not present

## 2022-07-27 DIAGNOSIS — E038 Other specified hypothyroidism: Secondary | ICD-10-CM | POA: Diagnosis not present

## 2022-07-27 DIAGNOSIS — R053 Chronic cough: Secondary | ICD-10-CM | POA: Insufficient documentation

## 2022-07-27 DIAGNOSIS — J309 Allergic rhinitis, unspecified: Secondary | ICD-10-CM | POA: Insufficient documentation

## 2022-07-27 MED ORDER — FLUTICASONE PROPIONATE 50 MCG/ACT NA SUSP
1.00 | Freq: Every day | NASAL | 1 refills | Status: AC
Start: 2022-07-27 — End: 2022-10-25

## 2022-07-27 MED ORDER — GABAPENTIN 100 MG PO CAPS
100.0000 mg | ORAL_CAPSULE | Freq: Three times a day (TID) | ORAL | 2 refills | Status: DC
Start: 2022-07-27 — End: 2023-09-14

## 2022-07-27 MED ORDER — LEVOTHYROXINE SODIUM 50 MCG PO TABS
50.0000 ug | ORAL_TABLET | Freq: Every morning | ORAL | 3 refills | Status: DC
Start: 2022-07-27 — End: 2022-12-03

## 2022-07-27 MED ORDER — LORATADINE 10 MG PO TABS
10.0000 mg | ORAL_TABLET | Freq: Every day | ORAL | 2 refills | Status: DC
Start: 2022-07-27 — End: 2023-10-03

## 2022-07-27 NOTE — Progress Notes (Signed)
Chief Complaint:   Sara Snyder is a 67 year old year-old female w/  w/ HTN, HLD, prediabetes, palpitations, obesity, secondary hyperparathyroidism, labrynthitis, allergic rhinitis, CKDIII, panic d/o, MDD, anxiety, osteopenia, arthralgias, chronic cough, insomnia, recurrent epistaxis, subclinical hypothyroid, vit d deficiency, b/l renal artery stenosis, and GERD who presents for follow up.     Last seen by me 05/25/22->  -HTN: bp well controlled on chlorthalidone 25, amlodipine/valsartain 5/320, atenolol 152m daily  -Secondary hyperpara: due for repeat ca/PTH  -Obestiy: taking trulicity but unable to discuss  -Prediabetes: due for repeat A1c given borderline when last checked  -Vaccines: deferred given recovering from recent uri   -MDD: feels venlafaxine not working as well now, suspected fibromyalgia; increase venlafaxine to 2228mdaily   -Chronic pain both shoulders: ortho, PT  -Fibromyalgia: increase venlafaxine, consider lyrica in the future  -Chronic b/l LBP w/out sciatica: PT, weight loss, increased exercise; okay for low-dose muscle relaxer in the setting of acute flares; Tylenol diclofenac gel  -Postviral cough syndrome: Recent URI with intermittent chronic cough in the past now exacerbated; no changes in baseline dyspnea on exertion thought to be related to deconditioning; taking omeprazole for GERD and says symptoms are well-controlled, does have rhinorrhea and postnasal drip; satting well on room air but diffusely wheezy; chest x-ray, loratadine, Flonase, albuterol, 2-week follow-up visit  -Varicose veins: Rx for compression stockings  -Itching: Instructed to reach out if not improving with loratadine    Interval hx->   -12/5 TE: Chest x-ray normal, PTH/calcium stable, A1c 6.3  - MyChart message: Insurance will not cover to 25 mg venlafaxine, need to take 150 mg and 75 mg pills together; stay on the lower dose of venlafaxine while taking amitriptyline  -12/13 office visit with SaJonathon ResidesP:  Symbicort and amitriptyline  -12/17 ED visit for cough: Chest x-ray normal, continue with plan  -12/20 MyChart message: Cough still very bad; urgent referral to pulm send expectorant to pharmacy  -12/21 office visit with NP SaJonathon ResidesIncrease amitriptyline  -12/27 office visit with pulm: Suspect cough from the larynx; increase omeprazole to 20 mg twice daily, ENT referral for bronchoscopy to rule out vocal cord pathology  -12/29 missed psych appointment  -1/4 TE: Pulm calling to see if symptoms better, symptoms slightly better  -1/17 office visit with Ortho: No structural etiology to explain symptoms, mild AC joint arthritis radiographically but no symptoms from there; formal PT  -1/17 MyChart message: Very upset about Ortho appointment; not taking amitriptyline    Today->   Problem List Items Addressed This Visit          Endocrine and Metabolic    Secondary hyperparathyroidism ([facility]ray    PTH mildly elevated and calcium upper limit of normal when last checked 05/2022.  Currently prescribed vitamin D and calcium supplements for osteopenia, though she has not been taking.  Will continue to monitor PTH and calcium, including next time getting labs if she has been taking calcium and vitamin D consistently.    Plan:  -Repeat PTH and calcium next time getting labs if she has been taking calcium and vitamin D supplements consistently         Other specified hypothyroidism     Today reports she has not been taking levothyroxine.  Not sure how long ago she stopped taking it.  Last picked up 90-day supply in October.  Having significant mood symptoms and fatigue.  Unclear how much thyroid is contributing.  Instructed her to restart levothyroxine and will check  TSH at next in-person visit.    Plan:  -Restart levothyroxine 50 mcg  -TSH next visit         Relevant Medications    levothyroxine (SYNTHROID) 50 MCG tablet    Prediabetes     A1c 6.2% when checked 12/5.  Will plan to repeat in 1 year or sooner if symptoms arise  concerning for progression to diabetes.            ENT    Allergic rhinitis     Reporting 1 week of stuffy nose in the morning and dry mouth.  Currently taking loratadine.  Will add Flonase.         Relevant Medications    fluticasone (FLONASE) 50 MCG/ACT nasal spray    loratadine (CLARITIN) 10 MG tablet       Mental Health    Mild episode of recurrent major depressive disorder (HCC)     Mood continues to be poorly controlled and they feel like venlafaxine is no longer working.  Suspected to have fibromyalgia per rheumatology evaluation for all over body pains and concurrent depression.  Last visit had plan to increase venlafaxine to see if symptoms improved, however this never happened given initiation of TCA for worsening of chronic cough.  Cough is back to baseline and t she is not taking TCA at this time.  Given they feel venlafaxine is not working well, and possible evidence for duloxetine having more benefit in fibromyalgia, discussed with Deionna and her daughter today the possibility of tapering off venlafaxine and starting duloxetine versus trial of increasing venlafaxine.  Ultimately they decided to taper venlafaxine and start duloxetine.    Has not been to psychiatry recently given day do not like the therapist or psychiatrist she has been seeing.  Encouraged them to call psychiatry department and asked to be transition to new providers.  No SI/HI today.      Plan:  -Call psychiatry to request new provider  -Taper venlafaxine over the next 4 weeks with the following schedule:  Week 1: alternate every other day between 150 mg and 112.5 mg (60m capsule + 37.5 mg capsule)  Week 2: alternate every other day between 112.5 mg (763mcapsule + 37.5 mg capsule) and 75 mg  Week 3: alternate every other day between 754mnd 37.5 mg  Week 4: alternate every other day between 37.5 mg and nothing   After 4 week taper stop taking and we will start the duloxetine.            Musculoskeletal and Injuries    Osteopenia      Not currently taking vitamin D or calcium supplements.  Educated on importance of these supplements for bone health.         Fibromyalgia - Primary     HPI:  -Pain in multiple locations (back, hips, wrists, elbows, knees), difficulty walking and sleeping d/t pain, rheum labs positive for elevated CRP. Rheum evaluation without clear rheumatologic condition, suspect fibromyalgia, though requested to be sent back to rheumatology if any joint swelling associated with pain given positive CRP.  -Has been on venlafaxine for many years, feels it is no longer working  -Unable to increase exercise due to pain  -Not actively following with psychiatry due to feeling like she does not connect with providers    Assessment:  Clinical presentation consistent with fibromyalgia.  Currently with mood and pain symptoms uncontrolled.  Provided extensive education today regarding what fibromyalgia is and the need for multiple modalities  of treatment.  Discussed transition from venlafaxine to duloxetine.  Additionally will try gabapentin low-dose for pain control.  Referred to PT for exercise.  Provided number to psychiatry for transitioning to new provider.    Plan:  -Start gabapentin 100 mg 3 times daily; educated about possible sedation  -Transition from venlafaxine to duloxetine (see plan for depression)  -Physical therapy referral for exercise  -Call psychiatry to establish with new provider           Relevant Medications    gabapentin (NEURONTIN) 100 MG capsule    venlafaxine (EFFEXOR-XR) 37.5 MG 24 hr capsule    venlafaxine (EFFEXOR-XR) 75 MG 24 hr capsule    Other Relevant Orders    REFERRAL TO PHYSICAL THERAPY (INT) (Completed)       Pulmonary and Pneumonias    Chronic cough     Recent exacerbation of chronic cough in the setting of URI. Not discussed today other than confirming now back to baseline.           PHYSICAL EXAMINATION:  No physical exam today due to nature of televisit.     Follow-up: 1 month for fibromyalgia (assess  effect of gabapentin, start duloxetine if finished with venlafaxine taper)     Darl Pikes, MD, MPH  Internal Medicine [phone]

## 2022-07-28 ENCOUNTER — Encounter (HOSPITAL_BASED_OUTPATIENT_CLINIC_OR_DEPARTMENT_OTHER): Payer: Self-pay

## 2022-07-28 MED ORDER — VENLAFAXINE HCL ER 75 MG PO CP24
ORAL_CAPSULE | ORAL | 0 refills | Status: DC
Start: 2022-07-28 — End: 2022-10-09

## 2022-07-28 MED ORDER — VENLAFAXINE HCL ER 37.5 MG PO CP24
ORAL_CAPSULE | ORAL | 0 refills | Status: DC
Start: 2022-07-28 — End: 2022-10-29

## 2022-07-31 ENCOUNTER — Ambulatory Visit (HOSPITAL_BASED_OUTPATIENT_CLINIC_OR_DEPARTMENT_OTHER): Payer: No Typology Code available for payment source

## 2022-07-31 NOTE — Assessment & Plan Note (Signed)
Reporting 1 week of stuffy nose in the morning and dry mouth.  Currently taking loratadine.  Will add Flonase.

## 2022-07-31 NOTE — Assessment & Plan Note (Signed)
HPI:  -Pain in multiple locations (back, hips, wrists, elbows, knees), difficulty walking and sleeping d/t pain, rheum labs positive for elevated CRP. Rheum evaluation without clear rheumatologic condition, suspect fibromyalgia, though requested to be sent back to rheumatology if any joint swelling associated with pain given positive CRP.  -Has been on venlafaxine for many years, feels it is no longer working  -Unable to increase exercise due to pain  -Not actively following with psychiatry due to feeling like she does not connect with providers    Assessment:  Clinical presentation consistent with fibromyalgia.  Currently with mood and pain symptoms uncontrolled.  Provided extensive education today regarding what fibromyalgia is and the need for multiple modalities of treatment.  Discussed transition from venlafaxine to duloxetine.  Additionally will try gabapentin low-dose for pain control.  Referred to PT for exercise.  Provided number to psychiatry for transitioning to new provider.    Plan:  -Start gabapentin 100 mg 3 times daily; educated about possible sedation  -Transition from venlafaxine to duloxetine (see plan for depression)  -Physical therapy referral for exercise  -Call psychiatry to establish with new provider

## 2022-07-31 NOTE — Assessment & Plan Note (Signed)
Mood continues to be poorly controlled and they feel like venlafaxine is no longer working.  Suspected to have fibromyalgia per rheumatology evaluation for all over body pains and concurrent depression.  Last visit had plan to increase venlafaxine to see if symptoms improved, however this never happened given initiation of TCA for worsening of chronic cough.  Cough is back to baseline and t she is not taking TCA at this time.  Given they feel venlafaxine is not working well, and possible evidence for duloxetine having more benefit in fibromyalgia, discussed with Sara Snyder and her daughter today the possibility of tapering off venlafaxine and starting duloxetine versus trial of increasing venlafaxine.  Ultimately they decided to taper venlafaxine and start duloxetine.    Has not been to psychiatry recently given day do not like the therapist or psychiatrist she has been seeing.  Encouraged them to call psychiatry department and asked to be transition to new providers.  No SI/HI today.      Plan:  -Call psychiatry to request new provider  -Taper venlafaxine over the next 4 weeks with the following schedule:  Week 1: alternate every other day between 150 mg and 112.5 mg (32m capsule + 37.5 mg capsule)  Week 2: alternate every other day between 112.5 mg (711mcapsule + 37.5 mg capsule) and 75 mg  Week 3: alternate every other day between 7574mnd 37.5 mg  Week 4: alternate every other day between 37.5 mg and nothing   After 4 week taper stop taking and we will start the duloxetine.

## 2022-07-31 NOTE — Assessment & Plan Note (Signed)
Recent exacerbation of chronic cough in the setting of URI. Not discussed today other than confirming now back to baseline.

## 2022-07-31 NOTE — Assessment & Plan Note (Signed)
Not currently taking vitamin D or calcium supplements.  Educated on importance of these supplements for bone health.

## 2022-07-31 NOTE — Assessment & Plan Note (Signed)
A1c 6.2% when checked 12/5.  Will plan to repeat in 1 year or sooner if symptoms arise concerning for progression to diabetes.

## 2022-07-31 NOTE — Assessment & Plan Note (Signed)
PTH mildly elevated and calcium upper limit of normal when last checked 05/2022.  Currently prescribed vitamin D and calcium supplements for osteopenia, though she has not been taking.  Will continue to monitor PTH and calcium, including next time getting labs if she has been taking calcium and vitamin D consistently.    Plan:  -Repeat PTH and calcium next time getting labs if she has been taking calcium and vitamin D supplements consistently

## 2022-07-31 NOTE — Assessment & Plan Note (Signed)
Today reports she has not been taking levothyroxine.  Not sure how long ago she stopped taking it.  Last picked up 90-day supply in October.  Having significant mood symptoms and fatigue.  Unclear how much thyroid is contributing.  Instructed her to restart levothyroxine and will check TSH at next in-person visit.    Plan:  -Restart levothyroxine 50 mcg  -TSH next visit

## 2022-08-05 ENCOUNTER — Ambulatory Visit (HOSPITAL_BASED_OUTPATIENT_CLINIC_OR_DEPARTMENT_OTHER): Payer: No Typology Code available for payment source

## 2022-08-11 ENCOUNTER — Other Ambulatory Visit (HOSPITAL_BASED_OUTPATIENT_CLINIC_OR_DEPARTMENT_OTHER): Payer: Self-pay | Admitting: Internal Medicine

## 2022-08-11 DIAGNOSIS — I10 Essential (primary) hypertension: Secondary | ICD-10-CM

## 2022-08-12 NOTE — Telephone Encounter (Signed)
PER Pharmacy, Sara Snyder is a 67 year old female has requested a refill of chlorthalidone.    This refill request failed protocol because     Diuretics Protocol Failed02/20/2024 02:58 PM   Protocol Details  eGFR > 55 in past year          Out of range lab, per protocol unable to send rx automatically. Provider please review if refills are appropriate.      Last Office Visit: 07/27/22 with PCP  Last Physical Exam: 06/13/20    There are no preventive care reminders to display for this patient.    Other Med Adult:  Most Recent BP Reading(s)  06/17/22 : 123/71        Cholesterol (mg/dL)   Date Value   07/14/2021 215     LOW DENSITY LIPOPROTEIN DIRECT (mg/dL)   Date Value   07/14/2021 153     HIGH DENSITY LIPOPROTEIN (mg/dL)   Date Value   07/14/2021 44     TRIGLYCERIDES (mg/dL)   Date Value   06/13/2020 148         THYROID SCREEN TSH REFLEX FT4 (uIU/mL)   Date Value   05/01/2022 3.320         TSH (THYROID STIM HORMONE) (uIU/mL)   Date Value   01/14/2021 7.090 (H)       HEMOGLOBIN A1C (%)   Date Value   05/25/2022 6.3 (H)       No results found for: "POCA1C"      No results found for: "INR"    SODIUM (mmol/L)   Date Value   06/11/2022 142       POTASSIUM (mmol/L)   Date Value   06/11/2022 4.1           CREATININE (mg/dL)   Date Value   06/11/2022 1.1       Documented patient preferred pharmacies:    [facility], [place] - [place].  Phone: [phone] Fax: [phone]

## 2022-09-01 ENCOUNTER — Ambulatory Visit (HOSPITAL_BASED_OUTPATIENT_CLINIC_OR_DEPARTMENT_OTHER): Payer: No Typology Code available for payment source | Admitting: Otolaryngology

## 2022-09-01 ENCOUNTER — Encounter (HOSPITAL_BASED_OUTPATIENT_CLINIC_OR_DEPARTMENT_OTHER): Payer: Self-pay

## 2022-09-08 ENCOUNTER — Encounter (HOSPITAL_BASED_OUTPATIENT_CLINIC_OR_DEPARTMENT_OTHER): Payer: Self-pay

## 2022-09-08 DIAGNOSIS — M797 Fibromyalgia: Secondary | ICD-10-CM

## 2022-09-09 NOTE — Telephone Encounter (Signed)
PER Pharmacy, Kileen Getchell is a 67 year old female has requested a refill of venlafaxine 150 mg caps.      Last Office Visit: [id number] with Darl Pikes, MD  Last Physical Exam: 06/13/2020      Other Med Adult:  Most Recent BP Reading(s)  06/17/22 : 123/71        Cholesterol (mg/dL)   Date Value   07/14/2021 215     LOW DENSITY LIPOPROTEIN DIRECT (mg/dL)   Date Value   07/14/2021 153     HIGH DENSITY LIPOPROTEIN (mg/dL)   Date Value   07/14/2021 44     TRIGLYCERIDES (mg/dL)   Date Value   06/13/2020 148         THYROID SCREEN TSH REFLEX FT4 (uIU/mL)   Date Value   05/01/2022 3.320         TSH (THYROID STIM HORMONE) (uIU/mL)   Date Value   01/14/2021 7.090 (H)       HEMOGLOBIN A1C (%)   Date Value   05/25/2022 6.3 (H)       No results found for: "POCA1C"      No results found for: "INR"    SODIUM (mmol/L)   Date Value   06/11/2022 142       POTASSIUM (mmol/L)   Date Value   06/11/2022 4.1           CREATININE (mg/dL)   Date Value   06/11/2022 1.1       Documented patient preferred pharmacies:    [facility], [place] - [place].  Phone: [phone] Fax: [phone]

## 2022-09-09 NOTE — Telephone Encounter (Signed)
Covering for PCP    Nursing, please see Dr. Nolen Mu last note  Before refilling this she should meet w/ provider to see if she is still planning on titrating off  Also remind her to call for new Psychiatry provider     -Call psychiatry to request new provider    -Taper venlafaxine over the next 4 weeks with the following schedule:  Week 1: alternate every other day between 150 mg and 112.5 mg (75mg  capsule + 37.5 mg capsule)  Week 2: alternate every other day between 112.5 mg (75mg  capsule + 37.5 mg capsule) and 75 mg  Week 3: alternate every other day between 75mg  and 37.5 mg  Week 4: alternate every other day between 37.5 mg and nothing   After 4 week taper stop taking and we will start the duloxetine.    Book televisit with [place] to review titration from Venlafaxine to Duloxetine    Joeanne Robicheaux R. Frances Nickels, MD  09/09/22 2:26 PM

## 2022-09-10 NOTE — Telephone Encounter (Signed)
Spoke with dtr    Has not titrated it yet but does want to  Very confused how to do it    Scheduled tele appt for tomorrow as advised  Dtr would like refill of 150 anyway since pt has ran out and dtr had to give some of hers( which is also 150).    Routing to provider    Desiree Lucy, RN, 09/10/2022

## 2022-09-11 ENCOUNTER — Ambulatory Visit: Payer: No Typology Code available for payment source | Attending: Internal Medicine

## 2022-09-11 DIAGNOSIS — F330 Major depressive disorder, recurrent, mild: Secondary | ICD-10-CM | POA: Diagnosis not present

## 2022-09-11 DIAGNOSIS — M797 Fibromyalgia: Secondary | ICD-10-CM | POA: Diagnosis not present

## 2022-09-11 MED ORDER — VENLAFAXINE HCL ER 150 MG PO CP24
ORAL_CAPSULE | ORAL | 0 refills | Status: DC
Start: 2022-09-11 — End: 2022-10-09

## 2022-09-11 NOTE — Progress Notes (Unsigned)
TELEVISIT    Interpreter: A {DSLANGUAGE:20068} was used today.     SUBJECTIVE  Chief Complaint: venlafaxine taper    History of Present Illness: Patient Sara Snyder is a 67 year old female with PMHx *** who presents via tele-visit for:    # venlafaxine taper  -   Encourage call psych switch providers     Daughter only present, informed pt needs to be on the phone   Taking 150 mg daily but does want to taper off of it   Made a calendar to discuss     -Taper venlafaxine over the next 4 weeks with the following schedule:  Week 1: alternate every other day between 150 mg and 112.5 mg (75mg  capsule + 37.5 mg capsule)  Week 2: alternate every other day between 112.5 mg (75mg  capsule + 37.5 mg capsule) and 75 mg  Week 3: alternate every other day between 75mg  and 37.5 mg  Week 4: alternate every other day between 37.5 mg and nothing   After 4 week taper stop taking and we will start the duloxetine    Review of Systems: Pertinent positives and negatives per HPI.    PMH:  Patient Active Problem List:     Essential hypertension     Labyrinthitis     Panic disorder     Hyperlipidemia     Chronic cough     Mild episode of recurrent major depressive disorder (HCC)     Allergic rhinitis     Chronic pain of both shoulders     Stage 3a chronic kidney disease     Anxiety state     Palpitations     Psychophysiological insomnia     Secondary hyperparathyroidism (HCC)     Osteopenia     Carpal tunnel syndrome of right wrist     Fibromyalgia     Hair loss     Vitamin D deficiency     Obesity (BMI 30-39.9)     Bilateral renal artery stenosis (HCC)     DOE (dyspnea on exertion)     Recurrent epistaxis     Gastroesophageal reflux disease without esophagitis     Other specified hypothyroidism     Combined forms of age-related cataract of right eye     Preoperative examination     Cervical cancer screening     Prediabetes     Healthcare maintenance     Schistosomiasis     Intraoperative floppy iris syndrome (IFIS)     Combined forms of  age-related cataract of left eye     Post-viral cough syndrome     Varicose veins of calf     Chronic bilateral low back pain without sciatica      Past Surgical History:  No date: CATARACT REMOVAL INSERTION OF LENS; Right  10/2016: HEMORRHOID SURGERY  No date: OB ANTEPARTUM CARE CESAREAN DLVR & POSTPARTUM    Social History    Tobacco Use      Smoking status: Former        Packs/day: 2.00        Years: 25.00        Additional pack years: 0.00        Total pack years: 50.00        Types: Cigarettes        Quit date: 10/27/1977        Years since quitting: 44.9      Smokeless tobacco: Never    Alcohol use: Not Currently  No family history on file.        Current Outpatient Medications:     chlorthalidone (HYGROTEN) 25 MG tablet, Take 1 tablet by mouth in the morning., Disp: 90 tablet, Rfl: 3    venlafaxine (EFFEXOR-XR) 37.5 MG 24 hr capsule, -Week 1: alternate every other day between 150 mg and 112.5 mg (75mg  capsule + 37.5 mg capsule) -Week 2: alternate every other day between 112.5 mg (75mg  capsule + 37.5 mg capsule) and 75 mg -Week 3: alternate every other day between 75mg  and 37.5 mg -Week 4: alternate every other day between 37.5 mg and nothing, Disp: 16 capsule, Rfl: 0    venlafaxine (EFFEXOR-XR) 75 MG 24 hr capsule, -Week 1: alternate every other day between 150 mg and 112.5 mg (75mg  capsule + 37.5 mg capsule) -Week 2: alternate every other day between 112.5 mg (75mg  capsule + 37.5 mg capsule) and 75 mg -Week 3: alternate every other day between 75mg  and 37.5 mg -Week 4: alternate every other day between 37.5 mg and nothing, Disp: 16 capsule, Rfl: 0    gabapentin (NEURONTIN) 100 MG capsule, Take 1 capsule by mouth in the morning and 1 capsule at noon and 1 capsule before bedtime., Disp: 90 capsule, Rfl: 2    fluticasone (FLONASE) 50 MCG/ACT nasal spray, 1 spray by Each Nostril route in the morning., Disp: 9.9 mL, Rfl: 1    loratadine (CLARITIN) 10 MG tablet, Take 1 tablet by mouth in the morning., Disp: 30  tablet, Rfl: 2    levothyroxine (SYNTHROID) 50 MCG tablet, Take 1 tablet by mouth every morning before breakfast, Disp: 90 tablet, Rfl: 3    omeprazole (PRILOSEC) 20 MG capsule, Take 1 capsule by mouth in the morning., Disp: 90 capsule, Rfl: 3    clonazePAM (KLONOPIN) 1 MG tablet, 1 tablet two times daily, PRN, Disp: 60 tablet, Rfl: 2    budesonide-formoterol (SYMBICORT) 160-4.5 MCG/ACT inhaler, Inhale 2 puffs into the lungs in the morning and 2 puffs before bedtime. Use 1 puff prn during the day. Rinse mouth after use.., Disp: 1 each, Rfl: 1    venlafaxine (EFFEXOR-XR) 150 MG 24 hr capsule, Take 1 capsule by mouth in the morning., Disp: 30 capsule, Rfl: 2    TruForm Stockings 10-61mmHg (COMPRESSION STOCKINGS), B/l compression stocking, Disp: 2 each, Rfl: 0    atenolol (TENORMIN) 100 MG tablet, Take 1 tablet by mouth nightly, Disp: 90 tablet, Rfl: 1    meclizine (ANTIVERT) 25 MG TABS, Take 1 tablet by mouth every 8 (eight) hours as needed, Disp: 90 tablet, Rfl: 0    amlodipine-valsartan (EXFORGE) 5-320 MG per tablet, Take 1 tablet by mouth in the morning., Disp: 30 tablet, Rfl: 11    diclofenac (VOLTAREN) 1 % GEL Gel, Apply 2 g topically 4 (four) times daily as needed, Disp: 300 g, Rfl: 3    calcium citrate-citamin D (CALCIUM CITRATE +) 315-5 MG-MCG tablet, Take 1 tablet by mouth in the morning and 1 tablet before bedtime., Disp: 180 tablet, Rfl: 3    latanoprost (XALATAN) 0.005 % ophthalmic solution, Place 1 drop into both eyes nightly, Disp: 2.5 mL, Rfl: 12    Elastic Bandages & Supports (RA WRIST BRACE ADJ RIGHT L/XL) MISC, Use as able for carpal tunnel syndrome, Disp: 1 each, Rfl: 0    cholecalciferol (VITAMIN D3) 2000 UNIT tablet, Take 1 tablet by mouth in the morning and 1 tablet before bedtime., Disp: 180 tablet, Rfl: 3    acetaminophen (TYLENOL) 500 MG tablet, Take  1 tablet by mouth every 4 (four) hours as needed for Pain, Disp: 60 tablet, Rfl: 3    Review of Patient's Allergies indicates:   Pollen extract           Cough    OBJECTIVE  There were no vitals taken for this visit. - Tele-visit.    Physical Exam:   Tele-visit - Patient sounds comfortable on the phone, speaking in full and complete sentences. Without shortness of breath, coughing, or audible wheezing.       ASSESSMENT/PLAN    ***diagmed    Televisit in 4 weeks to discuss duloxetine start (rather than discuss today given level of confusion around venlafaxine taper)***       Daughter will call to reshceduled therapy and psychopharm       Follow-Up: t/v 4 weeks to discuss starting duloxetine     Precautions given, all questions answered.     ***CC Chart: PCP, Darl Pikes, MD    {Document time spent to support E&M coding:12984}    Gerald Dexter, PA-C

## 2022-09-21 ENCOUNTER — Ambulatory Visit (HOSPITAL_BASED_OUTPATIENT_CLINIC_OR_DEPARTMENT_OTHER): Payer: No Typology Code available for payment source | Admitting: Pulmonary

## 2022-09-24 ENCOUNTER — Ambulatory Visit
Payer: No Typology Code available for payment source | Attending: Psychiatry | Admitting: Geri Psychiatry (PRV Practice 16)

## 2022-09-24 DIAGNOSIS — F411 Generalized anxiety disorder: Secondary | ICD-10-CM | POA: Diagnosis not present

## 2022-09-24 DIAGNOSIS — F3341 Major depressive disorder, recurrent, in partial remission: Secondary | ICD-10-CM | POA: Diagnosis not present

## 2022-09-24 MED ORDER — TRAZODONE HCL 50 MG PO TABS
ORAL_TABLET | ORAL | 0 refills | Status: DC
Start: 2022-09-24 — End: 2022-10-29

## 2022-09-24 NOTE — Progress Notes (Signed)
ADULT OUTPATIENT PSYCHIATRY - PSYCHOPHARM INITIAL EVALUATION    BH TREATMENT TEAM:  [facility]    REFERRED BY:    Janna Arch, MD  [address]  [place],  [place] [address]    REASON FOR REFERRAL: Psychopharm mgmt    CHIEF COMPLAINT: ***    HISTORY OF PRESENT ILLNESS:    I am feeling to teel you the truth I am in a deep depresion for about 2-3 months. Because I have many difficuty situation in my life one is refjectyion but never bother me before lately I have been things about this I never met my fatehr and my mother never wanted me.adoption to her sister sis did not ereally gave me to her she daiad make her to go to school. When she came back I was an adult with two kids. My mother as son before me that died then me then another child that died she had 3 other kids she left with the two boys because I was a girl. I had contact with my brother sis not like me. I am in [place] and will stay here. We do want to go back but do not have plans only God knows   I life with my daughter. I have 5 kids 4 kids together 4 girl one boy adults now daughter in [place] her husbadn died she is by ehrself on oother living in [place]. Therer were 5 live [facility] on the bottom floor with 3 daughters. She has son 69 y/o and my husband we have our own bedroom.     Work retired but worked as part iof problem si am not retired but I do not receive any pay. One problem fact I have too many I cannot work daughter has bills and does not make a lot of money. 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 If I could I would something hvey work I am older I am 67 and I cannot do things that are hard.   Yes, so that was the situation but my PCP suggested it. I did start but my daughter sadi can you give my mother a med that would make he feel happier but ti s not happening. Yes she asked me to tper off asn then starte the new one.     Daughter says I know her and proble we have is that she has anxiety and doe snot haVE THE PATIENBCE AND WAITING TO TAPER  OFF THE MED. I DO NO TTHGINK THAT THE LOWER make he rmore sad. It is not the main casue of why I agreew ith the PCP plan to hlepw ith fibro     Meds:  2100 atenolol and klonopin   Sleep:  Do not sleep  OOB: 0300 to br to pee back to bed oob at 0700 her son 25 y/o and     Klonipon disp 60 07/14/22 because ususally when I go to pharmacy it is not time yet.     Venlafaxine taking now 112.5 one day 75 qod   Next week will be 75 then 37.5 qod   Worse part sad I have to stay in the hosue I cannot go out and walk I have pain.     CURRENT MEDICATIONS:    Current Outpatient Medications   Medication Sig    venlafaxine (EFFEXOR-XR) 150 MG 24 hr capsule Week 1: alternate every other day between 150 mg and 112.5 mg (75mg  capsule + 37.5 mg capsule) -Week 2: alternate every  other day between 112.5 mg (75mg  capsule + 37.5 mg capsule) and 75 mg -Week 3: alternate every other day between 75mg  and 37.5 mg -Week 4: alternate every other day between 37.5 mg and nothing    chlorthalidone (HYGROTEN) 25 MG tablet Take 1 tablet by mouth in the morning.    venlafaxine (EFFEXOR-XR) 37.5 MG 24 hr capsule -Week 1: alternate every other day between 150 mg and 112.5 mg (75mg  capsule + 37.5 mg capsule) -Week 2: alternate every other day between 112.5 mg (75mg  capsule + 37.5 mg capsule) and 75 mg -Week 3: alternate every other day between 75mg  and 37.5 mg -Week 4: alternate every other day between 37.5 mg and nothing    venlafaxine (EFFEXOR-XR) 75 MG 24 hr capsule -Week 1: alternate every other day between 150 mg and 112.5 mg (75mg  capsule + 37.5 mg capsule) -Week 2: alternate every other day between 112.5 mg (75mg  capsule + 37.5 mg capsule) and 75 mg -Week 3: alternate every other day between 75mg  and 37.5 mg -Week 4: alternate every other day between 37.5 mg and nothing    gabapentin (NEURONTIN) 100 MG capsule Take 1 capsule by mouth in the morning and 1 capsule at noon and 1 capsule before bedtime.    fluticasone (FLONASE) 50 MCG/ACT nasal spray 1  spray by Each Nostril route in the morning.    loratadine (CLARITIN) 10 MG tablet Take 1 tablet by mouth in the morning.    levothyroxine (SYNTHROID) 50 MCG tablet Take 1 tablet by mouth every morning before breakfast    omeprazole (PRILOSEC) 20 MG capsule Take 1 capsule by mouth in the morning.    clonazePAM (KLONOPIN) 1 MG tablet 1 tablet two times daily, PRN    budesonide-formoterol (SYMBICORT) 160-4.5 MCG/ACT inhaler Inhale 2 puffs into the lungs in the morning and 2 puffs before bedtime. Use 1 puff prn during the day. Rinse mouth after use..    TruForm Stockings 10-100mmHg (COMPRESSION STOCKINGS) B/l compression stocking    atenolol (TENORMIN) 100 MG tablet Take 1 tablet by mouth nightly    meclizine (ANTIVERT) 25 MG TABS Take 1 tablet by mouth every 8 (eight) hours as needed    amlodipine-valsartan (EXFORGE) 5-320 MG per tablet Take 1 tablet by mouth in the morning.    diclofenac (VOLTAREN) 1 % GEL Gel Apply 2 g topically 4 (four) times daily as needed    calcium citrate-citamin D (CALCIUM CITRATE +) 315-5 MG-MCG tablet Take 1 tablet by mouth in the morning and 1 tablet before bedtime.    latanoprost (XALATAN) 0.005 % ophthalmic solution Place 1 drop into both eyes nightly    Elastic Bandages & Supports (RA WRIST BRACE ADJ RIGHT L/XL) MISC Use as able for carpal tunnel syndrome    cholecalciferol (VITAMIN D3) 2000 UNIT tablet Take 1 tablet by mouth in the morning and 1 tablet before bedtime.    acetaminophen (TYLENOL) 500 MG tablet Take 1 tablet by mouth every 4 (four) hours as needed for Pain     No current facility-administered medications for this visit.         LANGUAGE OF CARE:    [place] ([place]      LANGUAGE NEEDS MET:    Telephone Interpreter    VITAL SIGNS:   There were no vitals filed for this visit.    LABS:    WHITE BLOOD CELL COUNT (TH/uL)   Date Value   06/11/2022 10.0     RED BLOOD CELL COUNT (M/uL)  Date Value   06/11/2022 3.77 (L)     HEMOGLOBIN (g/dL)   Date Value   17/61/6073 11.0 (L)      No results found for: "IDEALHCT"  HEMATOCRIT (%)   Date Value   06/11/2022 34.1     No results found for: "HCTDIFF"  MEAN CORPUSCULAR VOL (fl)   Date Value   06/11/2022 90.5     MEAN CORPUSCULAR HGB (pg)   Date Value   06/11/2022 29.2     MEAN CORP HGB CONC (g/dL)   Date Value   71/11/2692 32.3     No results found for: "RDW"  PLATELET COUNT (TH/uL)   Date Value   06/11/2022 342     MEAN PLATELET VOLUME (fL)   Date Value   06/11/2022 10.4     NEUTROPHIL % (%)   Date Value   11/13/2019 52.6     LYMPHOCYTE % (%)   Date Value   11/13/2019 39.3     MONOCYTE % (%)   Date Value   11/13/2019 5.4     EOSINOPHIL % (%)   Date Value   11/13/2019 1.9     BASOPHIL % (%)   Date Value   11/13/2019 0.4         ALBUMIN (g/dL)   Date Value   85/46/2703 4.2     ALKALINE PHOSPHATASE (U/L)   Date Value   09/29/2021 108     ALANINE AMINOTRANSFERASE (U/L)   Date Value   09/29/2021 18     ASPARTATE AMINOTRANSFERASE (U/L)   Date Value   09/29/2021 17     Glucose Random (mg/dL)   Date Value   50/02/3817 81     BUN (UREA NITROGEN) (mg/dL)   Date Value   29/93/7169 20 (H)     CALCIUM (mg/dL)   Date Value   67/89/3810 10.0     CHLORIDE (mmol/L)   Date Value   06/11/2022 99     CARBON DIOXIDE (mmol/L)   Date Value   06/11/2022 32     CREATININE (mg/dL)   Date Value   17/51/0258 1.1     POTASSIUM (mmol/L)   Date Value   06/11/2022 4.1     SODIUM (mmol/L)   Date Value   06/11/2022 142     BILIRUBIN TOTAL (mg/dL)   Date Value   52/77/8242 0.2     TOTAL PROTEIN (g/dL)   Date Value   35/36/1443 7.3          THYROID SCREEN TSH REFLEX FT4 (uIU/mL)   Date Value   05/01/2022 3.320     Cholesterol (mg/dL)   Date Value   15/40/0867 215   06/13/2020 215   04/10/2019 223     LOW DENSITY LIPOPROTEIN DIRECT (mg/dL)   Date Value   61/95/0932 153   06/13/2020 141   04/10/2019 159     HIGH DENSITY LIPOPROTEIN (mg/dL)   Date Value   67/05/4579 44   06/13/2020 43   04/10/2019 43     TRIGLYCERIDES (mg/dL)   Date Value   99/83/3825 148   04/10/2019 169 (H)    10/27/2017 243 (H)         HEMOGLOBIN A1C (%)   Date Value   05/25/2022 6.3 (H)   02/24/2022 6.4 (H)   07/14/2021 6.3 (H)     No results found for: "POCA1C"       RPR QUAL (no units)   Date Value   07/20/2019 Non Reactive  RPR QUALITATIVE (no units)   Date Value   01/24/2020 NON-REACTIVE                CURRENT TREATMENT/CONTACT INFO FOR OTHER AGENCIES AND MENTAL HEALTH PROVIDERS (if applicable):     Contact/Community Support    No documentation.                   MEDICAL HISTORY:  Past Medical History:  09/11/2018: Chronic kidney disease, stage III (moderate) (HCC)  08/14/2019: COVID-19      Comment:  Risk category:   high  Date of symptom onset: 08/12/19                 Tested? Positive  Risk factors: CKD, BMI > 30, HTN                 Clinical Course First date of symptoms:  08/12/19                 08/15/19: (DOI 3): Pt states she is feeling well. Slight                ST and sinus congestion. No cough, fever, SOB, DOE or                edema. F/U in 3 days.  08/18/2019: DOI 7- Feeling better                over all, stable vitals, CM f/u in 2 days. 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)  02/20/2021: Osteopenia  11/29/2017: Panic disorder  No date: Wears eyeglasses     PROBLEM LIST:  Patient Active Problem List:     Essential hypertension     Labyrinthitis     Panic disorder     Hyperlipidemia     Chronic cough     Mild episode of recurrent major depressive disorder (HCC)     Allergic rhinitis     Chronic pain of both shoulders     Stage 3a chronic kidney disease     Anxiety state     Palpitations     Psychophysiological insomnia     Secondary hyperparathyroidism (HCC)     Osteopenia     Carpal tunnel syndrome of right wrist     Fibromyalgia     Hair loss     Vitamin D deficiency     Obesity (BMI 30-39.9)     Bilateral renal artery stenosis (HCC)     DOE (dyspnea on  exertion)     Recurrent epistaxis     Gastroesophageal reflux disease without esophagitis     Other specified hypothyroidism     Combined forms of age-related cataract of right eye     Preoperative examination     Cervical cancer screening     Prediabetes     Healthcare maintenance     Schistosomiasis     Intraoperative floppy iris syndrome (IFIS)     Combined forms of age-related cataract of left eye     Post-viral cough syndrome     Varicose veins of calf     Chronic bilateral low back pain without sciatica      BIOLOGICAL FAMILY HISTORY/FAMILY CONSTELLATION:    No family history on file.       PAST  PSYCHIATRIC HISTORY:   Past Psychiatric History:    No documentation.                     PSYCHOSOCIAL:   OP Psychosocial Review    No documentation.                   SUBSTANCE USE:     Substance Use    No documentation.                    [place] RISK ASSESSMENTS:     Suicide:   C-SSRS OP Triage Screener    No documentation.                  C-SSRS Risk Assessment    No documentation.                   VIOLENCE:    Violence/Abuse Risk    No documentation.                   PROTECTIVE FACTORS:   Protective Factors    No documentation.                   RISK ASSESSMENTS:       Violence: {:10827}     Addiction: {:10827}      ADDITIONAL SCREENINGS:       01/14/2021     9:20 AM 11/06/2021     1:57 PM 11/06/2021     3:24 PM   PHQ9 SCREENING   Patient refused PHQ-9 No  No   Little interest or pleasure in doing things More than Half the Days Not at all Several Days   Feeling down, depressed, or hopeless Nearly Every Day Not at all Several Days   Trouble falling asleep, staying asleep, or sleeping too much Nearly Every Day  Not at all   Feeling tired or having low energy Nearly Every Day  Several Days   Poor appetite or overeating Not at all  Not at all   Feeling bad about yourself - or that you are a failure or have let yourself or your family down Not at all  Not at all   Trouble concentrating on things, such as school work,  reading the newspaper, or watching television Not at all  Not at all   Moving or speaking slowly that other people could have noticed.  Or the opposite - being so fidgety or restless that you have been  moving around a lot more than ususal Not at all  Not at all   Thoughts that you would be better off dead or of hurting yourself in some way Not at all  Not at all   TOTAL 11  3   If you checked off any problems, how difficult have these problems made it for you to do your work, take care of things at home, or get along with people? Somewhat difficult  Not difficult at all         11/06/2021     3:24 PM 11/06/2021     1:57 PM 06/13/2020     9:44 AM   GAD-7 FLOWSHEET   Feeling Nervous/Anxious/ On Edge 1 1 1    Unable to Stop Worrying 0 0 1          REVIEW OF SYSTEMS:        MENTAL STATUS EXAMINATION:   Mental Status Exam    No documentation.  BIO/PSYCHO/SOCIAL AND RISK FORMULATION(S): ***    DIAGNOSES:    No diagnosis found.    PLAN:   ***  INFORMED CONSENT - for any new medication:   {Informed Consent:14135:::0}     CARE PLAN:  No linked episodes         Joella PrinceWilliam Joie Reamer, APRN.

## 2022-10-01 ENCOUNTER — Encounter (HOSPITAL_BASED_OUTPATIENT_CLINIC_OR_DEPARTMENT_OTHER): Payer: Self-pay

## 2022-10-01 DIAGNOSIS — I10 Essential (primary) hypertension: Secondary | ICD-10-CM

## 2022-10-09 ENCOUNTER — Other Ambulatory Visit (HOSPITAL_BASED_OUTPATIENT_CLINIC_OR_DEPARTMENT_OTHER): Payer: Self-pay

## 2022-10-09 ENCOUNTER — Encounter (HOSPITAL_BASED_OUTPATIENT_CLINIC_OR_DEPARTMENT_OTHER): Payer: Self-pay

## 2022-10-09 DIAGNOSIS — M797 Fibromyalgia: Secondary | ICD-10-CM

## 2022-10-09 DIAGNOSIS — F330 Major depressive disorder, recurrent, mild: Secondary | ICD-10-CM

## 2022-10-09 MED ORDER — DULOXETINE HCL 30 MG PO CPEP
30.0000 mg | ORAL_CAPSULE | Freq: Every day | ORAL | 0 refills | Status: DC
Start: 2022-10-09 — End: 2022-10-29

## 2022-10-09 MED ORDER — DULOXETINE HCL 60 MG PO CPEP
60.0000 mg | ORAL_CAPSULE | Freq: Every day | ORAL | 0 refills | Status: DC
Start: 2022-10-09 — End: 2022-10-29

## 2022-10-29 ENCOUNTER — Ambulatory Visit
Payer: No Typology Code available for payment source | Attending: Psychiatry | Admitting: Geri Psychiatry (PRV Practice 16)

## 2022-10-29 DIAGNOSIS — F3341 Major depressive disorder, recurrent, in partial remission: Secondary | ICD-10-CM | POA: Diagnosis present

## 2022-10-29 DIAGNOSIS — F411 Generalized anxiety disorder: Secondary | ICD-10-CM

## 2022-10-29 MED ORDER — DULOXETINE HCL 30 MG PO CPEP
30.0000 mg | ORAL_CAPSULE | Freq: Every day | ORAL | 1 refills | Status: DC
Start: 2022-10-29 — End: 2022-12-02

## 2022-10-29 NOTE — Progress Notes (Signed)
ADULT OUTPATIENT PSYCHIATRY (OPD)  PSYCHOPHARM FOLLOW UP    BH TREATMENT TEAM:   GERI    LANGUAGE OF CARE:   [place] ([place]      LANGUAGE NEEDS MET:  10/29/22 Sara Snyder    CHIEF COMPLAINT:   "She was sleeping"    INTERVAL HISTORY:    10/29/22 daughter Sara Snyder [phone]. & patient speak simultaneously. "The meds are working but I sleep all day. I take the trazodone 25 mg for 3 days it was bad I felt my brain was confused everything was mixed up. At night I take atenolol and klonopin. I take the klonopin many yrs 30 yrs to calm and sleep.  At night I take both meds at 2330. I awake and get OOB at 0600 have coffee then go back to bed until 1000 because I do not work. After lunch she sleeps a little bit. I take the other klonopin in other pill qd prn. I need it in the day to keep my BP low. I use it 3-4 times per week. The cross taper ended about 10/11/22. When ser started the 30 mg 4/21 she was better mood and energy was better. She ws doing exercises. Then two weeks to 60 mg now very sleepy all the time more tired. Sindhuja said I do not know daughter do you think it was the increase is responsible for the sleepiness".         OBJECTIVE DATA:  CURRENT MEDICATIONS:      DULoxetine (CYMBALTA) 30 MG capsule, Take 1 capsule by mouth in the morning.    chlorthalidone (HYGROTEN) 25 MG tablet, Take 1 tablet by mouth in the morning.    gabapentin (NEURONTIN) 100 MG capsule, Take 1 capsule by mouth in the morning and 1 capsule at noon and 1 capsule before bedtime.    levothyroxine (SYNTHROID) 50 MCG tablet, Take 1 tablet by mouth every morning before breakfast    omeprazole (PRILOSEC) 20 MG capsule, Take 1 capsule by mouth in the morning.    clonazePAM (KLONOPIN) 1 MG tablet, 1 tablet two times daily, PRN    budesonide-formoterol (SYMBICORT) 160-4.5 MCG/ACT inhaler, Inhale 2 puffs into the lungs in the morning and 2 puffs before bedtime. Use 1 puff prn during the day. Rinse mouth after use..    TruForm Stockings 10-25mmHg  (COMPRESSION STOCKINGS), B/l compression stocking    atenolol (TENORMIN) 100 MG tablet, Take 1 tablet by mouth nightly    meclizine (ANTIVERT) 25 MG TABS, Take 1 tablet by mouth every 8 (eight) hours as needed    amlodipine-valsartan (EXFORGE) 5-320 MG per tablet, Take 1 tablet by mouth in the morning.    diclofenac (VOLTAREN) 1 % GEL Gel, Apply 2 g topically 4 (four) times daily as needed    calcium citrate-citamin D (CALCIUM CITRATE +) 315-5 MG-MCG tablet, Take 1 tablet by mouth in the morning and 1 tablet before bedtime.    latanoprost (XALATAN) 0.005 % ophthalmic solution, Place 1 drop into both eyes nightly    Elastic Bandages & Supports (RA WRIST BRACE ADJ RIGHT L/XL) MISC, Use as able for carpal tunnel syndrome    cholecalciferol (VITAMIN D3) 2000 UNIT tablet, Take 1 tablet by mouth in the morning and 1 tablet before bedtime.    acetaminophen (TYLENOL) 500 MG tablet, Take 1 tablet by mouth every 4 (four) hours as needed for Pain      VITAL SIGNS:   There were no vitals filed for this visit.    LABS:  Lab Results   Component Value Date/Time    CHOLESTEROL 215 07/14/2021 10:26 AM    TG 148 06/13/2020 10:28 AM    HDL 44 07/14/2021 10:26 AM    LDL 153 07/14/2021 10:26 AM    NA 142 06/11/2022 09:56 AM    K 4.1 06/11/2022 09:56 AM    BUN 20 (H) 06/11/2022 09:56 AM    CREAT 1.1 06/11/2022 09:56 AM    WBC 10.0 06/11/2022 09:56 AM    HCT 34.1 06/11/2022 09:56 AM    PLTA 342 06/11/2022 09:56 AM    TSHSC 3.320 05/01/2022 04:09 PM    B12 311 01/24/2020 11:37 AM    HGBA1C 6.3 (H) 05/25/2022 02:21 PM         Pregnancy/Birth Control (for female patients): No    CURRENT TREATMENT/CONTACT INFO FOR OTHER AGENCIES AND MENTAL HEALTH PROVIDERS (if applicable):     Contact/Community Support    No documentation.                   COLUMBIARISK ASSESSMENTS:     Suicide:  [facility] OP LAST CONTACT      Flowsheet Row BH Televisit from 05/19/2022 in [facility]   Have you wished you were dead or  wished you could go to sleep and not wake up? No   Have you actually had any thoughts of killing yourself?  No   Have you done anything, started to do anything, or prepared to do anything to end your life? No   C-SSRS OP Last Contact Screener Risk Score No Risk            C-SSRS 10/29/22 Low      ADDITIONAL SCREENINGS:       01/14/2021     9:20 AM 11/06/2021     1:57 PM 11/06/2021     3:24 PM   PHQ9 SCREENING   Patient refused PHQ-9 No  No   Little interest or pleasure in doing things More than Half the Days Not at all Several Days   Feeling down, depressed, or hopeless Nearly Every Day Not at all Several Days   Trouble falling asleep, staying asleep, or sleeping too much Nearly Every Day  Not at all   Feeling tired or having low energy Nearly Every Day  Several Days   Poor appetite or overeating Not at all  Not at all   Feeling bad about yourself - or that you are a failure or have let yourself or your family down Not at all  Not at all   Trouble concentrating on things, such as school work, reading the newspaper, or watching television Not at all  Not at all   Moving or speaking slowly that other people could have noticed.  Or the opposite - being so fidgety or restless that you have been  moving around a lot more than ususal Not at all  Not at all   Thoughts that you would be better off dead or of hurting yourself in some way Not at all  Not at all   TOTAL 11  3   If you checked off any problems, how difficult have these problems made it for you to do your work, take care of things at home, or get along with people? Somewhat difficult  Not difficult at all         11/06/2021     3:24 PM 11/06/2021     1:57 PM 06/13/2020  9:44 AM   GAD-7 FLOWSHEET   Feeling Nervous/Anxious/ On Edge 1 1 1    Unable to Stop Worrying 0 0 1          REVIEW OF SYSTEMS:   Not assessed today     MENTAL STATUS EXAMINATION:  Appearance:                Unknown given the tele nature of this visit   Behavior:                     Pleasant &  cooperative   Alertness:                    A&O x's 3  Speech:                    Clear not pressured  Mood:                         "depressed"     10/29/22 "tired"  Affect:                        Unknown given the tele nature of this visit  Thought Process:      Logical & Lucid  Thought Content:        W/O delusions   Perceptions:               Denies any AVH                                      Judgment:                  Good  Impulse Control:        Good    Insight:                        Good  Cognition:                 Intact   Suicidal/Homicidal:     Denies    Sleep:    Meds: 2330      Bed: 2400      OOB: 0600>coffee pee back to bed until 1000      Naps: Afternoon watching TV    RISK ASSESSMENTS:       Violence:   Low     Addiction:   Low    BIO/PSYCHO/SOCIAL AND RISK FORMULATION(S):    Sara Snyder is a 67 y/o PO speaking woman with a history of CKD Stage 3, HTN, Fibromyalgia, Hypothyroidism, Pre-DM, Obesity, and Depression with anxiety & Panic. Her current psych med regimen consists of Effexor 262.5 (150, 75, 37.5), Klonopin 1 mg BID. Her care is transferred to TW from formed provider Sara Cleon Dew. Patient's [place] association began 02/16/18 when patient was seen by Sara Fatima Blank. Sara Fatima Blank followed patient until she was transferred to Sara Cleon Dew 07/06/2019, who followed patient until April 2024. Sara Cleon Dew wrote: "67 y/o [place] female with longstanding MDD, recurrent, GAD, and Panic D/O now in need of psychopharm follow up, and counseling though she has dropped out of same in the past".     ASSESSMENT:     10/29/22 Patient is overall doing better. The cross taper seems helpful except for hypersomnolence and inability to engage. Denies active somatic symptoms.  Denies suicidal ideation or neurovegetative symptoms. Reports adverse effects of medications, ie., trazodone stopped after 3 days - next day hang over; duloxetine 60 mg too sedating. Therefore we will go back to 30. States that patient is eating well. No  perceptual disturbances noted.     DIAGNOSIS:    (F33.0) MDD                                                             (F41.1) GAD       PLAN:   - Tele visit 12/02/22 @ 1630 hrs daughter Sara Snyder [phone]  - 09/24/22 add Trazodone 25-50 po qhs thru 09/2023  - 10/29/22 stop trazodone due to next day hang over feeling  - 10/29/22 lower cymbalta 60>30 due to hypersomnolence and lessening of engagement   - Encouraged patient not to return to bed after getting children off to school in the am.     INFORMED CONSENT - for any new medication:   Patient and guardian/caretaker were informed of the potential risks and benefits of the treatment, including the option not to treat, and appeared to understand and agreed to comply. Discussion included the following key points:  .   How and why to take meds being prescribed; how and when to contact TW. Call [facility] 24/7 @ [phone] for any BH reason     COUNSELING AND COORDINATION OF CARE PROVIDED:   I spent a total of 30 minutes on this visit on the date of service (total time includes all activities performed on the date of service)    CARE PLAN/ EPISODES:  No linked episodes             Joella Prince, APRN  .

## 2022-11-06 ENCOUNTER — Encounter (HOSPITAL_BASED_OUTPATIENT_CLINIC_OR_DEPARTMENT_OTHER): Payer: Self-pay

## 2022-12-02 ENCOUNTER — Ambulatory Visit
Payer: No Typology Code available for payment source | Attending: Psychiatry | Admitting: Geri Psychiatry (PRV Practice 16)

## 2022-12-02 DIAGNOSIS — F411 Generalized anxiety disorder: Secondary | ICD-10-CM | POA: Insufficient documentation

## 2022-12-02 DIAGNOSIS — F410 Panic disorder [episodic paroxysmal anxiety] without agoraphobia: Secondary | ICD-10-CM | POA: Diagnosis not present

## 2022-12-02 DIAGNOSIS — F3341 Major depressive disorder, recurrent, in partial remission: Secondary | ICD-10-CM | POA: Insufficient documentation

## 2022-12-02 MED ORDER — CLONAZEPAM 1 MG PO TABS
ORAL_TABLET | ORAL | 2 refills | Status: DC
Start: 2022-12-21 — End: 2023-01-13

## 2022-12-02 MED ORDER — DULOXETINE HCL 30 MG PO CPEP
30.0000 mg | ORAL_CAPSULE | Freq: Every day | ORAL | 2 refills | Status: DC
Start: 2022-12-21 — End: 2023-01-13

## 2022-12-02 NOTE — Progress Notes (Signed)
ADULT OUTPATIENT PSYCHIATRY (OPD)  PSYCHOPHARM FOLLOW UP    BH TREATMENT TEAM:   GERI    LANGUAGE OF CARE:   [place] ([place]      LANGUAGE NEEDS MET:  10/29/22 [place] [name]       12/02/22 [place] [name]    CHIEF COMPLAINT:   "Yes, I remember you"    INTERVAL HISTORY:    10/29/22 daughter Sara Snyder [phone]. & patient speak simultaneously. "The meds are working but I sleep all day. I take the trazodone 25 mg for 3 days it was bad I felt my brain was confused everything was mixed up. At night I take atenolol and klonopin. I take the klonopin many yrs 30 yrs to calm and sleep.  At night I take both meds at 2330. I awake and get OOB at 0600 have coffee then go back to bed until 1000 because I do not work. After lunch she sleeps a little bit. I take the other klonopin in other pill qd prn. I need it in the day to keep my BP low. I use it 3-4 times per week. The cross taper ended about 10/11/22. When ser started the 30 mg 4/21 she was better mood and energy was better. She ws doing exercises. Then two weeks to 60 mg now very sleepy all the time more tired. Sara Snyder said I do not know daughter do you think it was the increase is responsible for the sleepiness".    12/02/22 "I am depressed and bored. There are some days I can sleep but there are times when I get no shut eye. I have issues that have come up mainly major swelling on my legs and my body too. Just walking I cannot walk on my own. I feel lost. So, my husband is caring for the kids (getting grand children ready for the school bus) I do not feel like doing anything. All I want to do is nothing. The swelling and pain in my body and knees. I cannot get up on my own. No my PCP has not called me and my daughter has not called yet. My PCP is doing nothing. No, I am not suicidal. But I feel scare and worried about my kidneys. And I have kidney issues. The doctors told my daughter. But I do not understand english. I take the klonopin when I have feeling of depression I take 1/2  because I have to cook dinner. Today I did not take it because I awoke today at 1300 hrs. I took it yesterday twice. Last night I went to bed at 0300. Usually I go to bed at 2400 or 0100. I stay at home in the am because my husband goes for a walk and I cannot join him. I have nothing to do in the day so I go back to bed. (Can you do your computer in the am not at night time?). I can try to do computer work in the am. Yes, I can meet your replacement in [facility] the [facility]. Yes, I would like to s/w a therapist who speaks PO".       OBJECTIVE DATA:  CURRENT MEDICATIONS:      atenolol (TENORMIN) 100 MG tablet, Take 1 tablet by mouth nightly    levothyroxine (SYNTHROID) 50 MCG tablet, Take 1 tablet by mouth every morning before breakfast    [START ON 12/21/2022] DULoxetine (CYMBALTA) 30 MG capsule, Take 1 capsule by mouth in the morning.    [START ON 12/21/2022] clonazePAM (  KLONOPIN) 1 MG tablet, 1 tablet two times daily, PRN    chlorthalidone (HYGROTEN) 25 MG tablet, Take 1 tablet by mouth in the morning.    gabapentin (NEURONTIN) 100 MG capsule, Take 1 capsule by mouth in the morning and 1 capsule at noon and 1 capsule before bedtime.    omeprazole (PRILOSEC) 20 MG capsule, Take 1 capsule by mouth in the morning.    budesonide-formoterol (SYMBICORT) 160-4.5 MCG/ACT inhaler, Inhale 2 puffs into the lungs in the morning and 2 puffs before bedtime. Use 1 puff prn during the day. Rinse mouth after use..    TruForm Stockings 10-46mmHg (COMPRESSION STOCKINGS), B/l compression stocking    amlodipine-valsartan (EXFORGE) 5-320 MG per tablet, Take 1 tablet by mouth in the morning.    diclofenac (VOLTAREN) 1 % GEL Gel, Apply 2 g topically 4 (four) times daily as needed    calcium citrate-citamin D (CALCIUM CITRATE +) 315-5 MG-MCG tablet, Take 1 tablet by mouth in the morning and 1 tablet before bedtime.    latanoprost (XALATAN) 0.005 % ophthalmic solution, Place 1 drop into both eyes nightly    cholecalciferol (VITAMIN D3) 2000  UNIT tablet, Take 1 tablet by mouth in the morning and 1 tablet before bedtime.        VITAL SIGNS:   There were no vitals filed for this visit.    LABS:    Lab Results   Component Value Date/Time    CHOLESTEROL 215 07/14/2021 10:26 AM    TG 148 06/13/2020 10:28 AM    HDL 44 07/14/2021 10:26 AM    LDL 153 07/14/2021 10:26 AM    NA 142 06/11/2022 09:56 AM    K 4.1 06/11/2022 09:56 AM    BUN 20 (H) 06/11/2022 09:56 AM    CREAT 1.1 06/11/2022 09:56 AM    WBC 10.0 06/11/2022 09:56 AM    HCT 34.1 06/11/2022 09:56 AM    PLTA 342 06/11/2022 09:56 AM    TSHSC 3.320 05/01/2022 04:09 PM    B12 311 01/24/2020 11:37 AM    HGBA1C 6.3 (H) 05/25/2022 02:21 PM         Pregnancy/Birth Control (for female patients): No    CURRENT TREATMENT/CONTACT INFO FOR OTHER AGENCIES AND MENTAL HEALTH PROVIDERS (if applicable):     Contact/Community Support    No documentation.                   COLUMBIARISK ASSESSMENTS:     Suicide:  [facility] OP LAST CONTACT      Flowsheet Row BH Televisit from 05/19/2022 in [facility]   Have you wished you were dead or wished you could go to sleep and not wake up? No   Have you actually had any thoughts of killing yourself?  No   Have you done anything, started to do anything, or prepared to do anything to end your life? No   C-SSRS OP Last Contact Screener Risk Score No Risk            C-SSRS 10/29/22 Low      ADDITIONAL SCREENINGS:       01/14/2021     9:20 AM 11/06/2021     1:57 PM 11/06/2021     3:24 PM   PHQ9 SCREENING   Patient refused PHQ-9 No  No   Little interest or pleasure in doing things More than Half the Days Not at all Several Days   Feeling down, depressed, or hopeless Nearly  Every Day Not at all Several Days   Trouble falling asleep, staying asleep, or sleeping too much Nearly Every Day  Not at all   Feeling tired or having low energy Nearly Every Day  Several Days   Poor appetite or overeating Not at all  Not at all   Feeling bad about yourself - or that you are  a failure or have let yourself or your family down Not at all  Not at all   Trouble concentrating on things, such as school work, reading the newspaper, or watching television Not at all  Not at all   Moving or speaking slowly that other people could have noticed.  Or the opposite - being so fidgety or restless that you have been  moving around a lot more than ususal Not at all  Not at all   Thoughts that you would be better off dead or of hurting yourself in some way Not at all  Not at all   TOTAL 11  3   If you checked off any problems, how difficult have these problems made it for you to do your work, take care of things at home, or get along with people? Somewhat difficult  Not difficult at all         11/06/2021     3:24 PM 11/06/2021     1:57 PM 06/13/2020     9:44 AM   GAD-7 FLOWSHEET   Feeling Nervous/Anxious/ On Edge 1 1 1    Unable to Stop Worrying 0 0 1          REVIEW OF SYSTEMS:   Not assessed today     MENTAL STATUS EXAMINATION:  Appearance:                Unknown given the tele nature of this visit   Behavior:                     Pleasant & cooperative   Alertness:                    A&O x's 3  Speech:                    Clear not pressured  Mood:                         "depressed"     10/29/22 "tired"     12/02/22 "depressed"  Affect:                        Unknown given the tele nature of this visit  Thought Process:      Logical & Lucid  Thought Content:        W/O delusions   Perceptions:               Denies any AVH                                      Judgment:                  Good  Impulse Control:        Good    Insight:                        Good  Cognition:                 Intact   Suicidal/Homicidal:     Denies    Sleep:    Meds: 2330      Bed: 2400      OOB: 0600>coffee pee back to bed until 1000      Naps: Afternoon watching TV    RISK ASSESSMENTS:       Violence:   Low     Addiction:   Low    BIO/PSYCHO/SOCIAL AND RISK FORMULATION(S):    Sara Snyder is a 67 y/o PO speaking woman with a  history of CKD Stage 3, HTN, Fibromyalgia, Hypothyroidism, Pre-DM, Obesity, and Depression with anxiety & Panic. Her current psych med regimen consists of Effexor 262.5 (150, 75, 37.5), Klonopin 1 mg BID. Her care is transferred to TW from formed provider Sara Cleon Dew. Patient's [place] association began 02/16/18 when patient was seen by Sara Fatima Blank. Sara Fatima Blank followed patient until she was transferred to Sara Cleon Dew 07/06/2019, who followed patient until April 2024. Sara Cleon Dew wrote: "67 y/o [place] female with longstanding MDD, recurrent, GAD, and Panic D/O now in need of psychopharm follow up, and counseling though she has dropped out of same in the past".     ASSESSMENT:     10/29/22 Patient is overall doing better. The cross taper seems helpful except for hypersomnolence and inability to engage. Denies active somatic symptoms. Denies suicidal ideation or neurovegetative symptoms. Reports adverse effects of medications, ie., trazodone stopped after 3 days - next day hang over; duloxetine 60 mg too sedating. Therefore we will go back to 30. States that patient is eating well. No perceptual disturbances noted.    12/02/22 Patient is overall doing less well. She complains of active and chronic somatic symptoms. Denies suicidal ideation but complains of neurovegetative symptoms, ie., anxiety and depression mostly due to having little to do and being able to do little due to chronic illness (edema etc).  Denies adverse effects of medications.  States that patient is eating and sleeping poorly. No perceptual disturbances noted.     DIAGNOSIS:    (F33.0) MDD                                                             (F41.1) GAD       PLAN:   - [facility] with my replacement daughter Sara Snyder [phone]  - 09/24/22 add Trazodone 25-50 po qhs thru 09/2023  - 10/29/22 stop trazodone due to next day hang over feeling  - 10/29/22 lower cymbalta 60>30 due to hypersomnolence and lessening of engagement   - Encouraged patient not to  return to bed after getting children off to school in the am.   - 12/02/22 therapy add order for the PO team     INFORMED CONSENT - for any new medication:   Patient and guardian/caretaker were informed of the potential risks and benefits of the treatment, including the option not to treat, and appeared to understand and agreed to comply. Discussion included the following key points:  .   How and why to take meds being prescribed; how and when to contact TW. Call [facility] 24/7 @ [phone] for any BH reason     COUNSELING AND COORDINATION OF CARE  PROVIDED:   I spent a total of 30 minutes on this visit on the date of service (total time includes all activities performed on the date of service)    CARE PLAN/ EPISODES:  No linked episodes             Joella Prince, APRN  .Marland Kitchen

## 2022-12-03 ENCOUNTER — Ambulatory Visit: Payer: No Typology Code available for payment source

## 2022-12-03 ENCOUNTER — Other Ambulatory Visit: Payer: Self-pay

## 2022-12-03 ENCOUNTER — Encounter (HOSPITAL_BASED_OUTPATIENT_CLINIC_OR_DEPARTMENT_OTHER): Payer: Self-pay

## 2022-12-03 ENCOUNTER — Other Ambulatory Visit (HOSPITAL_BASED_OUTPATIENT_CLINIC_OR_DEPARTMENT_OTHER): Payer: Self-pay

## 2022-12-03 VITALS — BP 138/76 | HR 76 | Temp 97.8°F | Resp 16 | Ht 59.06 in | Wt 200.0 lb

## 2022-12-03 DIAGNOSIS — E038 Other specified hypothyroidism: Secondary | ICD-10-CM | POA: Insufficient documentation

## 2022-12-03 DIAGNOSIS — N1831 Chronic kidney disease, stage 3a: Secondary | ICD-10-CM

## 2022-12-03 DIAGNOSIS — I701 Atherosclerosis of renal artery: Secondary | ICD-10-CM | POA: Insufficient documentation

## 2022-12-03 DIAGNOSIS — R600 Localized edema: Secondary | ICD-10-CM

## 2022-12-03 DIAGNOSIS — R7303 Prediabetes: Secondary | ICD-10-CM | POA: Diagnosis present

## 2022-12-03 DIAGNOSIS — I10 Essential (primary) hypertension: Secondary | ICD-10-CM

## 2022-12-03 DIAGNOSIS — Z6841 Body Mass Index (BMI) 40.0 and over, adult: Secondary | ICD-10-CM | POA: Diagnosis present

## 2022-12-03 LAB — URINALYSIS RFLX TO URINE CULT
BACTERIA: >50 PER HPF — AB (ref 0–5)
BILIRUBIN, URINE: NEGATIVE
CRYSTALS: NONE SEEN
GLUCOSE, URINE: NEGATIVE MG/DL
KETONE, URINE: NEGATIVE MG/DL
NITRITE, URINE: NEGATIVE
OCCULT BLOOD, URINE: NEGATIVE
PH URINE: 5.5 (ref 5.0–8.0)
SPECIFIC GRAVITY URINE: 1.025 (ref 1.003–1.035)
SQUAMOUS EPITHELIAL CELLS: >10 PER LPF — AB (ref 0–4)

## 2022-12-03 LAB — COMPREHENSIVE METABOLIC PANEL
ALANINE AMINOTRANSFERASE: 18 U/L (ref 12–45)
ALBUMIN: 4.3 g/dL (ref 3.4–5.2)
ALKALINE PHOSPHATASE: 110 U/L (ref 45–117)
ANION GAP: 12 mmol/L (ref 10–22)
ASPARTATE AMINOTRANSFERASE: 22 U/L (ref 8–34)
BILIRUBIN TOTAL: 0.2 mg/dL (ref 0.2–1.0)
BUN (UREA NITROGEN): 21 mg/dL — ABNORMAL HIGH (ref 7–18)
CALCIUM: 9.8 mg/dL (ref 8.5–10.5)
CARBON DIOXIDE: 27 mmol/L (ref 21–32)
CHLORIDE: 101 mmol/L (ref 98–107)
CREATININE: 1.4 mg/dL — ABNORMAL HIGH (ref 0.4–1.2)
ESTIMATED GLOMERULAR FILT RATE: 41 mL/min — ABNORMAL LOW (ref >60)
Glucose Random: 166 mg/dL — ABNORMAL HIGH (ref 74–160)
POTASSIUM: 3.8 mmol/L (ref 3.5–5.1)
SODIUM: 141 mmol/L (ref 136–145)
TOTAL PROTEIN: 7.2 g/dL (ref 6.4–8.2)

## 2022-12-03 LAB — HEMOGLOBIN A1C
ESTIMATED AVERAGE GLUCOSE: 151 mg/dL (ref 74–160)
HEMOGLOBIN A1C: 6.9 % — ABNORMAL HIGH (ref 4.0–5.6)

## 2022-12-03 LAB — URINE CULTURE WILL NOT BE DONE

## 2022-12-03 LAB — MICROALBUMIN RANDOM URINE
ALB/CREAT RATIO URINE RAN: 35 ug/mg — ABNORMAL HIGH (ref 0–30)
ALBUMIN URINE RANDOM: 9.8 mg/dL — ABNORMAL HIGH (ref 0.0–1.9)
CREATININE RANDOM URINE: 284 mg/dL — ABNORMAL HIGH (ref 28–217)

## 2022-12-03 MED ORDER — LEVOTHYROXINE SODIUM 50 MCG PO TABS
50.0000 ug | ORAL_TABLET | Freq: Every morning | ORAL | 3 refills | Status: DC
Start: 2022-12-03 — End: 2023-11-12

## 2022-12-03 NOTE — Progress Notes (Signed)
HPI  Kynzlee Kassner is a 67 year old female for scheduled appointment    Interview was in [place] with in person interpreter Pollyann Savoy  Here with daughter Debe Coder   She has 4 daughters 1 son  History from both daughter and primarily mom     Social History    Social History Narrative      Originally from [place], [place]      History of childhood trauma      Lives with family      The combined HPI and assessment and plan are below; with any medical information and history updated and reviewed.     Physical Exam  BP 138/76   Pulse 76   Temp 97.8 F (36.6 C) (Temporal)   Resp 16   Ht 4' 11.06" (1.5 m)   Wt 90.7 kg (200 lb)   SpO2 97%   BMI 40.32 kg/m   Pain Score: Data Unavailable    Comfortable appearing, put together older  woman,   EOMI   Alert and oriented to person, place, and time  Appropriate mood, affect, tone.Peri [place] eye contact, pleasant     Assessment and Plan    Problem List             Diagnosed       Unprioritized    B/l lower extremity edema  2+ pitting edema b/l  Ext warm well perfused    Reviewed some element of dependent edema  Kidneys and losing protein can be a part of this  Lastly check heart function (no signs/sx CHF with no SOB< DOE, orthopnea, etc)     - renal function  - UA for proteinuria  - TTE for EF, diastolic dysfxn    Stage 3a chronic kidney disease     Overview     Likely d/t HTN and renal artery stenosis.   2023 GFR has remained stable    Reheck in the setting of markedly worsened LE edema          Bilateral renal artery stenosis (HCC)     Overview     Seen on renal duplex US 10/2019. Per vascular OV 06/2020, NTD unless BP or CKD drastically worse.   If renal function changes, recheck  Not sure why she is not on statin - would highly reconsider        Other specified hypothyroidism     Overview     She ran out of medication- had been conrolled on LT4 50 mg, but hasn't taken it for 5 days    Refilled can check in 6 weeks        Relevant Medications    levothyroxine (SYNTHROID) 50 MCG  tablet    Prediabetes     Overview     HEMOGLOBIN A1C (%)           05/25/2022 6.3 (H)   02/24/2022 6.4 (H)     Has been stabe/diet controlled PreDM - recheck              Relevant Orders    HEMOGLOBIN A1C (Completed)    Morbid obesity with BMI 40.0-44.9, adult (HCC)  - Risk stratify for metabolic issues with A1c, lipids-   - Liver tests to screen for NAFLD/NASH- check today  - TSH has been controlled on LT4- no recheck today as above          Answered all patients questions and daughters questions    Results via mychart  Follow up pending  lab results, with me or Jeanell Sparrow, PA  sooner PRN     Aashir Umholtz R. Delice Bison, MD  12/04/22 5:01 PM      Time spent was 51 minutes on the day of service on:  Preparing to see the patient (e.g., review of tests)  and/or Obtaining and/or reviewing separately obtained history  and/or Performing a medically appropriate examination and/or evaluation  and/or Counseling and educating the patient, family, or caregiver  and/or Ordering medications, tests, or procedures  and/or Referring and communicating with other health care professionals (when not separately reported)  and/orDocumenting clinical information in the electronic or other health record  and/or Independently interpreting results (not separately reported) and communicating results to the patient, family, or caregiver  and/or Care coordination (not separately reported)

## 2022-12-04 ENCOUNTER — Encounter (HOSPITAL_BASED_OUTPATIENT_CLINIC_OR_DEPARTMENT_OTHER): Payer: Self-pay

## 2022-12-04 NOTE — Telephone Encounter (Signed)
This prescription refill request has passed the Rx Renewal Authorization protocol.  This medication has been approved and sent to the patient's preferred pharmacy.      [facility], Sara Snyder is a 67 year old female has requested a refill of atenolol.      Last Office Visit: 12/03/22 with Glean Hess  Last Physical Exam: 06/13/20    There are no preventive care reminders to display for this patient.    Other Med Adult:  Most Recent BP Reading(s)  12/03/22 : 138/76        Cholesterol (mg/dL)   Date Value   16/03/9603 215     LOW DENSITY LIPOPROTEIN DIRECT (mg/dL)   Date Value   54/02/8118 153     HIGH DENSITY LIPOPROTEIN (mg/dL)   Date Value   14/78/2956 44     TRIGLYCERIDES (mg/dL)   Date Value   21/30/8657 148         THYROID SCREEN TSH REFLEX FT4 (uIU/mL)   Date Value   05/01/2022 3.320         TSH (THYROID STIM HORMONE) (uIU/mL)   Date Value   01/14/2021 7.090 (H)       HEMOGLOBIN A1C (%)   Date Value   12/03/2022 6.9 (H)       No results found for: "POCA1C"      No results found for: "INR"    SODIUM (mmol/L)   Date Value   12/03/2022 141       POTASSIUM (mmol/L)   Date Value   12/03/2022 3.8           CREATININE (mg/dL)   Date Value   84/69/6295 1.4 (H)       Documented patient preferred pharmacies:    [facility] [place] [place] - [address]  Phone: [phone] Fax: [phone]

## 2022-12-06 ENCOUNTER — Encounter (HOSPITAL_BASED_OUTPATIENT_CLINIC_OR_DEPARTMENT_OTHER): Payer: Self-pay | Admitting: Geri Psychiatry (PRV Practice 16)

## 2022-12-06 NOTE — Progress Notes (Signed)
PSYCHIATRY TERMINATION NOTE     Date treatment started:  [place]: 02/16/18      TW: 09/25/22     Termination date:   12/02/22      Termination reason:   TW's retirement       Reason for treatment:  GAD, MDD     Treatment course:  This patient was seen twice by me. She was a transfer from [employer] from the PO team.       WE made no med changes      Her mood worsened with worsening chronic medical issues.      12/02/22 urgent ADD order place to PO team for therapy to help her implement CBT techniques to ease her pain and anxiety      Outstanding Issues:  None     COLUMBIA RISK ASSESSMENTS:  CSSR OP TRIAGE      Flowsheet Row BH Televisit from 10/29/2022 in [facility]   Within the past month, have you wished you were dead or wished you could go to sleep and not wake up?  No   Within the past month, have you had any actual thoughts of killing yourself?  No   In your lifetime, have you ever done anything, started to do anything, or prepared to do anything to end your life? No   C-SSRS OP Triage Screener Risk Score No Risk          CSSR OP LAST CONTACT      Flowsheet Row BH Televisit from 05/19/2022 in [facility]   Have you wished you were dead or wished you could go to sleep and not wake up? No   Have you actually had any thoughts of killing yourself?  No   Have you done anything, started to do anything, or prepared to do anything to end your life? No   C-SSRS OP Last Contact Screener Risk Score No Risk          CSSR OP RISK ASSESSMENT    No data to display          RISK ASSESSMENTS:     Violence: low (1)      Addiction: low (1)      Plan:   - Add/Change for F2F at [facility] bldg with my replacement     .

## 2022-12-07 ENCOUNTER — Encounter (LOCAL_COMMUNITY_HEALTH_CENTER): Payer: Self-pay | Admitting: Geri Psychiatry (PRV Practice 16)

## 2022-12-09 ENCOUNTER — Ambulatory Visit: Payer: No Typology Code available for payment source

## 2022-12-09 ENCOUNTER — Other Ambulatory Visit: Payer: Self-pay

## 2022-12-09 DIAGNOSIS — N179 Acute kidney failure, unspecified: Secondary | ICD-10-CM | POA: Diagnosis present

## 2022-12-09 DIAGNOSIS — N1831 Chronic kidney disease, stage 3a: Secondary | ICD-10-CM | POA: Insufficient documentation

## 2022-12-09 LAB — BASIC METABOLIC PANEL
ANION GAP: 12 mmol/L (ref 10–22)
BUN (UREA NITROGEN): 19 mg/dL — ABNORMAL HIGH (ref 7–18)
CALCIUM: 9.7 mg/dL (ref 8.5–10.5)
CARBON DIOXIDE: 29 mmol/L (ref 21–32)
CHLORIDE: 99 mmol/L (ref 98–107)
CREATININE: 1.2 mg/dL (ref 0.4–1.2)
ESTIMATED GLOMERULAR FILT RATE: 49 mL/min — ABNORMAL LOW (ref >60)
Glucose Random: 109 mg/dL (ref 74–160)
POTASSIUM: 3.9 mmol/L (ref 3.5–5.1)
SODIUM: 140 mmol/L (ref 136–145)

## 2022-12-09 NOTE — Progress Notes (Signed)
Labs drawn.  1 SST

## 2022-12-18 ENCOUNTER — Encounter (HOSPITAL_BASED_OUTPATIENT_CLINIC_OR_DEPARTMENT_OTHER): Payer: Self-pay

## 2023-01-06 ENCOUNTER — Encounter (HOSPITAL_BASED_OUTPATIENT_CLINIC_OR_DEPARTMENT_OTHER): Payer: Self-pay

## 2023-01-06 DIAGNOSIS — I10 Essential (primary) hypertension: Secondary | ICD-10-CM

## 2023-01-06 DIAGNOSIS — K219 Gastro-esophageal reflux disease without esophagitis: Secondary | ICD-10-CM

## 2023-01-13 ENCOUNTER — Other Ambulatory Visit (LOCAL_COMMUNITY_HEALTH_CENTER): Payer: Self-pay | Admitting: Geri Psychiatry (PRV Practice 16)

## 2023-01-13 MED ORDER — DULOXETINE HCL 30 MG PO CPEP
30.0000 mg | ORAL_CAPSULE | Freq: Every day | ORAL | 2 refills | Status: DC
Start: 2023-03-16 — End: 2023-02-25

## 2023-01-13 MED ORDER — CLONAZEPAM 1 MG PO TABS
ORAL_TABLET | ORAL | 2 refills | Status: DC
Start: 2023-03-16 — End: 2023-09-03

## 2023-01-14 ENCOUNTER — Encounter (HOSPITAL_BASED_OUTPATIENT_CLINIC_OR_DEPARTMENT_OTHER): Payer: Self-pay

## 2023-01-14 DIAGNOSIS — K219 Gastro-esophageal reflux disease without esophagitis: Secondary | ICD-10-CM

## 2023-01-19 ENCOUNTER — Other Ambulatory Visit: Payer: Self-pay

## 2023-01-19 ENCOUNTER — Ambulatory Visit
Admission: RE | Admit: 2023-01-19 | Discharge: 2023-01-19 | Disposition: A | Discharge status: Home / Self Care | Payer: No Typology Code available for payment source

## 2023-01-19 ENCOUNTER — Ambulatory Visit (HOSPITAL_BASED_OUTPATIENT_CLINIC_OR_DEPARTMENT_OTHER)
Admission: RE | Admit: 2023-01-19 | Discharge: 2023-01-19 | Disposition: A | Discharge status: Home / Self Care | Payer: No Typology Code available for payment source | Source: Ambulatory Visit

## 2023-01-19 DIAGNOSIS — I701 Atherosclerosis of renal artery: Secondary | ICD-10-CM | POA: Diagnosis not present

## 2023-01-19 DIAGNOSIS — N1831 Chronic kidney disease, stage 3a: Secondary | ICD-10-CM

## 2023-01-19 DIAGNOSIS — R600 Localized edema: Secondary | ICD-10-CM | POA: Diagnosis not present

## 2023-01-19 DIAGNOSIS — N179 Acute kidney failure, unspecified: Secondary | ICD-10-CM | POA: Diagnosis not present

## 2023-01-19 LAB — ECHOCARDIOGRAM W/ DOPPLER: LVEF: 60 %

## 2023-01-28 ENCOUNTER — Ambulatory Visit: Payer: No Typology Code available for payment source | Attending: Psychiatry - General

## 2023-01-28 DIAGNOSIS — F4310 Post-traumatic stress disorder, unspecified: Secondary | ICD-10-CM | POA: Insufficient documentation

## 2023-01-28 DIAGNOSIS — F411 Generalized anxiety disorder: Secondary | ICD-10-CM | POA: Insufficient documentation

## 2023-01-28 NOTE — Progress Notes (Deleted)
ADULT OUTPATIENT PSYCHIATRY     THERAPY INITIAL NOTE TEMPLATE    BH TREATMENT TEAM:  [facility]    REFERRED BY:    Joella Prince, APRN  [address]  [place],  [place] [address]    REASON FOR REFERRAL:      CHIEF COMPLAINT:     HISTORY OF PRESENT ILLNESS:       CURRENT MEDICATIONS:    Current Outpatient Medications   Medication Sig    [START ON 03/16/2023] clonazePAM (KLONOPIN) 1 MG tablet 1 tablet two times daily, PRN    [START ON 03/16/2023] DULoxetine (CYMBALTA) 30 MG capsule Take 1 capsule by mouth in the morning.    atenolol (TENORMIN) 100 MG tablet Take 1 tablet by mouth nightly    levothyroxine (SYNTHROID) 50 MCG tablet Take 1 tablet by mouth every morning before breakfast    chlorthalidone (HYGROTEN) 25 MG tablet Take 1 tablet by mouth in the morning.    gabapentin (NEURONTIN) 100 MG capsule Take 1 capsule by mouth in the morning and 1 capsule at noon and 1 capsule before bedtime.    omeprazole (PRILOSEC) 20 MG capsule Take 1 capsule by mouth in the morning.    budesonide-formoterol (SYMBICORT) 160-4.5 MCG/ACT inhaler Inhale 2 puffs into the lungs in the morning and 2 puffs before bedtime. Use 1 puff prn during the day. Rinse mouth after use..    TruForm Stockings 10-23mmHg (COMPRESSION STOCKINGS) B/l compression stocking    amlodipine-valsartan (EXFORGE) 5-320 MG per tablet Take 1 tablet by mouth in the morning.    calcium citrate-citamin D (CALCIUM CITRATE +) 315-5 MG-MCG tablet Take 1 tablet by mouth in the morning and 1 tablet before bedtime.    latanoprost (XALATAN) 0.005 % ophthalmic solution Place 1 drop into both eyes nightly    cholecalciferol (VITAMIN D3) 2000 UNIT tablet Take 1 tablet by mouth in the morning and 1 tablet before bedtime.     No current facility-administered medications for this visit.         LANGUAGE OF CARE:    [place]      LANGUAGE NEEDS MET:    Face to Face Interpreter    CURRENT TREATMENT/CONTACT INFO FOR OTHER AGENCIES AND MENTAL HEALTH PROVIDERS  (if applicable):    Contact/Community Support    No documentation.                   MEDICAL HISTORY:  Past Medical History:  09/11/2018: Chronic kidney disease, stage III (moderate) (HCC)  08/14/2019: COVID-19      Comment:  Risk category:   high  Date of symptom onset: 08/12/19                 Tested? Positive  Risk factors: CKD, BMI > 30, HTN                 Clinical Course First date of symptoms:  08/12/19                 08/15/19: (DOI 3): Pt states she is feeling well. Slight                ST and sinus congestion. No cough, fever, SOB, DOE or                edema. F/U in 3 days.  08/18/2019: DOI 7- Feeling better                over all, stable vitals, CM f/u in 2 days.  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)  02/20/2021: Osteopenia  11/29/2017: Panic disorder  No date: Wears eyeglasses     PROBLEM LIST:  Patient Active Problem List:     Essential hypertension     Labyrinthitis     Panic disorder     Hyperlipidemia     Chronic cough     Mild episode of recurrent major depressive disorder (HCC)     Allergic rhinitis     Chronic pain of both shoulders     Stage 3a chronic kidney disease     Anxiety state     Palpitations     Psychophysiological insomnia     Secondary hyperparathyroidism (HCC)     Osteopenia     Carpal tunnel syndrome of right wrist     Fibromyalgia     Hair loss     Vitamin D deficiency     Obesity (BMI 30-39.9)     Bilateral renal artery stenosis (HCC)     DOE (dyspnea on exertion)     Recurrent epistaxis     Gastroesophageal reflux disease without esophagitis     Other specified hypothyroidism     Combined forms of age-related cataract of right eye     Preoperative examination     Cervical cancer screening     Prediabetes     Healthcare maintenance     Schistosomiasis     Intraoperative floppy iris syndrome (IFIS)     Combined forms of age-related cataract  of left eye     Post-viral cough syndrome     Varicose veins of calf     Chronic bilateral low back pain without sciatica     Morbid obesity with BMI 40.0-44.9, adult (HCC)      BIOLOGICAL FAMILY HISTORY/FAMILY CONSTELLATION:    No family history on file.       PAST PSYCHIATRIC HISTORY:   Past Psychiatric History:    No documentation.                     PSYCHOSOCIAL:   OP Psychosocial Review    No documentation.                   SUBSTANCE USE:     Substance Use    No documentation.                    COLUMBIA RISK ASSESSMENTS:     Suicide:   C-SSRS OP Triage Screener    No documentation.                  C-SSRS Risk Assessment    No documentation.                   VIOLENCE:    Violence/Abuse Risk    No documentation.                   Protective Factors:   Protective Factors    No documentation.                    RISK ASSESSMENTS:       Violence: {:10827}     Addiction: {:10827}     ADDITIONAL SCREENINGS:       11/06/2021  1:57 PM 11/06/2021     3:24 PM 12/03/2022     4:17 PM   PHQ9 SCREENING   Patient refused PHQ-9  No    Little interest or pleasure in doing things Not at all Several Days Nearly Every Day   Feeling down, depressed, or hopeless Not at all Several Days Nearly Every Day   Trouble falling asleep, staying asleep, or sleeping too much  Not at all Several Days   Feeling tired or having low energy  Several Days Several Days   Poor appetite or overeating  Not at all Not at all   Feeling bad about yourself - or that you are a failure or have let yourself or your family down  Not at all Not at all   Trouble concentrating on things, such as school work, reading the newspaper, or watching television  Not at all Not at all   Moving or speaking slowly that other people could have noticed.  Or the opposite - being so fidgety or restless that you have been  moving around a lot more than ususal  Not at all Not at all   Thoughts that you would be better off dead or of hurting yourself in some way  Not at all Not  at all   TOTAL  3 8   If you checked off any problems, how difficult have these problems made it for you to do your work, take care of things at home, or get along with people?  Not difficult at all          12/03/2022     4:17 PM 11/06/2021     3:24 PM 11/06/2021     1:57 PM   GAD-7 FLOWSHEET   Feeling Nervous/Anxious/ On Edge 3 1 1    Unable to Stop Worrying 3 0 0         12/03/2022     4:14 PM   SDOH LAST 3 VALUES   Patient is willing to answer these questions Yes   In the last 12 months, did you worry that your food would run out before you got money to buy more? Never   In the last 12 months, the food you bought didn't last and you didn't have money to get more Never   In the last 12 months, has the electric, gas or oil company threatened to shut off services in your home? No   In the last 12 months, have you or your family had trouble getting transportation to medical appointments? No   What is your living situation today? I have a steady place to live.   Do you have access to the internet at home? (Select all that apply) Yes, on a computer,Yes, on my phone   In the last 12 months, have you experienced violence at home or in your relationships? No   Would you like a [place] care team member to reach out to help you with the needs you checked off above? No   May we refer you to free or low cost community programs (like food pantries) by sharing your name, phone and address so they can reach you? No          MENTAL STATUS EXAMINATION:   Mental Status Exam    No documentation.                   BIO/PSYCHO/SOCIAL AND RISK FORMULATION(S):  ***    DIAGNOSES:    No diagnosis found.  PLAN: ***      CARE PLAN/ EPISODES:  No linked episodes    Patient/Guardian:       Strengths/Skills:      Potential Barriers:      Patient stated goals:      Problem 1:      Short Term Goals:    Short Term Target:     Short TermTarget Date:    Short Term Goal #1 Progress:       Long Term Goals:    Long Term Target:     Long TermTarget Date:       Long Term Goal #1 Progress:       Intervention #1:    Intervention Frequency:     Intervention Duration:     Intervention Responsibility:                                                                                                                                                                                           Dolores Lory, PhD
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
## 2023-01-29 ENCOUNTER — Encounter (HOSPITAL_BASED_OUTPATIENT_CLINIC_OR_DEPARTMENT_OTHER): Payer: Self-pay

## 2023-01-29 ENCOUNTER — Ambulatory Visit: Payer: No Typology Code available for payment source

## 2023-01-29 DIAGNOSIS — N1831 Chronic kidney disease, stage 3a: Secondary | ICD-10-CM | POA: Diagnosis present

## 2023-01-29 DIAGNOSIS — I5032 Chronic diastolic (congestive) heart failure: Secondary | ICD-10-CM | POA: Insufficient documentation

## 2023-01-29 DIAGNOSIS — I10 Essential (primary) hypertension: Secondary | ICD-10-CM | POA: Diagnosis present

## 2023-01-29 DIAGNOSIS — E782 Mixed hyperlipidemia: Secondary | ICD-10-CM | POA: Diagnosis present

## 2023-01-29 DIAGNOSIS — I701 Atherosclerosis of renal artery: Secondary | ICD-10-CM | POA: Insufficient documentation

## 2023-01-29 MED ORDER — ATORVASTATIN CALCIUM 80 MG PO TABS
80.0000 mg | ORAL_TABLET | Freq: Every day | ORAL | 3 refills | Status: DC
Start: 2023-01-29 — End: 2024-02-01

## 2023-01-29 MED ORDER — SPIRONOLACTONE 50 MG PO TABS
50.0000 mg | ORAL_TABLET | Freq: Every day | ORAL | 2 refills | Status: DC
Start: 2023-01-29 — End: 2023-12-10

## 2023-01-29 NOTE — Progress Notes (Signed)
HPI  Sara Snyder is a 67 year old female for televisit      Interview was in [place]  with external telephone interpreter     Contact Information  [phone] (Home Phone)    [phone] [facility]) Sara Snyder     Patient is reached at daughter Sara Snyder's number  They are both on the line  History from both       Social History    Social History Narrative      Originally from [place], [place]      History of childhood trauma      Lives with family      The combined HPI and assessment and plan are below; with any medical information and history updated and reviewed.     Physical Exam  Available vitals per patient: n/a    General: comfortable sounding   older,   No conversational dyspnea  Alert and oriented to person, place, and time  Appropriate mood, affect, tone and conversation     Assessment and Plan      Imaging results    Renal Artery Duplex SMA has evidence of elevated velocities indicating greater than 70% stenosis.   TTE LVEF 60% grade 2 diastolic dysfunction    Hypertension   CHF     Preoccupied with control hypertension w stopping atenolol  Discussed Atenolol has no CV benefit  And that we will tolerate a short period of poorer control and take some time to titrate the medication to start a medication that actually may help long term    - need statin The 10-year ASCVD risk score (Arnett DK, et al., 2019) is: 14.1% Add atorva 80  -Nurse visit 1 week phone or in person to f/u hypertension and if > 130/80 increase to 100 mg  and f/u BMP (ordered today)- come in at their convenience  And leg swelling- better/same/worse    After nurse visit  If med change, scheudle 7-10 f/u with PA Jeanell Sparrow, PA for next steps  If no med changes, 4 weeks with me in person     B/l renal stenosis  On ARB  Not sure if she would be a good candidate for perc angioplasty or stenting  Not on statin - start today  - Renal referral for chronic kidney disease MyChart sent to Ad[facility] to schedule      Challenging conversation as patient  is highly anxious at any mention of cardiovascular health  So no mention CHF but more about toll hypertension has taken on heart and cardiovascular system over time  Building trust around long term health and my balancing different concerns and health issues and being able to prioritize /triage  They ask great questions around            R. Delice Bison, MD  01/29/23 5:01 PM      Time spent was 44 minutes on the day of service on:  Preparing to see the patient (e.g., review of tests)  and/or Obtaining and/or reviewing separately obtained history  and/or Performing a medically appropriate examination and/or evaluation  and/or Counseling and educating the patient, family, or caregiver  and/or Ordering medications, tests, or procedures  and/or Referring and communicating with other health care professionals (when not separately reported)  and/orDocumenting clinical information in the electronic or other health record  and/or Independently interpreting results (not separately reported) and communicating results to the patient, family, or caregiver  and/or Care coordination (not separately reported)

## 2023-02-03 ENCOUNTER — Telehealth (HOSPITAL_BASED_OUTPATIENT_CLINIC_OR_DEPARTMENT_OTHER): Payer: Self-pay | Admitting: Registered Nurse

## 2023-02-03 NOTE — Telephone Encounter (Signed)
Spoke with patient using a telephone interpreter, ivan [phone]  Well she doesn't understand and I really need to talk with March Rummage and got her (out of the shower)  Relayed message and plan  Apologized for not reading mychart, will read and call to schedule  Verbalized back and agrees with plan

## 2023-02-03 NOTE — Telephone Encounter (Signed)
-----   Message from Freddie Breech sent at 02/03/2023  1:54 PM EDT -----  Nursing patient needs to make nephrology appointment  Referral placed  Daughter andreza hasn't read my message

## 2023-02-04 ENCOUNTER — Telehealth (HOSPITAL_BASED_OUTPATIENT_CLINIC_OR_DEPARTMENT_OTHER): Payer: Self-pay | Admitting: Registered Nurse

## 2023-02-04 NOTE — Telephone Encounter (Signed)
TC to pt  Spoke with daughter [name] and pt  No major change in leg swelling  Just started spironolactone two days ago  They have been checking home BPs  Pt checks BP 3x a day  She has an older wrist monitor but also has a newer arm one  I advised she use the arm one from now on and bring to visit next appt for validation  We reviewed proper BP technique (sit with arm rested, back supported, feet flat)    Pt read the following off the monitor:  8/15 - 129/71  Followed by the following readings but unable to tell date or time:  140/65, 117/80, 134/75, 135/68, 144/70, 106/56, 138/68, 143/74, 129/68, 154/75, 147/75, 114/64    Confirmed taking amlodipine-valsartan and chlorthalidone daily    Pt will leave 8/18 to [place] x 1 week returning 8/27    RN to inform provider    Dairl Ponder, RN, 02/04/2023 5:20 PM

## 2023-02-05 ENCOUNTER — Ambulatory Visit (HOSPITAL_BASED_OUTPATIENT_CLINIC_OR_DEPARTMENT_OTHER): Payer: No Typology Code available for payment source

## 2023-02-05 NOTE — Telephone Encounter (Signed)
Does not need to check BP 3x a day  Can take in AM  We said to try to start the Spironolactone for a week before checking BP    Anyway- keep taking Spironolactone, and f/u with Jeanell Sparrow, PA on their return in person (40 min appointment as her schedule allows)     Bring in BP machine at that time too and all meds    Thanks,    Dr. Yevette Edwards R. Delice Bison, MD  02/05/23 11:59 AM

## 2023-02-05 NOTE — Telephone Encounter (Signed)
Spoke with pt  Relayed recommendations and plan  Follow up scheduled with Duwayne Heck 8/28  Reminded to come with all meds and BP monitor  Verbalized understanding    Dairl Ponder, RN, 02/05/2023 3:23 PM

## 2023-02-08 ENCOUNTER — Ambulatory Visit (HOSPITAL_BASED_OUTPATIENT_CLINIC_OR_DEPARTMENT_OTHER): Payer: No Typology Code available for payment source | Admitting: Pulmonary

## 2023-02-09 ENCOUNTER — Ambulatory Visit (HOSPITAL_BASED_OUTPATIENT_CLINIC_OR_DEPARTMENT_OTHER): Payer: No Typology Code available for payment source

## 2023-02-10 ENCOUNTER — Encounter (HOSPITAL_BASED_OUTPATIENT_CLINIC_OR_DEPARTMENT_OTHER): Payer: Self-pay

## 2023-02-11 ENCOUNTER — Telehealth (HOSPITAL_BASED_OUTPATIENT_CLINIC_OR_DEPARTMENT_OTHER): Payer: Self-pay | Admitting: General Practice

## 2023-02-11 ENCOUNTER — Ambulatory Visit (HOSPITAL_BASED_OUTPATIENT_CLINIC_OR_DEPARTMENT_OTHER): Payer: Self-pay

## 2023-02-11 NOTE — Telephone Encounter (Signed)
Reason for Disposition  . [1] Systolic BP 90-110 AND [2] taking blood pressure medications AND [3] feeling weak or lightheaded    Answer Assessment - Initial Assessment Questions  Spoke with patients daughter Ronnald Ramp  Verified patients name and DOB with daughter, patient is not present doing the triage. Patient is currently in [place] with her daughter.     Patients daughter, reports to patient having low BP since starting spironolactone on 02/02/23. She reports to BP levels of:  100/55 this morning  100/44 3 pm today  84/55 yesterday   111/66  HR 108-124  Patient's daughter is using an automatic home BP monitor to check the patients blood pressure.   Per daughter, patient c/o weakness, pain to the back of her neck that comes and goes. Patient's daughter state's, she stopped given the patient chlorthalidone  25 MG tablet on 02/06/23.   Patient is also taking amlodipine-valsartan 5-320 MG tablet daily.  Patients daughter is requesting a call from PCP to discuss patients medication, she can re reached at  [phone].    RN advised patients daughter to bring the patient to her nearest hospital ER to be evaluated for her symptoms. Patient has a scheduled appointment next week with her PCP on 02/17/23.   Patient's daughter state's, she's not able to bring the patient to the ER in [place], her insurance will not cover the cost.   Encounter routed to PCP and nursing pool to advise.     Advised per nursing triage protocol.  Verbalized understanding and agreement with instructions and disposition.     Recommended disposition for patient:Disposition: Go to ED Now    If patient referred to UC/ED advised that they may require further follow up and testing after the visit with their primary care office.     Instructed patient to call back for any new, worsening, or worrisome symptoms or concerns any time day or night.    Protocols used: Blood Pressure - Low-A-AH

## 2023-02-11 NOTE — Telephone Encounter (Signed)
FW: Question  Received: [facility]  Phone Number: [phone]            Previous Messages    Question  (Newest Message First)  View All Conversations on this Encounter  You  Proxy for [name] (Sara Snyder)Just now (5:51 PM)     LC  Good evening Sara Snyder,  We have received your MyChart message regarding Sara Snyder  As you are currently traveling I would advise you to take Sara Snyder to the nearest hospital ED if she is experiencing rapid heart rate, chest pain or dizziness.   Please call and speak with a nurse regarding the symptoms reported via the MyChart message  We are available Mon - Fri from 8am until 5pm.  The phone number is:[phone]  Thanks and have a good night  Sara Fiscal, RN    This MyCHArt message has not been read.      Sara Snyder routed conversation to [facility]Yesterday (5:39 PM)     [name] (proxy for Sara Snyder)  P Ec Mychart MessagesYesterday (4:33 PM)     AA  Good afternoon, Doctor. I would like to ask you a question about my mother's blood pressure after starting the new medication. It has been very low, for example 9x5, 8x5, 10x6, etc., but what is bothering her the most, and I have also been most concerned, is that her heart rate only gets high. This has never happened before. It only stays between 107, 124, 130 beats. Is this because of the new medication? Because her heart rate has always been low, like 64, 75, 90. Even when her blood pressure was high, it did not change the heart rate like this. Is this dangerous? We are in [place] on vacation. So could you please answer this question for [name]? Thank you, Sara.

## 2023-02-11 NOTE — Telephone Encounter (Signed)
Interpreter:Christina  ID #: [phone]      Spoke with dtr,  Relayed results and plan   Dtr will hold spironolactone for now but will continue  amlodipine/valsartan and chlorthalidone.   Going to check BP every morning prior to givign medication  Also advised dtr to minimized pt's time outside in the hot sun. Have her spend most of her time under a shady and cool spot. Make sure she maintains well hydrated.  ED precautions given if sx    Dtr agreeable nad verbalized understanding    Paulino Rily, RN, 02/11/2023

## 2023-02-11 NOTE — Telephone Encounter (Signed)
[place] nursing- can you f/u with daughter [name]     - The spironolactone should not affect HR, [name] asks this in her MyChart)   They can stop it until they see Jeanell Sparrow, PA next week, but I do think it will be a better option for leg swelling. I wonder if they need to bring the machine to get validated too at that visit    - if SBP < 100 hold amlodipine-valsartan      I do agree if this is true hypotension, may be safest to evaluate in UC or ED to make sure there is not a cardiac event      Brookley Spitler R. Delice Bison, MD  02/11/23 4:42 PM

## 2023-02-12 NOTE — Telephone Encounter (Signed)
Janeece Agee, RN, 02/12/2023  Triage completed 08/22  Appt 08/28

## 2023-02-17 ENCOUNTER — Other Ambulatory Visit: Payer: Self-pay

## 2023-02-17 ENCOUNTER — Ambulatory Visit (HOSPITAL_BASED_OUTPATIENT_CLINIC_OR_DEPARTMENT_OTHER): Payer: No Typology Code available for payment source

## 2023-02-17 ENCOUNTER — Other Ambulatory Visit (HOSPITAL_BASED_OUTPATIENT_CLINIC_OR_DEPARTMENT_OTHER): Payer: Self-pay | Admitting: Registered Nurse

## 2023-02-17 ENCOUNTER — Ambulatory Visit: Payer: No Typology Code available for payment source | Admitting: Registered Nurse

## 2023-02-17 VITALS — BP 158/72 | HR 70

## 2023-02-17 DIAGNOSIS — I10 Essential (primary) hypertension: Secondary | ICD-10-CM

## 2023-02-17 NOTE — Progress Notes (Deleted)
Interpreter: A {DSLANGUAGE:20068} was used today.     SUBJECTIVE  Chief Complaint: BP and leg swelling f/u     History of Present Illness: Patient Lakaisha TROTMAN is a 67 year old female with PMHx of below who presents for:    # BP and leg swelling f/u   -     The current method of family planning is {contraception:5051}.      Review of Systems: Pertinent positives and negatives per HPI.    PMH:  Patient Active Problem List:     Essential hypertension     Labyrinthitis     Panic disorder     Hyperlipidemia     Chronic cough     Mild episode of recurrent major depressive disorder (HCC)     Allergic rhinitis     Chronic pain of both shoulders     Stage 3a chronic kidney disease     Anxiety state     Palpitations     Psychophysiological insomnia     Secondary hyperparathyroidism (HCC)     Osteopenia     Carpal tunnel syndrome of right wrist     Fibromyalgia     Hair loss     Vitamin D deficiency     Obesity (BMI 30-39.9)     Bilateral renal artery stenosis (HCC)     DOE (dyspnea on exertion)     Recurrent epistaxis     Gastroesophageal reflux disease without esophagitis     Other specified hypothyroidism     Combined forms of age-related cataract of right eye     Preoperative examination     Cervical cancer screening     Prediabetes     Healthcare maintenance     Schistosomiasis     Intraoperative floppy iris syndrome (IFIS)     Combined forms of age-related cataract of left eye     Post-viral cough syndrome     Varicose veins of calf     Chronic bilateral low back pain without sciatica     Morbid obesity with BMI 40.0-44.9, adult [facility])      Past Surgical History:  No date: CATARACT REMOVAL INSERTION OF LENS; Right  10/2016: HEMORRHOID SURGERY  No date: OB ANTEPARTUM CARE CESAREAN DLVR & POSTPARTUM    Social History    Tobacco Use      Smoking status: Former        Packs/day: 0.00        Types: Cigarettes        Quit date: 10/27/1977        Years since quitting: 45.3      Smokeless tobacco: Never    Alcohol use: Not  Currently      No family history on file.        Current Outpatient Medications:     spironolactone (ALDACTONE) 50 MG tablet, Take 1 tablet by mouth in the morning. STOP ATENOLOL, START THIS MEDICATION., Disp: 30 tablet, Rfl: 2    atorvastatin (LIPITOR) 80 MG tablet, Take 1 tablet by mouth in the morning., Disp: 90 tablet, Rfl: 3    [START ON 03/16/2023] clonazePAM (KLONOPIN) 1 MG tablet, 1 tablet two times daily, PRN, Disp: 60 tablet, Rfl: 2    [START ON 03/16/2023] DULoxetine (CYMBALTA) 30 MG capsule, Take 1 capsule by mouth in the morning., Disp: 30 capsule, Rfl: 2    levothyroxine (SYNTHROID) 50 MCG tablet, Take 1 tablet by mouth every morning before breakfast, Disp: 90 tablet, Rfl: 3    chlorthalidone (  HYGROTEN) 25 MG tablet, Take 1 tablet by mouth in the morning., Disp: 90 tablet, Rfl: 3    gabapentin (NEURONTIN) 100 MG capsule, Take 1 capsule by mouth in the morning and 1 capsule at noon and 1 capsule before bedtime., Disp: 90 capsule, Rfl: 2    omeprazole (PRILOSEC) 20 MG capsule, Take 1 capsule by mouth in the morning., Disp: 90 capsule, Rfl: 3    budesonide-formoterol (SYMBICORT) 160-4.5 MCG/ACT inhaler, Inhale 2 puffs into the lungs in the morning and 2 puffs before bedtime. Use 1 puff prn during the day. Rinse mouth after use.., Disp: 1 each, Rfl: 1    TruForm Stockings 10-59mmHg (COMPRESSION STOCKINGS), B/l compression stocking, Disp: 2 each, Rfl: 0    amlodipine-valsartan (EXFORGE) 5-320 MG per tablet, Take 1 tablet by mouth in the morning., Disp: 30 tablet, Rfl: 11    calcium citrate-citamin D (CALCIUM CITRATE +) 315-5 MG-MCG tablet, Take 1 tablet by mouth in the morning and 1 tablet before bedtime., Disp: 180 tablet, Rfl: 3    latanoprost (XALATAN) 0.005 % ophthalmic solution, Place 1 drop into both eyes nightly, Disp: 2.5 mL, Rfl: 12    cholecalciferol (VITAMIN D3) 2000 UNIT tablet, Take 1 tablet by mouth in the morning and 1 tablet before bedtime., Disp: 180 tablet, Rfl: 3    Review of Patient's  Allergies indicates:   Pollen extract          Cough    OBJECTIVE  There were no vitals taken for this visit.    Physical Exam:  Gen: Well-appearing, in no acute distress.    Skin: Warm, dry, no evident rashes or lesions.  HEENT: NCAT. EOMI, conjunctiva clear. Hearing intact to voice. Moist mucous membranes.  Neck: Full ROM.   Pulm: CTA bilaterally, no wheezes, no rales, no rhonchi. Not using accessory muscles of respiration.   CV: RRR, normal S1 and S2, no M/R/G.  Abd: Normoactive BS, soft, non-tender, non-distended. No rebound tenderness, no guarding. No Murphy's sign or McBurney point tenderness. No organomegaly. No masses.  MSK: Moving all 4 extremities appropriately.   Neuro: Awake, alert & oriented to person, place, and time. Gait and balance normal.   Psych: Euthymic.     ASSESSMENT/PLAN    ***diagmed      Follow-Up:     Precautions given, all questions answered.   Discussed risks/benefits, SEs of medications.     ***CC Chart: PCP, Raval, Pooja R, MD    {Document time spent to support E&M coding:12984}    Jeanell Sparrow, PA-C

## 2023-02-17 NOTE — Progress Notes (Signed)
Pt arrived for appt with Duwayne Heck today but due to mix up with registration was not seen   Both pt and daughter accompanying her very upset that pt was not seen by a provider   Reports arrived from [place] 8/26 and there had many episodes of increased HR: 107, 118, 124  Some days palpitation, vomiting, diarrhea  Last Sunday went to the beach and felt weak, like she would faint  Often BP in [place] were: 90/50, 88/44, 90/40   Pt stopped spironolactone 8/21 as feared it was causing the high HR  Some improvement in leg swelling, often worse at the end of the day  We reviewed meds taking daily:  Amlodipine-valsartan and Chlorthalidone   Pt has a wrist BP monitor about 69 years old  Following readings taken from monitor:  140/74 P92  137/66 P90  128/68 P90  119/57 P107  Advised will need new monitor and reminded to bring to appt once they get it for validation  Arm circumference: 37 cm  Pt also reported pain throughout the body, bilateral knee pain, shoulders, abdominal  Was told she has fibromyalgia and would like medication  RN paged Dr Delice Bison who reinforced the need to be seen and assessed by provider before any changes can be made  Appt scheduled 02/25/23  RN to follow up with a call by end's week    Dairl Ponder, RN, 02/17/2023

## 2023-02-18 MED ORDER — OTHER MEDICATION
0 refills | Status: DC
Start: 2023-02-18 — End: 2023-04-06

## 2023-02-19 ENCOUNTER — Telehealth (HOSPITAL_BASED_OUTPATIENT_CLINIC_OR_DEPARTMENT_OTHER): Payer: Self-pay | Admitting: Registered Nurse

## 2023-02-19 NOTE — Telephone Encounter (Signed)
Spoke with pt and daughter  Let her know needs in person evaluation before any med changes or recommendations  Verbalized understanding  Reminded them of the UC or ED for any urgency    RN to follow up with practice manager about the registration mix up when pt came to her appt this week    Dairl Ponder, RN, 02/19/2023 3:02 PM

## 2023-02-19 NOTE — Telephone Encounter (Signed)
She needs to be evaluated in person by a provider before starting an ew medication given the HYPOtension and leg swelling she as having last week    We will not be prescribing a medication prior to that, if there is urgency she can be booked for sooner, there are UC appts tomorrow.     I don't think this is indicated and again would ask that team look into why she wasn't able to be seen earlier this week as planned    Sara Nabor R. Delice Bison, MD  02/19/23 1:19 PM

## 2023-02-19 NOTE — Telephone Encounter (Signed)
TC to pt to check in   Spoke with daughter [name]  Pt did not feel well yesterday  Denies chest pain or SOB but just an overall feeling of unwell  When they checked her BP was 160/80  Took chlorthalidone and another clonazepam as feels anxious for the fact that she was not seen the day of appt  Pt is also very worried that she does not have another BP medication to replace spironolactone   At 11 pm reported stomach pain and BP was 170/90  Daughter decided to give her the atenolol 100 mg - leftover they have at home  Pt will be taking it nightly until appt as they think she does well with it  They now have a borrowed arm BP cuff - borrowed from pt's daughter and only [age] year old  She gave the following BP readings:  Last night 177/95 P 97  157/80 P 92  Then later 138/74 P 61  Leg swelling was worse at night  Today am: 142/81 P 69    Advised on reasons to take to the ED or UC  Reminded to keep upcoming appt 02/25/23  Plans to give Famotidine for now to help with the stomach pain     Daughter is still requesting that PCP give her a BP med before appt.  She is available over mychart if needed    RN to inform PCP    Dairl Ponder, RN, 02/19/2023 1:15 PM

## 2023-02-25 ENCOUNTER — Ambulatory Visit: Payer: No Typology Code available for payment source | Attending: Internal Medicine

## 2023-02-25 ENCOUNTER — Encounter (HOSPITAL_BASED_OUTPATIENT_CLINIC_OR_DEPARTMENT_OTHER): Payer: Self-pay

## 2023-02-25 ENCOUNTER — Other Ambulatory Visit: Payer: Self-pay

## 2023-02-25 VITALS — BP 143/76 | HR 75 | Temp 97.4°F | Wt 200.0 lb

## 2023-02-25 DIAGNOSIS — M797 Fibromyalgia: Secondary | ICD-10-CM | POA: Diagnosis present

## 2023-02-25 DIAGNOSIS — K219 Gastro-esophageal reflux disease without esophagitis: Secondary | ICD-10-CM | POA: Diagnosis present

## 2023-02-25 DIAGNOSIS — I10 Essential (primary) hypertension: Secondary | ICD-10-CM | POA: Insufficient documentation

## 2023-02-25 DIAGNOSIS — M7989 Other specified soft tissue disorders: Secondary | ICD-10-CM | POA: Diagnosis present

## 2023-02-25 MED ORDER — OMEPRAZOLE 40 MG PO CPDR
40.0000 mg | DELAYED_RELEASE_CAPSULE | Freq: Every day | ORAL | 0 refills | Status: DC
Start: 2023-02-25 — End: 2023-05-19

## 2023-02-25 MED ORDER — DULOXETINE HCL 60 MG PO CPEP
60.0000 mg | ORAL_CAPSULE | Freq: Every day | ORAL | 1 refills | Status: DC
Start: 2023-02-25 — End: 2023-08-30
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
## 2023-02-26 ENCOUNTER — Encounter (HOSPITAL_BASED_OUTPATIENT_CLINIC_OR_DEPARTMENT_OTHER): Payer: Self-pay

## 2023-03-03 ENCOUNTER — Ambulatory Visit (HOSPITAL_BASED_OUTPATIENT_CLINIC_OR_DEPARTMENT_OTHER): Payer: No Typology Code available for payment source | Admitting: Pulmonary

## 2023-03-12 ENCOUNTER — Ambulatory Visit
Admission: RE | Admit: 2023-03-12 | Discharge: 2023-03-12 | Disposition: A | Discharge status: Home / Self Care | Payer: No Typology Code available for payment source

## 2023-03-15 ENCOUNTER — Ambulatory Visit (HOSPITAL_BASED_OUTPATIENT_CLINIC_OR_DEPARTMENT_OTHER): Payer: No Typology Code available for payment source

## 2023-04-06 ENCOUNTER — Ambulatory Visit: Payer: No Typology Code available for payment source

## 2023-04-06 ENCOUNTER — Other Ambulatory Visit: Payer: Self-pay

## 2023-04-06 ENCOUNTER — Encounter (HOSPITAL_BASED_OUTPATIENT_CLINIC_OR_DEPARTMENT_OTHER): Payer: Self-pay

## 2023-04-06 ENCOUNTER — Telehealth (HOSPITAL_BASED_OUTPATIENT_CLINIC_OR_DEPARTMENT_OTHER): Payer: Self-pay

## 2023-04-06 VITALS — BP 151/85 | HR 87 | Temp 98.6°F | Ht 59.06 in | Wt 200.0 lb

## 2023-04-06 DIAGNOSIS — E559 Vitamin D deficiency, unspecified: Secondary | ICD-10-CM | POA: Diagnosis present

## 2023-04-06 DIAGNOSIS — I10 Essential (primary) hypertension: Secondary | ICD-10-CM

## 2023-04-06 DIAGNOSIS — I701 Atherosclerosis of renal artery: Secondary | ICD-10-CM | POA: Insufficient documentation

## 2023-04-06 DIAGNOSIS — F410 Panic disorder [episodic paroxysmal anxiety] without agoraphobia: Secondary | ICD-10-CM | POA: Insufficient documentation

## 2023-04-06 DIAGNOSIS — F419 Anxiety disorder, unspecified: Secondary | ICD-10-CM | POA: Diagnosis present

## 2023-04-06 DIAGNOSIS — N2581 Secondary hyperparathyroidism of renal origin: Secondary | ICD-10-CM | POA: Insufficient documentation

## 2023-04-06 DIAGNOSIS — Z23 Encounter for immunization: Secondary | ICD-10-CM | POA: Insufficient documentation

## 2023-04-06 MED ORDER — OTHER MEDICATION
1.0000 | Freq: Every day | 0 refills | Status: DC
Start: 2023-04-06 — End: 2024-04-25

## 2023-04-06 MED ORDER — CALCIUM CITRATE-VITAMIN D 315-5 MG-MCG PO TABS: 1.0000 | ORAL_TABLET | Freq: Two times a day (BID) | ORAL | 3 refills | Status: DC

## 2023-04-06 MED ORDER — BLOOD PRESSURE KIT
1.0000 | PACK | Freq: Every day | 0 refills | Status: AC
Start: 2023-04-06 — End: 2024-04-05

## 2023-04-06 MED ORDER — VITAMIN D 50 MCG (2000 UT) PO TABS
1.0000 | ORAL_TABLET | Freq: Two times a day (BID) | ORAL | 3 refills | Status: AC
Start: 2023-04-06 — End: 2024-04-05

## 2023-04-06 NOTE — Progress Notes (Signed)
HPI  Sara Snyder is a 67 year old female for scheduled appointment    Interview was in Port[place]uese  within person interpreter Sara Snyder  Her husband is here as well   Scheduled follow up  Reviewed many interval events- f/u with Jeanell Sparrow, PA, nursing visits/calls     Social History    Social History Narrative      Originally from [place], [place]      History of childhood trauma      Lives with family      The combined HPI and assessment and plan are below; with any medical information and history updated and reviewed.     Physical Exam  BP (!) 151/85   Pulse 87   Temp 98.6 F (37 C) (Temporal)   Ht 4' 11.06" (1.5 m)   Wt 90.7 kg (200 lb)   SpO2 97%   BMI 40.32 kg/m   Pain Score: Data Unavailable      Comfortable appearing, put together older  woman,   EOMI   Alert and oriented to person, place, and time  Appropriate mood, affect, tone.Sara Jefferson eye contact, pleasant     Assessment and Plan    Anxiety  Very anxious  Picking at cuticles, bleeding  Still thinking of panic attack from [place] a few months ago in August- recunts it several times in detail  Missed last pharmacotherapy appointment  And hasn't had therapist since hers left  [facility]) warm hand off  Reviewed role of controlling mood and hypertension   - rescheduled therapy and psychiatry appts       Hypertension  She's focused on atenolol which I discontinued   D/c and reviewed there is no cardiovascular benefit  She is on Chlorthalidone, Amlodipine, Valsartan and Spironolactone  She has a wrist cuff - ordered new kt with arm cuff,   RN measured she is okay for regular cuff size       Bilateral renal artery stenosis (HCC)     Overview     Noted 2022    2024 Renal dopplers-  The renal artery has evidence of 60-99% stenosis. RI values are elevated indicating intrinsic disease. Kidney length is normal. Renal vein is patent. The renal artery has evidence of 60-99% stenosis. RI values are elevated indicating intrinsic  disease. Kidney length is normal. Renal vein is patent. SMA has evidence of elevated velocities indicating greater than 70% stenosis.         Relevant Medications    OTHER MEDICATION        Secondary hyperparathyroidism (HCC)     Overview     Likely 2/2 CKD + Vit d deficiency  calcium supplements calcium tablets 600 mg p.o. twice daily if not adecquate dietary calcium intake  Monitor DEXA.           Relevant Medications    cholecalciferol (VITAMIN D3) 2000 UNIT tablet    calcium citrate-citamin D (CALCIUM CITRATE +) 315-5 MG-MCG tablet    Vitamin D deficiency  Vitamin D low-  supplemented  Reviewed important for bone health. Counseled to not take more than prescribed given risk of toxicity        Relevant Medications    cholecalciferol (VITAMIN D3) 2000 UNIT tablet    calcium citrate-citamin D (CALCIUM CITRATE +) 315-5 MG-MCG tablet     Flu shot given today    Follow up RN visit 3 weeks, with BP log   2 months [name], PA to f/u  on Renal recs  4 months me     Darrin Koman R. Delice Bison, MD  04/06/23 10:31 AM      Time spent was 49 minutes on the day of service on:  Preparing to see the patient (e.g., review of tests)  and/or Obtaining and/or reviewing separately obtained history  and/or Performing a medically appropriate examination and/or evaluation  and/or Counseling and educating the patient, family, or caregiver  and/or Ordering medications, tests, or procedures  and/or Referring and communicating with other health care professionals (when not separately reported)  and/orDocumenting clinical information in the electronic or other health record  and/or Independently interpreting results (not separately reported) and communicating results to the patient, family, or caregiver  and/or Care coordination (not separately reported)

## 2023-04-06 NOTE — Progress Notes (Signed)
Care Partner Note    Type of encounter:  Warm Handoff    Patient was referred to care partner through:  Warm hand-off from PCP or PA    Reason for encounter: Anxiety and Psych appointments    Assessment:   Patient missed psychiatrist appointment on 03/15/23.  Needs assistance to reschedule it.  Also, needs to reschedule psychotherapy. Last appointment in 01/28/23.    I called Intake Department.   Psychiatrist scheduled for 06/07/23 at 4pm - televideo  Psychotherapist scheduled for 04/23/23 at 2pm - televideo     Patient having due to concerns with his son in [place] that did a heart surgery.     Sleep - still an issue. She plays games on her cell phone from 12 am to 5 am.   Sleeps between 6am to 11 am.  In the afternoon watches TV and takes a nap.   Worried about her weight. Has pain on her hips.   Does not exercise. Says even walking had becoming difficult for her.     Discussed sleep hygiene.   Discussed about relaxation techniques.   Practiced some breathing exercises during the visit.  Reinforced physical activities. Discussed the benefits of exercises to control weight/ sleep better and reduce anxiety.     Appears to be a Step 3 patient on the Stepped Model of Care    Patient Active Problem List:     Essential hypertension     Labyrinthitis     Panic disorder     Hyperlipidemia     Mild episode of recurrent major depressive disorder (HCC)     Allergic rhinitis     Chronic pain of both shoulders     Stage 3a chronic kidney disease     Anxiety state     Psychophysiological insomnia     Secondary hyperparathyroidism (HCC)     Osteopenia     Carpal tunnel syndrome of right wrist     Fibromyalgia     Hair loss     Vitamin D deficiency     Obesity (BMI 30-39.9)     Bilateral renal artery stenosis (HCC)     DOE (dyspnea on exertion)     Recurrent epistaxis     Gastroesophageal reflux disease without esophagitis     Other specified hypothyroidism     Combined forms of age-related cataract of right eye     Cervical  cancer screening     Prediabetes     Healthcare maintenance     Schistosomiasis     Intraoperative floppy iris syndrome (IFIS)     Combined forms of age-related cataract of left eye     Varicose veins of calf     Chronic bilateral low back pain without sciatica     Morbid obesity with BMI 40.0-44.9, adult (HCC)      Medication: cholecalciferol (VITAMIN D3) 2000 UNIT tablet, Take 1 tablet by mouth in the morning and 1 tablet before bedtime., Disp: 180 tablet, Rfl: 3  calcium citrate-citamin D (CALCIUM CITRATE +) 315-5 MG-MCG tablet, Take 1 tablet by mouth in the morning and 1 tablet before bedtime., Disp: 180 tablet, Rfl: 3  OTHER MEDICATION, Use 1 each As Directed daily Blood Pressure Monitoring Kit   Arm Circumference LARGE  For use as directed: "Please Dispense AMA validated cuff see [email or web address] (such as Omron)"       DX: hypertension  ICD10: I10, Disp: 1 each, Rfl: 0  DULoxetine (CYMBALTA) 60 MG capsule, Take 1 capsule by mouth  in the morning., Disp: 90 capsule, Rfl: 1  omeprazole (PRILOSEC) 40 MG capsule, Take 1 capsule by mouth in the morning., Disp: 90 capsule, Rfl: 0  [DISCONTINUED] OTHER MEDICATION, Use 1 each As Directed in the morning. Blood Pressure Monitoring Kit   Arm Circumference regular   For use as directed: "Please Dispense AMA validated cuff see [email or web address] (such as Omron)"   DX: hypertension   ICD10: I10., Disp: 1 Units, Rfl: 0  spironolactone (ALDACTONE) 50 MG tablet, Take 1 tablet by mouth in the morning. STOP ATENOLOL, START THIS MEDICATION., Disp: 30 tablet, Rfl: 2  atorvastatin (LIPITOR) 80 MG tablet, Take 1 tablet by mouth in the morning., Disp: 90 tablet, Rfl: 3  clonazePAM (KLONOPIN) 1 MG tablet, 1 tablet two times daily, PRN, Disp: 60 tablet, Rfl: 2  levothyroxine (SYNTHROID) 50 MCG tablet, Take 1 tablet by mouth every morning before breakfast, Disp: 90 tablet, Rfl: 3  chlorthalidone (HYGROTEN) 25 MG tablet, Take 1 tablet by mouth in the morning., Disp: 90 tablet, Rfl:  3  gabapentin (NEURONTIN) 100 MG capsule, Take 1 capsule by mouth in the morning and 1 capsule at noon and 1 capsule before bedtime., Disp: 90 capsule, Rfl: 2  budesonide-formoterol (SYMBICORT) 160-4.5 MCG/ACT inhaler, Inhale 2 puffs into the lungs in the morning and 2 puffs before bedtime. Use 1 puff prn during the day. Rinse mouth after use.., Disp: 1 each, Rfl: 1  TruForm Stockings 10-90mmHg (COMPRESSION STOCKINGS), B/l compression stocking, Disp: 2 each, Rfl: 0  amlodipine-valsartan (EXFORGE) 5-320 MG per tablet, Take 1 tablet by mouth in the morning., Disp: 30 tablet, Rfl: 11  [DISCONTINUED] calcium citrate-citamin D (CALCIUM CITRATE +) 315-5 MG-MCG tablet, Take 1 tablet by mouth in the morning and 1 tablet before bedtime., Disp: 180 tablet, Rfl: 3  latanoprost (XALATAN) 0.005 % ophthalmic solution, Place 1 drop into both eyes nightly, Disp: 2.5 mL, Rfl: 12  [DISCONTINUED] cholecalciferol (VITAMIN D3) 2000 UNIT tablet, Take 1 tablet by mouth in the morning and 1 tablet before bedtime., Disp: 180 tablet, Rfl: 3    No current facility-administered medications on file prior to visit.      Relevant screening scores:  AWQ:   PHQ-9:       12/03/2022     4:17 PM 11/06/2021     3:24 PM 01/14/2021     9:20 AM   PHQ-9 TOTAL SCORE   Doc FlowSheet Total Score 8 3 11    MyChart Total Score 8       GAD-7:       02/16/2019    10:36 AM 11/17/2017    12:15 PM   GAD-7 Total   GAD-7 Score 11 21     Child/Adolescent:    How difficult have these problems made it for you to do your work, take care of things at home, or get along with people?  Somewhat difficult     Engagement/motivation for change (Reference)    Contemplation    Primary brief Intervention provided during this encounter:  Psychoeducation, Emotional Support, Teach and practice Mindfulness/Relaxation Exercise, and Care Coordination [place] [facility]  - Reviewed sleep hygiene  - Practiced breathing exercises  - Rescheduled psych appointments  - Encouraged physical activities    PLAN:    - Follow up with psychotherapist on 04/23/23  - Follow up with psychiatrist on 06/07/23  - Practice the breathing exercises discussed   - Increase physical activities in her daily routine    TIME SPENT: 10-20 minutes     Bobbe Medico,  04/06/2023

## 2023-04-06 NOTE — Telephone Encounter (Signed)
Hello,    RX received for BP kit, however patient insurance doesn't pay for this supply through DME (medical insurance). We would be able to fill RX in one of the [facility] pharmacies but the maximum size we have available for the cuff is the REGULAR size ( up to 17 inches).      Please advise and if appropriate send RX for this size or advise pt to purchase OTC

## 2023-04-06 NOTE — Telephone Encounter (Signed)
Spoke with daughter Sara Snyder using a telephone interpreter, jorge [phone]   Arm circumference: 37 cm (14.5inches)  Nurse sandra measured in aug when they were here in aug  RN to notify provider

## 2023-04-06 NOTE — Progress Notes (Signed)
Influenza Vaccine Procedure  April 06, 2023    1. Has the patient received the information for the influenza vaccine? Yes    2. Does the patient have any of the following contraindications?  Allergy to eggs? No  Allergic reaction to previous influenza vaccines? No  Any other problems to previous influenza vaccines? No  Paralyzed by Guillain-Barre syndrome?  No  Current moderate or severe illness? No    3. The vaccine has been administered in the usual fashion.     Immunization information reviewed. Current VIS reviewed and given to patient/ guardian. Verbal assent obtained from patient/ guardian.  See immunization/Injection module or chart review for date of publication and additional information. Verbal assent obtained from patient/guardian. Comfort measures for possible side effects reviewed.

## 2023-04-06 NOTE — Telephone Encounter (Signed)
Spoke with Lowella Bandy  Regular BP cuff is ready for pick up  RN to notify family    Spoke with [name] again  Let her know BP cuff is ready for pick up  She will come by tomorrow to get it  Verbalized back and agrees with plan

## 2023-04-06 NOTE — Telephone Encounter (Signed)
Regular cuff prescription  [facility] please f/u with nursing appointment for cuff fit assessment     Roczen Waymire R. Delice Bison, MD  04/06/23 1:02 PM

## 2023-04-07 ENCOUNTER — Ambulatory Visit
Admission: RE | Admit: 2023-04-07 | Discharge: 2023-04-07 | Disposition: A | Discharge status: Home / Self Care | Payer: No Typology Code available for payment source | Source: Ambulatory Visit

## 2023-04-07 ENCOUNTER — Encounter (HOSPITAL_BASED_OUTPATIENT_CLINIC_OR_DEPARTMENT_OTHER): Payer: Self-pay

## 2023-04-07 ENCOUNTER — Ambulatory Visit: Payer: No Typology Code available for payment source

## 2023-04-07 VITALS — BP 142/75 | HR 111 | Temp 97.0°F | Wt 202.0 lb

## 2023-04-07 DIAGNOSIS — I701 Atherosclerosis of renal artery: Secondary | ICD-10-CM | POA: Diagnosis not present

## 2023-04-07 DIAGNOSIS — N2581 Secondary hyperparathyroidism of renal origin: Secondary | ICD-10-CM

## 2023-04-07 DIAGNOSIS — D649 Anemia, unspecified: Secondary | ICD-10-CM | POA: Diagnosis not present

## 2023-04-07 DIAGNOSIS — N1831 Chronic kidney disease, stage 3a: Secondary | ICD-10-CM | POA: Insufficient documentation

## 2023-04-07 DIAGNOSIS — I150 Renovascular hypertension: Secondary | ICD-10-CM

## 2023-04-07 LAB — URINALYSIS ADVANTUS
BILIRUBIN, URINE: NEGATIVE
GLUCOSE, URINE: NEGATIVE mg/dL
KETONE, URINE: NEGATIVE mg/dL
LEUKOCYTE ESTERASE: NEGATIVE
NITRITE, URINE: NEGATIVE
OCCULT BLOOD, URINE: NEGATIVE
PH URINE: 6 (ref 5.0–8.0)
PROTEIN, URINE: NEGATIVE mg/dL
SPECIFIC GRAVITY URINE: 1.02 (ref 1.003–1.035)

## 2023-04-07 LAB — POC URINALYSIS
BILIRUBIN, URINE: NEGATIVE
GLUCOSE,URINE: NEGATIVE
KETONE, URINE: NEGATIVE
LEUKOCYTE ESTERASE: NEGATIVE
NITRITE, URINE: NEGATIVE
OCCULT BLOOD, URINE: NEGATIVE
PH URINE: 6.5 (ref 5.0–8.0)
PROTEIN, URINE: NEGATIVE
SPECIFIC GRAVITY, URINE: 1.02 (ref 1.003–1.030)
UROBILINOGEN URINE: 0.2 (ref 0.2–1.0)

## 2023-04-07 LAB — CBC WITH PLATELET
ABSOLUTE NRBC COUNT: 0 10*3/uL (ref 0.0–0.0)
HEMATOCRIT: 34.3 % (ref 34.1–44.9)
HEMOGLOBIN: 10.8 g/dL — ABNORMAL LOW (ref 11.2–15.7)
MEAN CORP HGB CONC: 31.5 g/dL (ref 31.0–37.0)
MEAN CORPUSCULAR HGB: 26.8 pg (ref 26.0–34.0)
MEAN CORPUSCULAR VOL: 85.1 fL (ref 80.0–100.0)
MEAN PLATELET VOLUME: 10.8 fL (ref 8.7–12.5)
NRBC %: 0 % (ref 0.0–0.0)
PLATELET COUNT: 297 10*3/uL (ref 150–400)
RBC DISTRIBUTION WIDTH STD DEV: 42.3 fL (ref 35.1–46.3)
RED BLOOD CELL COUNT: 4.03 M/uL (ref 3.90–5.20)
WHITE BLOOD CELL COUNT: 6.7 10*3/uL (ref 4.0–11.0)

## 2023-04-07 LAB — MICROALBUMIN RANDOM URINE
ALB/CREAT RATIO URINE RAN: 37 ug/mg — ABNORMAL HIGH (ref 0–30)
ALBUMIN URINE RANDOM: 3.8 mg/dL — ABNORMAL HIGH (ref 0.0–1.9)

## 2023-04-07 LAB — IRON: IRON: 42 ug/dL — ABNORMAL LOW (ref 50–170)

## 2023-04-07 LAB — PROTEIN/CREAT URINE RANDOM
CREATININE RANDOM URINE: 104 mg/dL (ref 28–217)
PROT/CREAT RATIO URINE RANDOM: 0.13 mg/mg{creat} (ref 0.00–0.15)
TOTAL PROTEIN RANDOM URINE: 13 mg/dL (ref 0–15)

## 2023-04-07 LAB — BASIC METABOLIC PANEL
ANION GAP: 11 mmol/L (ref 10–22)
BUN (UREA NITROGEN): 23 mg/dL — ABNORMAL HIGH (ref 7–18)
CALCIUM: 9.5 mg/dL (ref 8.5–10.5)
CARBON DIOXIDE: 30 mmol/L (ref 21–32)
CHLORIDE: 97 mmol/L — ABNORMAL LOW (ref 98–107)
CREATININE: 1.3 mg/dL — ABNORMAL HIGH (ref 0.4–1.2)
ESTIMATED GLOMERULAR FILT RATE: 45 mL/min — ABNORMAL LOW (ref >60)
Glucose Random: 100 mg/dL (ref 74–160)
POTASSIUM: 3.8 mmol/L (ref 3.5–5.1)
SODIUM: 138 mmol/L (ref 136–145)

## 2023-04-07 LAB — PTH INTACT WITH CALCIUM
PTH INTACT CALCIUM: 9.5 mg/dL (ref 8.5–10.5)
PTH INTACT: 102 pg/mL — ABNORMAL HIGH (ref 15–65)

## 2023-04-07 LAB — CYSTATIN C
CYSTATIN C GFR: 39 mL/min — ABNORMAL LOW (ref >60)
CYSTATIN C: 1.55 mg/L — ABNORMAL HIGH (ref 0.62–1.16)

## 2023-04-07 LAB — ALBUMIN: ALBUMIN: 4.2 g/dL (ref 3.4–5.2)

## 2023-04-07 LAB — PHOSPHORUS: PHOSPHORUS: 3.4 mg/dL (ref 2.5–4.9)

## 2023-04-07 LAB — FERRITIN: FERRITIN: 13 ng/mL (ref 13–150)

## 2023-04-07 LAB — TOTAL IRON BINDING CAPACITY: TOTAL IRON BIND CAPACITY CALC: 438 ug/dL (ref 280–504)

## 2023-04-07 LAB — MAGNESIUM: MAGNESIUM: 1.7 mg/dL (ref 1.6–2.6)

## 2023-04-07 LAB — VITAMIN D,25 HYDROXY: VITAMIN D,25 HYDROXY: 30 ng/mL (ref 30.0–100.0)

## 2023-04-07 MED ORDER — MECLIZINE HCL 25 MG PO TABS
25.0000 mg | ORAL_TABLET | Freq: Three times a day (TID) | ORAL | 5 refills | Status: DC | PRN
Start: 2023-04-07 — End: 2023-08-25

## 2023-04-07 NOTE — Progress Notes (Signed)
Nephrology and Hypertension Consult Note  Requested by Freddie Breech, MD    Thank you very much for consulting me regarding Sara Snyder regarding her CKD, bilateral renal artery stenosis.  To summarize her history, which was obtained from the patient, as well as review of the medical records, Sara Snyder  is a 67 year old female with a PMHx of bilateral renal artery stenosis, hypertension, hyperlipidemia, GERD, fibromyalgia.    Baseline creatinine: 1.1-1.3    Patient was first diagnosed with HTN: age 38. Does check her blood pressure at home. Average readings: Variable.  SBP ranges anywhere from 80-1 50s. Current BP meds: Chlorthalidone 25 mg daily, amlodipine-valsartan 5-320 mg once daily.  She had previously been on spironolactone, however this was discontinued due to low blood pressures and elevated heart rate.    Seen by Dr. Wellington Hampshire, vascular surgery, on 06/24/20:  "Comment: Reviewing her duplex imaging studies, she appears to have severe parenchymal disease that would not be improved with renal artery revascularization.  Plan: Based on the CORAL and ASTRAL trials, this medical management is paramount for her as revascularization would only run the risks of the procedure with no added benefit.  Considering her renal function is relatively normal, further intervention would not be deemed necessary.  However, if her renal function were to deteriorate with significant hypertension, then a last resort procedure may be in order."    Denies any recent changes in meds. Denies any OTC medications. Denies herbals/supplements. Denies any changes in diet.   Denies CP/SOB. Denies N/V/D. Denies pruritis. Denies any changes in urination (vol), any issues urinating. Denies foamy appearance to urine. Denies bloody-/tea-colored urine. No dysuria.  Reports pain in the hips that makes it difficult to walk around.    PMH:  Her past history is significant for  has a past medical history of Chronic kidney disease, stage III (moderate) (HCC)  (09/11/2018), COVID-19 (08/14/2019), History of hypertension, HTN (hypertension), Mild episode of recurrent major depressive disorder (HCC) (01/05/2018), Osteopenia (02/20/2021), Panic disorder (11/29/2017), and Wears eyeglasses.    She has no past medical history of Adverse effect of anesthesia, Anemia, Arthritis, Asthma, Astigmatism, Back pain, CAD (coronary artery disease), Cancer (HCC), Complete immobility due to severe physical disability or frailty (HCC), Diabetes mellitus (HCC), Heart failure (HCC), Liver disease, Lung disease, Malignant hyperthermia, MI (myocardial infarction) (HCC), Migraine, PONV (postoperative nausea and vomiting), Sinusitis, Sleep apnea, Stomach disease, or Substance abuse (HCC).    Medications:   Current Outpatient Medications   Medication Instructions    amlodipine-valsartan (EXFORGE) 5-320 MG per tablet 1 tablet, Oral, DAILY    atorvastatin (LIPITOR) 80 mg, Oral, DAILY    Blood Pressure KIT 1 kit, Does not apply, DAILY BEFORE BREAKFAST, Regular ICD i10    budesonide-formoterol (SYMBICORT) 160-4.5 MCG/ACT inhaler 2 puffs, Inhalation, 2 TIMES DAILY, Use 1 puff prn during the day. Rinse mouth after use.    calcium citrate-citamin D (CALCIUM CITRATE +) 315-5 MG-MCG tablet 1 tablet, Oral, 2 TIMES DAILY    chlorthalidone (HYGROTEN) 25 mg, Oral, DAILY    cholecalciferol (VITAMIN D3) 2,000 Units, Oral, 2 TIMES DAILY    clonazePAM (KLONOPIN) 1 MG tablet 1 tablet two times daily, PRN    DULoxetine (CYMBALTA) 60 mg, Oral, DAILY    gabapentin (NEURONTIN) 100 mg, Oral, 3 TIMES DAILY    latanoprost (XALATAN) 0.005 % ophthalmic solution 1 drop, Both Eyes, NIGHTLY    levothyroxine (SYNTHROID) 50 mcg, Oral, EVERY MORNING BEFORE BREAKFAST    meclizine (ANTIVERT) 25 mg, Oral, EVERY  8 HOURS PRN    omeprazole (PRILOSEC) 40 mg, Oral, DAILY    OTHER MEDICATION 1 each, Does not apply, DAILY, Blood Pressure Monitoring Kit <BR>Arm Circumference LARGE<BR>For use as directed: "Please Dispense AMA validated cuff  see [email or web address] (such as Omron)" <BR><BR><BR>DX: hypertension<BR>ICD10: I10    spironolactone (ALDACTONE) 50 mg, Oral, DAILY, STOP ATENOLOL, START THIS MEDICATION    TruForm Stockings 10-50mmHg (COMPRESSION STOCKINGS) B/l compression stocking        Allergies:Pollen Extract  Social:  reports that she quit smoking about 45 years ago. Her smoking use included cigarettes. She has never used smokeless tobacco. She reports that she does not currently use alcohol. She reports that she does not use drugs.  Family History was negative for renal disease  ROS:  As per HPI. All other systems reviewed are negative.    Vitals: BP 142/75 (Site: RA, Position: Sitting, Cuff Size: Reg)   Pulse 111   Temp 97 F (36.1 C) (Temporal)   Wt 91.6 kg (202 lb)   SpO2 97%   BMI 40.72 kg/m   Constitutional: No acute distress. Conversant.  Psych: Mood and affect are appropriate. Judgement and insight seem appropriate as well.  Eyes: Anicteric sclerae. No conjunctival pallor. No lid-lag. Pupils are equal and round  Lungs: Clear to auscultation bilaterally. Normal respiratory effort with no intercostal retractions.  CV: Normal S1 S2. Heart rate and rhythm regular.  Extremities: Trace peripheral edema.  Skin: No rashes, ulcers or subcutaneous nodules. Normal temperature and texture.     Labs:     Lab Results   Component Value Date    NA 138 04/07/2023    K 3.8 04/07/2023    CO2 30 04/07/2023    CREAT 1.3 (H) 04/07/2023    GFR 45 (L) 04/07/2023       Lab Results   Component Value Date    PHOS 3.4 04/07/2023    CA 9.5 04/07/2023    PTHINTACTWC 102 (H) 04/07/2023    VITD25H 30 04/07/2023        Lab Results   Component Value Date    HGB 10.8 (L) 04/07/2023        ALB/CREAT RATIO URINE RAN (ug/mg)   Date Value   04/07/2023 37 (H)   12/03/2022 35 (H)   12/22/2021 23       Duplex Renal Arteries- 01/19/23  Conclusions:  Right - The renal artery has evidence of 60-99% stenosis. RI values are elevated indicating intrinsic disease. Kidney length  is normal. Renal vein is patent.  Left - The renal artery has evidence of 60-99% stenosis. RI values are elevated indicating intrinsic disease. Kidney length is normal. Renal vein is patent.  Note: SMA has evidence of elevated velocities indicating greater than 70% stenosis.    Assessment and Plan    Proteinuric CKD IIIa: Likely due to longstanding difficult to control hypertension in the setting of bilateral renal artery stenosis.  We discussed the importance of good blood pressure control to prevent the progression of CKD.  Recommend low-salt diet, less than 2 g/day.  Continue ARB for antiproteinuric effect.  Given continued leg swelling will start torsemide 5 mg daily and recheck labs in 4 weeks.     #BP/Volume management:  142/75  above goal on current regimen.  Difficult to control as blood pressure is wildly variable.  Renal function is relatively stable  -Advised pt to check BPs at home and keep a log.   -Goal: < 130/80  -Continue chlorthalidone 25 mg daily,  amlodipine-valsartan 5-320 mg once daily.  -Continue holding spironolactone for now  -Will start torsemide 5 mg daily as above.    #Anemia of CKD:   -Hgb 10.8  -Tsat 9.6%, Ferritin 13  -Will start ferrous sulfate 325 mg every other day.    #Mineral Bone Disease: Ca, Phos, VitD in good order.  PTH elevated, likely in the setting of secondary hyperparathyroidism  - Continue calcium, vitamin D supplement    #Electrolytes/Acid-base:  Lytes in good order.      Patient to return in: 4 weeks, sooner if needed.    No orders of the defined types were placed in this encounter.    There are no Patient Instructions on file for this visit.      Thank you for involving me in the care of this patient. Please do not hesitate to reach out with any questions.    Ilona Sorrel, DO    Pager ID: [phone]    [facility]  [address]  [place], [place] [address]  [phone]

## 2023-04-12 ENCOUNTER — Other Ambulatory Visit (HOSPITAL_BASED_OUTPATIENT_CLINIC_OR_DEPARTMENT_OTHER): Payer: Self-pay

## 2023-04-12 ENCOUNTER — Telehealth (HOSPITAL_BASED_OUTPATIENT_CLINIC_OR_DEPARTMENT_OTHER): Payer: Self-pay

## 2023-04-12 ENCOUNTER — Encounter (HOSPITAL_BASED_OUTPATIENT_CLINIC_OR_DEPARTMENT_OTHER): Payer: Self-pay

## 2023-04-12 DIAGNOSIS — N1831 Chronic kidney disease, stage 3a: Secondary | ICD-10-CM

## 2023-04-12 MED ORDER — FERROUS SULFATE 325 (65 FE) MG PO TABS
325.0000 mg | ORAL_TABLET | ORAL | 11 refills | Status: DC
Start: 2023-04-12 — End: 2023-11-01

## 2023-04-12 MED ORDER — TORSEMIDE 5 MG PO TABS
5.0000 mg | ORAL_TABLET | Freq: Every day | ORAL | 0 refills | Status: DC
Start: 2023-04-12 — End: 2023-05-19

## 2023-04-12 MED ORDER — TORSEMIDE 5 MG PO TABS
100.0000 mg | ORAL_TABLET | Freq: Every day | ORAL | 0 refills | Status: DC
Start: 2023-04-12 — End: 2023-04-12

## 2023-04-12 NOTE — Telephone Encounter (Signed)
Called pt-no answer. LVM to call office back.    Letter sent.        [name], MD  [facility]  Dear RN,    Please:    1. Create Telephone encounter for this patient.  2. Share with the patient the attached results.  Please let the patient know her lab results are stable overall. Iron levels are low. Please provide the creatinine (measure of kidney function) as well as any other requested results    Plan:  1. Will start iron supplement, ferrous sulfate 235 mg every other day. Will also start torsemide 5 mg daily to help with swelling and blood pressure. Please have her recheck labs 4 weeks after starting the new BP medication. I have placed the orders.    2. Type of Outreach: 1 phone call and if unable to reach send letter    3. Document the conversation in the Telephone Encounter and close the encounter, no need to send back to me.    Thank you,  Ilona Sorrel

## 2023-04-16 ENCOUNTER — Other Ambulatory Visit: Payer: Self-pay

## 2023-04-16 ENCOUNTER — Encounter (HOSPITAL_BASED_OUTPATIENT_CLINIC_OR_DEPARTMENT_OTHER): Payer: Self-pay

## 2023-04-16 ENCOUNTER — Ambulatory Visit: Payer: No Typology Code available for payment source | Attending: Ophthalmology

## 2023-04-16 DIAGNOSIS — H401131 Primary open-angle glaucoma, bilateral, mild stage: Secondary | ICD-10-CM | POA: Insufficient documentation

## 2023-04-16 NOTE — Progress Notes (Signed)
Pt here for VF test and optic nerve OCT                Procedure performed by technician under supervision of attending MD.

## 2023-04-19 ENCOUNTER — Ambulatory Visit (HOSPITAL_BASED_OUTPATIENT_CLINIC_OR_DEPARTMENT_OTHER): Payer: Self-pay

## 2023-04-19 NOTE — Dental Note (Signed)
OD INTAKE, 2+ PAX, PANO COMPLETED    Patient Health Assessment    Date:            04/19/2023  Blood Pressure:  160/65  Pulse:           116    Patient presents for new patient screening visit.  Accompanied by: N/A  Interpreter: [place]    **Med hx**  Allergies: None  Pertinent medical conditions: MDD, CKD Stage III, HTN, panic disorder, GERD,  hyperparathyroidism, other  Pertinent meds: statin, amlodipine, clonazepam, omeprazole, gabapentin,  levothyroxine, other  Surgical hx: bilateral cataract surgery   1 yr ago    **Sara Snyder hx**  CC: "I am having a lot of problems with my dentures."  HPI: Pt reports current upper complete denture is very loose and   uncomfortable,   always falls out when she is talking/eating, does not function well; pt also  has md cast rpd which she never wears due to severe pain when inserted. Pt  reports current set of dentures is   7-8 yrs old.  Last Michie Molnar visit:   7-8 yrs  Last xrays:   7-8 yrs  Any current symptoms: No tooth symptoms, mostly issues w/current dentures  Previous trauma to head/neck: None  Any issues relevant to provision of care (i.e. problem w/anesthesia, unable  to be supine, cannot tolerate water, strong gag reflex, prior negative  experiences, anxiety/phobia, etc.): mild-mod Jaquasha Carnev[place] anxiety  Satisfaction with Larua Collier health and appearance (any desired changes?): Pt  mostly concerned with function of remaining dentition and prosthetics    **Social hx**  Oral hygiene: Pt brushes and flosses daily  Sugar: Little to none  Tob: Pt denies  EtOH: Pt denies  Recreational drugs: Pt denies    **Oral Screening**  1) Preventive: Yes  2) Perio (nonsurgical): Possibly  3) Perio (surgical): No  3) Operative: Yes  4) Endo: No  5) Oral surgery: Possibly  6) Pathology: TBD  7) Ortho: No  8) Prosth (Removable): Yes  9) Prosth (Fixed - C&B): No  10) Implant (Single, Quad, Full arch, Full mouth): Possibly    Caries risk: High  Xrays taken: 2+ PAX, [place]    **Plan**  Assigned to: Dr.  Dennard Schaumann  Case urgency (stat 1-2 days, asap 1-2 weeks, non-urgent/routine within 1 mo):  Non-urgent  Next visit: COE    OD process and provider assignment reviewed - pt understands and accepts,  satisfied w/today's visit, all questions answered.    Assistant: Mamie Levers  Attending: Dr. Binnie Kand      ----- Signed on Monday, April 19, 2023 at 8:40:51 PM -----  ----- Provider: Eduard Roux,  -- Clinic: [facility] -----

## 2023-04-20 ENCOUNTER — Telehealth (HOSPITAL_BASED_OUTPATIENT_CLINIC_OR_DEPARTMENT_OTHER): Payer: Self-pay

## 2023-04-20 NOTE — Telephone Encounter (Signed)
Spoke w/ pharmacy  Confirmed that pt has machine and should fit regular and large adults.       Spoke with dtr/pt  Relayed information  Other dtr has been keeping log for pt  Unable to schedule appt right noe, but will call back to schedule    Paulino Rily, RN, 04/20/2023

## 2023-04-20 NOTE — Telephone Encounter (Deleted)
-----   Message from Freddie Breech sent at 04/06/2023 10:58 AM EDT -----  Regarding: BP cuff and teaching appt  [facility],     Patient prescribed-ed  BP cuff at our last appointment - needs large arm cuff     Can you follow up to see if pharmacy has it ready?    When ready:      - book RN appointment for BP cuff fit, validation if applicable, and teaching for how to take BP and record log  - pick up machine and bring to that appointment        Thx!     P    Pooja R. Delice Bison, MD  04/06/23 10:58 AM

## 2023-04-20 NOTE — Telephone Encounter (Signed)
-----   Message from Freddie Breech sent at 04/06/2023 10:58 AM EDT -----  Regarding: BP cuff and teaching appt  [facility],     Patient prescribed-ed  BP cuff at our last appointment - needs large arm cuff     Can you follow up to see if pharmacy has it ready?    When ready:      - book RN appointment for BP cuff fit, validation if applicable, and teaching for how to take BP and record log  - pick up machine and bring to that appointment        Thx!     P    Pooja R. Delice Bison, MD  04/06/23 10:58 AM

## 2023-04-21 ENCOUNTER — Encounter (HOSPITAL_BASED_OUTPATIENT_CLINIC_OR_DEPARTMENT_OTHER): Payer: Self-pay | Admitting: Registered Nurse

## 2023-04-22 ENCOUNTER — Telehealth (HOSPITAL_BASED_OUTPATIENT_CLINIC_OR_DEPARTMENT_OTHER): Payer: Self-pay

## 2023-04-22 NOTE — Progress Notes (Signed)
Confirmed with patient's husband    Confirmed patient's procedure on 05/06/23 at  8:45am.      Ride home requirement reviewed.      Asked patient to call [phone] or [phone] with any questions or cancellation request.      EGD prep instructions sent via mail.    Reviewed medications and no SGLT-2 or GLP-1 medications noted.

## 2023-04-23 ENCOUNTER — Ambulatory Visit: Payer: No Typology Code available for payment source | Attending: Psychiatry

## 2023-04-23 DIAGNOSIS — F411 Generalized anxiety disorder: Secondary | ICD-10-CM | POA: Diagnosis present

## 2023-04-23 DIAGNOSIS — F321 Major depressive disorder, single episode, moderate: Secondary | ICD-10-CM | POA: Insufficient documentation

## 2023-04-23 NOTE — Progress Notes (Signed)
 ADULT OUTPATIENT PSYCHIATRY (OPD and PCBHI)  THERAPY FOLLOW UP     BH TREATMENT TEAM:  [place]    CHIEF COMPLAINT:   "Nothing goes well for me, I feel incapable and unhappy."    INTERVAL HISTORY:   Sara Snyder has been crying, not bathing, she worked hard to have a stable life, didn't achieve her goals, e.g., to have a home. Worried that her daughter has to pay the expenses and take ct to appointments. Has 3 son's in the [name].  Marital problem, e.g., her husband is offensive.  No longer has female friends.  Increased weight, physical pain, trembling, decreased sleep.      CURRENT MEDICATIONS:    Current Outpatient Medications   Medication Sig    ferrous sulfate 325 (65 FE) MG tablet Take 1 tablet by mouth every other day    torsemide (DEMADEX) 5 MG tablet Take 1 tablet by mouth daily    meclizine (ANTIVERT) 25 MG TABS Take 1 tablet by mouth every 8 (eight) hours as needed    cholecalciferol (VITAMIN D3) 2000 UNIT tablet Take 1 tablet by mouth in the morning and 1 tablet before bedtime.    calcium citrate-citamin D (CALCIUM CITRATE +) 315-5 MG-MCG tablet Take 1 tablet by mouth in the morning and 1 tablet before bedtime.    OTHER MEDICATION Use 1 each As Directed daily Blood Pressure Monitoring Kit   Arm Circumference LARGE  For use as directed: "Please Dispense AMA validated cuff see [employer]usi[employer]hu (such as Omron)"       DX: hypertension  ICD10: I10    Blood Pressure KIT Use 1 kit As Directed daily before breakfast Regular ICD i10    DULoxetine (CYMBALTA) 60 MG capsule Take 1 capsule by mouth in the morning.    omeprazole (PRILOSEC) 40 MG capsule Take 1 capsule by mouth in the morning.    spironolactone (ALDACTONE) 50 MG tablet Take 1 tablet by mouth in the morning. STOP ATENOLOL, START THIS MEDICATION.    atorvastatin (LIPITOR) 80 MG tablet Take 1 tablet by mouth in the morning.    clonazePAM (KLONOPIN) 1 MG tablet 1 tablet two times daily, PRN    levothyroxine (SYNTHROID) 50 MCG tablet Take 1  tablet by mouth every morning before breakfast    chlorthalidone (HYGROTEN) 25 MG tablet Take 1 tablet by mouth in the morning.    gabapentin (NEURONTIN) 100 MG capsule Take 1 capsule by mouth in the morning and 1 capsule at noon and 1 capsule before bedtime.    budesonide-formoterol (SYMBICORT) 160-4.5 MCG/ACT inhaler Inhale 2 puffs into the lungs in the morning and 2 puffs before bedtime. Use 1 puff prn during the day. Rinse mouth after use..    TruForm Stockings 10-72mmHg (COMPRESSION STOCKINGS) B/l compression stocking    amlodipine-valsartan (EXFORGE) 5-320 MG per tablet Take 1 tablet by mouth in the morning.    latanoprost (XALATAN) 0.005 % ophthalmic solution Place 1 drop into both eyes nightly     No current facility-administered medications for this visit.         LANGUAGE OF CARE:    [place]      LANGUAGE NEEDS MET:    Provider/Staff Proficient in Patient?s Language (not [place])    CURRENT TREATMENT/CONTACT INFO FOR OTHER AGENCIES AND MENTAL HEALTH PROVIDERS (if applicable):     Contact/Community Support    No documentation.                   COLUMBIA RISK ASSESSMENTS:  Suicide:  [facility] OP LAST CONTACT      Flowsheet Row BH Televisit from 04/23/2023 in [facility]   Have you wished you were dead or wished you could go to sleep and not wake up? No   Have you actually had any thoughts of killing yourself?  No   Have you done anything, started to do anything, or prepared to do anything to end your life? No   C-SSRS OP Last Contact Screener Risk Score No Risk           C-SSRS Risk Assessment    No documentation.                   VIOLENCE:    Violence/Abuse Risk    No documentation.                    ADDITIONAL SCREENINGS:       11/06/2021     1:57 PM 11/06/2021     3:24 PM 12/03/2022     4:17 PM   PHQ9 SCREENING   Patient refused PHQ-9  No    Little interest or pleasure in doing things Not at all Several Days Nearly Every Day   Feeling down, depressed, or hopeless Not at all  Several Days Nearly Every Day   Trouble falling asleep, staying asleep, or sleeping too much  Not at all Several Days   Feeling tired or having low energy  Several Days Several Days   Poor appetite or overeating  Not at all Not at all   Feeling bad about yourself - or that you are a failure or have let yourself or your family down  Not at all Not at all   Trouble concentrating on things, such as school work, reading the newspaper, or watching television  Not at all Not at all   Moving or speaking slowly that other people could have noticed.  Or the opposite - being so fidgety or restless that you have been  moving around a lot more than ususal  Not at all Not at all   Thoughts that you would be better off dead or of hurting yourself in some way  Not at all Not at all   TOTAL  3 8   If you checked off any problems, how difficult have these problems made it for you to do your work, take care of things at home, or get along with people?  Not difficult at all          12/03/2022     4:17 PM 11/06/2021     3:24 PM 11/06/2021     1:57 PM   GAD-7 FLOWSHEET   Feeling Nervous/Anxious/ On Edge 3 1 1    Unable to Stop Worrying 3 0 0         12/03/2022     4:14 PM   SDOH LAST 3 VALUES   Patient is willing to answer these questions Yes   In the last 12 months, did you worry that your food would run out before you got money to buy more? Never   In the last 12 months, the food you bought didn?t last and you didn?t have money to get more Never   In the last 12 months, has the electric, gas or oil company threatened to shut off services in your home? No   In the last 12 months, have you or your family had trouble getting transportation to medical appointments? No  What is your living situation today? I have a steady place to live.   Do you have access to the internet at home? (Select all that apply) Yes, on a computer,Yes, on my phone   In the last 12 months, have you experienced violence at home or in your relationships? No   Would you  like a [place] care team member to reach out to help you with the needs you checked off above? No   May we refer you to free or low cost community programs (like food pantries) by sharing your name, phone and address so they can reach you? No       MENTAL STATUS EXAMINATION:   Mental Status Exam - 04/27/23 1056          Mental Status Exam     General Appearance Not able to observe     Behavior Cooperative     Level of Consciousness Alert     Orientation Level Grossly intact     Attention/Concentration Fluctuating     Mannerisms/Movements Not able to observe     Speech Quality and Rate WNL     Speech Clarity Clear     Speech Tone Normal vocal inflection     Vocabulary/Fund of Knowledge WNL     Memory Intact     Thought Process & Associations Goal-directed     Dissociative Symptoms None     Thought Content No abnormalities reported or observed     Hallucinations None     Suicidal Thoughts None     Homicidal Thoughts None     Mood Anxious;Depressed/Sad     Affect Not able to observe     Judgment Fair     Insight Fair                     RISK ASSESSMENTS:       Violence: low (1)     Addiction: low (1)     ASSESSMENT:    Ct is depressed and anxious in the context of medical problems, her husband's rudeness, no female friends, no SI. Her dedicated daughter is her protective factor.     DIAGNOSES:    GAD  MDD, moderate  (PTSD)    PLAN:    Psychotherapy - Marshia Ly, PhD, LICSW  Psychopharm - Marcha Dutton, APRN  Urgent Care at [facility] or [place], daughter [phone]    REVIEWING TODAY'S VISIT:    Reviewing Today's Visit:  Clinical Interventions Today: Cognitive Behavioral Therapy (CBT)  Patient's Response to Interventions: Responsive  Time Spent in Psychotherapy: 45 minutes            CARE PLAN/ EPISODES:  No linked episodes    Patient/Guardian:       Strengths/Skills:      Potential Barriers:      Patient stated goals:      Problem 1:      Short Term Goals:    Short Term Target:     Short TermTarget Date:    Short  Term Goal #1 Progress:       Long Term Goals:    Long Term Target:     Long TermTarget Date:      Long Term Goal #1 Progress:       Intervention #1:    Intervention Frequency:     Intervention Durati

## 2023-04-27 NOTE — Telephone Encounter (Signed)
Spoke w/ pt    Sx started Saturday  Lump on internal labia. Unable to visualized it. So unsure if open  Lump is growing, about a size of a kidney bean  Pain with touch  Area is sensitive. Noticed some blood on toilet paper when she wiped once.     Requested I call dtr to schedule an appt    Spoke with [name],   Schedule appt for Thursday. Will bring BP log and BP cuff for validation.  Also, had appt w/ therapist on 11/1. Therapist states depression is not well controlled and would recommend her being on another medication of top of current therapy.     Scheduled appt with provider    Paulino Rily, RN, 04/27/2023

## 2023-04-28 ENCOUNTER — Other Ambulatory Visit (HOSPITAL_BASED_OUTPATIENT_CLINIC_OR_DEPARTMENT_OTHER): Payer: Self-pay

## 2023-04-28 DIAGNOSIS — Z1231 Encounter for screening mammogram for malignant neoplasm of breast: Secondary | ICD-10-CM

## 2023-04-29 ENCOUNTER — Other Ambulatory Visit: Payer: Self-pay

## 2023-04-29 ENCOUNTER — Ambulatory Visit: Payer: No Typology Code available for payment source | Attending: Internal Medicine

## 2023-04-29 ENCOUNTER — Encounter (HOSPITAL_BASED_OUTPATIENT_CLINIC_OR_DEPARTMENT_OTHER): Payer: Self-pay

## 2023-04-29 VITALS — BP 133/74 | HR 92 | Temp 97.1°F | Ht 59.06 in | Wt 201.0 lb

## 2023-04-29 DIAGNOSIS — F330 Major depressive disorder, recurrent, mild: Secondary | ICD-10-CM | POA: Insufficient documentation

## 2023-04-29 DIAGNOSIS — N1831 Chronic kidney disease, stage 3a: Secondary | ICD-10-CM | POA: Diagnosis present

## 2023-04-29 DIAGNOSIS — I10 Essential (primary) hypertension: Secondary | ICD-10-CM | POA: Insufficient documentation

## 2023-04-29 DIAGNOSIS — N9089 Other specified noninflammatory disorders of vulva and perineum: Secondary | ICD-10-CM | POA: Insufficient documentation

## 2023-04-29 MED ORDER — DULOXETINE HCL 30 MG PO CPEP
30.0000 mg | ORAL_CAPSULE | Freq: Every day | ORAL | 0 refills | Status: DC
Start: 2023-04-29 — End: 2023-05-25

## 2023-04-29 NOTE — Patient Instructions (Signed)
Any person can visit the [facility] or call our Crisis Line.     [facility] Psychiatric Urgent Care/Access Clinic (All Ages):  [place] Location: [facility], [place] [address], [address] [place], [place] [address]  [place] Location: [facility], [address] [place], [place] [address]  Hours: Monday- Friday 8am to 8pm and Saturday- Sunday 9am to 5:00pm. Closed on major holidays     [place] Access and Crisis Call Service (all ages):  24-hour crisis response and access to behavioral health services [phone]      Patients with [facility] can always get behavioral health support from their local [facility]). Call or text the [facility] Help Line at [phone] or Google search "[facility]" plus the name of your home town to find your [facility] location.

## 2023-04-29 NOTE — Progress Notes (Signed)
Blood pressure cuff validation per AMA 2020  STEP 1: Complete the table below.  5 blood pressure readings taken using a combination of the patient's SMBP device and the office's method of blood pressure measurement.     MEASUREMENT DEVICE SYSTOLIC BLOOD PRESSURE (SBP)   A PATIENT'S 117/76   B PATIENT'S 139/87   C OFFICE'S 131/80   D PATIENT'S 131/84   E OFFICE'S 116/76     STEP 2:   Average measurements B and D ((B+D)/2): 135  The difference of average of B and D (above) and measurement C (Above - C): 4  The difference is: <5 mmHg: This device CAN BE USED for SMBP  *If the difference is <68mmHg: SKIP step 3      Arm cirm 31 cm  Pt's home cuff fits pt appropriately  HR90s

## 2023-04-29 NOTE — Progress Notes (Signed)
Conf 11/14 appt 845 arr

## 2023-04-29 NOTE — Progress Notes (Signed)
Interpreter: A Portuguese face to face interpreter  was used today.     SUBJECTIVE  Chief Complaint: vaginal concerns, bp log    History of Present Illness: Patient Sara Snyder is a 67 year old female with PMHx of below who presents for:    Presents with her daughter today who is supportive throughout visit. History primarily from [place].     # bp log  - log reviewed, readings mostly 130s/80s.      Neprhology visit reviewed 10/16: continue ARB, given leg swelling start torsemide 5 mg daily and labs in 4 weeks, goal BP <130/80, continue chlorthalidone 25 mg and Exforge 5-320 mg. Continue holding spironolactone for now."    Has Nephrology f/u scheduled 11/27    Tells me she is taking the torsemide at night, started it Saturday - it's making her get up to urinate at night and she worries about moving the med to earlier because then she doesn't take a bp med at night.     Taking chlorthalidone in afternoon, this is her strong preference, wants to take it with her anxiety medication so that she has BP med in afternoon.      No cp, sob.       # depression   Feeling more depressed  She also has fibromyalgia   Duloxetine 60 mg daily   Feels people, family are rude to her, not grateful. She elaborates on this profusely.   Grandson, son in law rude to her   Daughter is good relationship   Patient and daughter really want to try a higher dose of duloxetine.     Previously tried   venlafaxine (not effective)  fluoxetine  sertraline (not effective)     # vaginal concerns  - sore on the labia, yesterday it bled a bit  Using a panty liner   The sore has been there for 1 year, like a tough callus, but then worse x 1 week        Review of Systems: Pertinent positives and negatives per HPI.    PMH:  Patient Active Problem List:     Essential hypertension     Labyrinthitis     Panic disorder     Hyperlipidemia     Mild episode of recurrent major depressive disorder (HCC)     Allergic rhinitis     Chronic pain of both shoulders      Stage 3a chronic kidney disease     Anxiety state     Psychophysiological insomnia     Secondary hyperparathyroidism (HCC)     Osteopenia     Carpal tunnel syndrome of right wrist     Fibromyalgia     Hair loss     Vitamin D deficiency     Obesity (BMI 30-39.9)     Bilateral renal artery stenosis (HCC)     DOE (dyspnea on exertion)     Recurrent epistaxis     Gastroesophageal reflux disease without esophagitis     Other specified hypothyroidism     Combined forms of age-related cataract of right eye     Cervical cancer screening     Prediabetes     Healthcare maintenance     Schistosomiasis     Intraoperative floppy iris syndrome (IFIS)     Combined forms of age-related cataract of left eye     Varicose veins of calf     Chronic bilateral low back pain without sciatica     Morbid obesity with BMI  40.0-44.9, adult [facility])      Past Surgical History:  No date: CATARACT REMOVAL INSERTION OF LENS; Right  10/2016: HEMORRHOID SURGERY  No date: OB ANTEPARTUM CARE CESAREAN DLVR & POSTPARTUM    Social History    Tobacco Use      Smoking status: Former        Packs/day: 0.00        Types: Cigarettes        Quit date: 10/27/1977        Years since quitting: 45.5      Smokeless tobacco: Never    Alcohol use: Not Currently      No family history on file.        Current Outpatient Medications:     ferrous sulfate 325 (65 FE) MG tablet, Take 1 tablet by mouth every other day, Disp: 15 tablet, Rfl: 11    torsemide (DEMADEX) 5 MG tablet, Take 1 tablet by mouth daily, Disp: 90 tablet, Rfl: 0    meclizine (ANTIVERT) 25 MG TABS, Take 1 tablet by mouth every 8 (eight) hours as needed, Disp: 30 tablet, Rfl: 5    cholecalciferol (VITAMIN D3) 2000 UNIT tablet, Take 1 tablet by mouth in the morning and 1 tablet before bedtime., Disp: 180 tablet, Rfl: 3    calcium citrate-citamin D (CALCIUM CITRATE +) 315-5 MG-MCG tablet, Take 1 tablet by mouth in the morning and 1 tablet before bedtime., Disp: 180 tablet, Rfl: 3    OTHER MEDICATION, Use 1 each  As Directed daily Blood Pressure Monitoring Kit  Arm Circumference LARGE For use as directed: "Please Dispense AMA validated cuff see [employer] (such as Om[employer])"    DX: hypertension ICD10: I10, Disp: 1 each, Rfl: 0    Blood Pressure KIT, Use 1 kit As Directed daily before breakfast Regular ICD i10, Disp: 1 kit, Rfl: 0    DULoxetine (CYMBALTA) 60 MG capsule, Take 1 capsule by mouth in the morning., Disp: 90 capsule, Rfl: 1    omeprazole (PRILOSEC) 40 MG capsule, Take 1 capsule by mouth in the morning., Disp: 90 capsule, Rfl: 0    spironolactone (ALDACTONE) 50 MG tablet, Take 1 tablet by mouth in the morning. STOP ATENOLOL, START THIS MEDICATION., Disp: 30 tablet, Rfl: 2    atorvastatin (LIPITOR) 80 MG tablet, Take 1 tablet by mouth in the morning., Disp: 90 tablet, Rfl: 3    clonazePAM (KLONOPIN) 1 MG tablet, 1 tablet two times daily, PRN, Disp: 60 tablet, Rfl: 2    levothyroxine (SYNTHROID) 50 MCG tablet, Take 1 tablet by mouth every morning before breakfast, Disp: 90 tablet, Rfl: 3    chlorthalidone (HYGROTEN) 25 MG tablet, Take 1 tablet by mouth in the morning., Disp: 90 tablet, Rfl: 3    gabapentin (NEURONTIN) 100 MG capsule, Take 1 capsule by mouth in the morning and 1 capsule at noon and 1 capsule before bedtime., Disp: 90 capsule, Rfl: 2    budesonide-formoterol (SYMBICORT) 160-4.5 MCG/ACT inhaler, Inhale 2 puffs into the lungs in the morning and 2 puffs before bedtime. Use 1 puff prn during the day. Rinse mouth after use.., Disp: 1 each, Rfl: 1    TruForm Stockings 10-29mmHg (COMPRESSION STOCKINGS), B/l compression stocking, Disp: 2 each, Rfl: 0    amlodipine-valsartan (EXFORGE) 5-320 MG per tablet, Take 1 tablet by mouth in the morning., Disp: 30 tablet, Rfl: 11    latanoprost (XALATAN) 0.005 % ophthalmic solution, Place 1 drop into both eyes nightly, Disp: 2.5 mL, Rfl: 12  Review of Patient's Allergies indicates:   Pollen extract          Cough    OBJECTIVE  BP 133/74   Pulse 92   Temp 97.1 F  (36.2 C) (Temporal)   Ht 4' 11.06" (1.5 m)   Wt 91.2 kg (201 lb)   SpO2 95%   BMI 40.52 kg/m     Physical Exam:  Gen: Well-appearing, in no acute distress.    Skin: Warm, dry, no evident rashes or lesions.  HEENT: NCAT. EOMI, conjunctiva clear. Hearing intact to voice. Moist mucous membranes.  Neck: Full ROM.   Pulm: Not using accessory muscles of respiration.   GYN:  External exam only. External genitalia normal. + papule w/ ulcerated center R labia majora.   MSK: Moving all 4 extremities appropriately.   Neuro: Awake, alert & oriented to person, place, and time. Gait and balance normal.   Psychiatric Exam:  Appearance: Cleanly-dressed, well-appearing.  Behavior: Appropriate, good eye contact.  Alertness: Alert and oriented.  Speech: Fast and loud.   Mood: anxious, tearful at times   Affect: Congruent with mood.  Thought Process: Linear, logical, and goal-directed.  Thought Content: No delusions. No paranoia.  Perceptions: No evidence of auditory or visual hallucinations observed.   Judgment/Impulse Control: Good/intact  Insight: Good.  Cognition: Not formally tested, but intact to conversation.  Suicidal/Homicidal: Denied current SI      ASSESSMENT/PLAN    1. Essential hypertension  At recent Nephrology apt, torsemide was added due to leg swelling. BP log reviewed, in general slightly above goal. Pt just started torsemide Sunday. No cp, sob. Continue current and f/u w/ Nephrology as planned 11/27.     2. Mild episode of recurrent major depressive disorder (HCC)  Pt w/ hx of MDD, fibromyalgia, panic disorder, anxiety. Patient reports her depression is worse for a couple of months due to family/interpersonal issues and her chronic health conditions, she elaborates on this profusely. She is engaged with therapy. Denies SI. Reports one time when very emotional she had thought to cut her throat with a knife, reports she never planned to go through with this. Repeatedly denies SI/SIB or any plan to harm self.  Reports going on computer, ipad calms her down and this helps. Her daughter is very supportive, protective factor.     Patient and daughter really want to try a higher dose of duloxetine. Limited evidence to support doses >60 mg, however, discussed trial at increased dose as long as tolerating well. Does not need renal dosing as her CrCl is >30.   Continue therapy. F/u here 2-3 weeks for duloxetine, then Psychiatry as planned 12/7.     On assessment today does not meet section 12 criteria, does not appear to be acute risk of harm to self or others. On assesment today appears appropriate for continued outpatient follow up.   Crisis resources provided including Psychiatric Urgent Care and hotlines.    ED/911 precautions for severe symptoms.     - DULoxetine (CYMBALTA) 30 MG capsule; Take 1 capsule by mouth daily . Take together with duloxetine 60 mg daily for total dose of 90 mg daily.  Dispense: 30 capsule; Refill: 0    3. Stage 3a chronic kidney disease  Nephrology following. 2/2 HTN in setting of renal artery stenosis. Recently added torsemide due to leg swelling, pt taking at night and having to urinate frequently at night. Recommend taking torsemide in am, she agrees and is reassured about this. Continue plan per Nephrology which  was reinforced today.     4. Vulvar lesion  C/o a sore on the labia, yesterday it bled a bit. The sore has been there for 1 year, like a tough callus, but then worse x 1 week. On exam, + papule w/ ulcerated center. HPI not c/w HSV and no prior history of HSV so think this is unlikely. Referral to Gyn to consider biopsy given the lesion has been there for 1 year and now changed.   - REFERRAL TO GYNECOLOGY (INT)    Follow-Up: 2-3 weeks for duloxetine     Precautions given, all questions answered.   Discussed risks/benefits, SEs of medications.     CC Chart: PCP, Raval, Pooja R, MD    I spent a total of 40 minutes on this visit on the date of service (total time includes all activities  performed on the date of service)    Jeanell Sparrow, PA-C

## 2023-04-30 ENCOUNTER — Other Ambulatory Visit (HOSPITAL_BASED_OUTPATIENT_CLINIC_OR_DEPARTMENT_OTHER): Payer: Self-pay

## 2023-04-30 ENCOUNTER — Other Ambulatory Visit: Payer: Self-pay

## 2023-04-30 ENCOUNTER — Ambulatory Visit
Admission: RE | Admit: 2023-04-30 | Discharge: 2023-04-30 | Disposition: A | Discharge status: Home / Self Care | Payer: No Typology Code available for payment source | Attending: Diagnostic Radiology | Admitting: Diagnostic Radiology

## 2023-04-30 ENCOUNTER — Encounter (HOSPITAL_BASED_OUTPATIENT_CLINIC_OR_DEPARTMENT_OTHER): Payer: Self-pay

## 2023-04-30 DIAGNOSIS — R9230 Dense breasts, unspecified: Secondary | ICD-10-CM | POA: Diagnosis present

## 2023-04-30 DIAGNOSIS — Z1231 Encounter for screening mammogram for malignant neoplasm of breast: Secondary | ICD-10-CM | POA: Insufficient documentation

## 2023-04-30 DIAGNOSIS — R928 Other abnormal and inconclusive findings on diagnostic imaging of breast: Secondary | ICD-10-CM

## 2023-05-06 ENCOUNTER — Ambulatory Visit (HOSPITAL_BASED_OUTPATIENT_CLINIC_OR_DEPARTMENT_OTHER): Payer: No Typology Code available for payment source | Admitting: Obstetrics & Gynecology

## 2023-05-06 ENCOUNTER — Ambulatory Visit (HOSPITAL_BASED_OUTPATIENT_CLINIC_OR_DEPARTMENT_OTHER): Payer: Self-pay | Admitting: Certified Registered"

## 2023-05-06 ENCOUNTER — Ambulatory Visit (LOCAL_COMMUNITY_HEALTH_CENTER): Payer: No Typology Code available for payment source

## 2023-05-06 ENCOUNTER — Ambulatory Visit
Admission: RE | Admit: 2023-05-06 | Discharge: 2023-05-06 | Disposition: A | Discharge status: Home / Self Care | Payer: No Typology Code available for payment source

## 2023-05-06 ENCOUNTER — Encounter (HOSPITAL_BASED_OUTPATIENT_CLINIC_OR_DEPARTMENT_OTHER): Payer: Self-pay | Admitting: Certified Registered"

## 2023-05-06 ENCOUNTER — Other Ambulatory Visit: Payer: Self-pay

## 2023-05-06 DIAGNOSIS — K219 Gastro-esophageal reflux disease without esophagitis: Secondary | ICD-10-CM

## 2023-05-06 DIAGNOSIS — N1831 Chronic kidney disease, stage 3a: Secondary | ICD-10-CM | POA: Insufficient documentation

## 2023-05-06 DIAGNOSIS — E785 Hyperlipidemia, unspecified: Secondary | ICD-10-CM | POA: Insufficient documentation

## 2023-05-06 DIAGNOSIS — K2950 Unspecified chronic gastritis without bleeding: Secondary | ICD-10-CM | POA: Diagnosis not present

## 2023-05-06 DIAGNOSIS — D509 Iron deficiency anemia, unspecified: Secondary | ICD-10-CM | POA: Diagnosis present

## 2023-05-06 DIAGNOSIS — Z87891 Personal history of nicotine dependence: Secondary | ICD-10-CM | POA: Insufficient documentation

## 2023-05-06 DIAGNOSIS — K2970 Gastritis, unspecified, without bleeding: Secondary | ICD-10-CM | POA: Diagnosis not present

## 2023-05-06 DIAGNOSIS — I129 Hypertensive chronic kidney disease with stage 1 through stage 4 chronic kidney disease, or unspecified chronic kidney disease: Secondary | ICD-10-CM | POA: Diagnosis not present

## 2023-05-06 DIAGNOSIS — K449 Diaphragmatic hernia without obstruction or gangrene: Secondary | ICD-10-CM | POA: Insufficient documentation

## 2023-05-06 DIAGNOSIS — Z6839 Body mass index (BMI) 39.0-39.9, adult: Secondary | ICD-10-CM | POA: Insufficient documentation

## 2023-05-06 MED ORDER — LIDOCAINE HCL (PF) 2 % IJ SOLN
Freq: Once | INTRAMUSCULAR | Status: DC | PRN
Start: 2023-05-06 — End: 2023-05-06
  Administered 2023-05-06: 100 mg via INTRAVENOUS

## 2023-05-06 MED ORDER — PROPOFOL 200 MG/20ML IV EMUL
Freq: Once | INTRAVENOUS | Status: DC | PRN
Start: 2023-05-06 — End: 2023-05-06
  Administered 2023-05-06: 70 mg via INTRAVENOUS

## 2023-05-06 MED ORDER — PROPOFOL 500 MG/50 ML IV
INTRAVENOUS | Status: DC | PRN
Start: 2023-05-06 — End: 2023-05-06
  Administered 2023-05-06: 160 ug/kg/min via INTRAVENOUS

## 2023-05-06 NOTE — Anesthesia Postprocedure Evaluation (Signed)
Anesthesia Post-Operative Evaluation Note    Patient: Sara Snyder           Procedure Summary       Date: 05/06/23 Room / Location: [facility]    Anesthesia Start: [phone] Anesthesia Stop: [phone]    Procedure: EGD Diagnosis: Gastroesophageal reflux disease, unspecified whether esophagitis present    Scheduled Providers: Seth Bake, MD Responsible Provider: Mathis Fare, MD    Anesthesia Type: MAC ASA Status: 3              POST-OPERATIVE EVALUATION    Anesthesia Post Evaluation    Vitals signs in patient's normal range: Yes  Respiratory function stable; airway patent: Yes  Cardiovascular function stable: Yes  Hydration status stable: Yes  Mental status recovered; patient participates in evaluation and/or is at baseline: Yes  Pain control satisfactory: Yes  Nausea and vomiting control satisfactory: YesProcedure was labor & delivery no  PostOP disposition PACU  Anesthesia Observation no significant observation    MIPS#404 Anesthesiology Smoking Abstinence:     The patient is a current smoker (e.g. cigarette, cigar, pipe, e-cigarette/vaping/marijuana) (VW098): No   FOR CODING USE ONLY: IF BLANK J1914    MIPS#477 Multimodal Pain Management:  Emergent - Exclusion case: No  Patient was administered multimodal pain management (two or more drugs and/or interventions excluding systemic opioids) in the perioperative period; occurring at some time between 6 hours prior to anesthesia start time until discharged from: No   List medical reason(s) patient did not receive multimodal pain management (N8295): no pain anticipated postop  FOR CODING USE ONLY: REASON NOT LISTED A2130    QMVH#846 Perioperative Temperature Management:  Anesthesia start to Anesthesia end time was 60 minutes or longer (4256F): No   FOR CODING USE ONLY: REASON NOT LISTED N6295    MIPS#430 Adult Prevention of PONV:  Patient received an inhalational anesthetic (MW413): No  FOR CODING USE ONLY: REASON NOT LISTED K4401    MIPS#463  Pediatric Prevention of PONV:  Pediatric patient?: No  FOR CODING USE ONLY: REASON NOT LISTED U2725        [employer] # [id number]        Last vitals    BP: 191/87 (05/06/2023  8:48 AM)  Temp: 98.2 F (36.8 C) (05/06/2023  8:48 AM)  Pulse: 101 (05/06/2023  8:48 AM)  Resp: (!) 0 (05/06/2023  9:22 AM)  SpO2: 99 % (05/06/2023  8:48 AM)

## 2023-05-06 NOTE — PROVATION-GI (Signed)
[facility]  Patient Name: Sara Snyder  MRN: [id number]  CSN: [id number]  Date of Birth: August 16, 1955  Admit Type: Outpatient  Age: 67  Gender: Female  Note Status: Finalized  Patient Location: [facility]  Referring MD:          Carla Drape. Janice Norrie, PA  Procedure Date:        05/06/2023 8:45:27 AM  Procedure:             Upper GI endoscopy  Endoscopist:           Edsel Petrin MD, MD  Indications for Procedure:       Iron deficiency anemia, Esophageal reflux  Medications:           Monitored Anesthesia Care  Procedure:       Just prior to the procedure, an updated history and physical was done. I        obtained an informed consent from the patient reviewing the risk of the        procedure including (but not limited to) respiratory depression, perforation,        bleeding, discomfort, a possible need for surgery and unexpected reactions to        medications. The patient is aware that test has limitations and may not        detect significant lesions such as cancer or other potential diseases. The        patient was also informed that they might need a repeat upper endoscopy        earlier than standard guidelines if there are changes in their symptoms or        concerning findings noted. A time out was performed with the entire procedure        staff present. The scope was passed under direct vision. Throughout the        procedure, the patient's blood pressure, pulse, and oxygen saturations were        monitored continuously. The [id number] was introduced through the        mouth, and advanced to the second part of duodenum. The upper GI endoscopy        was accomplished without difficulty. The patient tolerated the procedure well.  Findings:       A small hiatal hernia was present.       The Z-line was regular and was found 35 cm from the incisors.       Patchy mild inflammation characterized by erythema was found in the entire        examined stomach. Biopsies were taken with a cold  forceps for Helicobacter        pylori testing.       The examined duodenum was normal.  Total Procedure Duration: 0 hours 2 minutes 14 seconds   Post Procedure Diagnosis:       - Small hiatal hernia.       - Z-line regular, 35 cm from the incisors.       - Gastritis. Biopsied.       - Normal examined duodenum.  Complications:         No immediate complications.  Estimated Blood Loss:  Estimated blood loss was minimal.  Recommendation:       - Await pathology results.       - GERD prevention diet.       - Follow an antireflux regimen.       - Continue omeprazole       -  Recommend colonoscopy for IDA noted in labs  Seth Bake, MD  Edsel Petrin MD, MD  05/06/2023 9:25:12 AM  This report has been signed electronically.  Number of Addenda: 0  Note Initiated On: 05/06/2023 8:45 AM

## 2023-05-06 NOTE — Anesthesia Preprocedure Evaluation (Signed)
Pre-Anesthetic Note  .      Patient: Sara Snyder is a 67 year old female      Procedure Information       Date/Time: 05/06/23 0930    Scheduled providers: Seth Bake, MD    Procedure: EGD    Diagnosis: Gastroesophageal reflux disease, unspecified whether esophagitis present [K21.9]    Location: [facility]            Relevant Problems   PULMONARY   (+) DOE (dyspnea on exertion)      CARDIO   (+) Bilateral renal artery stenosis (HCC)   (+) DOE (dyspnea on exertion)   (+) Essential hypertension   (+) Varicose veins of calf      GI   (+) Gastroesophageal reflux disease without esophagitis      GU/RENAL   (+) Stage 3a chronic kidney disease      ENDO   (+) Other specified hypothyroidism           Previous Anesthetic History:   Past Surgical History:  No date: CATARACT REMOVAL INSERTION OF LENS; Right  10/2016: HEMORRHOID SURGERY  No date: OB ANTEPARTUM CARE CESAREAN DLVR & POSTPARTUM        Medications  Current Outpatient Medications   Medication Sig   . DULoxetine (CYMBALTA) 30 MG capsule Take 1 capsule by mouth daily . Take together with duloxetine 60 mg daily for total dose of 90 mg daily.   . ferrous sulfate 325 (65 FE) MG tablet Take 1 tablet by mouth every other day   . torsemide (DEMADEX) 5 MG tablet Take 1 tablet by mouth daily   . meclizine (ANTIVERT) 25 MG TABS Take 1 tablet by mouth every 8 (eight) hours as needed   . cholecalciferol (VITAMIN D3) 2000 UNIT tablet Take 1 tablet by mouth in the morning and 1 tablet before bedtime.   . calcium citrate-citamin D (CALCIUM CITRATE +) 315-5 MG-MCG tablet Take 1 tablet by mouth in the morning and 1 tablet before bedtime.   . OTHER MEDICATION Use 1 each As Directed daily Blood Pressure Monitoring Kit   Arm Circumference LARGE  For use as directed: "Please Dispense AMA validated cuff see [email or web address]hu (such as Omron)"       DX: hypertension  ICD10: I10   . Blood Pressure KIT Use 1 kit As Directed daily before breakfast Regular ICD i10    . DULoxetine (CYMBALTA) 60 MG capsule Take 1 capsule by mouth in the morning.   Marland Kitchen omeprazole (PRILOSEC) 40 MG capsule Take 1 capsule by mouth in the morning.   Marland Kitchen spironolactone (ALDACTONE) 50 MG tablet Take 1 tablet by mouth in the morning. STOP ATENOLOL, START THIS MEDICATION.   Marland Kitchen atorvastatin (LIPITOR) 80 MG tablet Take 1 tablet by mouth in the morning.   . clonazePAM (KLONOPIN) 1 MG tablet 1 tablet two times daily, PRN   . levothyroxine (SYNTHROID) 50 MCG tablet Take 1 tablet by mouth every morning before breakfast   . chlorthalidone (HYGROTEN) 25 MG tablet Take 1 tablet by mouth in the morning.   . gabapentin (NEURONTIN) 100 MG capsule Take 1 capsule by mouth in the morning and 1 capsule at noon and 1 capsule before bedtime.   . budesonide-formoterol (SYMBICORT) 160-4.5 MCG/ACT inhaler Inhale 2 puffs into the lungs in the morning and 2 puffs before bedtime. Use 1 puff prn during the day. Rinse mouth after use..   . TruForm Stockings 10-57mmHg (COMPRESSION STOCKINGS) B/l compression stocking   .  amlodipine-valsartan (EXFORGE) 5-320 MG per tablet Take 1 tablet by mouth in the morning.   . latanoprost (XALATAN) 0.005 % ophthalmic solution Place 1 drop into both eyes nightly     No current facility-administered medications for this encounter.         Allergies:   Review of Patient's Allergies indicates:   Pollen extract          Cough    Smoking, Alcohol, Drugs:  Social History    Tobacco Use      Smoking status: Former        Packs/day: 0.00        Types: Cigarettes        Quit date: 10/27/1977        Years since quitting: 45.5      Smokeless tobacco: Never    Alcohol use: Not Currently    Drug use:   Unknown       PMHx:  Past Medical History:  09/11/2018: Chronic kidney disease, stage III (moderate) (HCC)  08/14/2019: COVID-19      Comment:  Risk category:   high  Date of symptom onset: 08/12/19                 Tested? Positive  Risk factors: CKD, BMI > 30, HTN                 Clinical Course First date of  symptoms:  08/12/19                 08/15/19: (DOI 3): Pt states she is feeling well. Slight                ST and sinus congestion. No cough, fever, SOB, DOE or                edema. F/U in 3 days.  08/18/2019: DOI 7- Feeling better                over all, stable vitals, CM f/u in 2 days. 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)  02/20/2021: Osteopenia  11/29/2017: Panic disorder  No date: Wears eyeglasses    Vitals  Ht 4' 11.84" (1.52 m)   Wt 91.5 kg (201 lb 12.8 oz)   BMI 39.62 kg/m     Review of Systems     Patient summary reviewed and Nursing notes reviewed      Anesthetic History:     Negative for:no malignant hyperthermia and no family history of malignant hyperthermia           Cardiovascular:  Positive for hypercholesterolemia and hypertension. Negative for CAD/MI.        LVEF:   60 %           Study Result    Narrative & Impression  Conclusions: (click link below for full report)  1. Left Ventricle: Global systolic function: EF is estimated visually at 60% .  2. Left Ventricle: Regional systolic function: Wall motion: There are no regional wall motion abnormalities.  3. Left Ventricle: Diastolic function: There is grade II diastolic dysfunction.  4. Left Atrium: Moderately dilated.  5. Aortic Valve: There is mild sclerosis; no stenosis. There is mild aortic regurgitation.  6. Tricuspid Valve: The RVSP is estimated at 30 mmHg which is  normal.  7. Pericardium: There is no pericardial effusion.      Symptoms: Pt. Stated that her arms were tired and heavy. Also feeling SOB.   Symptoms resolved with Aminophylline 50 mg IV.     IV Aminophylline (50 mg) was administered to reverse the side effects of   Lexiscan infusion.       Results:   EKG: Positive by ST criteria.   Arrhythmias: Occasional PVC's. Rare PAC.       Conclusions   Up to 1 mm of flat ST depression developed in  the inferolateral leads   after Lexiscan iknfusion.  There was no concomitant chest pain.     Nuclear Perfusion is reported separately under the imaging tab of Chart   Review.        Physical Activity in METs 4    Pulmonary: Negative for asthma, obstructive sleep apnea and tobacco use.   GU/Renal:  Positive for kidney disease (CKD stable at this time and BP seems uncontrolled but may be either white coat HTN or in the setting of spironolactone DC. No indication to refer back to vascular at this time.).   Neurological:  Negative for seizures and strokes.   Gastrointestinal:  Positive for GERD. Negative for nausea and vomiting.   Hematological: Negative for anemia and DVT.   Endocrine: Positive for hypothyroidism. Negative for Diabetes and hyperthyroidism.   Psychiatric:  Positive for depression.    Constitutional:  Negative for chills, fatigue and fever.       Physical Exam:     General     Level of consciousness:  Alert and oriented (time, person, place)     BMI   BMI greater than 30 kg/m2      Airway     Mallampati:  I    TM distance:  >3 FB    Mouth opening:  >3 FB    Neck ROM:  Mildly Limited     Teeth      }     Heart  - normal exam       Lungs - normal exam          HEENT:      Abdomen:        Other findings: 4 lower teeth left.                      Pertinent Labs:   Lab Results   Component Value Date    NA 138 04/07/2023    K 3.8 04/07/2023    CREAT 1.3 (H) 04/07/2023    GLUCOSER 100 04/07/2023    WBC 6.7 04/07/2023    HCT 34.3 04/07/2023    PLTA 297 04/07/2023         Anesthesia Plan    ASA Score:     ASA:  3    Airway:      Mallampati:  I    Mouth opening:  >3 FB    Neck ROM:  Mildly Limited    TM distance:  >3 FB     Plan: MAC    Other information:     EKG Reviewed: : Yes      Full Stomach Precaution:: No      Post-Plan::  PACU    Anesthesia Assessment and Plan:        Mask/ga     Informed Consent:     Anesthetic plan and risks discussed with:  Patient   Patient Consented  Attending Anesthesiologist  Statement:     Reassessed day of surgery: Yes        Assessment made, necessary equipment and appropriate plan in place.

## 2023-05-06 NOTE — H&P (Signed)
H&P NOTE     Chief Complaint:   GERD    PHI:  This 67 year old year old [place] speaking patient who presents for an EGD.    Interpreter: [phone]    The patient denies any symptoms currently.  She has had no dysphagia, melena, abdominal pain or weight loss. Of note, patient was started on iron for IDA. She is taking omeprazole 40mg  daily.     C/s x2  No Fhx of GI malignancies  Occasional ibuprofen  No blood thinners    Chronic kidney disease, stage III (moderate) (HCC) (09/11/2018), COVID-19 (08/14/2019), History of hypertension, HTN (hypertension), Mild episode of recurrent major depressive disorder (HCC) (01/05/2018), Osteopenia (02/20/2021), Panic disorder (11/29/2017), and Wears eyeglasses.     Patient Active Problem List:     Essential hypertension     Labyrinthitis     Panic disorder     Hyperlipidemia     Mild episode of recurrent major depressive disorder (HCC)     Allergic rhinitis     Chronic pain of both shoulders     Stage 3a chronic kidney disease     Anxiety state     Psychophysiological insomnia     Secondary hyperparathyroidism (HCC)     Osteopenia     Carpal tunnel syndrome of right wrist     Fibromyalgia     Hair loss     Vitamin D deficiency     Obesity (BMI 30-39.9)     Bilateral renal artery stenosis (HCC)     DOE (dyspnea on exertion)     Recurrent epistaxis     Gastroesophageal reflux disease without esophagitis     Other specified hypothyroidism     Combined forms of age-related cataract of right eye     Cervical cancer screening     Prediabetes     Healthcare maintenance     Schistosomiasis     Intraoperative floppy iris syndrome (IFIS)     Combined forms of age-related cataract of left eye     Varicose veins of calf     Chronic bilateral low back pain without sciatica     Morbid obesity with BMI 40.0-44.9, adult (HCC)        Review of Patient's Allergies indicates:   Pollen extract          Cough       Social History     Socioeconomic History    Marital status: Married      Spouse name: Not on file    Number of children: Not on file    Years of education: Not on file    Highest education level: Not on file   Occupational History    Not on file   Tobacco Use    Smoking status: Former     Current packs/day: 0.00     Types: Cigarettes     Quit date: 10/27/1977     Years since quitting: 45.5    Smokeless tobacco: Never   Substance and Sexual Activity    Alcohol use: Not Currently    Drug use: Never    Sexual activity: Yes     Partners: Male   Other Topics Concern    Not on file   Social History Narrative    Originally from [place], [place]    History of childhood trauma    Lives with family   Social Drivers of Catering manager Strain: Not on file  Food Insecurity: Not on file  Transportation Needs: Not on file  Physical Activity: Not on file  Stress: Not on file  Social Connections: Not on file  Intimate Partner Violence: Not on file  Housing Stability: Not on file      Current Outpatient Medications   Medication Sig    DULoxetine (CYMBALTA) 30 MG capsule Take 1 capsule by mouth daily . Take together with duloxetine 60 mg daily for total dose of 90 mg daily.    ferrous sulfate 325 (65 FE) MG tablet Take 1 tablet by mouth every other day    torsemide (DEMADEX) 5 MG tablet Take 1 tablet by mouth daily    meclizine (ANTIVERT) 25 MG TABS Take 1 tablet by mouth every 8 (eight) hours as needed    cholecalciferol (VITAMIN D3) 2000 UNIT tablet Take 1 tablet by mouth in the morning and 1 tablet before bedtime.    calcium citrate-citamin D (CALCIUM CITRATE +) 315-5 MG-MCG tablet Take 1 tablet by mouth in the morning and 1 tablet before bedtime.    OTHER MEDICATION Use 1 each As Directed daily Blood Pressure Monitoring Kit   Arm Circumference LARGE  For use as directed: "Please Dispense AMA validated cuff see [email or web address] (such as Omron)"       DX: hypertension  ICD10: I10    Blood Pressure KIT Use 1 kit As Directed daily before breakfast Regular ICD i10    DULoxetine (CYMBALTA) 60 MG capsule  Take 1 capsule by mouth in the morning.    omeprazole (PRILOSEC) 40 MG capsule Take 1 capsule by mouth in the morning.    spironolactone (ALDACTONE) 50 MG tablet Take 1 tablet by mouth in the morning. STOP ATENOLOL, START THIS MEDICATION.    atorvastatin (LIPITOR) 80 MG tablet Take 1 tablet by mouth in the morning.    clonazePAM (KLONOPIN) 1 MG tablet 1 tablet two times daily, PRN    levothyroxine (SYNTHROID) 50 MCG tablet Take 1 tablet by mouth every morning before breakfast    chlorthalidone (HYGROTEN) 25 MG tablet Take 1 tablet by mouth in the morning.    gabapentin (NEURONTIN) 100 MG capsule Take 1 capsule by mouth in the morning and 1 capsule at noon and 1 capsule before bedtime.    budesonide-formoterol (SYMBICORT) 160-4.5 MCG/ACT inhaler Inhale 2 puffs into the lungs in the morning and 2 puffs before bedtime. Use 1 puff prn during the day. Rinse mouth after use..    TruForm Stockings 10-55mmHg (COMPRESSION STOCKINGS) B/l compression stocking    amlodipine-valsartan (EXFORGE) 5-320 MG per tablet Take 1 tablet by mouth in the morning.    latanoprost (XALATAN) 0.005 % ophthalmic solution Place 1 drop into both eyes nightly     No current facility-administered medications for this encounter.         No family history on file.        REVIEW OF SYSTEMS:   Cardiovascular:  No chest pain, LE edema  Pulmonary:  No SOB or cough  Neuro:  No focal weakness, seizures    Physical Exam:  Temp:  [98.2 F (36.8 C)] 98.2 F (36.8 C)  Pulse:  [101] 101  Resp:  [16] 16  BP: (191)/(87) 191/87  Vital Signs: BP 191/87   Pulse 101   Temp 98.2 F (36.8 C)   Resp 16   Ht 4' 11.84" (1.52 m)   Wt 91.5 kg (201 lb 12.8 oz)   SpO2 99%   BMI 39.62 kg/m  Body mass index is 39.62 kg/m.  General: Awake, alert,  NAD  Pulmonary: Normal WOB, no wheezing  Cardiovascular: RRR, no murmurs  Abdominal: soft, NT/ND  Neuro: Nonfocal, moving all extremities symmetrically    Labs:  Lab Results   Component Value Date    NA 138 04/07/2023    K  3.8 04/07/2023    CL 97 (L) 04/07/2023    CO2 30 04/07/2023    BUN 23 (H) 04/07/2023    CREAT 1.3 (H) 04/07/2023    GLUCOSER 100 04/07/2023    ALBUMIN 4.2 04/07/2023    TBILI 0.2 12/03/2022    DBILI < 0.1 02/07/2018    IBIL TNP 02/07/2018    AST 22 12/03/2022    ALT 18 12/03/2022    ALKPHOS 110 12/03/2022     Lab Results   Component Value Date    WBC 6.7 04/07/2023    HCT 34.3 04/07/2023    PLTA 297 04/07/2023       ASA 3    Impression:  GERD, IDA    Medical Decision Making:  The patient will have a diagnostic EGD done. The patient was informed of the risk of bleeding, discomfort, perforation, a need for surgery, infection, drug reaction, cardiovascular and cerebrovascular compromise and the possibility of missing a lesion or problem.  She understood these risks and consented to the exam.     All questions were answered. The patient is in agreement with our plan. The patient will have MAC anesthesia support during their procedure.       Seth Bake, MD, 05/06/2023

## 2023-05-10 ENCOUNTER — Ambulatory Visit (LOCAL_COMMUNITY_HEALTH_CENTER): Payer: No Typology Code available for payment source

## 2023-05-10 ENCOUNTER — Encounter (HOSPITAL_BASED_OUTPATIENT_CLINIC_OR_DEPARTMENT_OTHER): Payer: Self-pay | Admitting: Gastroenterology

## 2023-05-10 LAB — SURGICAL PATH SPECIMEN GASTROINTESTINAL

## 2023-05-13 ENCOUNTER — Ambulatory Visit: Payer: No Typology Code available for payment source

## 2023-05-13 ENCOUNTER — Other Ambulatory Visit: Payer: Self-pay

## 2023-05-13 ENCOUNTER — Ambulatory Visit (HOSPITAL_BASED_OUTPATIENT_CLINIC_OR_DEPARTMENT_OTHER): Payer: No Typology Code available for payment source

## 2023-05-13 DIAGNOSIS — Z972 Presence of dental prosthetic device (complete) (partial): Secondary | ICD-10-CM

## 2023-05-13 DIAGNOSIS — K0889 Other specified disorders of teeth and supporting structures: Secondary | ICD-10-CM | POA: Diagnosis not present

## 2023-05-13 DIAGNOSIS — Z0120 Encounter for dental examination and cleaning without abnormal findings: Secondary | ICD-10-CM | POA: Diagnosis not present

## 2023-05-13 NOTE — Progress Notes (Addendum)
Patient presented for a(n) initial exam with her daughter.    Patient pain level at beginning of appointment:     Interpreter: [place]    *Medical/Dental History*  Chief complaint: My teeth are not good  History of present illness: Patient has existing MX CD that she is unhappy with. She feels it is too loose. Patient has 25-28 remaining and wants to know her options for dentures on the bottom  Med hx reviewed.    *Exam*  Pain: No    Radiographs taken: 1 PA at last visit  -Radiographs taken to evaluate for: Eval for caries and/or pathology  - Radiographic findings: Bone loss, caries approximating pulp on #28    Extraoral exam: Within normal limits  - TMJ exam: WNL  Intraoral exam  - Lips: Within normal limits  - Palate: Within normal limits  - Buccal mucosa: Within normal limits  - Floor of mouth: Within normal limits  - Tongue: Within normal limits  - Pharynx: Within normal limits  - Airway assessment: 1 = full patency  - Oral cancer screening: Within normal limits    Periodontal exam: Generalized recession, moderate horizontal bone loss generalized  Oral hygiene level: Poor    Occlusion Analysis:  - Occlusion-molar classification: Class I  - Overbite: 2  - Overjet: 2mm  - Crowding: None  - Dental midline alignment: Maxillary midline normal and aligned with MD midline    *Assessment*  Assessment: Given that remaining teeth are unilateral with bone loss and caries, patient was told options were CD/CD or CD/MD RPD. RBA discussed. Patient elected to move forward with exo of 25-28 and CD/CD fabrication. Patient was aware that she would be without any teeth on the bottom for 4-6 weeks to allow for healing prior to the new dentures being started. Timeline/sequencing discussed with patient and she understood.     Since patient felt MX CD was too loose denture adhesive tutorial given to patient. Denture care instructions reviewed. Patient was happy with result. Patient was told while new dentures were being  fabricated, to keep using existing MX CD with adhesive.    Plan:  1) Extractions #25,26,27,28 at once  2) Begin CD/CD fabrication in 4-6 weeks    Referral(s) to other provider(s): None    Pain level: 0    Next visit: Simple Extractions [phone]

## 2023-05-19 ENCOUNTER — Ambulatory Visit
Admission: RE | Admit: 2023-05-19 | Discharge: 2023-05-19 | Disposition: A | Discharge status: Home / Self Care | Payer: No Typology Code available for payment source | Source: Ambulatory Visit

## 2023-05-19 ENCOUNTER — Ambulatory Visit: Payer: No Typology Code available for payment source

## 2023-05-19 ENCOUNTER — Other Ambulatory Visit: Payer: Self-pay

## 2023-05-19 ENCOUNTER — Encounter (HOSPITAL_BASED_OUTPATIENT_CLINIC_OR_DEPARTMENT_OTHER): Payer: Self-pay

## 2023-05-19 VITALS — BP 127/61 | HR 109 | Temp 97.8°F | Wt 202.2 lb

## 2023-05-19 DIAGNOSIS — I150 Renovascular hypertension: Secondary | ICD-10-CM

## 2023-05-19 DIAGNOSIS — N1831 Chronic kidney disease, stage 3a: Secondary | ICD-10-CM | POA: Insufficient documentation

## 2023-05-19 DIAGNOSIS — K219 Gastro-esophageal reflux disease without esophagitis: Secondary | ICD-10-CM | POA: Diagnosis present

## 2023-05-19 DIAGNOSIS — N2581 Secondary hyperparathyroidism of renal origin: Secondary | ICD-10-CM

## 2023-05-19 LAB — BASIC METABOLIC PANEL
ANION GAP: 13 mmol/L (ref 10–22)
BUN (UREA NITROGEN): 19 mg/dL — ABNORMAL HIGH (ref 7–18)
CALCIUM: 9.6 mg/dL (ref 8.5–10.5)
CARBON DIOXIDE: 31 mmol/L (ref 21–32)
CHLORIDE: 96 mmol/L — ABNORMAL LOW (ref 98–107)
CREATININE: 1.3 mg/dL — ABNORMAL HIGH (ref 0.4–1.2)
ESTIMATED GLOMERULAR FILT RATE: 45 mL/min — ABNORMAL LOW (ref >60)
Glucose Random: 95 mg/dL (ref 74–160)
POTASSIUM: 3.6 mmol/L (ref 3.5–5.1)
SODIUM: 140 mmol/L (ref 136–145)

## 2023-05-19 LAB — URINALYSIS
BILIRUBIN, URINE: NEGATIVE
GLUCOSE, URINE: NEGATIVE mg/dL
KETONE, URINE: NEGATIVE mg/dL
LEUKOCYTES, URINE: NEGATIVE
NITRITE, URINE: NEGATIVE
OCCULT BLOOD, URINE: NEGATIVE
PH, URINE: 5.5 (ref 5.0–8.0)
PROTEIN, URINE: NEGATIVE mg/dL
SPECIFIC GRAVITY, URINE: 1.007 (ref 1.003–1.035)
UROBILINOGEN, URINE: 0.2 U/dL (ref 0.2–1.0)

## 2023-05-19 LAB — POC URINALYSIS
BILIRUBIN, URINE: NEGATIVE
GLUCOSE,URINE: NEGATIVE
KETONE, URINE: NEGATIVE
LEUKOCYTE ESTERASE: NEGATIVE
NITRITE, URINE: NEGATIVE
PH URINE: 5.5 (ref 5.0–8.0)
PROTEIN, URINE: NEGATIVE
SPECIFIC GRAVITY, URINE: 1.025 (ref 1.003–1.030)
UROBILINOGEN URINE: 0.2 (ref 0.2–1.0)

## 2023-05-19 LAB — PROTEIN/CREAT URINE RANDOM
CREATININE RANDOM URINE: 30 mg/dL (ref 28–217)
TOTAL PROTEIN RANDOM URINE: <6 mg/dL (ref 0–15)

## 2023-05-19 LAB — MICROALBUMIN RANDOM URINE: ALBUMIN URINE RANDOM: <1.2 mg/dL (ref 0.0–1.9)

## 2023-05-19 MED ORDER — TORSEMIDE 10 MG PO TABS
10.0000 mg | ORAL_TABLET | Freq: Every day | ORAL | 0 refills | Status: DC
Start: 2023-05-19 — End: 2023-08-25

## 2023-05-19 MED ORDER — OMEPRAZOLE 40 MG PO CPDR
40.0000 mg | DELAYED_RELEASE_CAPSULE | Freq: Every day | ORAL | 0 refills | Status: DC
Start: 2023-05-19 — End: 2023-08-26

## 2023-05-19 NOTE — Progress Notes (Signed)
Nephrology and Hypertension Follow-up Note  Reason for visit: This is a 67 year old year-old female here for follow up of CKD, bilateral renal artery stenosis. She has a PMHx of bilateral renal artery stenosis, hypertension, hyperlipidemia, GERD, fibromyalgia.     Past pertinent renal history:    Baseline creatinine: 1.1-1.3     Patient was first diagnosed with HTN: age 33. Does check her blood pressure at home. Average readings: Variable.  SBP ranges anywhere from 80-1 50s. Current BP meds: Chlorthalidone 25 mg daily, amlodipine-valsartan 5-320 mg once daily.  She had previously been on spironolactone, however this was discontinued due to low blood pressures and elevated heart rate.     Seen by Dr. Wellington Hampshire, vascular surgery, on 06/24/20:  "Comment: Reviewing her duplex imaging studies, she appears to have severe parenchymal disease that would not be improved with renal artery revascularization.  Plan: Based on the CORAL and ASTRAL trials, this medical management is paramount for her as revascularization would only run the risks of the procedure with no added benefit.  Considering her renal function is relatively normal, further intervention would not be deemed necessary.  However, if her renal function were to deteriorate with significant hypertension, then a last resort procedure may be in order."    Interim:  - Last seen in renal clinic on 04/07/23    - Current BP meds: amlodipine-valsartan (exforge) 5-320 mg daily, torsemide 5 mg daily, spironolactone 50 mg daily, chlorthalidone 25 mg daily. She states that her BP was markedly elevated (SBP 180s) at her last dental visit.     -Reports continued lower extremity swelling, has not noticed much difference after starting the 5 mg torsemide.  Also reports "bone pain," she takes Tylenol over-the-counter but states that this is not helping.  She has pain in her knees and low back.      Most Recent BP Reading(s)  05/19/23 : 127/61  05/13/23 : 127/77  05/06/23 :  191/87    Most Recent Weight Reading(s)  05/19/23 : 91.7 kg (202 lb 3.2 oz)  05/06/23 : 91.5 kg (201 lb 12.8 oz)  04/29/23 : 91.2 kg (201 lb)      Medications:    Current Outpatient Medications   Medication Instructions    amlodipine-valsartan (EXFORGE) 5-320 MG per tablet 1 tablet, Oral, DAILY    atorvastatin (LIPITOR) 80 mg, Oral, DAILY    Blood Pressure KIT 1 kit, Does not apply, DAILY BEFORE BREAKFAST, Regular ICD i10    budesonide-formoterol (SYMBICORT) 160-4.5 MCG/ACT inhaler 2 puffs, Inhalation, 2 TIMES DAILY, Use 1 puff prn during the day. Rinse mouth after use.    calcium citrate-citamin D (CALCIUM CITRATE +) 315-5 MG-MCG tablet 1 tablet, Oral, 2 TIMES DAILY    chlorthalidone (HYGROTEN) 25 mg, Oral, DAILY    cholecalciferol (VITAMIN D3) 2,000 Units, Oral, 2 TIMES DAILY    clonazePAM (KLONOPIN) 1 MG tablet 1 tablet two times daily, PRN    DULoxetine (CYMBALTA) 60 mg, Oral, DAILY    DULoxetine (CYMBALTA) 30 mg, Oral, DAILY, . Take together with duloxetine 60 mg daily for total dose of 90 mg daily.    ferrous sulfate 325 mg, Oral, EVERY OTHER DAY    gabapentin (NEURONTIN) 100 mg, Oral, 3 TIMES DAILY    latanoprost (XALATAN) 0.005 % ophthalmic solution 1 drop, Both Eyes, NIGHTLY    levothyroxine (SYNTHROID) 50 mcg, Oral, EVERY MORNING BEFORE BREAKFAST    meclizine (ANTIVERT) 25 mg, Oral, EVERY 8 HOURS PRN    omeprazole (PRILOSEC) 40  mg, Oral, DAILY    OTHER MEDICATION 1 each, Does not apply, DAILY, Blood Pressure Monitoring Kit   Arm Circumference LARGE  For use as directed: "Please Dispense AMA validated cuff see [email or web address] (such as Omron)"       DX: hypertension  ICD10: I10    spironolactone (ALDACTONE) 50 mg, Oral, DAILY, STOP ATENOLOL, START THIS MEDICATION    torsemide (DEMADEX) 10 mg, Oral, DAILY            Objective :  Physical Exam Vitals:BP 127/61 (Site: RA, Position: Sitting, Cuff Size: Lrg)   Pulse 109   Temp 97.8 F (36.6 C) (Temporal)   Wt 91.7 kg (202 lb 3.2 oz)   SpO2 98%   BMI  39.70 kg/m    Constitutional: No acute distress.  Eyes: Anicteric sclerae. No conjunctival pallor. No lid-lag. Pupils are equal and round  Lungs: Clear to auscultation bilaterally. Normal respiratory effort with no intercostal retractions.  CV: Normal S1 S2. Heart rate and rhythm regular.  Extremities: 1-2+ LE edema..     Labs  Lab Results   Component Value Date    NA 140 05/19/2023    K 3.6 05/19/2023    CO2 31 05/19/2023    CREAT 1.3 (H) 05/19/2023    GFR 45 (L) 05/19/2023       Lab Results   Component Value Date    PHOS 3.4 04/07/2023    CA 9.6 05/19/2023    PTHINTACTWC 102 (H) 04/07/2023    VITD25H 30 04/07/2023        Lab Results   Component Value Date    HGB 10.8 (L) 04/07/2023        ALB/CREAT RATIO URINE RAN (ug/mg)   Date Value   05/19/2023 TNP   04/07/2023 37 (H)   12/03/2022 35 (H)       Duplex Renal Arteries- 01/19/23  Conclusions:  Right - The renal artery has evidence of 60-99% stenosis. RI values are elevated indicating intrinsic disease. Kidney length is normal. Renal vein is patent.  Left - The renal artery has evidence of 60-99% stenosis. RI values are elevated indicating intrinsic disease. Kidney length is normal. Renal vein is patent.  Note: SMA has evidence of elevated velocities indicating greater than 70% stenosis.    Assessment and Plan    Proteinuric CKD IIIa: Likely due to longstanding difficult to control hypertension in the setting of bilateral renal artery stenosis.  We discussed the importance of good blood pressure control to prevent the progression of CKD.  Recommend low-salt diet, less than 2 g/day.  Continue ARB for antiproteinuric effect.  Labs today show kidney function at baseline and improvement in proteinuria.   Given continued leg swelling will increase torsemide to 10 mg daily and recheck labs in 4 weeks.     #BP/Volume management:  127/61  on current regimen overall er office readings, though she states when she os nervous her blood pressure goes up, such as when she was at  the dentist.  -Advised pt to check BPs at home and keep a log.   -Goal: < 130/80  -Continue amlodipine-valsartan (exforge) 5-320 mg daily, spironolactone 50 mg daily, chlorthalidone 25 mg daily.  - Will increase torsemide to 10 mg daily in the setting of worsening LE edema.    #Anemia of CKD:   -Hgb 10.8  -was started on ferrous sulfate 325 mg every other day at last visit, should continue     #Mineral Bone Disease:   - Continue cholecalciferol  2000 units daily    #Electrolytes/Acid-base:  Lytes in good order.      Patient to return in: 4 weeks, sooner     No orders of the defined types were placed in this encounter.    There are no Patient Instructions on file for this visit.      Thank you for involving me in the care of this patient. Please do not hesitate to reach out with any questions.    Ilona Sorrel, DO    Pager ID: [phone]    [facility]  [address]  [place], [place] [address]  [phone]

## 2023-05-24 ENCOUNTER — Telehealth (HOSPITAL_BASED_OUTPATIENT_CLINIC_OR_DEPARTMENT_OTHER): Payer: Self-pay | Admitting: Ambulatory Care

## 2023-05-24 ENCOUNTER — Other Ambulatory Visit (HOSPITAL_BASED_OUTPATIENT_CLINIC_OR_DEPARTMENT_OTHER): Payer: Self-pay

## 2023-05-24 DIAGNOSIS — F330 Major depressive disorder, recurrent, mild: Secondary | ICD-10-CM

## 2023-05-24 NOTE — Telephone Encounter (Signed)
Attempted x 1 - patient picked up and says wrong number.  Attempted  X2 and call went to voicemail.  Message left.  [name], MD  [facility] Nurses [facility]  Dear RN,    Please:    1. Create Telephone encounter for this patient.  2. Share with the patient the attached results.  Please let the patient know her lab results show that her kidney function is stable and the repeat urine test was negative for blood. Please provide the creatinine (measure of kidney function) as well as any other requested results    Plan:  1. No change in plan.    2. Type of Outreach: 1 phone call and if unable to reach send letter    3. Document the conversation in the Telephone Encounter and close the encounter, no need to send back to me.    Thank you,  Sara Snyder

## 2023-05-24 NOTE — Telephone Encounter (Signed)
PER Pharmacy, Sara Snyder is a 67 year old female has requested a refill of duloxetine 30 mg capsule,delayed release .      Last OFFICE/TELE Visit:  04/29/23 with Petrowitz      Last Physical Exam:  06/13/2020     There are no preventive care reminders to display for this patient.    Other Med Adult:  Most Recent BP Reading(s)  05/19/23 : 127/61        Cholesterol (mg/dL)   Date Value   76/16/0737 215     LOW DENSITY LIPOPROTEIN DIRECT (mg/dL)   Date Value   10/62/6948 153     HIGH DENSITY LIPOPROTEIN (mg/dL)   Date Value   54/62/7035 44     TRIGLYCERIDES (mg/dL)   Date Value   00/93/8182 148         THYROID SCREEN TSH REFLEX FT4 (uIU/mL)   Date Value   05/01/2022 3.320         TSH (THYROID STIM HORMONE) (uIU/mL)   Date Value   01/14/2021 7.090 (H)       HEMOGLOBIN A1C (%)   Date Value   12/03/2022 6.9 (H)       No results found for: "POCA1C"      No results found for: "INR"    SODIUM (mmol/L)   Date Value   05/19/2023 140       POTASSIUM (mmol/L)   Date Value   05/19/2023 3.6           CREATININE (mg/dL)   Date Value   99/37/1696 1.3 (H)       Documented patient preferred pharmacies:    [facility] [place], [place] - [address]  Phone: [phone] Fax: [phone]

## 2023-05-31 NOTE — Progress Notes (Unsigned)
Ulcerated vulvar lesion papule Right labia majora     She has felt it for 1 year   Is more like a lump  Wondering if due to shaving    Last month went to PCP the lump was more swollen had had a little bleeding from it  Has gone down a little   No itching no other sx        Now can't really feel it     If recurrs call for ASAP appt

## 2023-06-01 ENCOUNTER — Other Ambulatory Visit (HOSPITAL_BASED_OUTPATIENT_CLINIC_OR_DEPARTMENT_OTHER): Payer: No Typology Code available for payment source

## 2023-06-01 ENCOUNTER — Ambulatory Visit (HOSPITAL_BASED_OUTPATIENT_CLINIC_OR_DEPARTMENT_OTHER): Payer: No Typology Code available for payment source

## 2023-06-01 ENCOUNTER — Ambulatory Visit
Payer: No Typology Code available for payment source | Attending: Obstetrics & Gynecology | Admitting: Obstetrics & Gynecology

## 2023-06-01 ENCOUNTER — Encounter (HOSPITAL_BASED_OUTPATIENT_CLINIC_OR_DEPARTMENT_OTHER): Payer: Self-pay | Admitting: Obstetrics & Gynecology

## 2023-06-01 ENCOUNTER — Other Ambulatory Visit: Payer: Self-pay

## 2023-06-01 VITALS — BP 95/58 | HR 114 | Wt 202.0 lb

## 2023-06-01 DIAGNOSIS — N9089 Other specified noninflammatory disorders of vulva and perineum: Secondary | ICD-10-CM | POA: Diagnosis present

## 2023-06-02 NOTE — Telephone Encounter (Signed)
Letter sent.

## 2023-06-07 ENCOUNTER — Ambulatory Visit (HOSPITAL_BASED_OUTPATIENT_CLINIC_OR_DEPARTMENT_OTHER): Payer: No Typology Code available for payment source

## 2023-06-08 ENCOUNTER — Encounter (HOSPITAL_BASED_OUTPATIENT_CLINIC_OR_DEPARTMENT_OTHER): Payer: Self-pay

## 2023-06-08 ENCOUNTER — Other Ambulatory Visit (HOSPITAL_BASED_OUTPATIENT_CLINIC_OR_DEPARTMENT_OTHER): Payer: Self-pay

## 2023-06-08 NOTE — Telephone Encounter (Signed)
Scheduled  for a colonoscopy for IDA on 06/24/23.Procedure briefly explained to patient but request I call her daughter as she is forgetful.Daughter called no answer.Left  brief vm message  about procedure,prep-sd1-4 pm to bedtime,+ride.Chart reviewed for  GLP-1+SGLT-2 meds, none noted.Laxative ordered.Instructions sent to My Chart.

## 2023-06-09 MED ORDER — PEG 3350-KCL-NABCB-NACL-NASULF 236 G PO SOLR
4.0000 L | ORAL | 0 refills | Status: DC
Start: 2023-06-09 — End: 2023-07-06

## 2023-06-09 MED ORDER — SIMETHICONE 80 MG PO TABS
4.0000 | ORAL_TABLET | ORAL | 0 refills | Status: DC
Start: 2023-06-09 — End: 2023-07-06

## 2023-06-10 ENCOUNTER — Other Ambulatory Visit (HOSPITAL_BASED_OUTPATIENT_CLINIC_OR_DEPARTMENT_OTHER): Payer: Self-pay

## 2023-06-10 ENCOUNTER — Ambulatory Visit: Payer: No Typology Code available for payment source

## 2023-06-10 ENCOUNTER — Other Ambulatory Visit: Payer: Self-pay

## 2023-06-10 ENCOUNTER — Ambulatory Visit (HOSPITAL_BASED_OUTPATIENT_CLINIC_OR_DEPARTMENT_OTHER)
Admission: RE | Admit: 2023-06-10 | Discharge: 2023-06-10 | Disposition: A | Discharge status: Home / Self Care | Payer: No Typology Code available for payment source | Source: Ambulatory Visit

## 2023-06-10 ENCOUNTER — Encounter (HOSPITAL_BASED_OUTPATIENT_CLINIC_OR_DEPARTMENT_OTHER): Payer: Self-pay

## 2023-06-10 ENCOUNTER — Ambulatory Visit
Admission: RE | Admit: 2023-06-10 | Discharge: 2023-06-10 | Disposition: A | Discharge status: Home / Self Care | Payer: No Typology Code available for payment source | Attending: Internal Medicine | Admitting: Internal Medicine

## 2023-06-10 DIAGNOSIS — R928 Other abnormal and inconclusive findings on diagnostic imaging of breast: Secondary | ICD-10-CM | POA: Insufficient documentation

## 2023-06-15 ENCOUNTER — Encounter (HOSPITAL_BASED_OUTPATIENT_CLINIC_OR_DEPARTMENT_OTHER): Payer: Self-pay

## 2023-06-15 DIAGNOSIS — I10 Essential (primary) hypertension: Secondary | ICD-10-CM

## 2023-06-15 MED ORDER — AMLODIPINE BESYLATE-VALSARTAN 5-320 MG PO TABS
1.0000 | ORAL_TABLET | Freq: Every day | ORAL | 11 refills | Status: DC
Start: 2023-06-15 — End: 2023-08-30

## 2023-06-15 NOTE — Telephone Encounter (Signed)
 PER Patient (self), Sara Snyder is a 67 year old female has requested a refill of Amlodipine-Valsartan.      Last OFFICE/TELE Visit:  04/29/2023 with Petrowitz, D      Last Physical Exam:   06/13/2020     There are no preventive care reminders to display for this patient.    HTN Med:    Most Recent BP Reading(s)  06/01/23 : 95/58  05/19/23 : 127/61  05/13/23 : 127/77      Documented patient preferred pharmacies:    [facility] [place], [place] - [address]  Phone: [phone] Fax: [phone]

## 2023-06-15 NOTE — Telephone Encounter (Signed)
 [place] Staff,    Please schedule "follow-up chronic conditions" with PCP in 07/2023.     Please document 3 outreaches in a telephone encounter.     Thank you,  Duwayne Heck

## 2023-06-24 ENCOUNTER — Other Ambulatory Visit (HOSPITAL_BASED_OUTPATIENT_CLINIC_OR_DEPARTMENT_OTHER): Payer: No Typology Code available for payment source

## 2023-06-30 ENCOUNTER — Ambulatory Visit: Payer: No Typology Code available for payment source | Attending: Psychiatry

## 2023-06-30 DIAGNOSIS — F411 Generalized anxiety disorder: Secondary | ICD-10-CM | POA: Insufficient documentation

## 2023-06-30 DIAGNOSIS — F321 Major depressive disorder, single episode, moderate: Secondary | ICD-10-CM | POA: Insufficient documentation

## 2023-06-30 NOTE — Progress Notes (Unsigned)
ADULT OUTPATIENT PSYCHIATRY (OPD and PCBHI)  THERAPY FOLLOW UP     BH TREATMENT TEAM:  [place]    CHIEF COMPLAINT:   "I am well, calm, forgetful."    INTERVAL HISTORY:   Ms. Buckley likes to sleep and be in her bedroom alone, doesn't trust others, gets irritable when people don't acknowledge her reality/needs.  She likes to watch TV on [place] soap operas.  She is obese, wants to loose weight.  Reports her memory declined, increased anxiety and sadness.  She is trying to take better of her health, has high blood pressure and dizziness, fibromyalgia and obesity.    CURRENT MEDICATIONS:    Current Outpatient Medications   Medication Sig    polyethylene glycol (GOLYTELY) 236 g suspension Take 4,000 mLs by mouth See Admin Instructions Take as directed prior to your colonoscopy    Simethicone 80 MG TABS Take 4 tablets by mouth See Admin Instructions Take as directed prior to colonoscopy.    amlodipine-valsartan (EXFORGE) 5-320 MG per tablet Take 1 tablet by mouth daily    DULoxetine (CYMBALTA) 30 MG capsule Take 1 capsule by mouth daily . Take together with duloxetine 60 mg daily for total dose of 90 mg daily.    omeprazole (PRILOSEC) 40 MG capsule Take 1 capsule by mouth daily    torsemide (DEMADEX) 10 MG tablet Take 1 tablet by mouth daily    ferrous sulfate 325 (65 FE) MG tablet Take 1 tablet by mouth every other day    meclizine (ANTIVERT) 25 MG TABS Take 1 tablet by mouth every 8 (eight) hours as needed    cholecalciferol (VITAMIN D3) 2000 UNIT tablet Take 1 tablet by mouth in the morning and 1 tablet before bedtime.    calcium citrate-citamin D (CALCIUM CITRATE +) 315-5 MG-MCG tablet Take 1 tablet by mouth in the morning and 1 tablet before bedtime.    OTHER MEDICATION Use 1 each As Directed daily Blood Pressure Monitoring Kit   Arm Circumference LARGE  For use as directed: "Please Dispense AMA validated cuff see [email or web address] (such as Omron)"       DX: hypertension  ICD10: I10    Blood Pressure KIT Use 1  kit As Directed daily before breakfast Regular ICD i10    DULoxetine (CYMBALTA) 60 MG capsule Take 1 capsule by mouth in the morning.    spironolactone (ALDACTONE) 50 MG tablet Take 1 tablet by mouth in the morning. STOP ATENOLOL, START THIS MEDICATION.    atorvastatin (LIPITOR) 80 MG tablet Take 1 tablet by mouth in the morning.    clonazePAM (KLONOPIN) 1 MG tablet 1 tablet two times daily, PRN    levothyroxine (SYNTHROID) 50 MCG tablet Take 1 tablet by mouth every morning before breakfast    chlorthalidone (HYGROTEN) 25 MG tablet Take 1 tablet by mouth in the morning.    gabapentin (NEURONTIN) 100 MG capsule Take 1 capsule by mouth in the morning and 1 capsule at noon and 1 capsule before bedtime.    budesonide-formoterol (SYMBICORT) 160-4.5 MCG/ACT inhaler Inhale 2 puffs into the lungs in the morning and 2 puffs before bedtime. Use 1 puff prn during the day. Rinse mouth after use..    latanoprost (XALATAN) 0.005 % ophthalmic solution Place 1 drop into both eyes nightly     No current facility-administered medications for this visit.         LANGUAGE OF CARE:    [place] ([place]      LANGUAGE NEEDS MET:    Provider/Staff  Proficient in Patients Language (not [place])    CURRENT TREATMENT/CONTACT INFO FOR OTHER AGENCIES AND MENTAL HEALTH PROVIDERS (if applicable):     Contact/Community Support    No documentation.                   COLUMBIA RISK ASSESSMENTS:     Suicide:  [facility] OP LAST CONTACT      Flowsheet Row BH Televisit from 06/30/2023 in [facility]   Have you wished you were dead or wished you could go to sleep and not wake up? No   Have you actually had any thoughts of killing yourself?  No   Have you done anything, started to do anything, or prepared to do anything to end your life? No   C-SSRS OP Last Contact Screener Risk Score No Risk           C-SSRS Risk Assessment    No documentation.                   VIOLENCE:    Violence/Abuse Risk    No documentation.                    ADDITIONAL SCREENINGS:       12/03/2022 11/06/2021 01/14/2021   PHQ-9 Screening   Patient refused PHQ-9  No No   Anhedonia 3  1    0  2   Low mood/hopelessness 3  1    0  3   Sleep disturbance 1  0 3   Fatigue 1  1 3    Appetite disturbance 0  0 0   Low self-worth 0  0 0   Impaired concentration 0  0 0   Psychomotor retardation/agitation 0  0 0   Suicidal ideation 0  0 0   PHQ-9 TOTAL 8 3 11    Problem difficulty  Not difficult at all Somewhat difficult       Patient-reported    Proxy-reported    Multiple values from one day are sorted in reverse-chronological order            12/03/2022 11/06/2021 06/13/2020   GAD-7 Screening   Nervousness 3  1    1  1    Uncontrollable worry 3  0    0  1       Patient-reported    Proxy-reported    Multiple values from one day are sorted in reverse-chronological order           12/03/2022 11/06/2021 06/13/2020   ALCOHOL/DRUG SCREENING   Frequency of 4+ drinks/day past year (Over 65) 0  0    0  0   Frequency of drug use past year 0  0    0  0       Patient-reported    Proxy-reported    Multiple values from one day are sorted in reverse-chronological order           11/17/2017   ALCOHOL (AUDIT) Screening   Frequency of 4+ drinks/day 0            12/03/2022 11/06/2021 06/13/2020   DRUG (DAST) Screening   Drug use frequency 0  0    0  0       Patient-reported    Proxy-reported    Multiple values from one day are sorted in reverse-chronological order         12/03/2022   SDOH Screening   Patient willing to answer questions Yes  Worry food will run out Never    Food won't last Never    Threatend to shut utilities No    Trouble getting transportation No    Living situation today I have a steady place to live.    Internet access Yes, on a computer,Yes, on my phone    Experienced violence No    OK for [place] to reach out No    Referral to community programs No        Patient-reported       MENTAL STATUS EXAMINATION:   Mental Status Exam - 07/06/23 1456          Mental Status Exam     General  Appearance Clean     Behavior Cooperative     Level of Consciousness Alert     Orientation Level Grossly intact     Attention/Concentration Fluctuating     Mannerisms/Movements No abnormal mannerisms/movements     Speech Quality and Rate WNL     Speech Clarity Clear     Speech Tone Normal vocal inflection     Vocabulary/Fund of Knowledge WNL     Memory Notable gaps in memory     Thought Process & Associations Goal-directed     Dissociative Symptoms None     Thought Content No abnormalities reported or observed     Hallucinations None     Suicidal Thoughts None     Homicidal Thoughts None     Mood Irritable;Anxious;Depressed/Sad     Affect Congruent with mood     Judgment Fair     Insight Fair                     RISK ASSESSMENTS:       Violence: low (1)     Addiction: low (1)     ASSESSMENT:    In this session, ct practiced deep breathing as a tool to calm down, agreed to practice it as needed.  Client has 4 daughters a and a grandson who care for her.     DIAGNOSES:    GAD  MDD, moderate    PLAN:     Psychotherapy - Marshia Ly, PhD, LICSW - This is our last of 2 sessions because the provider is retiring, she is requesting another therapist in [place].  Psychopharm Marcha Dutton, APRN  Urgent Care at [facility] or [place], daughter [phone]  Practice deep breathing regularly.    REVIEWING TODAY'S VISIT:    Reviewing Today's Visit:  Clinical Interventions Today: Cognitive Behavioral Therapy (CBT)  Patient's Response to Interventions: Responsive  Time Spent in Psychotherapy: 45 minutes            CARE PLAN/ EPISODES:  No linked episodes    Patient/Guardian:       Strengths/Skills:      Potential Barriers:      Patient stated goals:      Problem 1:      Short Term Goals:    Short Term Target:     Short TermTarget Date:    Short Term Goal #1 Progress:       Long Term Goals:    Long Term Target:     Long TermTarget Date:      Long Term Goal #1 Progress:       Intervention #1:    Intervention Frequency:      Intervention Duration:     Intervention Responsibility:  Dolores Lory, PhD

## 2023-07-05 ENCOUNTER — Telehealth (HOSPITAL_BASED_OUTPATIENT_CLINIC_OR_DEPARTMENT_OTHER): Payer: Self-pay

## 2023-07-05 NOTE — Telephone Encounter (Signed)
1sst outreach attempt  Lvm to schedule follow up for refill   Reason: f/u chronic conditions      [place], 07/05/2023

## 2023-07-06 ENCOUNTER — Encounter (HOSPITAL_BASED_OUTPATIENT_CLINIC_OR_DEPARTMENT_OTHER): Payer: Self-pay

## 2023-07-06 ENCOUNTER — Telehealth (HOSPITAL_BASED_OUTPATIENT_CLINIC_OR_DEPARTMENT_OTHER): Payer: Self-pay

## 2023-07-06 MED ORDER — SIMETHICONE 80 MG PO TABS
4.0000 | ORAL_TABLET | ORAL | 0 refills | Status: DC
Start: 2023-07-06 — End: 2023-09-14

## 2023-07-06 MED ORDER — PEG 3350-KCL-NABCB-NACL-NASULF 236 G PO SOLR
4.0000 L | ORAL | 0 refills | Status: DC
Start: 2023-07-06 — End: 2023-09-14

## 2023-07-06 NOTE — Progress Notes (Signed)
Colonoscopy(Golytely) SD1    Call to primary number unanswered. Call to [phone] answered by daughter Sara Snyder. [place] translator deferred. Patient's procedure for 07/20/23. Arrival time at 1045. Location: [facility].  Ride home requirement reviewed.  Answered any questions  about the procedure. Patient asked to call [phone] with any further questions. Colonoscopy(Golytely) SD1 procedure instructions reviewed and placed into patient's MyChart. Procedure laxative pend order to preferred pharmacy. Confirmed patient is not on anticoagulants GLP-1, or SGLT-2 diabetes medications.

## 2023-07-09 ENCOUNTER — Ambulatory Visit (HOSPITAL_BASED_OUTPATIENT_CLINIC_OR_DEPARTMENT_OTHER): Payer: No Typology Code available for payment source

## 2023-07-13 ENCOUNTER — Encounter (HOSPITAL_BASED_OUTPATIENT_CLINIC_OR_DEPARTMENT_OTHER): Payer: Self-pay

## 2023-07-13 NOTE — Progress Notes (Signed)
lvm

## 2023-07-15 ENCOUNTER — Ambulatory Visit: Payer: No Typology Code available for payment source

## 2023-07-15 ENCOUNTER — Other Ambulatory Visit: Payer: Self-pay

## 2023-07-15 VITALS — BP 163/74 | HR 101

## 2023-07-15 DIAGNOSIS — K0530 Chronic periodontitis, unspecified: Secondary | ICD-10-CM | POA: Diagnosis not present

## 2023-07-15 NOTE — Progress Notes (Addendum)
Patient presented for extraction of #28,27,26,25 and associated Alveolaplasty. Reviewed medical history. No contraindications for dental treatment. Patient reviewed and signed consent form.    Patient pain level at beginning of appointment: 0    Interpreter: None    *Medical/Dental History*  Chief complaint: None  History of present illness: NA  Medical history reviewed and upated.    *Extraction*  Reason for extraction: Periodontal involvement, Pre-prosthetic   Radiographs taken: NA  Radiographs taken to evaluate for: NA  Radiographic exam: Reviewed Pas taken previously, noted significant horizontal bone loss    Pre-op BP: 163/74  Pre-op Pulse: 101  Temperature: NA    **TIMEOUT**  Date: 07/15/23  Time: 10:15AM  Name/DOB confirmed: Yes  Tooth # confirmed: Yes      Local anesthetic used: 4% articaine with 1:100k epi  Whole number of carpules used: 1  Type of injection: mandibular infiltration    Type of extraction: Simple  Elevators used: spade  Forceps used: lower ash  Surgifoam placed: No  Flap raised: No  Sutures placed: Yes    Extraction(s) completed. Irrigated with saline. Extraction socket gently curetted. Bony protuberances and spicules removed with rongeurs and rinsed thoroughly with copious amounts of sterile saline. Sterile gauze and pressure applied, hemostasis achieved.    *Assessment*  Prescription given: None  Referral: None  Assessment/Plan: Patient will return for post-op follow up, and then will begin fabrication of mand and mx CDs.    Patient pain level at end of appointment: 0    Patient left ambulatory, satisfied with treatment. Any remaining planned treatments were explained in detail to the patient.    Assistant: Velvet Bathe  Next visit: What: Post-op follow up  Reason: exts    NV: Post Op Follow up  NNV: Start Complete dentures (Conventional)

## 2023-07-19 ENCOUNTER — Ambulatory Visit (HOSPITAL_BASED_OUTPATIENT_CLINIC_OR_DEPARTMENT_OTHER): Payer: No Typology Code available for payment source

## 2023-07-20 ENCOUNTER — Encounter (HOSPITAL_BASED_OUTPATIENT_CLINIC_OR_DEPARTMENT_OTHER): Payer: Self-pay | Admitting: Anesthesiology

## 2023-07-20 ENCOUNTER — Encounter (HOSPITAL_BASED_OUTPATIENT_CLINIC_OR_DEPARTMENT_OTHER): Payer: Self-pay

## 2023-07-20 ENCOUNTER — Ambulatory Visit (HOSPITAL_BASED_OUTPATIENT_CLINIC_OR_DEPARTMENT_OTHER): Payer: Self-pay | Admitting: Anesthesiology

## 2023-07-20 ENCOUNTER — Other Ambulatory Visit: Payer: Self-pay

## 2023-07-20 ENCOUNTER — Ambulatory Visit
Admission: RE | Admit: 2023-07-20 | Discharge: 2023-07-20 | Disposition: A | Discharge status: Home / Self Care | Payer: No Typology Code available for payment source | Attending: Gastroenterology | Admitting: Gastroenterology

## 2023-07-20 DIAGNOSIS — D120 Benign neoplasm of cecum: Secondary | ICD-10-CM | POA: Insufficient documentation

## 2023-07-20 DIAGNOSIS — D128 Benign neoplasm of rectum: Secondary | ICD-10-CM | POA: Diagnosis present

## 2023-07-20 DIAGNOSIS — K640 First degree hemorrhoids: Secondary | ICD-10-CM | POA: Diagnosis present

## 2023-07-20 DIAGNOSIS — K5520 Angiodysplasia of colon without hemorrhage: Secondary | ICD-10-CM | POA: Diagnosis present

## 2023-07-20 DIAGNOSIS — D122 Benign neoplasm of ascending colon: Secondary | ICD-10-CM | POA: Diagnosis present

## 2023-07-20 DIAGNOSIS — D125 Benign neoplasm of sigmoid colon: Secondary | ICD-10-CM | POA: Insufficient documentation

## 2023-07-20 DIAGNOSIS — D509 Iron deficiency anemia, unspecified: Secondary | ICD-10-CM | POA: Diagnosis present

## 2023-07-20 MED ORDER — PROPOFOL INFUSION
INTRAVENOUS | Status: AC
Start: 2023-07-20 — End: 2023-07-20
  Filled 2023-07-20: qty 50

## 2023-07-20 MED ORDER — LIDOCAINE HCL (PF) 2 % IJ SOLN
Freq: Once | INTRAMUSCULAR | Status: DC | PRN
Start: 2023-07-20 — End: 2023-07-20
  Administered 2023-07-20: 100 mg via INTRAVENOUS

## 2023-07-20 MED ORDER — LIDOCAINE HCL (PF) 2 % IJ SOLN
INTRAMUSCULAR | Status: AC
Start: 2023-07-20 — End: 2023-07-20
  Filled 2023-07-20: qty 5

## 2023-07-20 MED ORDER — PROPOFOL 200 MG/20ML IV EMUL
Freq: Once | INTRAVENOUS | Status: DC | PRN
Start: 2023-07-20 — End: 2023-07-20
  Administered 2023-07-20 (×2): 100 mg via INTRAVENOUS
  Administered 2023-07-20: 50 mg via INTRAVENOUS

## 2023-07-20 NOTE — PROVATION-GI (Signed)
[facility]  Patient Name: Sara Snyder  MRN: [id number]  CSN: [id number]  Date of Birth: 08/25/1955  Admit Type: Outpatient  Age: 68  Gender: Female  Note Status: Finalized  Patient Location: [facility]  Referring MD:          Edsel Petrin MD, MD  Procedure Date:        07/20/2023 1:09:50 PM  Procedure:             Colonoscopy  Endoscopist:           Lelon Huh MD, MD  Indications for Procedure:       Iron deficiency anemia  Comorbidities          OA  Medications:           Monitored Anesthesia Care  Procedure:       Just prior to the procedure, an updated history and physical was done. I        obtained an informed consent from the patient reviewing the risk of the        procedure including (but not limited to) respiratory depression, perforation,        bleeding, discomfort, a possible need for surgery and unexpected reactions to        medications. The patient is aware that test has limitations and may not        detect significant lesions such as cancer or other potential diseases. The        patient was also informed that they might need a repeat colonoscopy earlier        than standard guidelines if there are changes in their symptoms or concerning        findings noted. A time out was performed with the entire procedure staff        present. The scope was passed under direct vision. Throughout the procedure,        the patient's blood pressure, pulse, and oxygen saturations were monitored        continuously. The Colonoscope was introduced through the anus and advanced to        the cecum, identified by appendiceal orifice and ileocecal valve. The scope        was then slowly withdrawn with confirmation of the noted findings. The        colonoscopy was performed without difficulty. The patient tolerated the        procedure well. The quality of the bowel preparation was adequate. The        quality of the bowel preparation was evaluated using the BBPS [facility] Bowel        Preparation  Scale) with scores of: Right Colon = 2 (minor amount of residual        staining, small fragments of stool and/or opaque liquid, but mucosa seen        well), Transverse Colon = 2 (minor amount of residual staining, small        fragments of stool and/or opaque liquid, but mucosa seen well) and Left Colon        = 3 (entire mucosa seen well with no residual staining, small fragments of        stool or opaque liquid). The total BBPS score equals 7. Scope withdrawal time        was 16 minutes.  Findings:       The perianal and digital rectal examinations were normal.       A single angioectasia  without bleeding was found in the distal ascending        colon.       A 2 mm polyp was found in the cecum. The polyp was sessile. The polyp was        removed with a cold snare. Resection and retrieval were complete.       A 4 mm polyp was found in the ascending colon. The polyp was sessile. The        polyp was removed with a cold snare. Resection and retrieval were complete.       Three sessile polyps were found in the sigmoid colon. The polyps were 2 mm in        size. These polyps were removed with a cold snare. Resection and retrieval        were complete.       A 2 mm polyp was found in the rectum. The polyp was sessile. The polyp was        removed with a cold snare. Resection and retrieval were complete.       Internal hemorrhoids were found during retroflexion. The hemorrhoids were        Grade I (internal hemorrhoids that do not prolapse).       No additional abnormalities were found on retroflexion.  Scope Withdrawal Time: 0 hours 16 minutes 17 seconds   Total Procedure Duration: 0 hours 21 minutes 58 seconds   Post Procedure Diagnosis:       - A single non-bleeding colonic angioectasia.       - One 2 mm polyp in the cecum, removed with a cold snare. Resected and        retrieved.       - One 4 mm polyp in the ascending colon, removed with a cold snare. Resected        and retrieved.       - Three 2 mm polyps in the  sigmoid colon, removed with a cold snare. Resected        and retrieved.       - One 2 mm polyp in the rectum, removed with a cold snare. Resected and        retrieved.       - Internal hemorrhoids.  Complications:         No immediate complications.  Estimated Blood Loss:  None.  Recommendation:       - Await pathology results.       - Telephone my office for pathology results [phone]), if you have not        received a results letter in 4 weeks.       - No ibuprofen, naproxen, or other non-steroidal anti-inflammatory drugs for        7 days after polyp removal.       - High fiber diet.       - Repeat colonoscopy for surveillance based on pathology results.       - Return to referring physician as previously scheduled.  Lelon Huh MD, MD  07/20/2023 2:30:05 PM  This report has been signed electronically.  Number of Addenda: 0  Note Initiated On: 07/20/2023 1:09 PM

## 2023-07-20 NOTE — Anesthesia Preprocedure Evaluation (Signed)
Pre-Anesthetic Note  .      Patient: Sara Snyder is a 68 year old female      Procedure Information       Date/Time: 07/20/23 1130    Scheduled providers: Lelon Huh, MD    Procedure: COLONOSCOPY    Diagnosis: Iron deficiency anemia, unspecified iron deficiency anemia type [D50.9]    Location: [facility]            Relevant Problems   PULMONARY   (+) DOE (dyspnea on exertion)      CARDIO   (+) Bilateral renal artery stenosis (HCC)   (+) DOE (dyspnea on exertion)   (+) Essential hypertension   (+) Varicose veins of calf      GI   (+) Gastroesophageal reflux disease without esophagitis      GU/RENAL   (+) Stage 3a chronic kidney disease      ENDO   (+) Other specified hypothyroidism           Previous Anesthetic History:   Past Surgical History:  No date: CATARACT REMOVAL INSERTION OF LENS; Right  10/2016: HEMORRHOID SURGERY  No date: OB ANTEPARTUM CARE CESAREAN DLVR & POSTPARTUM        Medications  Current Outpatient Medications   Medication Sig    amlodipine-valsartan (EXFORGE) 5-320 MG per tablet Take 1 tablet by mouth daily    omeprazole (PRILOSEC) 40 MG capsule Take 1 capsule by mouth daily    calcium citrate-citamin D (CALCIUM CITRATE +) 315-5 MG-MCG tablet Take 1 tablet by mouth in the morning and 1 tablet before bedtime.    atorvastatin (LIPITOR) 80 MG tablet Take 1 tablet by mouth in the morning.    levothyroxine (SYNTHROID) 50 MCG tablet Take 1 tablet by mouth every morning before breakfast    chlorthalidone (HYGROTEN) 25 MG tablet Take 1 tablet by mouth in the morning.    polyethylene glycol (GOLYTELY) 236 g suspension Take 4,000 mLs by mouth See Admin Instructions Take as directed prior to your colonoscopy    Simethicone 80 MG TABS Take 4 tablets by mouth See Admin Instructions Take as directed prior to colonoscopy.    DULoxetine (CYMBALTA) 30 MG capsule Take 1 capsule by mouth daily . Take together with duloxetine 60 mg daily for total dose of 90 mg daily.    torsemide  (DEMADEX) 10 MG tablet Take 1 tablet by mouth daily    ferrous sulfate 325 (65 FE) MG tablet Take 1 tablet by mouth every other day    meclizine (ANTIVERT) 25 MG TABS Take 1 tablet by mouth every 8 (eight) hours as needed    cholecalciferol (VITAMIN D3) 2000 UNIT tablet Take 1 tablet by mouth in the morning and 1 tablet before bedtime.    OTHER MEDICATION Use 1 each As Directed daily Blood Pressure Monitoring Kit   Arm Circumference LARGE  For use as directed: "Please Dispense AMA validated cuff see [email or web address] (such as [employer])"       DX: hypertension  ICD10: I10    Blood Pressure KIT Use 1 kit As Directed daily before breakfast Regular ICD i10    DULoxetine (CYMBALTA) 60 MG capsule Take 1 capsule by mouth in the morning.    spironolactone (ALDACTONE) 50 MG tablet Take 1 tablet by mouth in the morning. STOP ATENOLOL, START THIS MEDICATION.    clonazePAM (KLONOPIN) 1 MG tablet 1 tablet two times daily, PRN    gabapentin (NEURONTIN) 100 MG capsule Take 1 capsule  by mouth in the morning and 1 capsule at noon and 1 capsule before bedtime.    budesonide-formoterol (SYMBICORT) 160-4.5 MCG/ACT inhaler Inhale 2 puffs into the lungs in the morning and 2 puffs before bedtime. Use 1 puff prn during the day. Rinse mouth after use..    latanoprost (XALATAN) 0.005 % ophthalmic solution Place 1 drop into both eyes nightly     No current facility-administered medications for this encounter.         Allergies:   Review of Patient's Allergies indicates:   Pollen extract          Cough    Smoking, Alcohol, Drugs:  Social History    Tobacco Use      Smoking status: Former        Packs/day: 0.00        Types: Cigarettes        Quit date: 10/27/1977        Years since quitting: 45.7      Smokeless tobacco: Never    Alcohol use: Not Currently      Drug use:   Unknown       PMHx:  Past Medical History:  09/11/2018: Chronic kidney disease, stage III (moderate) (HCC)  08/14/2019: COVID-19      Comment:  Risk category:   high  Date of  symptom onset: 08/12/19                 Tested? Positive  Risk factors: CKD, BMI > 30, HTN                 Clinical Course First date of symptoms:  08/12/19                 08/15/19: (DOI 3): Pt states she is feeling well. Slight                ST and sinus congestion. No cough, fever, SOB, DOE or                edema. F/U in 3 days.  08/18/2019: DOI 7- Feeling better                over all, stable vitals, CM f/u in 2 days. 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 Negative for acute myocardial infarction.   Physical Activity in METs 4    Pulmonary: Negative. negative for Pulmonary DiseaseNegative for asthma, COPD and tobacco use.   GU/Renal:  Positive for kidney disease.   Hepatic: Skin negative for hepatic disease.    Neurological: Negative for  neurological diseases.    Gastrointestinal:  Positive for GERD.   Hematological: Negative for hematological diseases.    Endocrine: Positive for Diabetes (prediabetes A1c 6.9%) and hypothyroidism.   Eyes:  Positive for cataract.   Musculoskeletal:  Positive for back pain.   Psychiatric:  Positive for depression, anxiety, panic and sleep disturbance.    Constitutional:         Morbid obesity (BMI 40)   Skin: Negative for skin diseases.        Physical  Exam:     General     Level of consciousness:  Alert and oriented (time, person, place)     BMI   BMI greater than 30 kg/m2      Airway     Mallampati:  II    TM distance:  <3 FB    Mouth opening:  >3 FB    Neck ROM:  Full     Teeth    Comments: Adentulous }     Heart  - normal exam       Lungs - normal exam          HEENT:      Abdomen: (+) obese    soft                           Pertinent Labs:   Lab Results   Component Value Date    NA 140 05/19/2023    K 3.6 05/19/2023    CREAT 1.3 (H) 05/19/2023    GLUCOSER 95 05/19/2023    WBC 6.7 04/07/2023    HCT 34.3 04/07/2023    PLTA 297 04/07/2023       EKG:   Results for orders placed or performed in visit on 04/08/22   EKG    Collection Time: 04/08/22  9:15 AM   Result Value Ref Range    EKG REPORT       Test Reason :       Vent. Rate : 071 BPM     Atrial Rate : 071 BPM        P-R Int : 178 ms          QRS Dur : 090 ms         QT Int : 392 ms       P-R-T Axes : 090 -17 -03 degrees        QTc Int : 425 ms          Normal sinus rhythm     Minimal voltage criteria for LVH, may be normal variant ( R in aVL )     Cannot rule out Anterior infarct , age undetermined     Abnormal ECG     When compared with ECG of 25-Jun-2020 11:11,     No significant change was found          Referred By: Allegra Grana EMILY LUPEZ           Confirmed ZO:XWRUEA JOVENTINO         PFTs:   Results for orders placed or performed in visit on 03/21/18   FULL PULMONARY FUNCTION TEST    Collection Time: 03/21/18 10:45 AM    Transcription    Flow volume loops are normal, and unchanged with bronchodilator.  FEV1 is 2.02 L (98%   predicted), FVC is 2.53 L (97% predicted), and FEV1 to FVC ratio is normal at 80%.  Lung volumes are all within  normal limits, with the exception of a reduced expiratory   reserve volume.  Diffusion capacity is calculated as being within normal limits (55%   of predicted, and when adjusted for alveolar volume, 114% predicted).  O2 saturation ranges from 96 to 98% with  exertion.  Impression: No obstructive or restrictive dysfunction.  Diffusion capacity and   exercise oximetry are within normal limits.  Recommendation: If asthma is a concern, patient may be referred for methacholine   challenge testing.           STRESS TEST:   March 2022:  3 minutes, stopped for SOB & knee pain.  82% HR.  DP 22. 5 METS.  20% decr ex cap.   No CP or dangerous arrhythmia.  But 1 mm flat ST in inflat leads, 1st min, subside 11 min recovery.   Sept 2022:  Lexi-nuke.  1 mm ST depr with Lexiscan but no sxs. Nuclear imaging:  NORMAL PERFUSION =  no ischemia or infarction.    Anesthesia Plan    ASA Score:     ASA:  3    Airway:      Mallampati:  II    Mouth opening:  >3 FB    Neck ROM:  Full    TM distance:  <3 FB     Plan: TIVA    Other information:     Full Stomach Precaution:: No      Post-Plan::  PACU    Informed Consent:     Anesthetic plan and risks discussed with:  Patient   Patient Consented        Attending Anesthesiologist Statement:     Reassessed day of surgery: Yes        Assessment made, necessary equipment and appropriate plan in place.

## 2023-07-20 NOTE — H&P (Signed)
Procedure H & P    HPI: Chart reviewed/see prior GI note. Referred by Dr. Despina Arias. 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     Briefly, 68 year old here today with IDA. Last Hgb was 10.8, ferritin 13, TIBC 10%, MCV 85% in Oct 2024. She took iron supplementation for a few days 2 weeks ago, then stopped because "too many medications". Planned to undergo Colonoscopy.     Denies abdominal pain, weight loss.   Notes melena 3-4 times per week starting about 3 weeks ago. Also noted bright red bloody stools around time of lab work (Oct 2024), now resolved.     Patient denies overall changes in health since our last visit.     Interpreter ID #[id number], #[id number]    Past Medical History  Past Medical History:  09/11/2018: Chronic kidney disease, stage III (moderate) (HCC)  08/14/2019: COVID-19      Comment:  Risk category:   high  Date of symptom onset: 08/12/19                 Tested? Positive  Risk factors: CKD, BMI > 30, HTN                 Clinical Course First date of symptoms:  08/12/19                 08/15/19: (DOI 3): Pt states she is feeling well. Slight                ST and sinus congestion. No cough, fever, SOB, DOE or                edema. F/U in 3 days.  08/18/2019: DOI 7- Feeling better                over all, stable vitals, CM f/u in 2 days. 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)  02/20/2021: Osteopenia  11/29/2017: Panic disorder  No date: Wears eyeglasses    Allergies   Review of Patient's Allergies indicates:   Pollen extract          Cough    Medications  polyethylene glycol (GOLYTELY) 236 g suspension, Take 4,000 mLs by mouth See Admin Instructions Take as directed prior to your colonoscopy, Disp: 4000 mL, Rfl: 0  Simethicone 80 MG TABS,  Take 4 tablets by mouth See Admin Instructions Take as directed prior to colonoscopy., Disp: 4 tablet, Rfl: 0  amlodipine-valsartan (EXFORGE) 5-320 MG per tablet, Take 1 tablet by mouth daily, Disp: 30 tablet, Rfl: 11  DULoxetine (CYMBALTA) 30 MG capsule, Take 1 capsule by mouth daily . Take together with duloxetine 60 mg daily for total dose of 90 mg daily., Disp: 90 capsule, Rfl: 3  omeprazole (PRILOSEC) 40 MG capsule, Take 1 capsule by mouth daily, Disp: 90 capsule, Rfl: 0  torsemide (DEMADEX) 10 MG tablet, Take 1 tablet by mouth daily, Disp: 90 tablet, Rfl: 0  ferrous sulfate 325 (65 FE) MG tablet, Take 1 tablet by mouth every other day, Disp: 15 tablet, Rfl: 11  meclizine (ANTIVERT) 25 MG TABS, Take 1 tablet by mouth every 8 (eight) hours as needed, Disp: 30 tablet,  Rfl: 5  cholecalciferol (VITAMIN D3) 2000 UNIT tablet, Take 1 tablet by mouth in the morning and 1 tablet before bedtime., Disp: 180 tablet, Rfl: 3  calcium citrate-citamin D (CALCIUM CITRATE +) 315-5 MG-MCG tablet, Take 1 tablet by mouth in the morning and 1 tablet before bedtime., Disp: 180 tablet, Rfl: 3  OTHER MEDICATION, Use 1 each As Directed daily Blood Pressure Monitoring Kit   Arm Circumference LARGE  For use as directed: "Please Dispense AMA validated cuff see [email or web address] (such as [employer])"       DX: hypertension  ICD10: I10, Disp: 1 each, Rfl: 0  Blood Pressure KIT, Use 1 kit As Directed daily before breakfast Regular ICD i10, Disp: 1 kit, Rfl: 0  DULoxetine (CYMBALTA) 60 MG capsule, Take 1 capsule by mouth in the morning., Disp: 90 capsule, Rfl: 1  spironolactone (ALDACTONE) 50 MG tablet, Take 1 tablet by mouth in the morning. STOP ATENOLOL, START THIS MEDICATION., Disp: 30 tablet, Rfl: 2  atorvastatin (LIPITOR) 80 MG tablet, Take 1 tablet by mouth in the morning., Disp: 90 tablet, Rfl: 3  clonazePAM (KLONOPIN) 1 MG tablet, 1 tablet two times daily, PRN, Disp: 60 tablet, Rfl: 2  levothyroxine (SYNTHROID) 50 MCG tablet, Take 1 tablet  by mouth every morning before breakfast, Disp: 90 tablet, Rfl: 3  chlorthalidone (HYGROTEN) 25 MG tablet, Take 1 tablet by mouth in the morning., Disp: 90 tablet, Rfl: 3  gabapentin (NEURONTIN) 100 MG capsule, Take 1 capsule by mouth in the morning and 1 capsule at noon and 1 capsule before bedtime., Disp: 90 capsule, Rfl: 2  budesonide-formoterol (SYMBICORT) 160-4.5 MCG/ACT inhaler, Inhale 2 puffs into the lungs in the morning and 2 puffs before bedtime. Use 1 puff prn during the day. Rinse mouth after use.., Disp: 1 each, Rfl: 1  latanoprost (XALATAN) 0.005 % ophthalmic solution, Place 1 drop into both eyes nightly, Disp: 2.5 mL, Rfl: 12    No current facility-administered medications on file prior to encounter.    Family/Social History unchanged      ROS:  GEN: NAD, comfortable, obese  CV: no chest pain, palpitations. Has hypertension, hyperlipidemia, dyspnea on exertion.  PULM:  no SOB  ID: no fever, chills  SKIN: no rashes  NEURO: no weakness, numbness. Has fibromyalgia   ENDO: Has secondary hyperparathyroidism, pre-diabetes  RENAL: CKD stage IIIa, bilateral renal artery stenosis  PSYCH: depression, panic disorder    Physical Examination:  Current VS  BP 163/71   Pulse 93   Temp 98.3 F (36.8 C) (Temporal)   Resp 20   Ht 4' 11.84" (1.52 m)   Wt 88.9 kg (196 lb)   SpO2 97%   BMI 38.48 kg/m     GEN: NAD, comfortable  ASA: ASA Class III (a patient with severe systemic disease that limits but is not incapacitating)  CV: RRR  PULM: CTA b/l  ABD: soft, non-tender  PSYCH: A&O x3, crying on interview because pain from fibromyalgia    Pertinent Labs:  IRON (ug/dL)   Date Value   16/03/9603 42 (L)     HEMOGLOBIN (g/dL)   Date Value   54/02/8118 10.8 (L)   06/11/2022 11.0 (L)   05/01/2022 11.5     FERRITIN (ng/mL)   Date Value   04/07/2023 13     Assessment/Plan: Proceed as planned with Colonoscopy.      Procedure(s) fully reviewed with the patient and written informed consent had been obtained, including  sensitive area consent (when applicable).  All questions answered.  Pt understands and agrees with plan.

## 2023-07-20 NOTE — Anesthesia Postprocedure Evaluation (Signed)
Anesthesia Post-Operative Evaluation Note    Patient: Sara Snyder           Procedure Summary       Date: 07/20/23 Room / Location: [facility]    Anesthesia Start: 1356 Anesthesia Stop:     Procedure: COLONOSCOPY Diagnosis: Iron deficiency anemia, unspecified iron deficiency anemia type    Scheduled Providers: Lelon Huh, MD Responsible Provider: Rod Mae, MD    Anesthesia Type: TIVA ASA Status: 3              POST-OPERATIVE EVALUATION    Anesthesia Post Evaluation    Vitals signs in patient's normal range: Yes  Respiratory function stable; airway patent: Yes  Cardiovascular function stable: Yes  Hydration status stable: Yes  Mental status recovered; patient participates in evaluation and/or is at baseline: Yes  Pain control satisfactory: Yes  Nausea and vomiting control satisfactory: YesProcedure was labor & delivery no  PostOP disposition PACU  Anesthesia Observation no significant observation    MIPS#404 Anesthesiology Smoking Abstinence:     The patient is a current smoker (e.g. cigarette, cigar, pipe, e-cigarette/vaping/marijuana) (GN562): No   FOR CODING USE ONLY: IF BLANK Z3086    MIPS#477 Multimodal Pain Management:  Emergent - Exclusion case: No  Patient was administered multimodal pain management (two or more drugs and/or interventions excluding systemic opioids) in the perioperative period; occurring at some time between 6 hours prior to anesthesia start time until discharged from: No   List medical reason(s) patient did not receive multimodal pain management (V7846): no pain anticipated postop  FOR CODING USE ONLY: REASON NOT LISTED N6295    MWUX#324 Perioperative Temperature Management:  Anesthesia start to Anesthesia end time was 60 minutes or longer (4256F): No   FOR CODING USE ONLY: REASON NOT LISTED M0102    MIPS#430 Adult Prevention of PONV:  Patient received an inhalational anesthetic (VO536): No  FOR CODING USE ONLY: REASON NOT LISTED U4403    MIPS#463 Pediatric  Prevention of PONV:  Pediatric patient?: No  FOR CODING USE ONLY: REASON NOT LISTED K7425        [employer] # [id number]          Last vitals    BP: 174/83 (07/20/2023  2:41 PM)  Temp: 97.6 F (36.4 C) (07/20/2023  2:26 PM)  Pulse: 88 (07/20/2023  2:41 PM)  Resp: 20 (07/20/2023  2:41 PM)  SpO2: 97 % (07/20/2023  2:41 PM)

## 2023-07-23 ENCOUNTER — Other Ambulatory Visit (HOSPITAL_BASED_OUTPATIENT_CLINIC_OR_DEPARTMENT_OTHER): Payer: Self-pay

## 2023-07-23 DIAGNOSIS — F321 Major depressive disorder, single episode, moderate: Secondary | ICD-10-CM

## 2023-07-26 IMAGING — MR RM JOELHO ESQ.
4 of 6 series · 18 of 40 positions shown · non-contrast
Comparison: none

[Series 3: sag dp fat · sagittal · 4.0mm · 0.33mm/px · 5 of 20 slices shown]
[im 1/20]
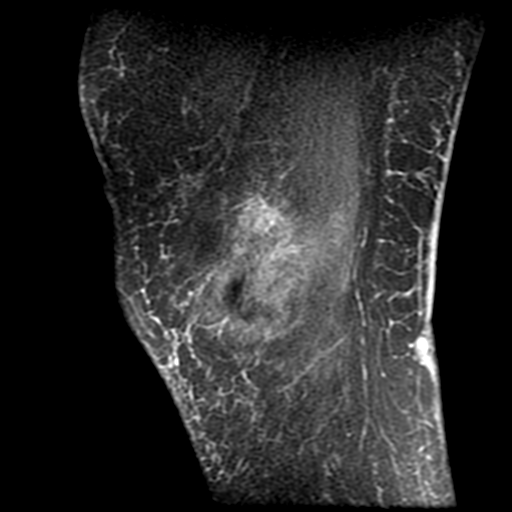
[im 4/20]
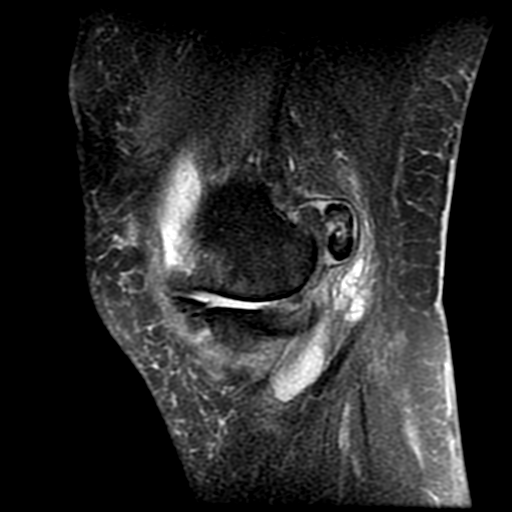
[im 8/20]
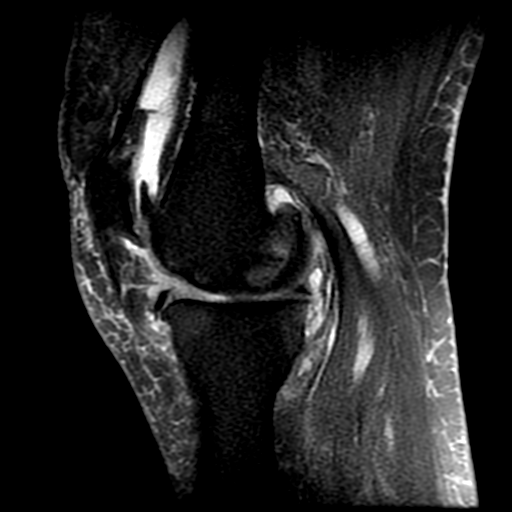
[im 12/20]
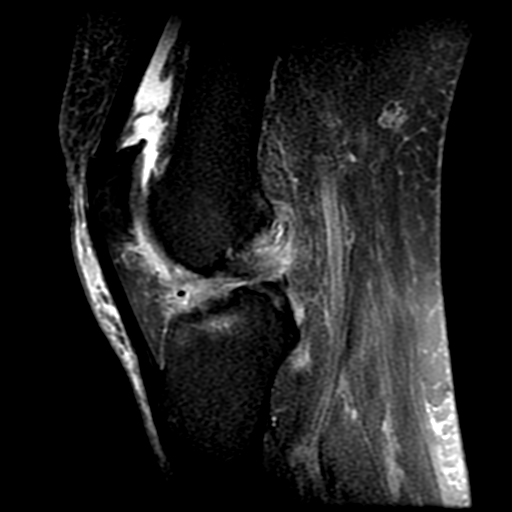
[im 20/20]
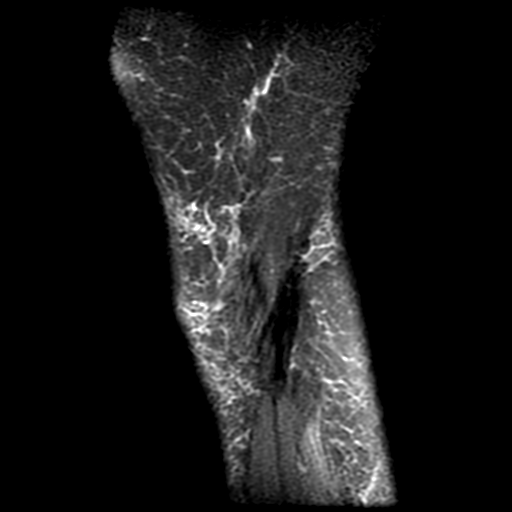

[Series 4: cor dp fat · coronal · 4.0mm · 0.35mm/px · 3 of 20 slices shown]
[im 4/20]
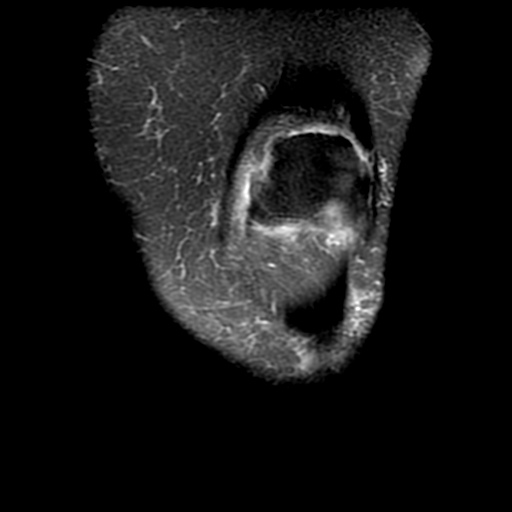
[im 10/20]
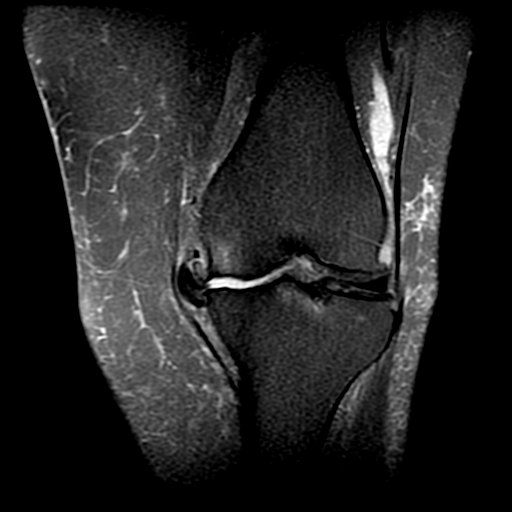
[im 16/20]
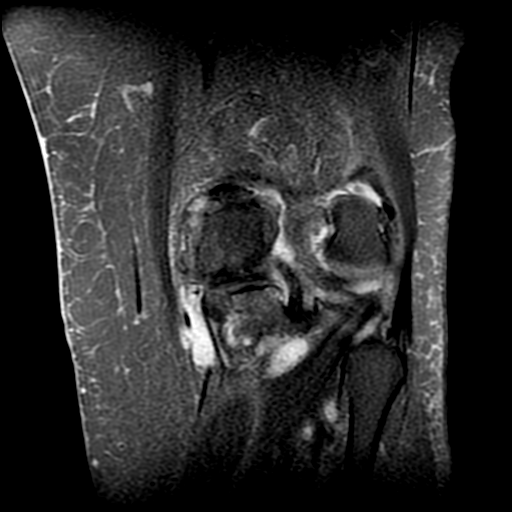

[Series 5: T1 · coronal · 4.0mm · 0.35mm/px · 7 of 20 slices shown]
[im 1/20]
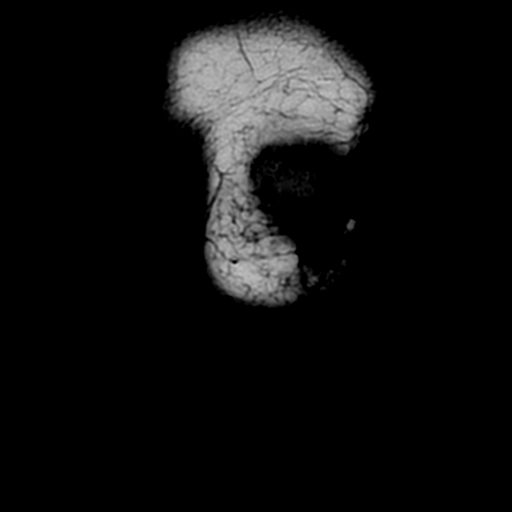
[im 4/20]
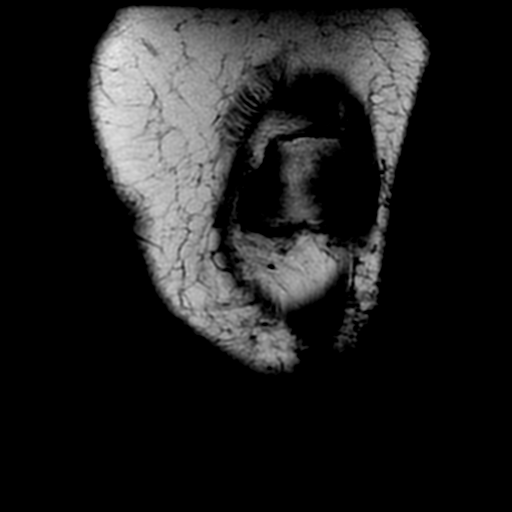
[im 7/20]
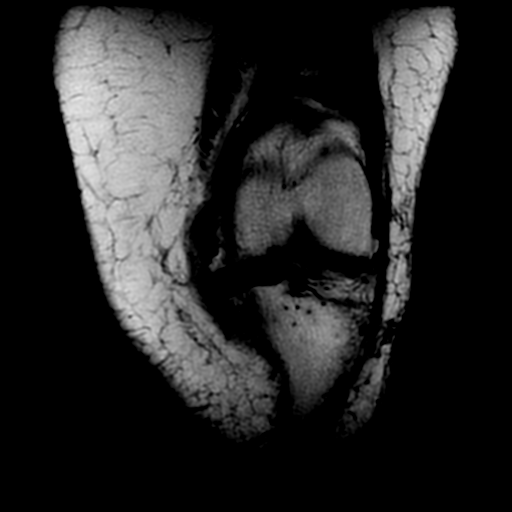
[im 10/20]
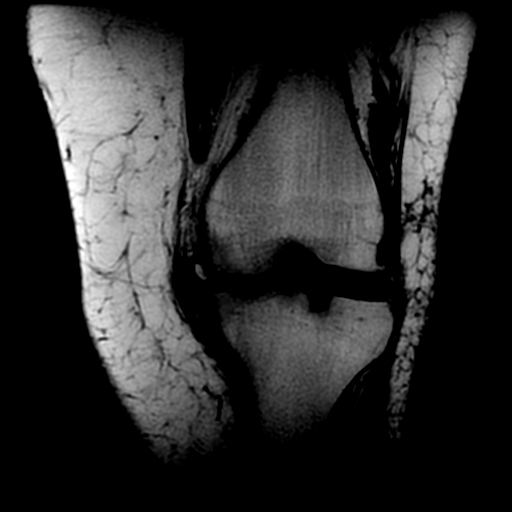
[im 13/20]
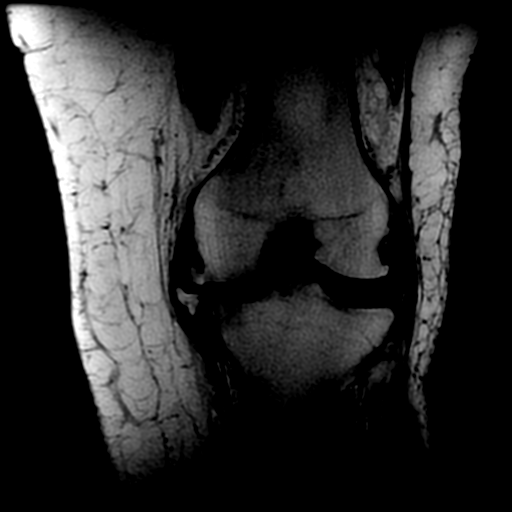
[im 16/20]
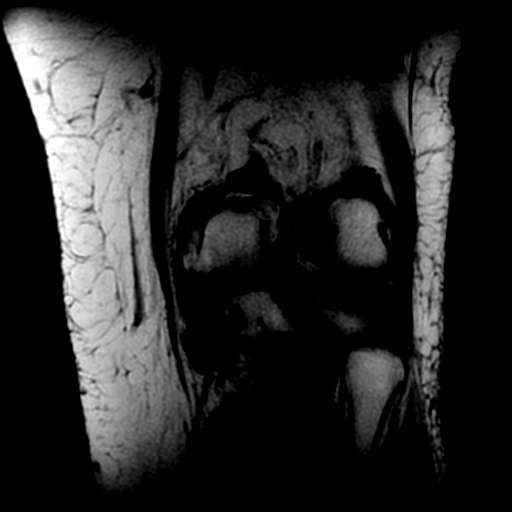
[im 20/20]
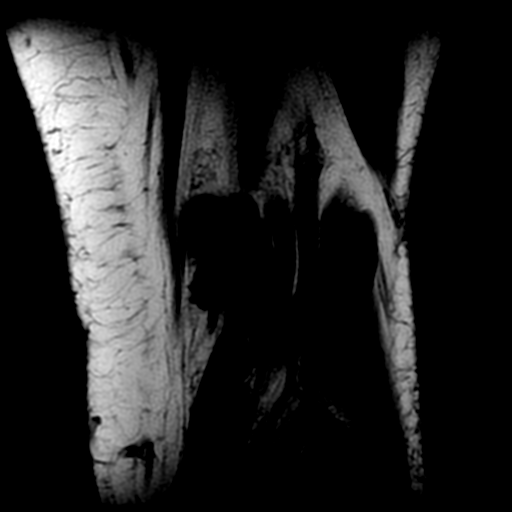

[Series 6: ax dp fat · axial · 3.5mm · 0.35mm/px · z∈[-34,+28]mm · 3 of 24 slices shown]
[im 4/24]
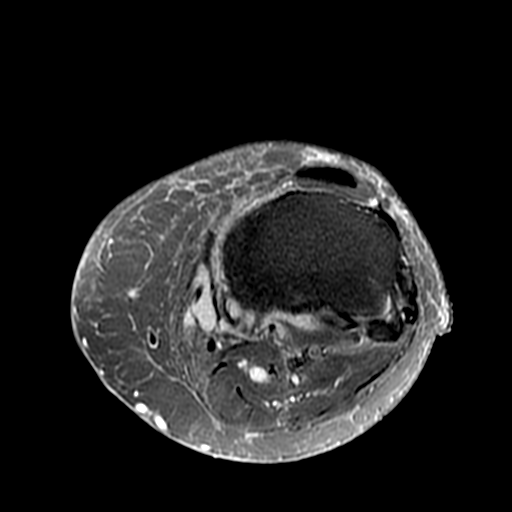
[im 14/24]
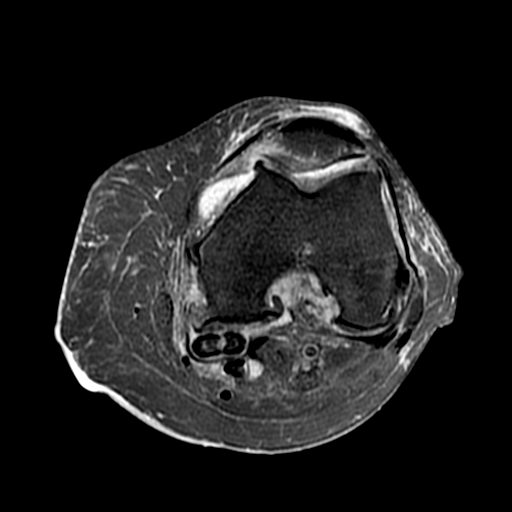
[im 20/24]
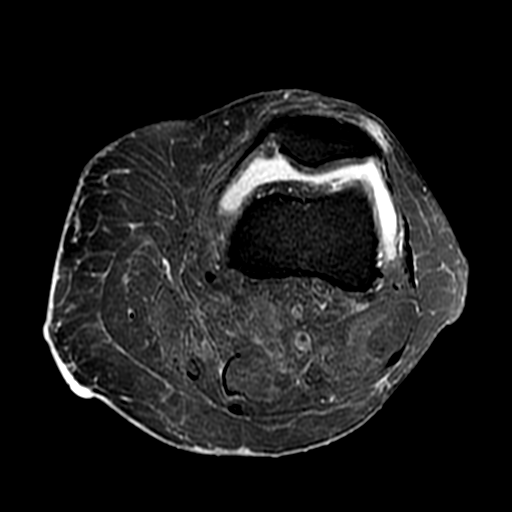

[18 of 40 positions shown; findings below may reference images not displayed]

RESSONÂNCIA MAGNÉTICA DO JOELHO ESQUERDO

Indicação clínica:
Artrose.

Metodologia:
Exame realizado em equipamento de ressonância magnética com sequências, ponderações e planos específicos para o segmento de interesse, sem a administração endovenosa do meio de contraste.

Análise:
Acentuado afilamento irregular no revestimento condral femorotibial medial, predominando na área de carga, onde há exposição com edema do osso subcondral, associado a osteófitos marginais periarticulares. Nota-se ainda leve afundamento sequelar do osso subcondral na área de carga deste côndilo femoral.
Leve / moderado afilamento irregular do revestimento condral nos demais compartimentos articulares do joelho, predominando na interlinha femoropatelar, associado a osteófitos marginais.
Pequeno / moderado derrame articular com espessamento sinovial, notando-se corpos livres intra-articulares, o maior posteriormente ao côndilo femoral medial (1,8 cm).
Alteração morfológica com redução volumétrica do corpo e corno posterior do menisco medial, notando-se irregularidades da sua borda livre, além de extrusão do remanescente do corpo meniscal.
Menisco lateral sem roturas.
Degeneração mucinosa do ligamento cruzado anterior.
Ligamentos cruzado posterior e colaterais íntegros, estando o colateral medial abaulado em virtude das alterações degenerativas no respectivo compartimento articular.
Tendão distal do quadríceps e tendão patelar sem particularidades.
Edema intersticial do plano subcutâneo anterior do joelho.
Distensão líquida da bursa da pata anserina.
Leve distensão líquida da bursa do gastrocnêmio medial / semimembranoso.
Planos musculares sem particularidades.

Conclusão: 
Osteoartrose tricompartimental do joelho, bem mais evidente femorotibial medial.
Pequeno / moderado derrame articular com sinovite e corpos livres intra-articulares.
Lesão degenerativa do corpo e corno posterior do menisco medial, notando-se ainda extrusão do remanescente do corpo meniscal.
Degeneração mucinosa do ligamento cruzado anterior. 
Bursite da pata anserina.
Demais aspectos acima descritos.

## 2023-07-26 IMAGING — MR RM COLUNA CERVICAL
7 of 15 series · 28 of 48 positions shown · non-contrast
Comparison: none

[Series 9: T2 · sagittal · 3.5mm · 0.47mm/px · 2 of 12 slices shown (1 of 7)]
[im 1/12]
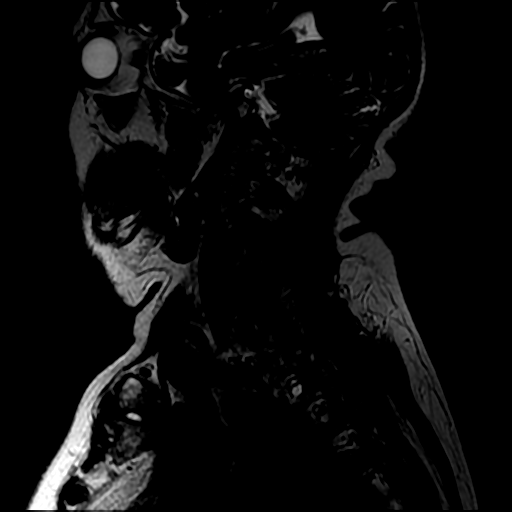
[im 12/12]
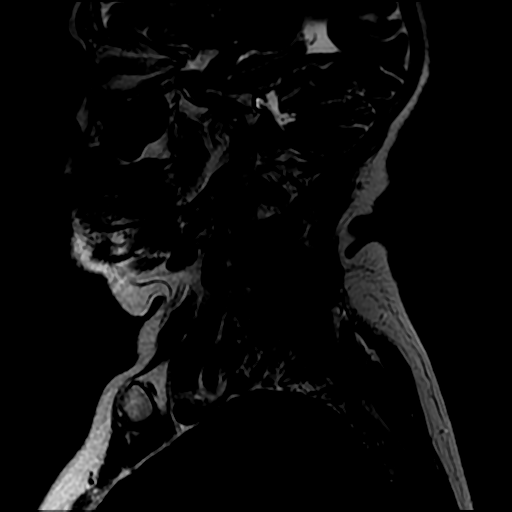

[Series 12: T2 · axial · 3.5mm · 0.39mm/px · z∈[-138,-52]mm · 5 of 24 slices shown (2 of 7)]
[im 1/24]
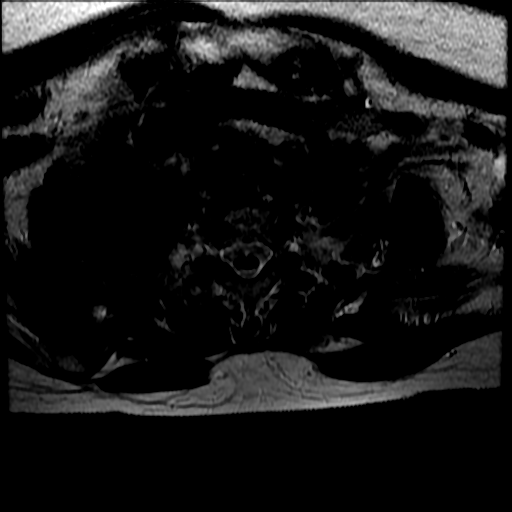
[im 6/24]
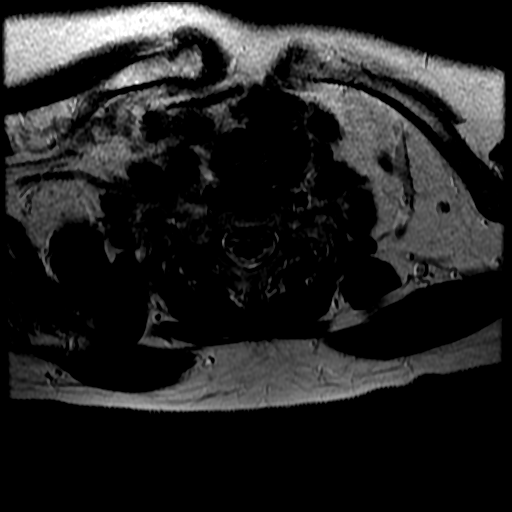
[im 12/24]
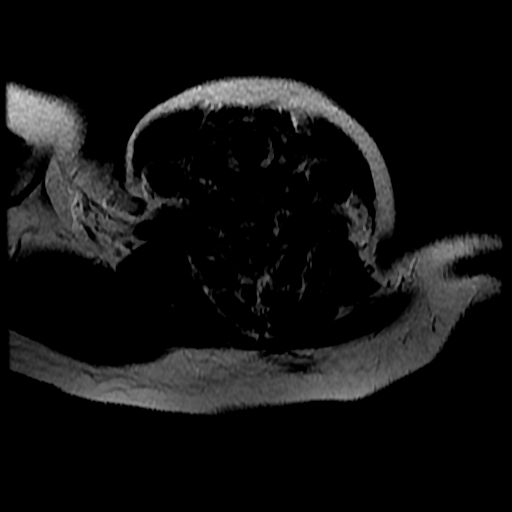
[im 18/24]
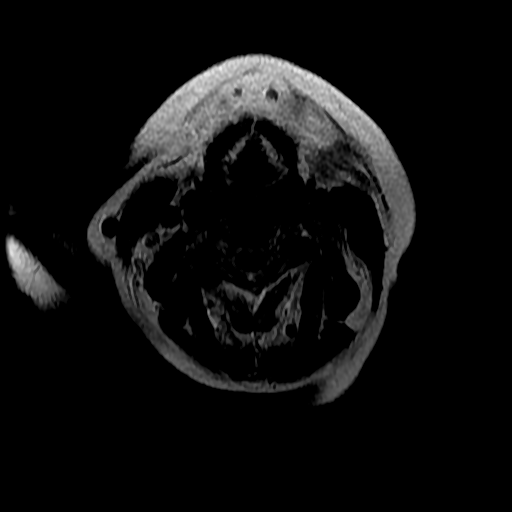
[im 24/24]
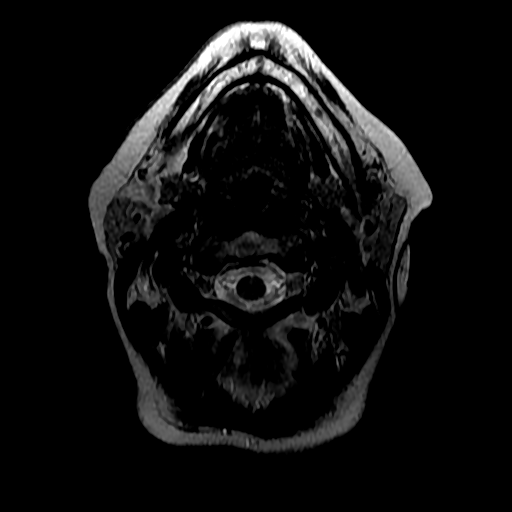

[Series 14: T2 · sagittal · 4.0mm · 0.66mm/px · 3 of 12 slices shown (3 of 7)]
[im 1/12]
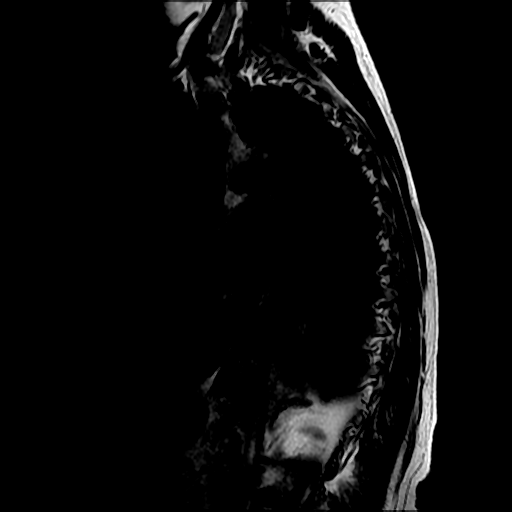
[im 6/12]
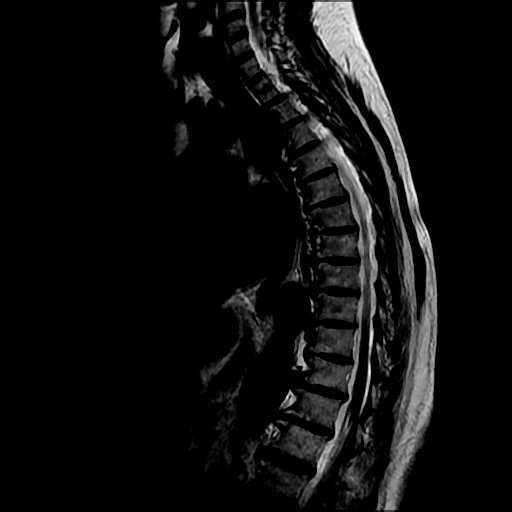
[im 12/12]
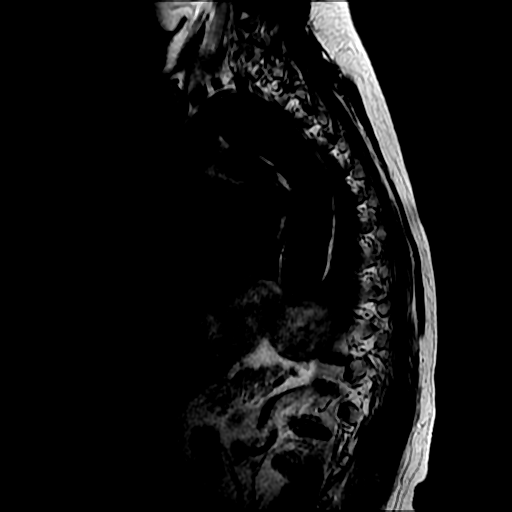

[Series 17: T2 · axial · 4.0mm · 0.78mm/px · z∈[-229,-114]mm · 5 of 25 slices shown (4 of 7)]
[im 1/25]
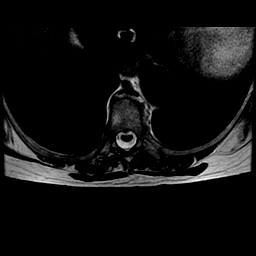
[im 7/25]
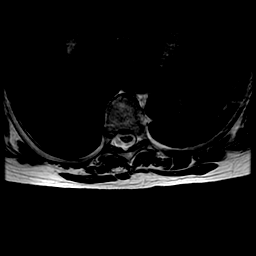
[im 13/25]
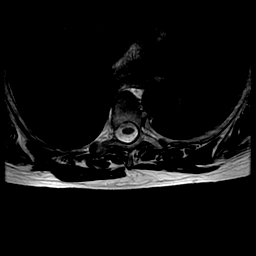
[im 19/25]
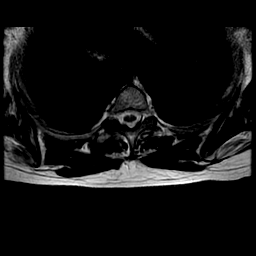
[im 25/25]
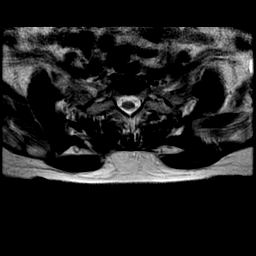

[Series 19: T2 · axial · 4.0mm · 0.78mm/px · z∈[-324,-195]mm · 6 of 27 slices shown (5 of 7)]
[im 1/27]
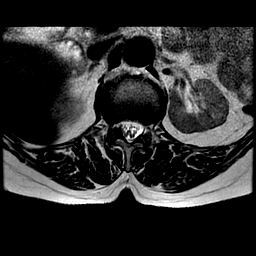
[im 6/27]
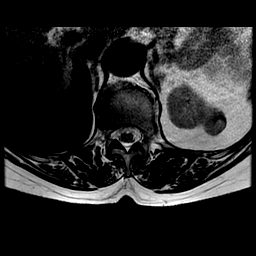
[im 11/27]
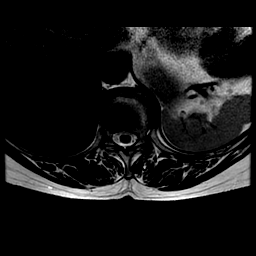
[im 16/27]
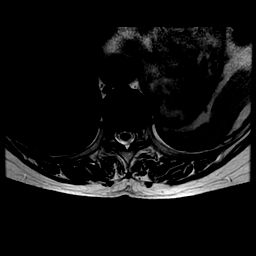
[im 21/27]
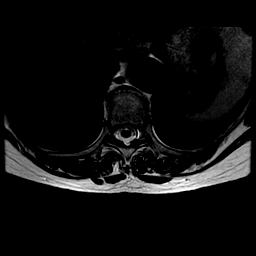
[im 27/27]
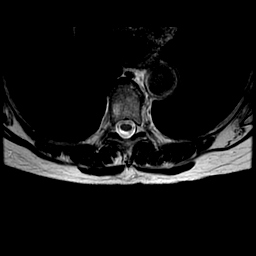

[Series 22: T2 · sagittal · 4.5mm · 1.21mm/px · 3 of 12 slices shown (6 of 7)]
[im 1/12]
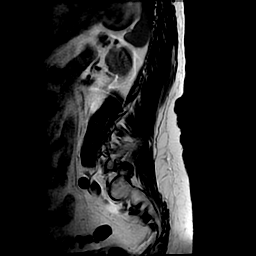
[im 6/12]
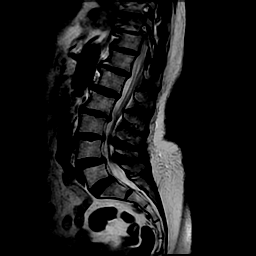
[im 12/12]
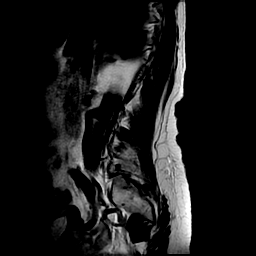

[Series 25: T2 · axial · 4.0mm · 0.43mm/px · z∈[-517,-307]mm · 4 of 20 slices shown (7 of 7)]
[im 1/20]
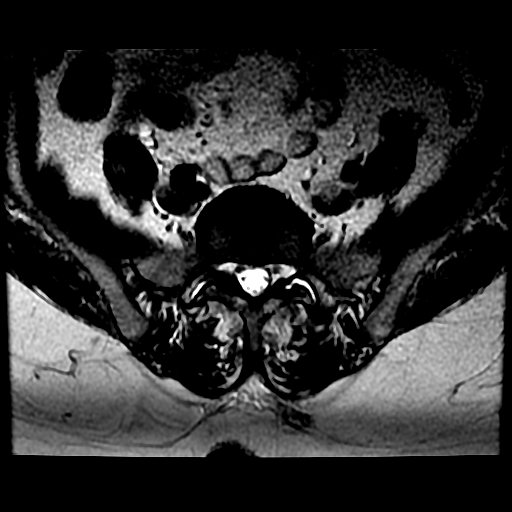
[im 7/20]
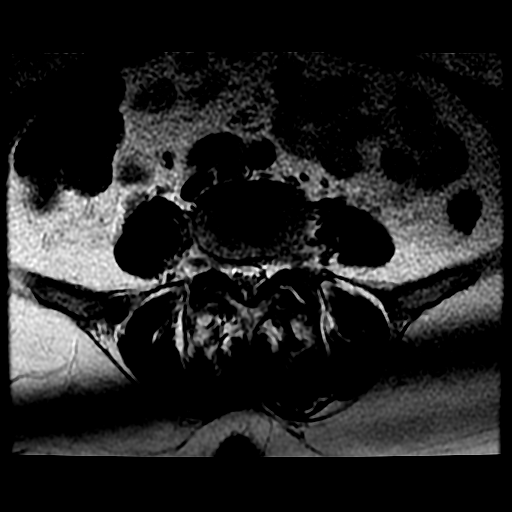
[im 13/20]
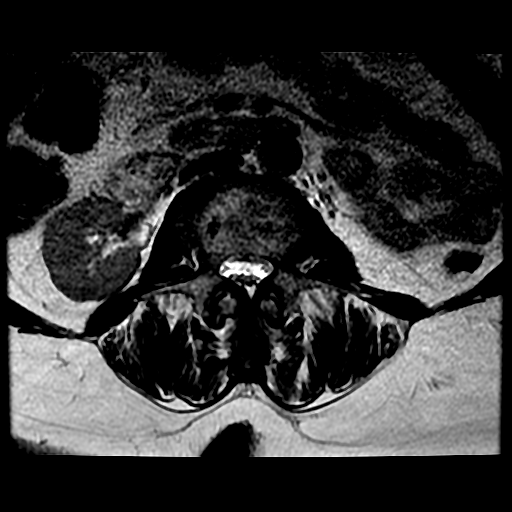
[im 20/20]
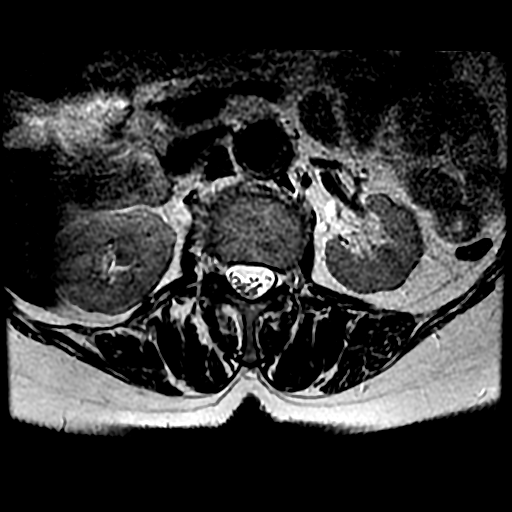

[28 of 48 positions shown; findings below may reference images not displayed]

RESSONÂNCIA MAGNÉTICA DA COLUNA DORSAL
Indicação clínica:Dores crônicas.

Metodologia:
Exame realizado em equipamento de ressonância magnética com sequências, ponderações e planos específicos para o segmento de interesse, sem a administração endovenosa do meio de contraste.

Análise:
Anterolistese degenerativa grau 1 de T10 sobre T11.
Corpos vertebrais com altura conservada, apresentando osteofitose marginal, mais evidente nos níveis dorsais médios e inferiores.
Alterações degenerativas em grande parte das articulações interfacetárias.
Desidratação discal difusa, mais evidente em T10-T11, onde há redução da altura discal e alterações discogênicas do tipo Modic I e II nas placas vertebrais apostas.
Protrusões discais posterocentrais de T4-T5 a T6-T7, assim como paramedianas à esquerda em T7-T8 e T9-T10, que comprimem a face ventral do saco dural, mais evidente em T4-T5 e T6-T7, onde determinam impressão na superfície anterior da medula espinhal.
Canal vertebral e forames intervertebrais com amplitude conservada.
Leve hipotrofia da musculatura paravertebral.
Medula espinhal com sinal normal, apresentando pequenas endentações na sua superfície ventral determinadas pelas impressões discais supradescritas.
Conclusão:
Anterolistese degenerativa grau 1 de T10 sobre T11.
Espondiloartrose dorsal com discopatia degenerativa multissegmentar.
Protrusões discais posterocentrais de T4-T5 a T6-T7, assim como paramedianas à esquerda em T7-T8 e T9-T10, que comprimem a face ventral do saco dural, mais evidente em T4-T5 e T6-T7, onde determinam impressão na superfície anterior da medula espinhal, porém sem sinais de mielopatia compressiva.
Demais aspectos acima descritos.

## 2023-07-26 IMAGING — MR RM JOELHO DIR.
4 of 6 series · 18 of 40 positions shown · non-contrast
Comparison: none

[Series 3: sag dp fat · sagittal · 4.0mm · 0.31mm/px · 5 of 20 slices shown]
[im 1/20]
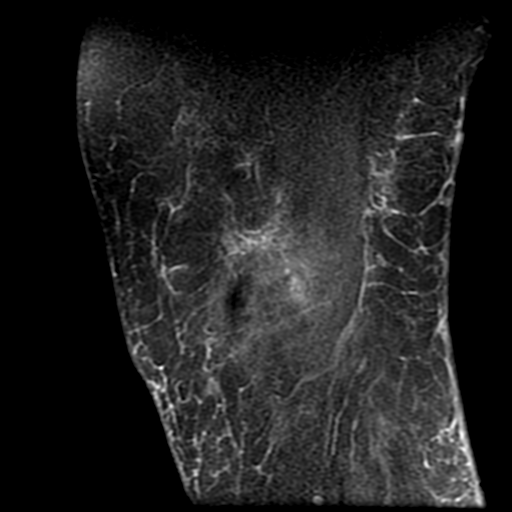
[im 4/20]
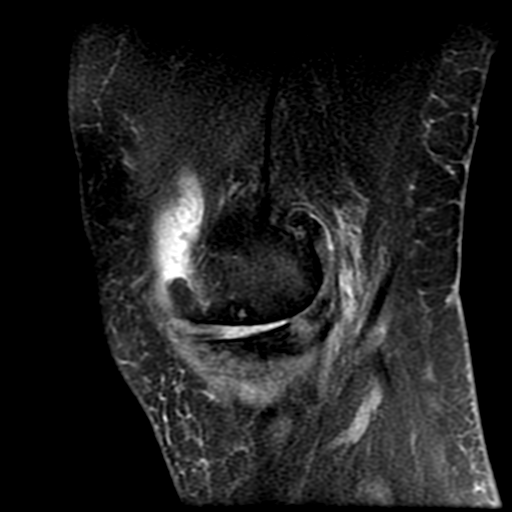
[im 8/20]
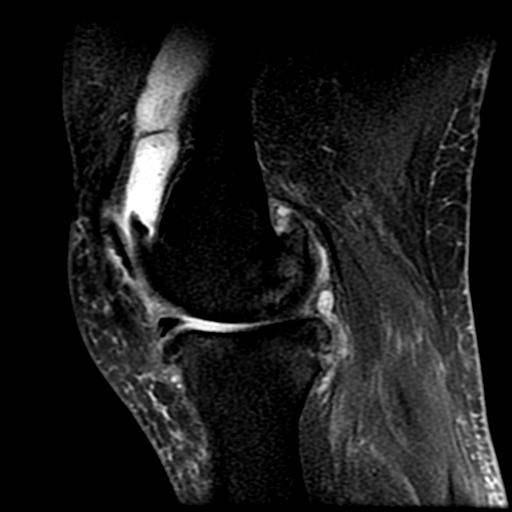
[im 12/20]
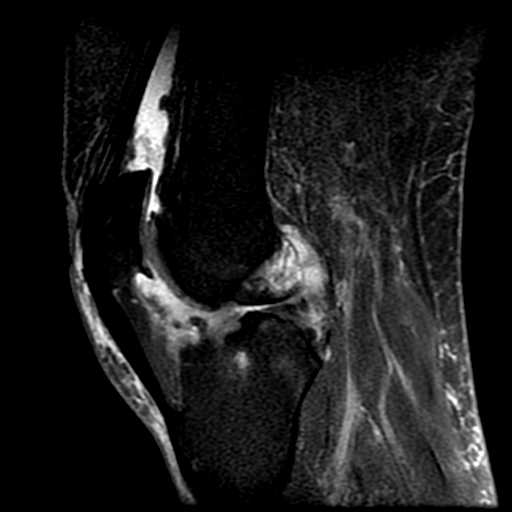
[im 20/20]
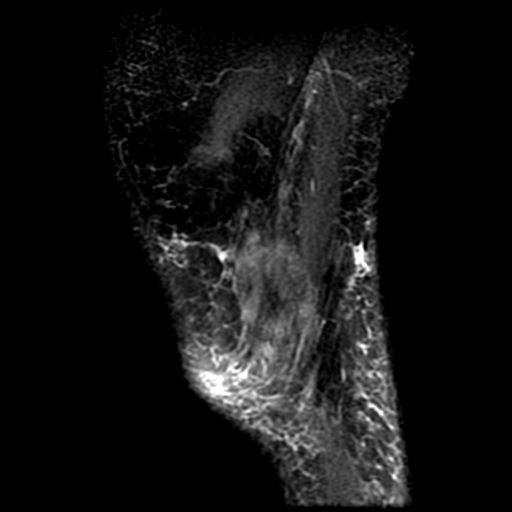

[Series 4: T1 · coronal · 4.0mm · 0.35mm/px · 7 of 20 slices shown]
[im 1/20]
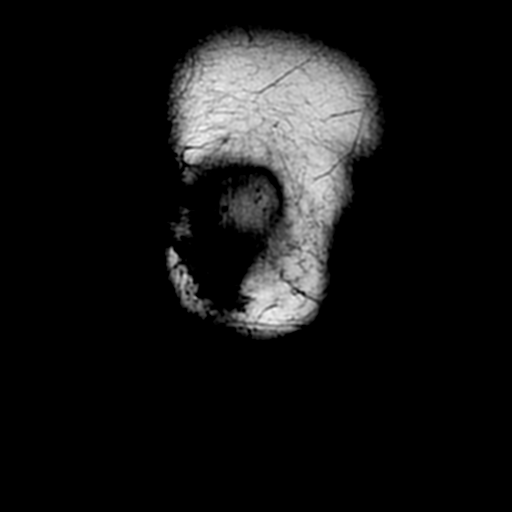
[im 4/20]
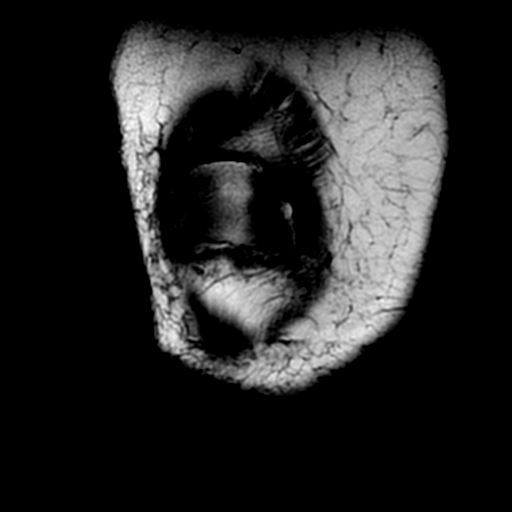
[im 7/20]
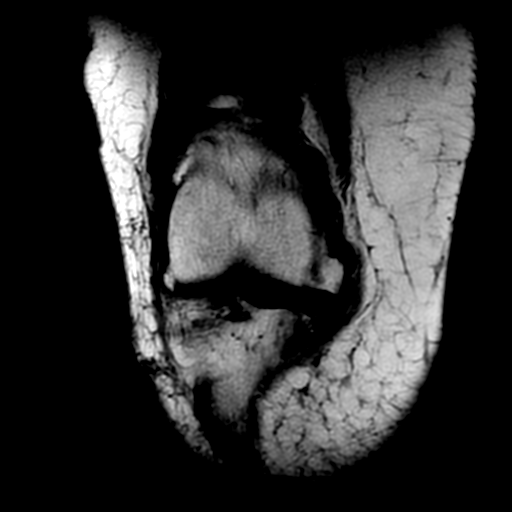
[im 10/20]
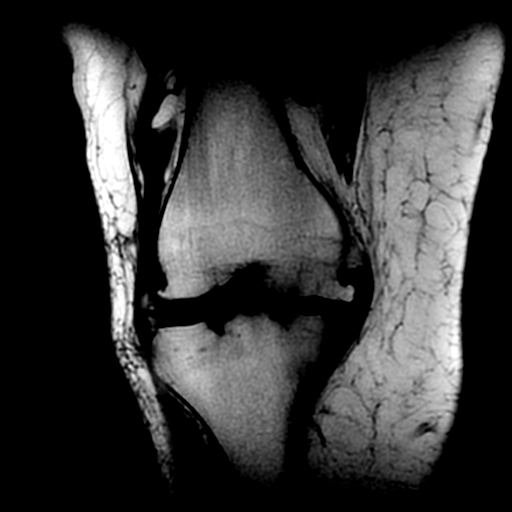
[im 13/20]
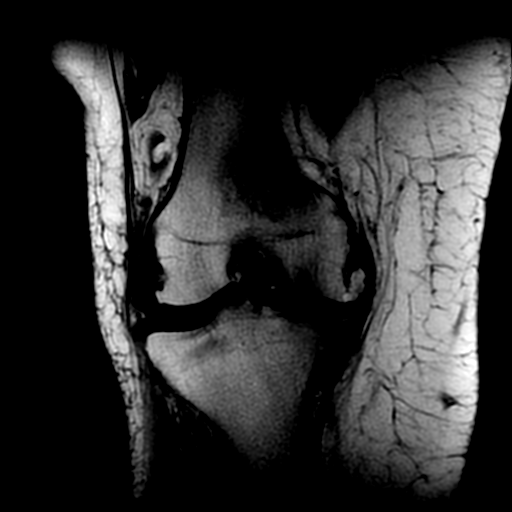
[im 16/20]
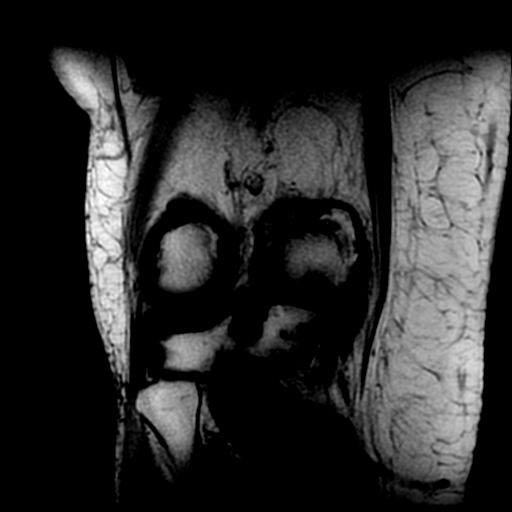
[im 20/20]
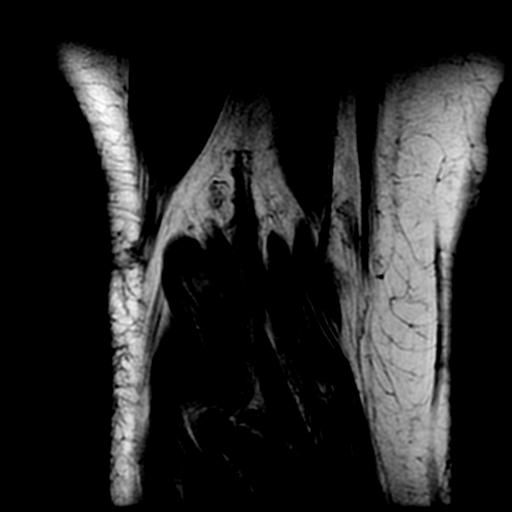

[Series 5: cor dp fat · coronal · 4.0mm · 0.35mm/px · 3 of 20 slices shown]
[im 4/20]
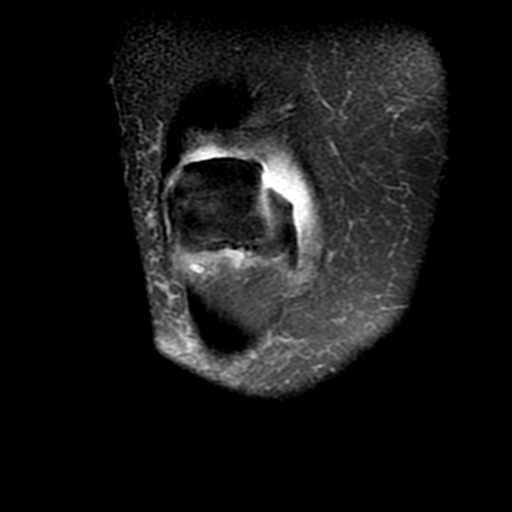
[im 10/20]
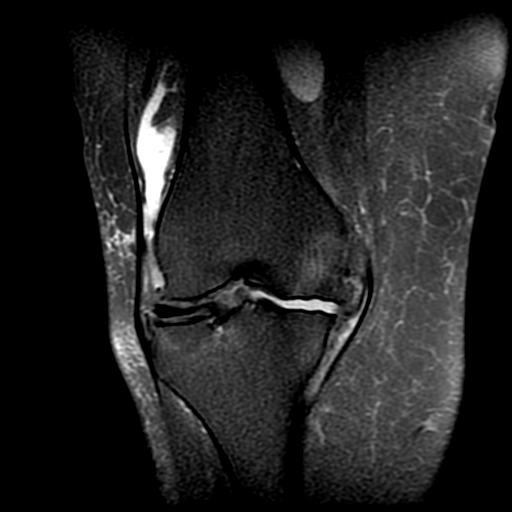
[im 16/20]
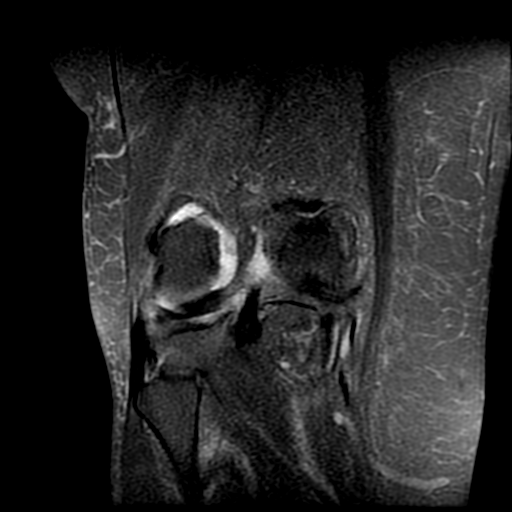

[Series 6: ax dp fat · axial · 3.5mm · 0.35mm/px · z∈[-25,+38]mm · 3 of 24 slices shown]
[im 4/24]
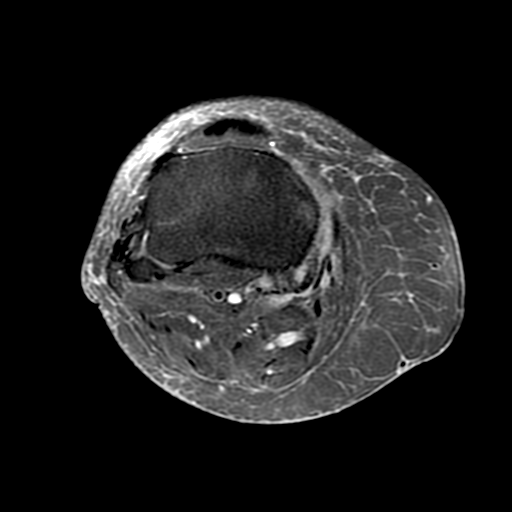
[im 14/24]
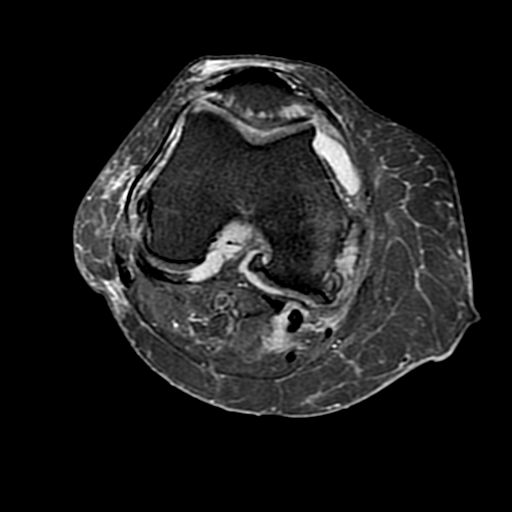
[im 20/24]
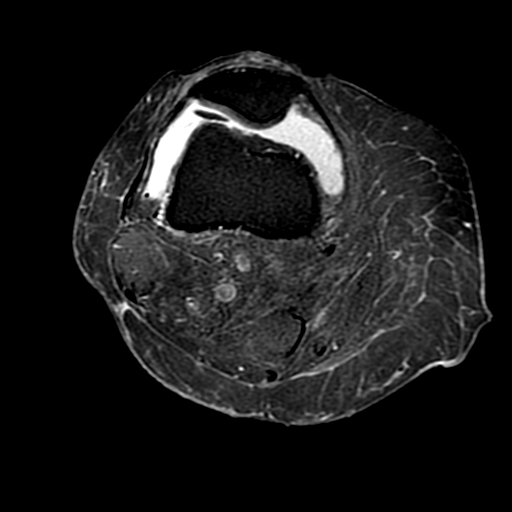

[18 of 40 positions shown; findings below may reference images not displayed]

RESSONÂNCIA MAGNÉTICA DO JOELHO DIREITO

Indicação clínica:
Dor crônica. Artrose.

Metodologia:
Exame realizado em equipamento de ressonância magnética com sequências, ponderações e planos específicos para o segmento de interesse, sem a administração endovenosa do meio de contraste.

Análise:
Acentuado afilamento do revestimento condral femorotibial medial, predominando na área de carga, onde há exposição com edema do osso subcondral, associado a osteofitose marginal periarticular. Nota-se ainda leve afundamento sequelar do osso subcondral na área de carga do respectivo côndilo femoral.
Irregularidades do revestimento condral no demais compartimentos articulares do joelho, mais evidente patelofemoral, onde há focos de edema ósseo subcondral, associado a osteófitos marginais periarticulares.
Moderado / acentuado derrame articular com espessamento sinovial, notando-se imagens compatíveis com corpos livres intra-articulares posteriormente ao côndilo femoral lateral, medindo até 1,0 cm.
Alteração morfológica com maceração e redução volumétrica do corpo e corno posterior do menisco medial, inclusive com extrusão do remanescente do corpo meniscal.
Leve degeneração mucinosa da raiz anterior do menisco lateral.
Rotura crônica completa do ligamento cruzado anterior.
Ligamentos cruzado posterior e colaterais íntegros, estando o colateral medial abaulado em virtude das alterações degenerativas no respectivo compartimento articular.
Tendão distal do quadríceps sem particularidades. Tendinose patelar.
Distensão líquida da bursa da pata anserina com edema na gordura adjacente.
Leve distensão líquida da bursa do gastrocnêmio medial / semimembranoso.
Leve hipotrofia da musculatura periarticular.

Conclusão: 
Osteoartrose tricompartimental do joelho, bem mais evidente femorotibial medial.
Moderado derrame articular com sinovite e corpos livres intra-articulares.
Maceração degenerativa do corpo e corno posterior do menisco medial, notando-se extrusão do remanescente do corpo meniscal.
Rotura completa do ligamento cruzado anterior.
Tendinose patelar, sem roturas.
Bursite / peritendinite anserina.
Demais aspectos acima descritos.

## 2023-07-29 ENCOUNTER — Ambulatory Visit: Payer: No Typology Code available for payment source

## 2023-07-29 ENCOUNTER — Other Ambulatory Visit: Payer: Self-pay

## 2023-07-29 DIAGNOSIS — Z9889 Other specified postprocedural states: Secondary | ICD-10-CM

## 2023-07-29 DIAGNOSIS — Z0120 Encounter for dental examination and cleaning without abnormal findings: Secondary | ICD-10-CM

## 2023-07-29 NOTE — Progress Notes (Signed)
Patient presented for an post-operative visit following extraction of #[id number]    Patient pain level at beginning of appointment: 0    Interpreter: [place]    *Medical/Dental History*  Chief complaint: "My mouth hurts when I chew"  History of present illness: Patient reports that her mandibular gums are sore when she attempts to eat. She has a maxillary CD and no mandibular prosthesis.   Medical history reviewed and updated.    *Post-op Exam*  Pertinent findings: Extraction sites appear to be healing WNLs.     Remaining sutures were removed.    *Assessment*  Prescription given: None  Referral: Nutritionist  Assessment/Plan: Patient was informed that healing is WNLs, and that the maxillary denture is not intended to be used to masticate with the bare mandibular ridge. Especially since healing is still in process, doing so will be painful.     Will fabricate new maxillary and mandibular CDs to restore function for patient. Patient was offered a consult with nutritionist to determine diet that can accommodate her dentition in the interim. Patient understood.    Patient pain level at end of appointment: 0    Patient left ambulatory, satisfied with treatment. Any remaining planned treatments were explained in detail to the patient.    Assistant: Homero Fellers  Next visit: What: Preliminary impressions Max and Md CDs

## 2023-07-31 LAB — SURGICAL PATH SPECIMEN GASTROINTESTINAL

## 2023-08-02 ENCOUNTER — Encounter (HOSPITAL_BASED_OUTPATIENT_CLINIC_OR_DEPARTMENT_OTHER): Payer: Self-pay | Admitting: Gastroenterology

## 2023-08-02 DIAGNOSIS — D126 Benign neoplasm of colon, unspecified: Secondary | ICD-10-CM | POA: Insufficient documentation

## 2023-08-18 ENCOUNTER — Ambulatory Visit (HOSPITAL_BASED_OUTPATIENT_CLINIC_OR_DEPARTMENT_OTHER): Payer: No Typology Code available for payment source | Admitting: Ophthalmology

## 2023-08-25 ENCOUNTER — Encounter (HOSPITAL_BASED_OUTPATIENT_CLINIC_OR_DEPARTMENT_OTHER): Payer: Self-pay | Admitting: Internal Medicine

## 2023-08-25 ENCOUNTER — Encounter (HOSPITAL_BASED_OUTPATIENT_CLINIC_OR_DEPARTMENT_OTHER): Payer: Self-pay

## 2023-08-25 ENCOUNTER — Encounter (LOCAL_COMMUNITY_HEALTH_CENTER): Payer: Self-pay | Admitting: Geri Psychiatry (PRV Practice 16)

## 2023-08-25 DIAGNOSIS — N1831 Chronic kidney disease, stage 3a: Secondary | ICD-10-CM

## 2023-08-25 DIAGNOSIS — I10 Essential (primary) hypertension: Secondary | ICD-10-CM

## 2023-08-25 DIAGNOSIS — E038 Other specified hypothyroidism: Secondary | ICD-10-CM

## 2023-08-25 DIAGNOSIS — F330 Major depressive disorder, recurrent, mild: Secondary | ICD-10-CM

## 2023-08-25 DIAGNOSIS — K219 Gastro-esophageal reflux disease without esophagitis: Secondary | ICD-10-CM

## 2023-08-25 DIAGNOSIS — M797 Fibromyalgia: Secondary | ICD-10-CM

## 2023-08-26 ENCOUNTER — Ambulatory Visit: Payer: No Typology Code available for payment source

## 2023-08-26 MED ORDER — CHLORTHALIDONE 25 MG PO TABS
25.0000 mg | ORAL_TABLET | Freq: Every day | ORAL | 3 refills | Status: DC
Start: 2023-08-26 — End: 2023-12-10

## 2023-08-26 MED ORDER — TORSEMIDE 10 MG PO TABS
10.0000 mg | ORAL_TABLET | Freq: Every day | ORAL | 3 refills | Status: DC
Start: 2023-08-26 — End: 2023-12-31

## 2023-08-26 MED ORDER — OMEPRAZOLE 40 MG PO CPDR
40.0000 mg | DELAYED_RELEASE_CAPSULE | Freq: Every day | ORAL | 3 refills | Status: DC
Start: 2023-08-26 — End: 2024-03-24

## 2023-08-26 MED ORDER — MECLIZINE HCL 25 MG PO TABS
25.0000 mg | ORAL_TABLET | Freq: Three times a day (TID) | ORAL | 5 refills | Status: AC | PRN
Start: 2023-08-26 — End: 2024-02-22

## 2023-08-26 NOTE — Telephone Encounter (Signed)
 PER Patient (self), Sara Snyder is a 68 year old female has requested a refill of     CLONAZEPAM 1     Last prescribed -   start date: 03/16/2023   end date: 05/26/2023      Last [facility] Office Visit: 06/30/2023  Last Physical Exam: 05/2020      Other Med Adult:  Most Recent BP Reading(s)  07/29/23 : 137/70        Cholesterol (mg/dL)   Date Value   16/03/9603 215     LOW DENSITY LIPOPROTEIN DIRECT (mg/dL)   Date Value   54/02/8118 153     HIGH DENSITY LIPOPROTEIN (mg/dL)   Date Value   14/78/2956 44     TRIGLYCERIDES (mg/dL)   Date Value   21/30/8657 148         THYROID SCREEN TSH REFLEX FT4 (uIU/mL)   Date Value   05/01/2022 3.320         TSH (THYROID STIM HORMONE) (uIU/mL)   Date Value   01/14/2021 7.090 (H)       HEMOGLOBIN A1C (%)   Date Value   12/03/2022 6.9 (H)       No results found for: "POCA1C"      No results found for: "INR"    SODIUM (mmol/L)   Date Value   05/19/2023 140       POTASSIUM (mmol/L)   Date Value   05/19/2023 3.6           CREATININE (mg/dL)   Date Value   84/69/6295 1.3 (H)       Documented patient preferred pharmacies:    [facility] [place], [place] - [address]  Phone: [phone] Fax: [phone]

## 2023-08-26 NOTE — Telephone Encounter (Signed)
 Refill at pharmacy, filled for patient

## 2023-08-26 NOTE — Telephone Encounter (Signed)
 PER Patient (self), Sara Snyder is a 68 year old female has requested a refill of omeprazole 40mg .    *patient reports taking continuously*    Last OFFICE/TELE Visit:  03/29/2023 with Petrowitz, D      Last Physical Exam:   06/13/2020     There are no preventive care reminders to display for this patient.    Other Med Adult:  Most Recent BP Reading(s)  07/29/23 : 137/70        Cholesterol (mg/dL)   Date Value   45/40/9811 215     LOW DENSITY LIPOPROTEIN DIRECT (mg/dL)   Date Value   91/47/8295 153     HIGH DENSITY LIPOPROTEIN (mg/dL)   Date Value   62/13/0865 44     TRIGLYCERIDES (mg/dL)   Date Value   78/46/9629 148         THYROID SCREEN TSH REFLEX FT4 (uIU/mL)   Date Value   05/01/2022 3.320         TSH (THYROID STIM HORMONE) (uIU/mL)   Date Value   01/14/2021 7.090 (H)       HEMOGLOBIN A1C (%)   Date Value   12/03/2022 6.9 (H)       No results found for: "POCA1C"      No results found for: "INR"    SODIUM (mmol/L)   Date Value   05/19/2023 140       POTASSIUM (mmol/L)   Date Value   05/19/2023 3.6           CREATININE (mg/dL)   Date Value   52/84/1324 1.3 (H)       Documented patient preferred pharmacies:    [facility] [place], [place] - [address]  Phone: [phone] Fax: [phone]

## 2023-08-26 NOTE — Telephone Encounter (Signed)
 PER Patient (self), Sara Snyder is a 68 year old female has requested a refill of cymbalta 60mg .      Last OFFICE/TELE Visit:  04/29/23 with petrowitz,d      Last Physical Exam: 12/21    There are no preventive care reminders to display for this patient.    Other Med Adult:  Most Recent BP Reading(s)  07/29/23 : 137/70        Cholesterol (mg/dL)   Date Value   96/09/5407 215     LOW DENSITY LIPOPROTEIN DIRECT (mg/dL)   Date Value   81/19/1478 153     HIGH DENSITY LIPOPROTEIN (mg/dL)   Date Value   29/56/2130 44     TRIGLYCERIDES (mg/dL)   Date Value   86/57/8469 148         THYROID SCREEN TSH REFLEX FT4 (uIU/mL)   Date Value   05/01/2022 3.320         TSH (THYROID STIM HORMONE) (uIU/mL)   Date Value   01/14/2021 7.090 (H)       HEMOGLOBIN A1C (%)   Date Value   12/03/2022 6.9 (H)       No results found for: "POCA1C"      No results found for: "INR"    SODIUM (mmol/L)   Date Value   05/19/2023 140       POTASSIUM (mmol/L)   Date Value   05/19/2023 3.6           CREATININE (mg/dL)   Date Value   62/95/2841 1.3 (H)       Documented patient preferred pharmacies:    [facility] [place], [place] - [address]  Phone: [phone] Fax: [phone]

## 2023-08-26 NOTE — Telephone Encounter (Signed)
 PER Patient (self), Sara Snyder is a 68 year old female has requested a refill of meclizine/torsemide.      Last OFFICE/TELE Visit:  04/29/23 with petrowitz,d      Last Physical Exam: 12/21    There are no preventive care reminders to display for this patient.    Other Med Adult:  Most Recent BP Reading(s)  07/29/23 : 137/70        Cholesterol (mg/dL)   Date Value   65/78/4696 215     LOW DENSITY LIPOPROTEIN DIRECT (mg/dL)   Date Value   29/52/8413 153     HIGH DENSITY LIPOPROTEIN (mg/dL)   Date Value   24/40/1027 44     TRIGLYCERIDES (mg/dL)   Date Value   25/36/6440 148         THYROID SCREEN TSH REFLEX FT4 (uIU/mL)   Date Value   05/01/2022 3.320         TSH (THYROID STIM HORMONE) (uIU/mL)   Date Value   01/14/2021 7.090 (H)       HEMOGLOBIN A1C (%)   Date Value   12/03/2022 6.9 (H)       No results found for: "POCA1C"      No results found for: "INR"    SODIUM (mmol/L)   Date Value   05/19/2023 140       POTASSIUM (mmol/L)   Date Value   05/19/2023 3.6           CREATININE (mg/dL)   Date Value   34/74/2595 1.3 (H)       Documented patient preferred pharmacies:    [facility] [place], [place] - [address]  Phone: [phone] Fax: [phone]

## 2023-08-26 NOTE — Telephone Encounter (Signed)
 PER Pharmacy, Sara Snyder is a 68 year old female has requested a refill of chlorthalidone.    This refill request failed protocol because     Diuretics Protocol Failed03/10/2023 08:13 PM   Protocol Details  eGFR > 55 in past year        Out of range lab, unable to approve protocol automatically. Provider please review if appropriate. Thank you    Last Office Visit: 05/19/23 with odonnell, a  Last Physical Exam: 06/13/20    There are no preventive care reminders to display for this patient.    HTN Med:    Most Recent BP Reading(s)  07/29/23 : 137/70  07/20/23 : 174/83  07/15/23 : 163/74      Documented patient preferred pharmacies:    [facility] [place], [place] - [address]  Phone: [phone] Fax: [phone]

## 2023-08-26 NOTE — Telephone Encounter (Signed)
 Hello,    Please disregard any MyChart messages you may have received. I have confirmed [employer] has refills on file for your medication(s).     In the future, please contact your pharmacy directly at [phone] to refill your medication.    If you use a [facility] please download the pharmacy app by texting "RXLOCAL" to [phone] or search RXLOCAL on the Apple App Store or Google Play to request refills directly from your pharmacy.       If you have any questions please [name] a call at [phone] option [phone]    Thank you

## 2023-08-28 ENCOUNTER — Ambulatory Visit: Attending: Ophthalmology | Admitting: Ophthalmology

## 2023-08-28 ENCOUNTER — Encounter (HOSPITAL_BASED_OUTPATIENT_CLINIC_OR_DEPARTMENT_OTHER): Payer: Self-pay | Admitting: Ophthalmology

## 2023-08-28 ENCOUNTER — Other Ambulatory Visit: Payer: Self-pay

## 2023-08-28 DIAGNOSIS — H409 Unspecified glaucoma: Secondary | ICD-10-CM | POA: Insufficient documentation

## 2023-08-28 MED ORDER — LATANOPROST 0.005 % OP SOLN
1.0000 [drp] | Freq: Every evening | OPHTHALMIC | 11 refills | Status: DC
Start: 2023-08-28 — End: 2023-12-01

## 2023-08-28 NOTE — Progress Notes (Signed)
 Pt here for yearly glaucoma exam and test   review    Pt c/o intermittent near va and a sand like sensation, excess tearing and see's a shadow over OS daily x 6 months  Denies floaters ou but see's flashes at times  Doesn't use eye gtts    Ocular HX-Glaucoma suspect ou    VF/OCT-04-16-23  LEE-07-14-22

## 2023-08-28 NOTE — Progress Notes (Signed)
 F/u glaucoma suspect to evaluate risk of glaucoma      Impression,     1. Pseudophakia OU-doing well  OD 02/03/22  OS 03/25/22     2. Glaucoma high suspicion     IOP was 25 OD and 20 OS by applanation (No glaucoma medications)     Disc OCT 04/16/23  had good signal strength and revealed an average retinal NFL thickness of 80 microns OD with a C/D of 0.72 and 64 microns OS with a C/D of 0.76             HVF revealed infero nasal defect OD and temporal defect OS     Start Latanoprost 1 gtt OU QHS      3. Refractive error-

## 2023-08-30 ENCOUNTER — Encounter (LOCAL_COMMUNITY_HEALTH_CENTER): Payer: Self-pay | Admitting: Geri Psychiatry (PRV Practice 16)

## 2023-08-30 ENCOUNTER — Encounter (HOSPITAL_BASED_OUTPATIENT_CLINIC_OR_DEPARTMENT_OTHER): Payer: Self-pay

## 2023-08-30 DIAGNOSIS — F330 Major depressive disorder, recurrent, mild: Secondary | ICD-10-CM

## 2023-08-30 DIAGNOSIS — M797 Fibromyalgia: Secondary | ICD-10-CM

## 2023-08-30 DIAGNOSIS — I10 Essential (primary) hypertension: Secondary | ICD-10-CM

## 2023-08-30 MED ORDER — DULOXETINE HCL 30 MG PO CPEP
30.0000 mg | ORAL_CAPSULE | Freq: Every day | ORAL | 3 refills | Status: DC
Start: 2023-08-30 — End: 2023-09-14

## 2023-08-30 MED ORDER — DULOXETINE HCL 60 MG PO CPEP
60.0000 mg | ORAL_CAPSULE | Freq: Every day | ORAL | 1 refills | Status: DC
Start: 2023-08-30 — End: 2023-09-14

## 2023-08-30 MED ORDER — AMLODIPINE BESYLATE-VALSARTAN 5-320 MG PO TABS
1.0000 | ORAL_TABLET | Freq: Every day | ORAL | 2 refills | Status: DC
Start: 2023-08-30 — End: 2024-09-29

## 2023-08-30 NOTE — Telephone Encounter (Signed)
 [facility] - will not be able to fill this prescription    Per psychiatry- that patient unfortunately has missed 2 intakes with Dr. Waneta Martins for psychiatry    They cannot book another intake but if she wants to re-establish care she can go to [facility] per Dr. Letitia Neri suggestion, which I appreciate    I do not think I can safely/appropriately refill the Lorazepam     Sara Snyder R. Delice Bison, MD  08/30/23 3:56 PM

## 2023-08-30 NOTE — Telephone Encounter (Signed)
 PER Patient (self), Mariame Rybolt is a 67 year old female has requested a refill of amlodipine-valsartan (EXFORGE) 5-320 MG per tablet   DULoxetine (CYMBALTA) 60 MG capsule   DULoxetine (CYMBALTA) 30 MG capsule     Last OFFICE/TELE Visit:  04/29/2023 with Petrowitz, D      Last Physical Exam:   06/13/2020     There are no preventive care reminders to display for this patient.    Other Med Adult:  Most Recent BP Reading(s)  07/29/23 : 137/70        Cholesterol (mg/dL)   Date Value   96/29/5284 215     LOW DENSITY LIPOPROTEIN DIRECT (mg/dL)   Date Value   13/24/4010 153     HIGH DENSITY LIPOPROTEIN (mg/dL)   Date Value   27/25/3664 44     TRIGLYCERIDES (mg/dL)   Date Value   40/34/7425 148         THYROID SCREEN TSH REFLEX FT4 (uIU/mL)   Date Value   05/01/2022 3.320         TSH (THYROID STIM HORMONE) (uIU/mL)   Date Value   01/14/2021 7.090 (H)       HEMOGLOBIN A1C (%)   Date Value   12/03/2022 6.9 (H)       No results found for: "POCA1C"      No results found for: "INR"    SODIUM (mmol/L)   Date Value   05/19/2023 140       POTASSIUM (mmol/L)   Date Value   05/19/2023 3.6           CREATININE (mg/dL)   Date Value   95/63/8756 1.3 (H)       Documented patient preferred pharmacies:    [facility] [place], [place] - [address]  Phone: [phone] Fax: [phone]

## 2023-08-30 NOTE — Telephone Encounter (Signed)
 Person calling on behalf of patient: Pharmacy  Sara Snyder is a 68 year old female       - medication(s) being requested: klonopin  End date:06/15/23     Last [facility] OFFICE Visit: 06/30/23 with nava,a       Last Physical Exam: 06/13/2020        There are no preventive care reminders to display for this patient.  Other Med Adult:  Most Recent BP Reading(s)  07/29/23 : 137/70        Cholesterol (mg/dL)   Date Value   45/40/9811 215     LOW DENSITY LIPOPROTEIN DIRECT (mg/dL)   Date Value   91/47/8295 153     HIGH DENSITY LIPOPROTEIN (mg/dL)   Date Value   62/13/0865 44     TRIGLYCERIDES (mg/dL)   Date Value   78/46/9629 148         THYROID SCREEN TSH REFLEX FT4 (uIU/mL)   Date Value   05/01/2022 3.320         TSH (THYROID STIM HORMONE) (uIU/mL)   Date Value   01/14/2021 7.090 (H)       HEMOGLOBIN A1C (%)   Date Value   12/03/2022 6.9 (H)       No results found for: "POCA1C"      No results found for: "INR"    SODIUM (mmol/L)   Date Value   05/19/2023 140       POTASSIUM (mmol/L)   Date Value   05/19/2023 3.6           CREATININE (mg/dL)   Date Value   52/84/1324 1.3 (H)        Documented patient preferred pharmacies:    [facility] [place], [place] - [address]  Phone: [phone] Fax: [phone]

## 2023-08-31 ENCOUNTER — Encounter (HOSPITAL_BASED_OUTPATIENT_CLINIC_OR_DEPARTMENT_OTHER): Payer: Self-pay

## 2023-08-31 ENCOUNTER — Telehealth (HOSPITAL_BASED_OUTPATIENT_CLINIC_OR_DEPARTMENT_OTHER): Payer: Self-pay

## 2023-08-31 ENCOUNTER — Ambulatory Visit

## 2023-08-31 DIAGNOSIS — I150 Renovascular hypertension: Secondary | ICD-10-CM | POA: Diagnosis present

## 2023-08-31 DIAGNOSIS — I10 Essential (primary) hypertension: Secondary | ICD-10-CM | POA: Diagnosis present

## 2023-08-31 DIAGNOSIS — N2581 Secondary hyperparathyroidism of renal origin: Secondary | ICD-10-CM | POA: Insufficient documentation

## 2023-08-31 DIAGNOSIS — N1831 Chronic kidney disease, stage 3a: Secondary | ICD-10-CM | POA: Insufficient documentation

## 2023-08-31 MED ORDER — AMLODIPINE BESYLATE-VALSARTAN 5-320 MG PO TABS
1.0000 | ORAL_TABLET | Freq: Every day | ORAL | 2 refills | Status: DC
Start: 2023-08-31 — End: 2023-12-31

## 2023-08-31 NOTE — Progress Notes (Signed)
 Nephrology and Hypertension Follow-up Note  Reason for visit: This is a 68 year old year-old female here for follow up of CKD, bilateral renal artery stenosis. She has a PMHx of bilateral renal artery stenosis, hypertension, hyperlipidemia, GERD, fibromyalgia.     Past pertinent renal history:    Baseline creatinine: 1.1-1.3     Patient was first diagnosed with HTN: age 63. She had previously been on spironolactone, however this was discontinued due to low blood pressures and elevated heart rate.     Seen by Dr. Wellington Hampshire, vascular surgery, on 06/24/20:  "Comment: Reviewing her duplex imaging studies, she appears to have severe parenchymal disease that would not be improved with renal artery revascularization.  Plan: Based on the CORAL and ASTRAL trials, this medical management is paramount for her as revascularization would only run the risks of the procedure with no added benefit.  Considering her renal function is relatively normal, further intervention would not be deemed necessary.  However, if her renal function were to deteriorate with significant hypertension, then a last resort procedure may be in order."    Interim:  - Last seen in renal clinic on 05/19/23    - Current BP meds: amlodipine-valsartan (exforge) 5-320 mg daily, torsemide 10 mg daily, spironolactone 50 mg daily, chlorthalidone 25 mg daily. Home readings 110-120/50-80 on average.    -Reports doing well overall. Denies any urinary symptoms. Appetite good. She states that swelling has improved with increased torsemide dose. Biggest concern is surrounding clonazepam. She states that she has been on this medication for many years and is running out, which adds to her anxiety.      Most Recent BP Reading(s)  07/29/23 : 137/70  07/20/23 : 174/83  07/15/23 : 163/74    Most Recent Weight Reading(s)  07/20/23 : 88.9 kg (196 lb)  06/01/23 : 91.6 kg (202 lb)  05/19/23 : 91.7 kg (202 lb 3.2 oz)      Medications:    Current Outpatient Medications   Medication  Instructions    amlodipine-valsartan (EXFORGE) 5-320 MG per tablet 1 tablet, Oral, DAILY    atorvastatin (LIPITOR) 80 mg, Oral, DAILY    Blood Pressure KIT 1 kit, Does not apply, DAILY BEFORE BREAKFAST, Regular ICD i10    budesonide-formoterol (SYMBICORT) 160-4.5 MCG/ACT inhaler 2 puffs, Inhalation, 2 TIMES DAILY, Use 1 puff prn during the day. Rinse mouth after use.    calcium citrate-citamin D (CALCIUM CITRATE +) 315-5 MG-MCG tablet 1 tablet, Oral, 2 TIMES DAILY    chlorthalidone (HYGROTEN) 25 mg, Oral, DAILY    cholecalciferol (VITAMIN D3) 2,000 Units, Oral, 2 TIMES DAILY    clonazePAM (KLONOPIN) 1 MG tablet 1 tablet two times daily, PRN    DULoxetine (CYMBALTA) 60 mg, Oral, DAILY    DULoxetine (CYMBALTA) 30 mg, Oral, DAILY, . Take together with duloxetine 60 mg daily for total dose of 90 mg daily.    ferrous sulfate 325 mg, Oral, EVERY OTHER DAY    gabapentin (NEURONTIN) 100 mg, Oral, 3 TIMES DAILY    latanoprost (XALATAN) 0.005 % ophthalmic solution 1 drop, Both Eyes, NIGHTLY    levothyroxine (SYNTHROID) 50 mcg, Oral, EVERY MORNING BEFORE BREAKFAST    meclizine (ANTIVERT) 25 mg, Oral, EVERY 8 HOURS PRN    omeprazole (PRILOSEC) 40 mg, Oral, DAILY    OTHER MEDICATION 1 each, Does not apply, DAILY, Blood Pressure Monitoring Kit   Arm Circumference LARGE  For use as directed: "Please Dispense AMA validated cuff see [email or web address] (such as Omron)"  DX: hypertension  ICD10: I10    polyethylene glycol (GOLYTELY) 236 g suspension 4,000 mLs, Oral, SEE ADMIN INSTRUCTIONS, Take as directed prior to your colonoscopy    Simethicone 80 MG TABS 4 tablets, Oral, SEE ADMIN INSTRUCTIONS, Take as directed prior to colonoscopy.    spironolactone (ALDACTONE) 50 mg, Oral, DAILY, STOP ATENOLOL, START THIS MEDICATION    torsemide (DEMADEX) 10 mg, Oral, DAILY            Objective :  Physical Exam Vitals:There were no vitals taken for this visit.   A physical exam was not performed due to the nature of this telehealth virtual  visit.    Labs  Lab Results   Component Value Date    NA 140 05/19/2023    K 3.6 05/19/2023    CO2 31 05/19/2023    CREAT 1.3 (H) 05/19/2023    GFR 45 (L) 05/19/2023       Lab Results   Component Value Date    PHOS 3.4 04/07/2023    CA 9.6 05/19/2023    PTHINTACTWC 102 (H) 04/07/2023    VITD25H 30 04/07/2023        Lab Results   Component Value Date    HGB 10.8 (L) 04/07/2023        ALB/CREAT RATIO URINE RAN (ug/mg)   Date Value   05/19/2023 TNP   04/07/2023 37 (H)   12/03/2022 35 (H)       Duplex Renal Arteries- 01/19/23  Conclusions:  Right - The renal artery has evidence of 60-99% stenosis. RI values are elevated indicating intrinsic disease. Kidney length is normal. Renal vein is patent.  Left - The renal artery has evidence of 60-99% stenosis. RI values are elevated indicating intrinsic disease. Kidney length is normal. Renal vein is patent.  Note: SMA has evidence of elevated velocities indicating greater than 70% stenosis.    Assessment and Plan    Proteinuric CKD IIIa: Likely due to longstanding difficult to control hypertension in the setting of bilateral renal artery stenosis.  We discussed the importance of good blood pressure control to prevent the progression of CKD.  Recommend low-salt diet, less than 2 g/day.  Continue ARB for antiproteinuric effect.  Ordered repeat BMP    #BP/Volume management:  Well controlled on current regimen overall, though she states when she os nervous her blood pressure goes up.  -Advised pt to check BPs at home and keep a log.   -Goal: < 130/80  -Continue amlodipine-valsartan (exforge) 5-320 mg daily, spironolactone 50 mg daily, chlorthalidone 25 mg daily, torsemide 10 mg daily    #Anemia of CKD:   On ferrous sulfate 325 mg every other day.   - Ordered repeat CBC, iron studies    #Mineral Bone Disease:   - Continue cholecalciferol 2000 units daily    #Electrolytes/Acid-base:  - Lytes in good order on last labs. BMP ordered.       Patient to return in: 4 weeks, sooner     No  orders of the defined types were placed in this encounter.    There are no Patient Instructions on file for this visit.      Thank you for involving me in the care of this patient. Please do not hesitate to reach out with any questions.    Ilona Sorrel, DO    Pager ID: [phone]    [facility]  [address]  [place], [place] [address]  [phone]

## 2023-08-31 NOTE — Telephone Encounter (Signed)
 Patient's daughter calling requesting a bridge refill on Sara Snyder's Clonazepam. Patient's next appt is not until 10/18/2023    She states her mother has a lot of anxiety and would like to avoid a trip to urgent care.

## 2023-08-31 NOTE — Telephone Encounter (Signed)
 DUPLICATE

## 2023-08-31 NOTE — Telephone Encounter (Signed)
 Spoke with dtr, Avanell Shackleton information  Dtr is upset, empathetic listening.  Sent info for [employer]  Dtr wants to f/u with Dr.Almedida, phone number provided    Paulino Rily, RN, 08/31/2023\

## 2023-08-31 NOTE — Telephone Encounter (Signed)
 Hello,    I received a call from the patient's daughter, very upset.  She is requesting clonazepam, but she mentions "the doctor said she doesn't need it".  The daugher disagrees.  When I asked about dr. Marshia Ly, she said Marshia Ly has left, so I'm routing this message to Inoc[employer].

## 2023-09-01 ENCOUNTER — Encounter (HOSPITAL_BASED_OUTPATIENT_CLINIC_OR_DEPARTMENT_OTHER): Payer: Self-pay

## 2023-09-01 NOTE — Telephone Encounter (Signed)
 This has been addressed multiple times  Per Dr Letitia Neri psychiatry has asked if patient would like to re-engage they can go to [facility]    This prescription will not be safely refilled in primary care, we hae never written it and it appears it was last filled months ago    Hadden Steig R. Delice Bison, MD  09/01/23 11:36 AM

## 2023-09-01 NOTE — Telephone Encounter (Signed)
 This has been addressed multiple times, at this point the daughter has called and messaged over 5 times in 24 hours    It will NOT be refilled in primary care Dr Letitia Neri has clearly documented other options given missed psychiatric appts    Lian Pounds R. Delice Bison, MD  09/01/23 11:40 AM

## 2023-09-01 NOTE — Telephone Encounter (Signed)
 Spoke with patient using a telephone interpreter, [phone]  Relayed message and plan   She can't believe her own doctor won't give her enough medication to get to the apt in April  Explain need to have evaluation  She has apt but not for several more weeks  RN offered the UC options, information in the mychart  She doesn't want to drag her out, doesn't understand why PCP won't help her, putting her mom at risk  Listened to concerns  She will be making a formal complaint  RN to notify provider

## 2023-09-02 ENCOUNTER — Ambulatory Visit: Payer: No Typology Code available for payment source

## 2023-09-02 ENCOUNTER — Encounter (HOSPITAL_BASED_OUTPATIENT_CLINIC_OR_DEPARTMENT_OTHER): Payer: Self-pay | Admitting: Registered Nurse

## 2023-09-02 ENCOUNTER — Other Ambulatory Visit: Payer: Self-pay

## 2023-09-02 ENCOUNTER — Ambulatory Visit: Admitting: Psychiatry - General

## 2023-09-02 VITALS — BP 156/79 | HR 96

## 2023-09-02 DIAGNOSIS — K08109 Complete loss of teeth, unspecified cause, unspecified class: Secondary | ICD-10-CM

## 2023-09-02 DIAGNOSIS — Z5321 Procedure and treatment not carried out due to patient leaving prior to being seen by health care provider: Secondary | ICD-10-CM

## 2023-09-02 NOTE — Progress Notes (Signed)
 [facility]) requesting refill for Lorazepam. She reported being told by her primary care doctor office and the outpatient psychiatry department to come to come to the [facility] for a prescription.    Per [facility] protocol, front desk informed patient that each patient will be evaluated by a Child psychotherapist or psychologist first, regardless of chief complaint. Patient was also informed that, though special exception are made at the determination of the evaluating clinician, most likely client will not be prescribed benzodiazepine. Front desk consulted with this Clinical research associate about their request.    This Clinical research associate briefly reviewed chart and noted that client was last prescribed on 03/16/23 with a 30 day supply and 2 refills. Client did not attend appt with new psychiatrist on 07/19/23. Client does have a documented hx of Klonopin prescription.     Per [facility] protocol, client was offered the initial evaluation with a non-prescribing provider and told that there was no guarantee that they would be prescribed Klonopin per protocol, but possibly other anxiety medication. In addition, they were told they would not be seen today by a prescriber if it was determined appropriate due to a lack of staff. Client declined as they were expecting to be seen by a prescriber today. They requested that their PCP and [facility] prescriber in April were notified about the situation and the [facility] protocol.    This Clinical research associate conducted a further review of chart and consulted with front desk about the timeline of the situation and next possible psychopharm appt, which was Tuesday 3/18 at 4 PM. This writer called client at [phone] and [phone] about scheduling a brief crisis evaluation today/tomorrow with a clinician and possibly a psychopharm appt, if determined necessary, on 3/18 for prescriptions as deemed appropriate by prescriber. LM at 1st number; 2nd number had VM for unknown person. Both called with  interpreter.     Forwarding note to PCP, psychiatrist.

## 2023-09-02 NOTE — Addendum Note (Signed)
 Addended byEdwyna Ready on: 09/02/2023 04:34 PM     Modules accepted: Orders

## 2023-09-02 NOTE — Progress Notes (Signed)
 Daughter [name] walked in requesting to speak with this RN  Reports they just came from [facility] and pt is outside in the car crying  They did not receive the assistance they were hoping for at [facility]  Were instructed to go there and get a prescription for Clonazepam 1 mg at [facility] but this was denied when presented to [facility] - see Dr Weyman Rodney note  Per daughter pt needs this medication for anxiety  The whole family is concerned how this is affecting her health and can raise her blood pressure  Pt is also having dental treatments which affects her anxiety and clonazepam will help  They have an upcoming Psych appt but not until 10/18/23   Also already scheduled with PCP 09/21/23    RN to notify provider    Dairl Ponder, RN, 09/02/2023 1:19 PM

## 2023-09-02 NOTE — Progress Notes (Addendum)
 Patient presented for removable denture appointment -- preliminary impressions for maxillary and mandibular CDs. Denture process has been explained to the patient in detail. Expectations for dentures have been established, and limitations explained.    Patient pain level at beginning of appointment: 0    Interpreter: None    *Medical/Dental History*  Medical history reviewed and updated.    *Denture Treatment*  Prosthesis type: Maxillary and Mandibular Complete Dentures  Denture step(s) performed: Preliminary impression  Impression material used: alginate  Bite registration taken: None   Tooth shade selection: NA  Acrylic base shade selection: NA  Checked and adjusted as needed: NA  Lab: [facility]    *Assessment*  Patient would like to continue with: One-on-one appointments  Consent signed by patient for final processing: No   Assessment/Plan: Upon examination, patient has several bony protuberances on the mandible which are tender to touch and require minor pre-prosthetic alveoloplasty. Patient was informed of this, and that the surgery will delay the denture process. Discussed risks and benefits of treatment options with patient and patient understood.    Patient pain level at end of appointment: 0    Patient satisfied with prosthesis and will continue treatment until complete. Case sent to lab with detailed prescription. Patient left ambulatory, satisfied with treatment. Any remaining planned treatments were explained in detail to the patient.    Assistant: Velvet Bathe  Next visit: What: Alveoloplasty of mandibular protuberances  NNV: Final Impressions  Reason: Pre-prosthetic

## 2023-09-03 ENCOUNTER — Telehealth (HOSPITAL_BASED_OUTPATIENT_CLINIC_OR_DEPARTMENT_OTHER): Payer: Self-pay

## 2023-09-03 DIAGNOSIS — F410 Panic disorder [episodic paroxysmal anxiety] without agoraphobia: Secondary | ICD-10-CM

## 2023-09-03 MED ORDER — CLONAZEPAM 1 MG PO TABS
ORAL_TABLET | ORAL | 0 refills | Status: DC
Start: 2023-09-03 — End: 2023-10-12

## 2023-09-03 NOTE — Telephone Encounter (Signed)
 Please get Sara Snyder's number and I will discuss with her if and when I have time between patients today    In the interim please review the following with patient and daughter:     Patient refused to wait to be evaluated in [facility] and walked out before evaluation and same day or next day psychopharm evaluation    They also called and offered her an appointment 3/18    The patient has NOT been flexible or appropriate with following up  I will write a bridge rx until 3/18 and the protocols in terms of processes followed at the [facility] have to be respected.   The number of outreaches and missed appointments, and the sheer volume of calls and nursing time in the past few days has been disproportionate and not fair to other patients who have urgent issues.     Carlisia Geno R. Delice Bison, MD  09/03/23 12:38 PM

## 2023-09-03 NOTE — Telephone Encounter (Signed)
 Spoke with daughter [name]  Reviewed Dr Levada Dy note  She cannot make it to appt offered 3/18 at [facility]  She is pt's caretaker but also has other commitments that are time consuming  Work is not so flexible  She is already taking time for all other appts pt has such as dental almost on a weekly basis  Also cares for her dad  Stated she has called Psych Dept to move appt up many times but unsuccessful  She is considering switching to a different PCP    Dairl Ponder, RN, 09/03/2023 12:59 PM

## 2023-09-13 ENCOUNTER — Encounter (LOCAL_COMMUNITY_HEALTH_CENTER): Payer: Self-pay

## 2023-09-13 ENCOUNTER — Telehealth (LOCAL_COMMUNITY_HEALTH_CENTER): Payer: Self-pay

## 2023-09-13 NOTE — Telephone Encounter (Signed)
 TW called the patient today to remind them of their [facility] appointment on 09/14/23 with prescriber and confirmed with the daughter Sue Lush) who stated the patient will attend the appointment.    Daughter is also aware that refills will not be granted through [facility] if patient does not keep appointment.    TW mailed [facility] information to patient.

## 2023-09-14 ENCOUNTER — Other Ambulatory Visit (LOCAL_COMMUNITY_HEALTH_CENTER): Payer: Self-pay

## 2023-09-14 ENCOUNTER — Ambulatory Visit: Attending: Psychiatry - General

## 2023-09-14 ENCOUNTER — Other Ambulatory Visit: Payer: Self-pay

## 2023-09-14 DIAGNOSIS — I10 Essential (primary) hypertension: Secondary | ICD-10-CM | POA: Diagnosis present

## 2023-09-14 DIAGNOSIS — F410 Panic disorder [episodic paroxysmal anxiety] without agoraphobia: Secondary | ICD-10-CM | POA: Insufficient documentation

## 2023-09-14 DIAGNOSIS — M25511 Pain in right shoulder: Secondary | ICD-10-CM | POA: Diagnosis present

## 2023-09-14 DIAGNOSIS — G8929 Other chronic pain: Secondary | ICD-10-CM | POA: Insufficient documentation

## 2023-09-14 DIAGNOSIS — F6389 Other impulse disorders: Secondary | ICD-10-CM | POA: Diagnosis present

## 2023-09-14 DIAGNOSIS — F3341 Major depressive disorder, recurrent, in partial remission: Secondary | ICD-10-CM | POA: Diagnosis present

## 2023-09-14 DIAGNOSIS — M25512 Pain in left shoulder: Secondary | ICD-10-CM | POA: Insufficient documentation

## 2023-09-14 DIAGNOSIS — E669 Obesity, unspecified: Secondary | ICD-10-CM | POA: Diagnosis present

## 2023-09-14 DIAGNOSIS — F4310 Post-traumatic stress disorder, unspecified: Secondary | ICD-10-CM

## 2023-09-14 DIAGNOSIS — E559 Vitamin D deficiency, unspecified: Secondary | ICD-10-CM | POA: Insufficient documentation

## 2023-09-14 MED ORDER — ESCITALOPRAM OXALATE 10 MG PO TABS
10.0000 mg | ORAL_TABLET | Freq: Every day | ORAL | 0 refills | Status: DC
Start: 2023-09-14 — End: 2023-10-12

## 2023-09-14 MED ORDER — LORAZEPAM 0.5 MG PO TABS
0.5000 mg | ORAL_TABLET | Freq: Two times a day (BID) | ORAL | 0 refills | Status: DC
Start: 2023-09-14 — End: 2023-10-15
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
## 2023-09-17 ENCOUNTER — Other Ambulatory Visit (LOCAL_COMMUNITY_HEALTH_CENTER): Payer: Self-pay

## 2023-09-17 NOTE — Progress Notes (Unsigned)
.  Patient Name: Sara Snyder  Preferred Phone Number: [phone]  Episodes Linked to this Visit       Name Type Noted    [facility] CW Specialty Psych Case Work 09/17/2023            Providers involved with care (Care Team):  Patient Care Team:  Freddie Breech, MD as PCP - General (Internal Medicine)    Non-providers involved with patient:    Reason for Referral and Resolution of Issues:  Misc  Referral resolution included:  Other case management: [facility]     Summary of Actions Taken to Resolve Issues:   TW called the patient best # listed [phone] daughter's number but was unable to reach the patient TW left a message requesting a callback.

## 2023-09-20 ENCOUNTER — Encounter (LOCAL_COMMUNITY_HEALTH_CENTER): Payer: Self-pay

## 2023-09-20 ENCOUNTER — Other Ambulatory Visit (LOCAL_COMMUNITY_HEALTH_CENTER): Payer: Self-pay

## 2023-09-20 DIAGNOSIS — F6389 Other impulse disorders: Secondary | ICD-10-CM

## 2023-09-20 NOTE — Progress Notes (Signed)
.  Patient Name: Dorothy Polhemus  Preferred Phone Number: [phone]  Episodes Linked to this Visit       Name Type Noted    [facility] CW Specialty Psych Case Work 09/17/2023            Providers involved with care (Care Team):  Patient Care Team:  Freddie Breech, MD as PCP - General (Internal Medicine)    Non-providers involved with patient:    Reason for Referral and Resolution of Issues:  Misc  Referral resolution included:  Other case management: [facility]     Summary of Actions Taken to [employer] Issues:   TW called the patient best # listed and was able to talk to Pt's daughter/Proxy [name]. TW talked to [place] about connecting patient to [facility] and explained services at their [facility] and if they interested we could do a 3 way call for a referral. [name] asked for the information via MyChart to call herself.     MyChart message sent to the patient with the following;    [facility] [place]  Address : [address] [place], [place] [address]   Tel: [phone]    TW let [name] know to callback if they have any questions.  No further outreach. Referral is closed.

## 2023-09-21 ENCOUNTER — Ambulatory Visit

## 2023-10-02 ENCOUNTER — Encounter (HOSPITAL_BASED_OUTPATIENT_CLINIC_OR_DEPARTMENT_OTHER): Payer: Self-pay

## 2023-10-02 DIAGNOSIS — J309 Allergic rhinitis, unspecified: Secondary | ICD-10-CM

## 2023-10-03 MED ORDER — LORATADINE 10 MG PO TABS
10.0000 mg | ORAL_TABLET | Freq: Every day | ORAL | 2 refills | Status: DC
Start: 2023-10-03 — End: 2023-11-29

## 2023-10-03 NOTE — Telephone Encounter (Signed)
 PER Patient (self), Sara Snyder is a 68 year old female has requested a refill of loratadine 10  .      Last OFFICE/TELE Visit:  04-29-23 WITH danielle petrowitz  Last Physical Exam:   06/13/2020     There are no preventive care reminders to display for this patient.    Other Med Adult:  Most Recent BP Reading(s)  09/02/23 : 156/79        Cholesterol (mg/dL)   Date Value   16/03/9603 215     LOW DENSITY LIPOPROTEIN DIRECT (mg/dL)   Date Value   54/02/8118 153     HIGH DENSITY LIPOPROTEIN (mg/dL)   Date Value   14/78/2956 44     TRIGLYCERIDES (mg/dL)   Date Value   21/30/8657 148         THYROID SCREEN TSH REFLEX FT4 (uIU/mL)   Date Value   05/01/2022 3.320         TSH (THYROID STIM HORMONE) (uIU/mL)   Date Value   01/14/2021 7.090 (H)       HEMOGLOBIN A1C (%)   Date Value   12/03/2022 6.9 (H)       No results found for: "POCA1C"      No results found for: "INR"    SODIUM (mmol/L)   Date Value   05/19/2023 140       POTASSIUM (mmol/L)   Date Value   05/19/2023 3.6           CREATININE (mg/dL)   Date Value   84/69/6295 1.3 (H)       Documented patient preferred pharmacies:    [facility] [place] [place] - [address]  Phone: [phone] Fax: [phone]

## 2023-10-07 ENCOUNTER — Other Ambulatory Visit (HOSPITAL_BASED_OUTPATIENT_CLINIC_OR_DEPARTMENT_OTHER): Payer: Self-pay

## 2023-10-07 ENCOUNTER — Other Ambulatory Visit: Payer: Self-pay

## 2023-10-07 ENCOUNTER — Encounter (HOSPITAL_BASED_OUTPATIENT_CLINIC_OR_DEPARTMENT_OTHER): Payer: Self-pay

## 2023-10-07 ENCOUNTER — Ambulatory Visit

## 2023-10-07 VITALS — BP 139/75 | HR 106

## 2023-10-07 DIAGNOSIS — K08109 Complete loss of teeth, unspecified cause, unspecified class: Secondary | ICD-10-CM

## 2023-10-07 NOTE — Progress Notes (Signed)
 Dental procedures in this visit    [id number] - MASTER IMPRESSION 2,3,4,5,6,7,8,9,10,11,12,13,14,15 (Completed)     Service provider: Lynelle Sara, DMD     Billing provider: Lynelle Sara, DMD    [id number] - MASTER IMPRESSION 18,19,20,21,22,23,24,25,26,27,28,29,30,31 (Completed)     Service provider: Lynelle Sara, DMD     Billing provider: Lynelle Sara, DMD     Patient presented for removable denture appointment. Denture process has been explained to the patient in detail. Expectations for dentures have been established, and limitations explained.    Patient pain level at beginning of appointment:     Interpreter: None    *Medical/Dental History*  Medical history reviewed and updated.    *Denture Treatment*  Prosthesis type: Max, Mand Conventional Complete Dentures  Denture step(s) performed: Final Impressions   Impression material used: Monophase purple  Bite registration taken: None  Tooth shade selection: N/A  Acrylic base shade selection: Standard Pink  Checked and adjusted as needed:N/A  Lab: [place]    *Assessment*  Patient would like to continue with: One-on-one appointments  Consent signed by patient for final processing: No   Assessment/Plan: continue as planned  Pt reports intermittent supuration from site #28 extraction zone, no apparent bony spicules today, tender to touch and is a occlusion spot against current max CD - counseled with pt that this will not resolve until new denture delivered    Patient pain level at end of appointment: 2    Patient satisfied with prosthesis and will continue treatment until complete. Case sent to lab with detailed prescription. Patient left ambulatory, satisfied with treatment. Any remaining planned treatments were explained in detail to the patient.    Assistant:     Next visit: [place] Bite

## 2023-10-12 ENCOUNTER — Ambulatory Visit

## 2023-10-12 ENCOUNTER — Other Ambulatory Visit: Payer: Self-pay

## 2023-10-12 DIAGNOSIS — F410 Panic disorder [episodic paroxysmal anxiety] without agoraphobia: Secondary | ICD-10-CM | POA: Diagnosis present

## 2023-10-12 DIAGNOSIS — F6389 Other impulse disorders: Secondary | ICD-10-CM | POA: Insufficient documentation

## 2023-10-12 DIAGNOSIS — F3341 Major depressive disorder, recurrent, in partial remission: Secondary | ICD-10-CM | POA: Diagnosis present

## 2023-10-12 MED ORDER — ESCITALOPRAM OXALATE 10 MG PO TABS
10.0000 mg | ORAL_TABLET | Freq: Every day | ORAL | 0 refills | Status: DC
Start: 2023-10-12 — End: 2023-11-11

## 2023-10-12 NOTE — Progress Notes (Signed)
 ADULT OUTPATIENT PSYCHIATRY (OPD)  PSYCHOPHARM FOLLOW UP    BH TREATMENT TEAM:  No data was found    LANGUAGE OF CARE:    [place] ([place]      LANGUAGE NEEDS MET:    Provider/Staff Proficient in Patient's Language (not English)    CHIEF COMPLAINT: "Anxiety"    INTERVAL HISTORY:   Stopped taking duloxetine  completely 15 days ago and is now taking escitalopram .  Was feeling very anxious despite taking lorazepam  so took her daughter's clonazepam , 2X. Now only taking lorazepam .    Two daughters are here and report that she's a bit more animated.    No [facility] services in her area because lives over an hour away. CM spoke w/ her.     Pt still staying at home. Becomes upset about this. States her leg pain prevents her from walking quickly which she states is what daughters want her to do. Watches soap operas and plays games on her phone.    Gets very upset when talking about another daughter who she feels rejected by. The whole family lives in the same building.    Daughter reports mom is getting better w/ sleep routine. Going to bed earlier and only sleeping at night    Pt and her family want her to have therapy but not switch med management.    OBJECTIVE DATA:  CURRENT MEDICATIONS:    Current Outpatient Medications   Medication Sig    loratadine  (CLARITIN ) 10 MG tablet Take 1 tablet by mouth daily    escitalopram  (LEXAPRO ) 10 MG tablet Take 1 tablet by mouth daily With breakfast    LORazepam  (ATIVAN ) 0.5 MG tablet Take 1 tablet by mouth in the morning and 1 tablet before bedtime.    clonazePAM  (KLONOPIN ) 1 MG tablet 1 tablet two times daily, PRN  bridge prescription until psychiatry appointment.    amlodipine -valsartan  (EXFORGE ) 5-320 MG per tablet Take 1 tablet by mouth daily    latanoprost  (XALATAN ) 0.005 % ophthalmic solution Place 1 drop into both eyes nightly    chlorthalidone  (HYGROTEN) 25 MG tablet Take 1 tablet by mouth daily    meclizine  (ANTIVERT ) 25 MG TABS Take 1 tablet by mouth every 8 (eight) hours as  needed    torsemide  (DEMADEX ) 10 MG tablet Take 1 tablet by mouth daily    omeprazole  (PRILOSEC) 40 MG capsule Take 1 capsule by mouth daily    ferrous sulfate  325 (65 FE) MG tablet Take 1 tablet by mouth every other day (Patient not taking: Reported on 09/14/2023)    cholecalciferol  (VITAMIN D3) 2000 UNIT tablet Take 1 tablet by mouth in the morning and 1 tablet before bedtime. (Patient not taking: Reported on 09/14/2023)    calcium  citrate-citamin D (CALCIUM  CITRATE +) 315-5 MG-MCG tablet Take 1 tablet by mouth in the morning and 1 tablet before bedtime. (Patient not taking: Reported on 09/14/2023)    OTHER MEDICATION Use 1 each As Directed daily Blood Pressure Monitoring Kit   Arm Circumference LARGE  For use as directed: "Please Dispense AMA validated cuff see [email or web address] (such as [employer])"       DX: hypertension  ICD10: I10    Blood Pressure KIT Use 1 kit As Directed daily before breakfast Regular ICD i10    spironolactone  (ALDACTONE ) 50 MG tablet Take 1 tablet by mouth in the morning. STOP ATENOLOL , START THIS MEDICATION.    atorvastatin  (LIPITOR) 80 MG tablet Take 1 tablet by mouth in the morning. (Patient not taking: Reported on 09/14/2023)  levothyroxine  (SYNTHROID ) 50 MCG tablet Take 1 tablet by mouth every morning before breakfast (Patient not taking: Reported on 09/14/2023)    budesonide -formoterol  (SYMBICORT ) 160-4.5 MCG/ACT inhaler Inhale 2 puffs into the lungs in the morning and 2 puffs before bedtime. Use 1 puff prn during the day. Rinse mouth after use..     No current facility-administered medications for this visit.         VITAL SIGNS:   There were no vitals filed for this visit.    LABS:    WHITE BLOOD CELL COUNT (TH/uL)   Date Value   04/07/2023 6.7     RED BLOOD CELL COUNT (M/uL)   Date Value   04/07/2023 4.03     HEMOGLOBIN (g/dL)   Date Value   46/96/2952 10.8 (L)     No results found for: "IDEALHCT"  HEMATOCRIT (%)   Date Value   04/07/2023 34.3     No results found for: "HCTDIFF"  MEAN  CORPUSCULAR VOL (fl)   Date Value   04/07/2023 85.1     MEAN CORPUSCULAR HGB (pg)   Date Value   04/07/2023 26.8     MEAN CORP HGB CONC (g/dL)   Date Value   84/13/2440 31.5     No results found for: "RDW"  PLATELET COUNT (TH/uL)   Date Value   04/07/2023 297     MEAN PLATELET VOLUME (fL)   Date Value   04/07/2023 10.8     NEUTROPHIL % (%)   Date Value   11/13/2019 52.6     LYMPHOCYTE % (%)   Date Value   11/13/2019 39.3     MONOCYTE % (%)   Date Value   11/13/2019 5.4     EOSINOPHIL % (%)   Date Value   11/13/2019 1.9     BASOPHIL % (%)   Date Value   11/13/2019 0.4         ALBUMIN (g/dL)   Date Value   04/18/2535 4.2     ALKALINE PHOSPHATASE (U/L)   Date Value   12/03/2022 110     ALANINE AMINOTRANSFERASE (U/L)   Date Value   12/03/2022 18     ASPARTATE AMINOTRANSFERASE (U/L)   Date Value   12/03/2022 22     Glucose Random (mg/dL)   Date Value   64/40/3474 95     BUN (UREA NITROGEN) (mg/dL)   Date Value   25/95/6387 19 (H)     CALCIUM  (mg/dL)   Date Value   56/43/3295 9.6     CHLORIDE (mmol/L)   Date Value   05/19/2023 96 (L)     CARBON DIOXIDE (mmol/L)   Date Value   05/19/2023 31     CREATININE (mg/dL)   Date Value   18/84/1660 1.3 (H)     POTASSIUM (mmol/L)   Date Value   05/19/2023 3.6     SODIUM (mmol/L)   Date Value   05/19/2023 140     BILIRUBIN TOTAL (mg/dL)   Date Value   63/06/6008 0.2     TOTAL PROTEIN (g/dL)   Date Value   93/23/5573 7.2          THYROID  SCREEN TSH REFLEX FT4 (uIU/mL)   Date Value   05/01/2022 3.320     Cholesterol (mg/dL)   Date Value   22/07/5425 215   06/13/2020 215   04/10/2019 223     LOW DENSITY LIPOPROTEIN DIRECT (mg/dL)   Date Value   12/13/7626 153  06/13/2020 141   04/10/2019 159     HIGH DENSITY LIPOPROTEIN (mg/dL)   Date Value   32/95/1884 44   06/13/2020 43   04/10/2019 43     TRIGLYCERIDES (mg/dL)   Date Value   16/60/6301 148   04/10/2019 169 (H)   10/27/2017 243 (H)         HEMOGLOBIN A1C (%)   Date Value   12/03/2022 6.9 (H)   05/25/2022 6.3 (H)   02/24/2022 6.4 (H)      No results found for: "POCA1C"       RPR QUAL (no units)   Date Value   07/20/2019 Non Reactive     RPR QUALITATIVE (no units)   Date Value   01/24/2020 NON-REACTIVE                Pregnancy/Birth Control (for female patients): post menopausal    CURRENT TREATMENT/CONTACT INFO FOR OTHER AGENCIES AND MENTAL HEALTH PROVIDERS (if applicable):     Contact/Community Support    No documentation.                   COLUMBIARISK ASSESSMENTS:     Suicide:  [facility] OP LAST CONTACT      Flowsheet Row BH Televisit from 06/30/2023 [facility]   Have you wished you were dead or wished you could go to sleep and not wake up? No   Have you actually had any thoughts of killing yourself?  No   Have you done anything, started to do anything, or prepared to do anything to end your life? No   C-SSRS OP Last Contact Screener Risk Score No Risk           C-SSRS Risk Assessment    No documentation.                      VIOLENCE:    Violence/Abuse Risk    No documentation.                   ADDITIONAL SCREENINGS:       09/14/2023 12/03/2022 11/06/2021   PHQ-9 Screening   Patient refused PHQ-9   No    Anhedonia 1 3 1      0     Low mood/hopelessness 1 3 1     0    Sleep disturbance 1 1 0   Fatigue 1 1 1    Appetite disturbance 0 0 0   Low self-worth 0 0 0   Impaired concentration 0 0 0   Psychomotor retardation/agitation 0 0 0   Suicidal ideation 0 0 0   PHQ-9 TOTAL 4  8 3     Problem difficulty   Not difficult at all       Patient-reported    Proxy-reported    Data saved with a previous flowsheet row definition    Multiple values from one day are sorted in reverse-chronological order            09/14/2023 12/03/2022 11/06/2021   GAD-7 Screening   Nervousness 1 3 1    1     Uncontrollable worry 1 3 0    0    Excessive worry 1     Trouble relaxing 1     Restlessness 0     Irritability 1     Fears of future 1     Difficulty functioning Not difficult at all     GAD-7 Score 6  Patient-reported    Proxy-reported    Multiple  values from one day are sorted in reverse-chronological order           09/14/2023 12/03/2022 11/06/2021   ALCOHOL/DRUG SCREENING   Frequency of 4+ drinks/day past year (Over 65)  0 0    0    Frequency of drug use past year 0 0 0    0        Proxy-reported    Multiple values from one day are sorted in reverse-chronological order           11/17/2017   ALCOHOL (AUDIT) Screening   Frequency of 4+ drinks/day 0        Data saved with a previous flowsheet row definition         09/14/2023 12/03/2022 11/06/2021   DRUG (DAST) Screening   Drug use frequency 0 0 0    0        Proxy-reported    Multiple values from one day are sorted in reverse-chronological order         12/03/2022   SDOH Screening   Patient willing to answer questions Yes   Worry food will run out Never   Food won't last Never   Threatend to shut utilities No   Trouble getting transportation No   Living situation today I have a steady place to live.   Internet access Yes, on a computer,Yes, on my phone   Experienced violence No   OK for [place] to reach out No   Referral to community programs No       REVIEW OF SYSTEMS:        MENTAL STATUS EXAMINATION:   Mental Status Exam    No documentation.                   RISK ASSESSMENTS:       Violence: low (1)     Addiction: moderate (2)     BIO/PSYCHO/SOCIAL AND RISK FORMULATION(S):  68 yr old cis gendered married [place] female, mother of 5 adult children, grand mother, unemployed X 19 yrs, w/hx of trauma, depression, panic disorder, no IPLOC or SU, fam hx of depression, anxiety, transfer from provider who left [place] 9 mo ago, 2 daughters present today contribute to history, endorses anhedonia, irritability, body pain, dizziness, complete sleep dysregulation d/t playing video games all night long and sleeping in the day, good appetite, no exercise, no interests. Requests increase in clonazepam  d/t staying alone all day and feeling anxious.     Prior med trials: duloxetine  X 7 mo, elavil , clonazepam  X age 64 ( 46 yrs),  gabapentin , sertraline , trazodone , venlafaxine , fluoxetine- in [place], helped     ASSESSMENT:  Stopped taking duloxetine  completely 15 days ago and is now taking escitalopram .  Was feeling very anxious despite taking lorazepam  so took her daughter's clonazepam , 2X. Now only taking lorazepam .  Two daughters are here and report that she's a bit more animated    DIAGNOSES:    No diagnosis found.    PLAN:     Continue esctilopram 10 mg with breakfast.  Continue  lorazepam  d/t safety profile in older adults.   Encouraged exercise again. Daughter states she's looking into the [employer].  Encouraged getting out of the house even if only for short, slow walks in the sun.  NP will refer to therapy in [facility]..  RTC in 1 mo.      INFORMED CONSENT - for any new medication:  N/A     COUNSELING AND COORDINATION OF CARE PROVIDED:   I spent a total of  30  minutes on this visit on the date of service (total time includes all activities performed on the date of service)    CARE PLAN/ EPISODES:  No linked episodes             Ernesto Heady. Haight-Carter, APRN

## 2023-10-14 ENCOUNTER — Other Ambulatory Visit (LOCAL_COMMUNITY_HEALTH_CENTER): Payer: Self-pay

## 2023-10-15 ENCOUNTER — Encounter (HOSPITAL_BASED_OUTPATIENT_CLINIC_OR_DEPARTMENT_OTHER): Payer: Self-pay

## 2023-10-15 ENCOUNTER — Telehealth (HOSPITAL_BASED_OUTPATIENT_CLINIC_OR_DEPARTMENT_OTHER): Payer: Self-pay

## 2023-10-15 DIAGNOSIS — F410 Panic disorder [episodic paroxysmal anxiety] without agoraphobia: Secondary | ICD-10-CM

## 2023-10-15 MED ORDER — LORAZEPAM 0.5 MG PO TABS
0.5000 mg | ORAL_TABLET | Freq: Two times a day (BID) | ORAL | 0 refills | Status: DC
Start: 2023-10-15 — End: 2023-11-11

## 2023-10-15 NOTE — Telephone Encounter (Signed)
 PER Pharmacy, Sara Snyder is a 68 year old female has requested a refill of Lorazepam  0.5 mg.    Start Date: 09/14/23 End Date: 10/14/23 after 60 doses   Written Date: 09/14/23       [facility] Visit: 10/12/2023  with Haight-Carter, S  Last Physical Exam: 2021      Other Med Adult:  Most Recent BP Reading(s)  10/07/23 : 139/75        Cholesterol (mg/dL)   Date Value   29/52/8413 215     LOW DENSITY LIPOPROTEIN DIRECT (mg/dL)   Date Value   24/40/1027 153     HIGH DENSITY LIPOPROTEIN (mg/dL)   Date Value   25/36/6440 44     TRIGLYCERIDES (mg/dL)   Date Value   34/74/2595 148         THYROID  SCREEN TSH REFLEX FT4 (uIU/mL)   Date Value   05/01/2022 3.320         TSH (THYROID  STIM HORMONE) (uIU/mL)   Date Value   01/14/2021 7.090 (H)       HEMOGLOBIN A1C (%)   Date Value   12/03/2022 6.9 (H)       No results found for: "POCA1C"      No results found for: "INR"    SODIUM (mmol/L)   Date Value   05/19/2023 140       POTASSIUM (mmol/L)   Date Value   05/19/2023 3.6           CREATININE (mg/dL)   Date Value   63/87/5643 1.3 (H)       Documented patient preferred pharmacies:    [facility] [place] [place] - [address]  Phone: [phone] Fax: [phone]

## 2023-10-15 NOTE — Telephone Encounter (Signed)
 PER Patient (self),NAME@ is a 68 year old female has requested a refill of ativan       Last prescribed - start date: 09/14/23 end date: 10/15/23     Last Office Visit: 04/29/23    There are no preventive care reminders to display for this patient.     Other Med Adult:  Most Recent BP Reading(s)  10/07/23 : 139/75        Cholesterol (mg/dL)   Date Value   98/04/9146 215     LOW DENSITY LIPOPROTEIN DIRECT (mg/dL)   Date Value   82/95/6213 153     HIGH DENSITY LIPOPROTEIN (mg/dL)   Date Value   08/65/7846 44     TRIGLYCERIDES (mg/dL)   Date Value   96/29/5284 148         THYROID  SCREEN TSH REFLEX FT4 (uIU/mL)   Date Value   05/01/2022 3.320         TSH (THYROID  STIM HORMONE) (uIU/mL)   Date Value   01/14/2021 7.090 (H)       HEMOGLOBIN A1C (%)   Date Value   12/03/2022 6.9 (H)       No results found for: "POCA1C"      No results found for: "INR"    SODIUM (mmol/L)   Date Value   05/19/2023 140       POTASSIUM (mmol/L)   Date Value   05/19/2023 3.6           CREATININE (mg/dL)   Date Value   13/24/4010 1.3 (H)        Documented patient preferred pharmacies:    [facility] [place], [place] - [address]  Phone: [phone] Fax: [phone]

## 2023-10-15 NOTE — Telephone Encounter (Signed)
 Called pt's daughter to schedule f/u. No ans, lvm w/ appt info

## 2023-10-18 ENCOUNTER — Ambulatory Visit (HOSPITAL_BASED_OUTPATIENT_CLINIC_OR_DEPARTMENT_OTHER): Payer: No Typology Code available for payment source
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
## 2023-10-20 ENCOUNTER — Other Ambulatory Visit: Payer: Self-pay

## 2023-10-20 ENCOUNTER — Ambulatory Visit: Attending: Psychiatry - General | Admitting: Physician Assistant

## 2023-10-20 VITALS — BP 137/81 | HR 83 | Temp 97.0°F | Resp 18 | Ht 58.27 in | Wt 198.0 lb

## 2023-10-20 DIAGNOSIS — R7303 Prediabetes: Secondary | ICD-10-CM | POA: Diagnosis not present

## 2023-10-20 DIAGNOSIS — F4310 Post-traumatic stress disorder, unspecified: Secondary | ICD-10-CM | POA: Diagnosis present

## 2023-10-20 DIAGNOSIS — Z6841 Body Mass Index (BMI) 40.0 and over, adult: Secondary | ICD-10-CM

## 2023-10-20 DIAGNOSIS — R079 Chest pain, unspecified: Secondary | ICD-10-CM | POA: Diagnosis present

## 2023-10-20 DIAGNOSIS — N1831 Chronic kidney disease, stage 3a: Secondary | ICD-10-CM | POA: Diagnosis present

## 2023-10-20 DIAGNOSIS — F410 Panic disorder [episodic paroxysmal anxiety] without agoraphobia: Secondary | ICD-10-CM

## 2023-10-20 DIAGNOSIS — R0609 Other forms of dyspnea: Secondary | ICD-10-CM | POA: Diagnosis present

## 2023-10-20 DIAGNOSIS — I209 Angina pectoris, unspecified: Secondary | ICD-10-CM | POA: Diagnosis not present

## 2023-10-20 DIAGNOSIS — I5032 Chronic diastolic (congestive) heart failure: Secondary | ICD-10-CM | POA: Insufficient documentation

## 2023-10-20 DIAGNOSIS — R002 Palpitations: Secondary | ICD-10-CM | POA: Diagnosis present

## 2023-10-20 DIAGNOSIS — I498 Other specified cardiac arrhythmias: Secondary | ICD-10-CM

## 2023-10-20 LAB — COMPREHENSIVE METABOLIC PANEL
ALANINE AMINOTRANSFERASE: 17 U/L (ref 12–45)
ALBUMIN: 4.3 g/dL (ref 3.4–5.2)
ALKALINE PHOSPHATASE: 120 U/L — ABNORMAL HIGH (ref 45–117)
ANION GAP: 13 mmol/L (ref 10–22)
ASPARTATE AMINOTRANSFERASE: 21 U/L (ref 8–34)
BILIRUBIN TOTAL: 0.4 mg/dL (ref 0.2–1.0)
BUN (UREA NITROGEN): 23 mg/dL — ABNORMAL HIGH (ref 7–18)
CALCIUM: 9.7 mg/dL (ref 8.5–10.5)
CARBON DIOXIDE: 29 mmol/L (ref 21–32)
CHLORIDE: 93 mmol/L — ABNORMAL LOW (ref 98–107)
CREATININE: 1.5 mg/dL — ABNORMAL HIGH (ref 0.4–1.2)
ESTIMATED GLOMERULAR FILT RATE: 37 mL/min — ABNORMAL LOW (ref >60)
Glucose Random: 104 mg/dL (ref 74–160)
POTASSIUM: 3.4 mmol/L — ABNORMAL LOW (ref 3.5–5.1)
SODIUM: 136 mmol/L (ref 136–145)
TOTAL PROTEIN: 7.3 g/dL (ref 6.4–8.2)

## 2023-10-20 LAB — CBC, PLATELET & DIFFERENTIAL
ABSOLUTE BASO COUNT: 0 10*3/uL (ref 0.0–0.1)
ABSOLUTE EOSINOPHIL COUNT: 0.1 10*3/uL (ref 0.0–0.8)
ABSOLUTE IMM GRAN COUNT: 0.03 10*3/uL (ref 0.00–0.10)
ABSOLUTE LYMPH COUNT: 2.1 10*3/uL (ref 0.6–5.9)
ABSOLUTE MONO COUNT: 0.4 10*3/uL (ref 0.2–1.4)
ABSOLUTE NEUTROPHIL COUNT: 4.8 10*3/uL (ref 1.6–8.3)
ABSOLUTE NRBC COUNT: 0 10*3/uL (ref 0.0–0.0)
BASOPHIL %: 0.4 % (ref 0.0–1.2)
EOSINOPHIL %: 1.5 % (ref 0.0–7.0)
HEMATOCRIT: 33.3 % — ABNORMAL LOW (ref 34.1–44.9)
HEMOGLOBIN: 11 g/dL — ABNORMAL LOW (ref 11.2–15.7)
IMMATURE GRANULOCYTE %: 0.4 % (ref 0.0–1.0)
LYMPHOCYTE %: 27.6 % (ref 15.0–54.0)
MEAN CORP HGB CONC: 33 g/dL (ref 31.0–37.0)
MEAN CORPUSCULAR HGB: 28.4 pg (ref 26.0–34.0)
MEAN CORPUSCULAR VOL: 86 fl (ref 80.0–100.0)
MEAN PLATELET VOLUME: 10.8 fL (ref 8.7–12.5)
MONOCYTE %: 5.8 % (ref 4.0–13.0)
NEUTROPHIL %: 64.3 % (ref 40.0–75.0)
NRBC %: 0 % (ref 0.0–0.0)
PLATELET COUNT: 317 10*3/uL (ref 150–400)
RBC DISTRIBUTION WIDTH STD DEV: 40.8 fL (ref 35.1–46.3)
RED BLOOD CELL COUNT: 3.87 M/uL — ABNORMAL LOW (ref 3.90–5.20)
WHITE BLOOD CELL COUNT: 7.5 10*3/uL (ref 4.0–11.0)

## 2023-10-20 LAB — THYROID SCREEN TSH REFLEX FT4: THYROID SCREEN TSH REFLEX FT4: 3.43 u[IU]/mL (ref 0.270–4.200)

## 2023-10-20 LAB — MICROALBUMIN RANDOM URINE
ALB/CREAT RATIO URINE RAN: 23 ug/mg (ref 0–30)
ALBUMIN URINE RANDOM: 3.3 mg/dL — ABNORMAL HIGH (ref 0.0–1.9)
CREATININE RANDOM URINE: 145 mg/dL (ref 28–217)

## 2023-10-20 LAB — TOTAL IRON BINDING CAPACITY: TOTAL IRON BIND CAPACITY CALC: 410 ug/dL (ref 280–504)

## 2023-10-20 LAB — VITAMIN B12: VITAMIN B12: 276 pg/mL (ref 232–1245)

## 2023-10-20 LAB — NT-PROBNP: NT-proBNP: 182 pg/mL — ABNORMAL HIGH (ref 0–125)

## 2023-10-20 LAB — URINALYSIS
BILIRUBIN, URINE: NEGATIVE
GLUCOSE, URINE: NEGATIVE mg/dL
KETONE, URINE: NEGATIVE mg/dL
LEUKOCYTES, URINE: NEGATIVE
NITRITE, URINE: NEGATIVE
OCCULT BLOOD, URINE: NEGATIVE
PH, URINE: 7.5 (ref 5.0–8.0)
PROTEIN, URINE: NEGATIVE mg/dL
SPECIFIC GRAVITY, URINE: 1.016 (ref 1.003–1.035)
UROBILINOGEN, URINE: 1 U/dL (ref 0.2–1.0)

## 2023-10-20 LAB — IRON: IRON: 47 ug/dL — ABNORMAL LOW (ref 50–170)

## 2023-10-20 LAB — MAGNESIUM: MAGNESIUM: 1.8 mg/dL (ref 1.6–2.6)

## 2023-10-20 LAB — PTH INTACT WITH CALCIUM
PTH INTACT CALCIUM: 9.7 mg/dL (ref 8.5–10.5)
PTH INTACT: 124 pg/mL — ABNORMAL HIGH (ref 15–65)

## 2023-10-20 LAB — PHOSPHORUS: PHOSPHORUS: 3.4 mg/dL (ref 2.5–4.9)

## 2023-10-20 LAB — FERRITIN: FERRITIN: 19 ng/mL (ref 13–150)

## 2023-10-20 LAB — POC A1C: POC HEMOGLOBIN A1C: 6.5 % — ABNORMAL HIGH (ref 4–5.6)

## 2023-10-20 LAB — VITAMIN D,25 HYDROXY: VITAMIN D,25 HYDROXY: 34 ng/mL (ref 30.0–100.0)

## 2023-10-20 MED ORDER — NITROGLYCERIN 0.4 MG SL SUBL
0.4000 mg | SUBLINGUAL_TABLET | SUBLINGUAL | 0 refills | Status: DC | PRN
Start: 2023-10-20 — End: 2024-02-01

## 2023-10-20 NOTE — Progress Notes (Signed)
 The patient verbally consented to an audio recording of their visit to assist with the completion of documentation. The patient is aware the recording is not retained after the visit is summarized.      Subjective     History of Present Illness  Presents with daughter, Robie Cho, who helps provide hx.  The patient is a 68 year old with asthma who presents with chest pain and shortness of breath.    She has been experiencing chest pain, shortness of breath, and weakness, particularly during showers, for approximately two months. These symptoms occur almost daily without a specific trigger. The chest pain is described as an 'achy' sensation localized to the chest, without radiation to the back or neck. Episodes last about 15 minutes and are sometimes accompanied by a sensation of heart pounding.    On April 23, she experienced a significant episode while showering, during which she felt weak, short of breath, and had chest pain. Her blood pressure was recorded at 74/44 mmHg with a heart rate of 60 bpm. Approximately 10 minutes later, her blood pressure stabilized to 122/68 mmHg and heart rate increased to 101 bpm. She felt better after this episode but was unable to continue her usual activities, such as cooking, due to feeling unwell and traumatized.  Typically, home BPs range from 104-137 systolic.     She has a history of asthma and reports occasional wheezing, but notes that her current shortness of breath is different from her asthma symptoms. She has not used her inhaler for about two years. She notes increased swelling in her legs following the shower episode, which she attributes to fluid retention.    She reports a history of heart palpitations since August of the previous year, which occur occasionally and are described as her heart beating faster and stronger.      Last echo 12/2022 (reviewed)  Study Result    Narrative & Impression   Conclusions: (click link below for full report)  1. Left  Ventricle: Global systolic function: EF is estimated visually at 60% .  2. Left Ventricle: Regional systolic function: Wall motion: There are no regional wall motion abnormalities.  3. Left Ventricle: Diastolic function: There is grade II diastolic dysfunction.  4. Left Atrium: Moderately dilated.  5. Aortic Valve: There is mild sclerosis; no stenosis. There is mild aortic regurgitation.  6. Tricuspid Valve: The RVSP is estimated at 30 mmHg which is normal.  7. Pericardium: There is no pericardial effusion.       Most Recent Weight Reading(s)  10/20/23 : 89.8 kg (198 lb)  07/20/23 : 88.9 kg (196 lb)  06/01/23 : 91.6 kg (202 lb)  05/19/23 : 91.7 kg (202 lb 3.2 oz)  05/06/23 : 91.5 kg (201 lb 12.8 oz)    Reviewed last psych note 4/22    HTN, CKD -nephrology following, reviewed last note.  Microalb/creat normal 04/2023    Most Recent BP Reading(s)  10/20/23 : 137/81  10/07/23 : 139/75  09/02/23 : 156/79  07/29/23 : 137/70  07/20/23 : 174/83      Cholesterol (mg/dL)   Date Value   16/03/9603 215     LOW DENSITY LIPOPROTEIN DIRECT (mg/dL)   Date Value   54/02/8118 153     HIGH DENSITY LIPOPROTEIN (mg/dL)   Date Value   14/78/2956 44     TRIGLYCERIDES (mg/dL)   Date Value   21/30/8657 148           Component  Ref Range &  Units 05/01/22 1609   THYROID  SCREEN TSH REFLEX FT4  0.270 - 4.200 uIU/mL 3.320          She takes several medications including levothyroxine , chlorthalidone , meclizine , torsemide , calcium  D315, and lorazepam  0.5 mg twice daily. She does not take any medication for diabetes.     HEMOGLOBIN A1C (%)   Date Value   12/03/2022 6.9 (H)   05/25/2022 6.3 (H)   02/24/2022 6.4 (H)       POC HEMOGLOBIN A1C (%)   Date Value   10/20/2023 6.5 (H)                 Patient Active Problem List:     Essential hypertension     Labyrinthitis     Panic disorder     Hyperlipidemia     Mild episode of recurrent major depressive disorder (HCC)     Allergic rhinitis     Chronic pain of both shoulders     Stage 3a chronic kidney  disease     Anxiety state     Psychophysiological insomnia     Secondary hyperparathyroidism (HCC)     Osteopenia     Carpal tunnel syndrome of right wrist     Fibromyalgia     Hair loss     Vitamin D  deficiency     Obesity (BMI 30-39.9)     Bilateral renal artery stenosis (HCC)     DOE (dyspnea on exertion)     Recurrent epistaxis     Gastroesophageal reflux disease without esophagitis     Other specified hypothyroidism     Combined forms of age-related cataract of right eye     Cervical cancer screening     Prediabetes     Healthcare maintenance     Schistosomiasis     Intraoperative floppy iris syndrome (IFIS)     Combined forms of age-related cataract of left eye     Varicose veins of calf     Chronic bilateral low back pain without sciatica     Morbid obesity with BMI 40.0-44.9, adult (HCC)     Abnormal mammogram     Tubular adenoma of colon    LORazepam  (ATIVAN ) 0.5 MG tablet, Take 1 tablet by mouth in the morning and 1 tablet before bedtime., Disp: 60 tablet, Rfl: 0  escitalopram  (LEXAPRO ) 10 MG tablet, Take 1 tablet by mouth daily With breakfast, Disp: 30 tablet, Rfl: 0  loratadine  (CLARITIN ) 10 MG tablet, Take 1 tablet by mouth daily, Disp: 30 tablet, Rfl: 2  amlodipine -valsartan  (EXFORGE ) 5-320 MG per tablet, Take 1 tablet by mouth daily, Disp: 30 tablet, Rfl: 2  latanoprost  (XALATAN ) 0.005 % ophthalmic solution, Place 1 drop into both eyes nightly (Patient not taking: Reported on 10/12/2023), Disp: 2.5 mL, Rfl: 11  chlorthalidone  (HYGROTEN) 25 MG tablet, Take 1 tablet by mouth daily, Disp: 90 tablet, Rfl: 3  meclizine  (ANTIVERT ) 25 MG TABS, Take 1 tablet by mouth every 8 (eight) hours as needed, Disp: 30 tablet, Rfl: 5  torsemide  (DEMADEX ) 10 MG tablet, Take 1 tablet by mouth daily, Disp: 90 tablet, Rfl: 3  omeprazole  (PRILOSEC) 40 MG capsule, Take 1 capsule by mouth daily, Disp: 90 capsule, Rfl: 3  ferrous sulfate  325 (65 FE) MG tablet, Take 1 tablet by mouth every other day (Patient not taking: Reported  on 09/14/2023), Disp: 15 tablet, Rfl: 11  cholecalciferol  (VITAMIN D3) 2000 UNIT tablet, Take 1 tablet by mouth in the morning and 1 tablet before bedtime. (Patient not  taking: Reported on 09/14/2023), Disp: 180 tablet, Rfl: 3  calcium  citrate-citamin D (CALCIUM  CITRATE +) 315-5 MG-MCG tablet, Take 1 tablet by mouth in the morning and 1 tablet before bedtime. (Patient not taking: Reported on 09/14/2023), Disp: 180 tablet, Rfl: 3  OTHER MEDICATION, Use 1 each As Directed daily Blood Pressure Monitoring Kit   Arm Circumference LARGE  For use as directed: "Please Dispense AMA validated cuff see [employer] (such as [employer])"       DX: hypertension  ICD10: I10, Disp: 1 each, Rfl: 0  Blood Pressure KIT, Use 1 kit As Directed daily before breakfast Regular ICD i10, Disp: 1 kit, Rfl: 0  spironolactone  (ALDACTONE ) 50 MG tablet, Take 1 tablet by mouth in the morning. STOP ATENOLOL , START THIS MEDICATION., Disp: 30 tablet, Rfl: 2  atorvastatin  (LIPITOR) 80 MG tablet, Take 1 tablet by mouth in the morning. (Patient not taking: Reported on 09/14/2023), Disp: 90 tablet, Rfl: 3  levothyroxine  (SYNTHROID ) 50 MCG tablet, Take 1 tablet by mouth every morning before breakfast (Patient not taking: Reported on 09/14/2023), Disp: 90 tablet, Rfl: 3  budesonide -formoterol  (SYMBICORT ) 160-4.5 MCG/ACT inhaler, Inhale 2 puffs into the lungs in the morning and 2 puffs before bedtime. Use 1 puff prn during the day. Rinse mouth after use.., Disp: 1 each, Rfl: 1    No current facility-administered medications for this visit.    Review of Patient's Allergies indicates:   Pollen extract          Cough  Social History     Socioeconomic History    Marital status: Married     Spouse name: Not on file    Number of children: Not on file    Years of education: Not on file    Highest education level: Not on file   Occupational History    Not on file   Tobacco Use    Smoking status: Former     Current packs/day: 0.00     Average packs/day: 2.0 packs/day  for 0.3 years (0.7 ttl pk-yrs)     Types: Cigarettes     Start date: 57     Quit date: 10/27/1977     Years since quitting: 46.0    Smokeless tobacco: Never   Substance and Sexual Activity    Alcohol use: Not Currently    Drug use: Never    Sexual activity: Yes     Partners: Male   Other Topics Concern    Not on file   Social History Narrative    Originally from [place], [place]    History of childhood trauma    Lives with family   Social Drivers of Catering manager Strain: Not on file  Food Insecurity: Not on file  Transportation Needs: Not on file  Physical Activity: Not on file  Stress: Not on file  Social Connections: Not on file  Intimate Partner Violence: Not on file  Housing Stability: Not on file  Past Surgical History:  No date: CATARACT REMOVAL INSERTION OF LENS; Right  10/2016: HEMORRHOID SURGERY  No date: OB ANTEPARTUM CARE CESAREAN DLVR & POSTPARTUM  No family history on file.           Objective     All ROS reviewed and discussed and are otherwise negative unless listed above.       PHYSICAL EXAM:  BP 137/81 (Site: LA, Position: Sitting, Cuff Size: Lrg)   Pulse 83   Temp 97 F (36.1 C) (Temporal)   Resp 18  Ht 4' 10.27" (1.48 m)   Wt 89.8 kg (198 lb)   SpO2 97%   BMI 41.00 kg/m     GENERAL APPEARANCE - A&Ox3, WDWN, NAD  HEENT - head normocephalic, atraumatic, EOMI, PERRLA, conjunctiva clear bilaterally, no occular discharge.   NECK - Neck soft, supple. No ant/posterior cervical or supraclavicular LAD .   LUNGS - Lungs CTATB without WRR. Normal inspiratory effort.   CARDIOVASCULAR -  RRR without S3, S4. No murmurs, clicks, gallops or rubs.  Extrem - no tremor or edema.  SKIN - skin color, texture, temperature, and turgor are normal in the exposed areas without rash or concerning lesions      Assessment & Plan  1. Chest pain, unspecified type  Intermittent chest pain with shortness of breath, abnormal EKG rhythm, possible cardiac arrhythmia or anxiety-related. Recent episode with  hypotension and bradycardia. Fear and trauma expressed. Nitro rx for future episodes of CP, heaviness   - Perform EKG, no acute concerns  - Order Holter monitor study for 24-hour cardiac rhythm evaluation.  - Refer to cardiology (urgent) for further evaluation.  - Advise minimizing caffeine intake and maintaining hydration.  - Instruct to take all regular medications as prescribed.  - EKG  - NT-PROBNP  - nitroGLYCERIN  (NITROSTAT ) 0.4 MG sublingual tablet; Place 1 tablet under the tongue every 5 (five) minutes as needed for Chest pain  Dispense: 30 tablet; Refill: 0  - REFERRAL TO CARDIOLOGY (INT)  - HOLTER MONITORING; Future    2. Chronic heart failure with preserved ejection fraction (HCC)  Hypertension stable on current medication. Recent hypotension during chest pain episode, resolved. Suspect may need med adjustment, will defer to cardio given complexity    3. Morbid obesity with body mass index of 40.0-44.9 in adult (HCC)  Attn diet/exercise, weight loss. Consider GLP1 given DM2 if no contraindications, can discuss with PCP    4. Chronic dyspnea  Chronic wheezing not relieved by inhaler per pt, limited med compliance. Seems to be unrelated to chest pain episodes.  - Refer to pulmonology for evaluation of chronic wheezing.  - REFERRAL TO PULMONARY (INT)  - HOLTER MONITORING; Future    5. Accelerated junctional rhythm  - REFERRAL TO CARDIOLOGY (INT)  - HOLTER MONITORING; Future    6. Palpitations  - REFERRAL TO CARDIOLOGY (INT)  - HOLTER MONITORING; Future    7. Panic attack  Occurred in office during visit, worked on deep breathing techniques, reassurance provided. Pt able to self-soothe and resolve sx without further intervention with help of daughter    8. PTSD (post-traumatic stress disorder)  Labs per ordering provider  - THYROID  SCREEN TSH REFLEX FT4  - COMPREHENSIVE METABOLIC PANEL  - CBC, PLATELET & DIFFERENTIAL  - VITAMIN B12    9. Stage 3a chronic kidney disease  Labs per ordering provider  - TOTAL IRON  BINDING CAPACITY  - URINALYSIS  - MICROALBUMIN RANDOM URINE  - PTH INTACT WITH CALCIUM   - PHOSPHORUS  - MAGNESIUM  - IRON  - FERRITIN  - ALBUMIN  - VITAMIN D ,25 HYDROXY    10. Prediabetes  Now in DM2 range given 2 A1cs 6.5% or higher, low-priority issue today, no meds needed yet. Attn diet, exercise, f/u PCP 6 mo    11. Angina pectoris (HCC)  As above, cardio f/u        I have reviewed the past medical, surgical, social and family history and updated these sections of EpicCare as relevant. All interim labs, test results, and consult  notes were reviewed and discussed with patient. Medications were reconciled during this visit and a current medication list was given to the patient at the end of the visit.      follow up 1-2 weeks with PCP, as above    Loanne Rim, PA-C    >42min was spent with patient and at least 50% of that time was spent in counseling and coordination of care.

## 2023-10-23 LAB — EKG

## 2023-10-25 NOTE — Progress Notes (Unsigned)
 CARDIOLOGY CLINIC CONSULT NOTE   Date of visit: 10/26/2023  Language: [place] phone interpreter used     History of present illness:   68 year old female with HTN, CKD 3, RAS, low burden of PAC and PVCS on holter, HL, overweight/obesity       Prior saw Dr Risser        10/26/23  Last week at pcp, seems computer read ecg as junctional rhythm but was of poor quailtiy - like nsr w/o sig changes   accompanied by her daughter   In 8/24 on vacation got HR reading as high as 150 a/w sxs of palps and chest pressure. Seems not having HR that fast since.   Bring home bp machine and bp numbers are excellent. Highest hr is 111 but most are 80-90  Having times w palps - last coupe weeks ago  No syncope  Having epigastric discomfort sxs that go into her chest, random. episodes occur randomly and are sometimes associated with chest discomfort, fatigue, and a sensation of fullness in the stomach. Stress or emotional upset can trigger these symptoms.  Two weeks ago, she had a significant episode while showering, which included weakness, chest pain, and vomiting. She monitors her heart rate using a blood pressure machine and has noted fluctuations, with recent readings being more stable, not exceeding 94 bpm this week.  She experiences persistent discomfort in her stomach, described as a feeling of fullness even without eating, and intermittent chest pain that occurs several times a day. The chest pain is not consistently related to physical activity or eating.  She has a history of fibromyalgia, contributing to significant musculoskeletal pain, particularly in her knees, legs, and hips, limiting her ability to walk outside the house. She reports severe fatigue and difficulty breathing after minimal exertion, such as walking to the reception desk.  Her current medications include citalopram, which she started approximately one month ago after discontinuing another medication that she believes exacerbated her symptoms. She also takes  omeprazole  for gastrointestinal symptoms.  No syncope, but can have presyncope. No nocturnal dyspnea, but she wakes up to use the bathroom  --stress and event monitor         Most Recent BP Reading(s)  10/26/23 : 109/67  10/20/23 : 137/81  10/07/23 : 139/75  09/02/23 : 156/79  07/29/23 : 137/70      Most Recent Weight Reading(s)  10/26/23 : 89.4 kg (197 lb)  10/20/23 : 89.8 kg (198 lb)  07/20/23 : 88.9 kg (196 lb)  06/01/23 : 91.6 kg (202 lb)  05/19/23 : 91.7 kg (202 lb 3.2 oz)       Patient Active Problem List:     Essential hypertension     Labyrinthitis     Panic disorder     Hyperlipidemia     Mild episode of recurrent major depressive disorder (HCC)     Allergic rhinitis     Chronic pain of both shoulders     Stage 3a chronic kidney disease     Anxiety state     Psychophysiological insomnia     Secondary hyperparathyroidism (HCC)     Osteopenia     Carpal tunnel syndrome of right wrist     Fibromyalgia     Hair loss     Vitamin D  deficiency     Obesity (BMI 30-39.9)     Bilateral renal artery stenosis (HCC)     DOE (dyspnea on exertion)     Recurrent epistaxis  Gastroesophageal reflux disease without esophagitis     Other specified hypothyroidism     Combined forms of age-related cataract of right eye     Cervical cancer screening     Prediabetes     Healthcare maintenance     Schistosomiasis     Intraoperative floppy iris syndrome (IFIS)     Combined forms of age-related cataract of left eye     Varicose veins of calf     Chronic bilateral low back pain without sciatica     Morbid obesity with BMI 40.0-44.9, adult (HCC)     Abnormal mammogram     Tubular adenoma of colon      Allergies:  Review of Patient's Allergies indicates:   Pollen extract          Cough    Meds:  Current Outpatient Medications   Medication Instructions    amlodipine -valsartan  (EXFORGE ) 5-320 MG per tablet 1 tablet, Oral, DAILY    atorvastatin  (LIPITOR) 80 mg, Oral, DAILY    Blood Pressure KIT 1 kit, Does not apply, DAILY BEFORE  BREAKFAST, Regular ICD i10    budesonide -formoterol  (SYMBICORT ) 160-4.5 MCG/ACT inhaler 2 puffs, Inhalation, 2 TIMES DAILY, Use 1 puff prn during the day. Rinse mouth after use.    calcium  citrate-citamin D (CALCIUM  CITRATE +) 315-5 MG-MCG tablet 1 tablet, Oral, 2 TIMES DAILY    chlorthalidone  (HYGROTEN) 25 mg, Oral, DAILY    cholecalciferol  (VITAMIN D3) 2,000 Units, Oral, 2 TIMES DAILY    escitalopram  (LEXAPRO ) 10 mg, Oral, DAILY, With breakfast    ferrous sulfate  325 mg, Oral, EVERY OTHER DAY    latanoprost  (XALATAN ) 0.005 % ophthalmic solution 1 drop, Both Eyes, NIGHTLY    levothyroxine  (SYNTHROID ) 50 mcg, Oral, EVERY MORNING BEFORE BREAKFAST    loratadine  (CLARITIN ) 10 mg, Oral, DAILY    LORazepam  (ATIVAN ) 0.5 mg, Oral, 2 TIMES DAILY    meclizine  (ANTIVERT ) 25 mg, Oral, EVERY 8 HOURS PRN    nitroGLYCERIN  (NITROSTAT ) 0.4 mg, Sublingual, EVERY 5 MIN PRN    omeprazole  (PRILOSEC) 40 mg, Oral, DAILY    OTHER MEDICATION 1 each, Does not apply, DAILY, Blood Pressure Monitoring Kit   Arm Circumference LARGE  For use as directed: "Please Dispense AMA validated cuff see [email or web address] (such as [employer])"       DX: hypertension  ICD10: I10    spironolactone  (ALDACTONE ) 50 mg, Oral, DAILY, STOP ATENOLOL , START THIS MEDICATION    torsemide  (DEMADEX ) 10 mg, Oral, DAILY       Social History:  mother of 5 and GM of 3   resides with her husband and one dau and one grandchild.   No tob, eoth, drugs    Family History:  Son - needs heart surgery, ? [facility], in [place]   Daughter - ?arrhythmia,?procedure, in [place]     ROS: All other systems reviewed were pertinently negative apart from the history of present illness.    PHYSICAL EXAM:   10/26/23  1045   BP: 109/67   Site: Left Arm   Position: Sitting   Cuff Size: Large   Pulse: 86   Temp: 97.4 F (36.3 C)   SpO2: 97%   Weight: 89.4 kg (197 lb)     0 lb (0 kg)  Body mass index is 40.8 kg/m.  General: NAD, well developed, well nourished.  HEENT: Oral mucosa is moist, no icterus, no pallor  noted.  NECK: No observable jugular venous distention, no carotid bruits, 2+ carotid upstrokes.  CHEST: Clear to auscultation,  no crackles or rhonchi. Nontender.  HEART:  No heaves or lifts. Regular rate and rhythm.  No sig murmur. No ectopy  ABDOMEN: NABS. Soft. Nontender and nondistended.   EXTREMITIES: Warm and well perfused. No edema. 2+ pulses.   NEURO: A&O X 3, moves all extremities. Grossly nonfocal.  PSYCHIATRIC: Mood and affect are appropriate. anxious      DATA:  LABS:   Lab Results   Component Value Date    NA 136 10/20/2023    K 3.4 (L) 10/20/2023    CL 93 (L) 10/20/2023    CO2 29 10/20/2023    BUN 23 (H) 10/20/2023    CREAT 1.5 (H) 10/20/2023    GLUCOSER 104 10/20/2023    LDL 153 07/14/2021    HDL 44 07/14/2021    TG 148 06/13/2020    ALT 17 10/20/2023    AST 21 10/20/2023    TSH 7.090 (H) 01/14/2021     NT-proBNP (pg/mL)   Date Value   10/20/2023 182 (H)      EKG: (on my personal review) 10/26/23 nsr, lvh in avl. Qrs 104. Minor st abnormalities lateral leads. Similar to prior.     Holter 03/17/2019  Sinus at 70 with range 52 to 100 BPM.   Approximately 600 APC's = 0.6% of all beats, including 15 couplets and 7 short relatively slow runs.   Approximately 600 VPC's = 0.6% of all beats, including 2 couplets but no runs.   No symptoms.     Holter 10/08/20  Sinus at 79 with range 54 to 121 BPM.   140 APC's including 4 coupletes and one 5-beat run at 130 BPM.    500 VPC's = 0.5% fo all beats, with no couplets or runs.   No symptoms.     Lexiscan  stress 02/26/21  Up to 1 mm of flat ST depression developed in the inferolateral leads after Lexiscan  infusion.  There was no concomitant chest pain   Noinfarct or ischemia.  Normal cardiac wall motion and ejection fraction    01/19/23  Right - The renal artery has evidence of 60-99% stenosis. RI values are elevated indicating intrinsic disease. Kidney length is normal. Renal vein is patent.  Left - The renal artery has evidence of 60-99% stenosis. RI values are elevated  indicating intrinsic disease. Kidney length is normal. Renal vein is patent.  Note: SMA has evidence of elevated velocities indicating greater than 70% stenosis.    Echo 01/19/23  1. LVEF 60% .  2.   no regional wall motion abnormalities.  3. grade II diastolic dysfunction.  4. Left Atrium: Moderately dilated.  5. Aortic Valve:  mild sclerosis; no stenosis.  mild aortic regurgitation.  6.  RVSP  30 mmHg which is normal.  7.  no pericardial effusion      Assessment and Plan:  Palpitations  Intermittent palpitations with tachycardia 150-160 bpm, chest discomfort, fatigue, and gastric discomfort. Symptoms exacerbated by stress and anxiety.  Differential includes arrhythmia or anxiety-related palpitations. Current EKG stable.  - Order stress test to evaluate for cardiac ischemia or arrhythmia.  - Order heart monitor to assess for arrhythmia.  - Encourage ambulation as tolerated.    Chest pain  Not specifically exertional but given risk factors and sxs will eval w stress  --stress test    Hypertension  Goal <130/80  Blood pressure well-controlled at 116/62 mmHg.  -cont current meds    Chronic kidney disease, renal artery stenosis   Follows w renal. Pt says not  taking spironolactone      Lipids  On atorva 80  Update lipids w next labs    Fibromyalgia  Significant musculoskeletal pain affecting mobility, particularly in knees, legs, and hips. Limits walking    Depression/anxiety  Does seems to affect sxs      Mild AR  Echo by 2027, earlier if indicated     Rtc w PA in 3-4 months and w me in 8-10 months    Diagnoses and all orders for this visit:  Essential hypertension  -     EKG  -     NM NUCLEAR PERFUSION STRESS TEST - COMPLETE; Future  -     CARDIAC EVENT MONITOR 14 DAYS; Future  Stage 3a chronic kidney disease (HCC)  -     EKG  -     NM NUCLEAR PERFUSION STRESS TEST - COMPLETE; Future  -     CARDIAC EVENT MONITOR 14 DAYS; Future  Renovascular hypertension  -     EKG  -     NM NUCLEAR PERFUSION STRESS TEST - COMPLETE;  Future  -     CARDIAC EVENT MONITOR 14 DAYS; Future  Chest pain in adult  -     NM NUCLEAR PERFUSION STRESS TEST - COMPLETE; Future  -     CARDIAC EVENT MONITOR 14 DAYS; Future  Pre-syncope  -     NM NUCLEAR PERFUSION STRESS TEST - COMPLETE; Future  -     CARDIAC EVENT MONITOR 14 DAYS; Future      Attestation      The patient verbally consented to an audio recording of their visit to assist with the completion of documentation. The patient is aware the recording is not retained after the visit is summarized.    Return for follow up in Cardiology Clinic in   months. To contact earlier if there are any cardiac related issues.     I spent a total of   minutes on this visit on the date of service (total time includes all activities performed on the date of service such as chart review, time speaking with the patient, documenting and communicating with other providers)   Thank you for the privilege of involving me in this patient's care.  Please feel free to contact me if you have any questions.  This patient encounter note was created using voice-recognition software and in real time. Please excuse any typographical errors that have not been edited out.     Electronically signed by: Darleen Ee, MD

## 2023-10-26 ENCOUNTER — Other Ambulatory Visit: Payer: Self-pay

## 2023-10-26 ENCOUNTER — Ambulatory Visit (LOCAL_COMMUNITY_HEALTH_CENTER)

## 2023-10-26 ENCOUNTER — Ambulatory Visit: Attending: Cardiovascular Disease | Admitting: Cardiovascular Disease

## 2023-10-26 VITALS — BP 116/62 | HR 86 | Temp 97.4°F | Wt 197.0 lb

## 2023-10-26 DIAGNOSIS — N1831 Chronic kidney disease, stage 3a: Secondary | ICD-10-CM | POA: Diagnosis present

## 2023-10-26 DIAGNOSIS — R079 Chest pain, unspecified: Secondary | ICD-10-CM | POA: Diagnosis present

## 2023-10-26 DIAGNOSIS — I10 Essential (primary) hypertension: Secondary | ICD-10-CM | POA: Diagnosis present

## 2023-10-26 DIAGNOSIS — I150 Renovascular hypertension: Secondary | ICD-10-CM | POA: Diagnosis present

## 2023-10-26 DIAGNOSIS — R55 Syncope and collapse: Secondary | ICD-10-CM | POA: Diagnosis present

## 2023-10-28 ENCOUNTER — Ambulatory Visit

## 2023-10-28 ENCOUNTER — Other Ambulatory Visit: Payer: Self-pay

## 2023-10-28 ENCOUNTER — Ambulatory Visit (HOSPITAL_BASED_OUTPATIENT_CLINIC_OR_DEPARTMENT_OTHER)

## 2023-10-28 DIAGNOSIS — K08109 Complete loss of teeth, unspecified cause, unspecified class: Secondary | ICD-10-CM

## 2023-10-28 NOTE — Progress Notes (Signed)
 Patient presented for removable denture appointment. Denture process has been explained to the patient in detail. Expectations for dentures have been established, and limitations explained.    Patient pain level at beginning of appointment: 0    Interpreter: [place]    *Medical/Dental History*  Medical history reviewed and updated.    *Denture Treatment*  Prosthesis type: MX/MD CD  Denture step(s) performed: MMR  Impression material used: Blue bite  Bite registration taken: Yes  Tooth shade selection: A2  Acrylic base shade selection: Standard Pink  Checked and adjusted as needed: High spots, Vertical dimension, Occlusion, Phonetics, Esthetics and Retention and stability   Lab: None    *Assessment*  Patient would like to continue with: One-on-one appointments  Consent signed by patient for final processing: No   Assessment/Plan: Midline marked. Patient to return for tooth try in with husband.     Patient pain level at end of appointment: 0    Patient satisfied with prosthesis and will continue treatment until complete. Case sent to lab with detailed prescription. Patient left ambulatory, satisfied with treatment. Any remaining planned treatments were explained in detail to the patient.    Assistant: Laverta Potters  Resident: Dr. Andy Bannister     Next visit: What: Tooth try in  Reason: Edentulous

## 2023-10-30 LAB — EKG

## 2023-10-31 ENCOUNTER — Ambulatory Visit (HOSPITAL_BASED_OUTPATIENT_CLINIC_OR_DEPARTMENT_OTHER): Payer: Self-pay | Admitting: Physician Assistant

## 2023-11-01 ENCOUNTER — Ambulatory Visit (HOSPITAL_BASED_OUTPATIENT_CLINIC_OR_DEPARTMENT_OTHER): Payer: Self-pay

## 2023-11-01 DIAGNOSIS — N1831 Chronic kidney disease, stage 3a: Secondary | ICD-10-CM

## 2023-11-01 MED ORDER — FERROUS SULFATE 325 (65 FE) MG PO TABS
325.0000 mg | ORAL_TABLET | ORAL | 11 refills | Status: AC
Start: 2023-11-01 — End: 2024-10-31

## 2023-11-02 NOTE — Telephone Encounter (Signed)
 Called  message  reviewed.  Name and  DOB  verified.  Marlinda Mike  RN

## 2023-11-02 NOTE — Telephone Encounter (Signed)
-----   Message from Birdia Buhl [place] sent at 11/01/2023  4:45 PM EDT -----  Hi Enez Monahan, I am so sorry, it should say every other day. Sincerely, Mylinda Asa  ----- Message -----  From: Jorie Newness, RN  Sent: 11/01/2023   3:53 PM EDT  To: Betsey Brow, MD    Hi  Doctor  Adele Admire  is  Iron  supplement  every day or  every  other  day ?  Thanks  Veronia Goon  RN  ----- Message -----  From: Betsey Brow, MD  Sent: 11/01/2023   3:33 PM EDT  To: Ems Nurses Pool    Dear RN,     Please:    1. Create Telephone encounter for this patient.   2. Share with the patient the attached results.   Please let the patient know his lab results are stable overall. Iron levels are low. Please provide the creatinine (measure of kidney function) as well as any other requested results     Plan:   1. Due to low iron levels I recommend resuming iron supplement, ferrous sulfate  325 mg daily. I will send a prescription to her pharmacy.    2. Type of Outreach: 1 phone call and if unable to reach send letter     3. Document the conversation in the Telephone Encounter and close the encounter, no need to send back to me.     Thank you,   Nanda Baar    ----- Message -----  From: Interface, Lab  Sent: 10/20/2023   2:17 PM EDT  To: Betsey Brow, MD

## 2023-11-03 ENCOUNTER — Other Ambulatory Visit: Payer: Self-pay

## 2023-11-03 ENCOUNTER — Ambulatory Visit
Admission: RE | Admit: 2023-11-03 | Discharge: 2023-11-03 | Disposition: A | Discharge status: Home / Self Care | Attending: Cardiovascular Disease | Admitting: Cardiovascular Disease

## 2023-11-03 ENCOUNTER — Ambulatory Visit

## 2023-11-03 DIAGNOSIS — R55 Syncope and collapse: Secondary | ICD-10-CM | POA: Insufficient documentation

## 2023-11-03 DIAGNOSIS — I10 Essential (primary) hypertension: Secondary | ICD-10-CM | POA: Diagnosis not present

## 2023-11-03 DIAGNOSIS — R079 Chest pain, unspecified: Secondary | ICD-10-CM | POA: Diagnosis not present

## 2023-11-03 DIAGNOSIS — N1831 Chronic kidney disease, stage 3a: Secondary | ICD-10-CM | POA: Diagnosis not present

## 2023-11-03 DIAGNOSIS — I150 Renovascular hypertension: Secondary | ICD-10-CM | POA: Diagnosis not present

## 2023-11-07 ENCOUNTER — Other Ambulatory Visit (HOSPITAL_BASED_OUTPATIENT_CLINIC_OR_DEPARTMENT_OTHER): Payer: Self-pay | Admitting: Internal Medicine

## 2023-11-07 MED ORDER — METOPROLOL SUCCINATE ER 25 MG PO TB24
25.0000 mg | ORAL_TABLET | Freq: Every day | ORAL | 3 refills | Status: DC
Start: 2023-11-07 — End: 2023-12-10

## 2023-11-07 MED ORDER — APIXABAN 5 MG PO TABS
5.0000 mg | ORAL_TABLET | Freq: Two times a day (BID) | ORAL | 11 refills | Status: DC
Start: 2023-11-07 — End: 2023-12-31

## 2023-11-07 NOTE — Progress Notes (Signed)
 Paged by [employer] re: Afib episode with RVR (auto-triggered) on 11/06/2023, 9:12: 39 pm, duration at least 90  seconds.    Called patient with [place] interpreter [phone], patient did not pick up the phone, I left VM on 2 different numbers.    Will attempt to call back.  Eliquis 5 mg twice daily was sent to the pharmacy.  Will notify PCP and ordering physician, Dr. Tad Eye.  Start Toprol-XL 25 mg daily

## 2023-11-09 ENCOUNTER — Telehealth (HOSPITAL_BASED_OUTPATIENT_CLINIC_OR_DEPARTMENT_OTHER): Payer: Self-pay

## 2023-11-09 NOTE — Telephone Encounter (Signed)
 Pt daughter called today stating her mom received a call from cardiology. She didn't know what that call was about?    Advice pt Dr. Irene Mannheim was trying to reach pt about her meds Eliquis. Pt daughter would like to speak with the nurse about her mom's diagnosis.     Please see 5/18 encounter by Dr. Irene Mannheim.    Call Barbra Ley - [phone].

## 2023-11-09 NOTE — Telephone Encounter (Signed)
 Called  returned to patients daughter Barbra Ley [facility]) with telephone interpreter Pa [address]    Discussed message Dr. Irene Mannheim called pt to relay on Sunday 5/18.    Explained that pt had episode of Afib on Saturday 5/17 at 9:12pm., lasting 90 seconds.    Advised to have patient start Eliquis 5 mg twice daily, as well as start Toprol XL 25 mg daily.      Msg fwd to Dr. Vaughn Georges to make aware.          Progress Notes  Budiu, Vona Grumet, MD (Physician)  Cardiology                   Paged by [employer] re: Afib episode with RVR (auto-triggered) on 11/06/2023, 9:12: 39 pm, duration at least 90  seconds.     Called patient with [place] interpreter [phone], patient did not pick up the phone, I left VM on 2 different numbers.     Will attempt to call back.  Eliquis 5 mg twice daily was sent to the pharmacy.  Will notify PCP and ordering physician, Dr. Tad Eye.  Start Toprol-XL 25 mg daily

## 2023-11-11 ENCOUNTER — Encounter (HOSPITAL_BASED_OUTPATIENT_CLINIC_OR_DEPARTMENT_OTHER): Payer: Self-pay

## 2023-11-11 ENCOUNTER — Encounter (LOCAL_COMMUNITY_HEALTH_CENTER): Payer: Self-pay

## 2023-11-11 DIAGNOSIS — N1831 Chronic kidney disease, stage 3a: Secondary | ICD-10-CM

## 2023-11-11 DIAGNOSIS — K219 Gastro-esophageal reflux disease without esophagitis: Secondary | ICD-10-CM

## 2023-11-11 DIAGNOSIS — F410 Panic disorder [episodic paroxysmal anxiety] without agoraphobia: Secondary | ICD-10-CM

## 2023-11-11 NOTE — Telephone Encounter (Signed)
 PER Patient (self), Sara Snyder is a 68 year old female has requested a refill of Escitalopram  10 mg and Lorazepam  0.5 mg tablets.    Last Office Visit: 10/12/2023 S. Haight-Carter, APRN    Other Med Adult:  Most Recent BP Reading(s)  10/28/23 : 153/75        Cholesterol (mg/dL)   Date Value   91/47/8295 215     LOW DENSITY LIPOPROTEIN DIRECT (mg/dL)   Date Value   62/13/0865 153     HIGH DENSITY LIPOPROTEIN (mg/dL)   Date Value   78/46/9629 44     TRIGLYCERIDES (mg/dL)   Date Value   52/84/1324 148         THYROID  SCREEN TSH REFLEX FT4 (uIU/mL)   Date Value   10/20/2023 3.430         TSH (THYROID  STIM HORMONE) (uIU/mL)   Date Value   01/14/2021 7.090 (H)       HEMOGLOBIN A1C (%)   Date Value   12/03/2022 6.9 (H)       POC HEMOGLOBIN A1C (%)   Date Value   10/20/2023 6.5 (H)         No results found for: "INR"    SODIUM (mmol/L)   Date Value   10/20/2023 136       POTASSIUM (mmol/L)   Date Value   10/20/2023 3.4 (L)           CREATININE (mg/dL)   Date Value   40/03/2724 1.5 (H)       Documented patient preferred pharmacies:    [facility] [place] [place] - [address]  Phone: [phone] Fax: [phone]

## 2023-11-12 ENCOUNTER — Encounter (HOSPITAL_BASED_OUTPATIENT_CLINIC_OR_DEPARTMENT_OTHER): Payer: Self-pay

## 2023-11-12 ENCOUNTER — Ambulatory Visit (HOSPITAL_BASED_OUTPATIENT_CLINIC_OR_DEPARTMENT_OTHER): Payer: Self-pay

## 2023-11-12 DIAGNOSIS — E038 Other specified hypothyroidism: Secondary | ICD-10-CM

## 2023-11-12 MED ORDER — LORAZEPAM 0.5 MG PO TABS
0.5000 mg | ORAL_TABLET | Freq: Two times a day (BID) | ORAL | 0 refills | Status: AC
Start: 2023-11-12 — End: 2023-12-12

## 2023-11-12 MED ORDER — ESCITALOPRAM OXALATE 10 MG PO TABS
10.0000 mg | ORAL_TABLET | Freq: Every day | ORAL | 2 refills | Status: DC
Start: 2023-11-12 — End: 2024-01-11

## 2023-11-12 MED ORDER — LEVOTHYROXINE SODIUM 50 MCG PO TABS
50.0000 ug | ORAL_TABLET | Freq: Every morning | ORAL | 3 refills | Status: AC
Start: 2023-11-12 — End: 2024-11-06

## 2023-11-12 NOTE — Telephone Encounter (Signed)
 Adult General Triage    I spoke with Child:     Chief Complaint: Possible med side effect     Current Day of Symptoms: 2     Patient Active Problem List:     Essential hypertension     Labyrinthitis     Panic disorder     Hyperlipidemia     Mild episode of recurrent major depressive disorder (HCC)     Allergic rhinitis     Chronic pain of both shoulders     Stage 3a chronic kidney disease     Anxiety state     Psychophysiological insomnia     Secondary hyperparathyroidism (HCC)     Osteopenia     Carpal tunnel syndrome of right wrist     Fibromyalgia     Hair loss     Vitamin D  deficiency     Obesity (BMI 30-39.9)     Bilateral renal artery stenosis (HCC)     DOE (dyspnea on exertion)     Recurrent epistaxis     Gastroesophageal reflux disease without esophagitis     Other specified hypothyroidism     Combined forms of age-related cataract of right eye     Cervical cancer screening     Prediabetes     Healthcare maintenance     Schistosomiasis     Intraoperative floppy iris syndrome (IFIS)     Combined forms of age-related cataract of left eye     Varicose veins of calf     Chronic bilateral low back pain without sciatica     Morbid obesity with BMI 40.0-44.9, adult (HCC)     Abnormal mammogram     Tubular adenoma of colon      Review of Patient's Allergies indicates:   Pollen extract          Cough    Emergency care:     Loss of Consciousness:  No     Chest Pain:  No     Severe Difficulty Breathing:  No     Concern for life threatening condition:  No     The patient has the following symptoms:     Pts daughter calling on behalf of her mom- recently started on eliquis  and metoprolol - states that since starting these meds her mom has had intermittent diarrhea, some nausea and intermittent mild to moderate abdominal discomfort. No other more acute sick symptoms like fever or vomiting. They aren't sure which med is triggering it as symptoms are throughout the day. She has not tried anything over the counter for her  symptoms and Is wondering if pepto bismal would be ok?     Pregnancy:  N/A     Home Remedies:  none     Advised per nursing triage protocol.         Recommended disposition for patient:  Disposition: Scheduled Televisit Appointment- discussed that I will send her pcp a message re this to see if we can get her an answer tonight but that as these are important meds I can't recommend she stop them. Pt advised to call the on call tonight or tomorrow if side effects worsen.    If patient referred to UC/ED advised that they may require further follow up and testing after the visit with their primary care office.     Instructed patient to call back for any new, worsening, or worrisome symptoms or concerns any time day or night.    Advised per nursing triage protocol.  Telephone Call Outcome:  Single Call Resolution        Reason for Disposition   Caller has NON-URGENT medicine question about med that PCP or specialist prescribed and triager unable to answer question    Protocols used: Medication Question Call-A-[place]

## 2023-11-12 NOTE — Telephone Encounter (Signed)
 This prescription refill request has passed the Rx Renewal Authorization protocol.  This medication has been approved and sent to the patient's preferred pharmacy.      PER Patient (self), Sara Snyder is a 68 year old female has requested a refill of levothyroxine .      Last Office Visit: 04/29/2023 with Jackey Mary, Diego Foy, PA-C   Last Physical Exam: 06/13/2020    There are no preventive care reminders to display for this patient.    Thyroid  Med:  TSH:  TSH (THYROID  STIM HORMONE) (uIU/mL)   Date Value   01/14/2021 7.090 (H)    TSH Screen:  THYROID  SCREEN TSH REFLEX FT4 (uIU/mL)   Date Value   10/20/2023 3.430       Documented patient preferred pharmacies:    [facility] [place] [place] - [address]  Phone: [phone] Fax: [phone]

## 2023-11-15 DIAGNOSIS — I209 Angina pectoris, unspecified: Secondary | ICD-10-CM | POA: Insufficient documentation

## 2023-11-16 ENCOUNTER — Encounter (HOSPITAL_BASED_OUTPATIENT_CLINIC_OR_DEPARTMENT_OTHER): Payer: Self-pay

## 2023-11-16 ENCOUNTER — Ambulatory Visit

## 2023-11-16 ENCOUNTER — Other Ambulatory Visit: Payer: Self-pay

## 2023-11-16 ENCOUNTER — Telehealth (HOSPITAL_BASED_OUTPATIENT_CLINIC_OR_DEPARTMENT_OTHER): Payer: Self-pay

## 2023-11-16 VITALS — BP 145/78 | HR 88

## 2023-11-16 DIAGNOSIS — N393 Stress incontinence (female) (male): Secondary | ICD-10-CM | POA: Diagnosis not present

## 2023-11-16 DIAGNOSIS — K08109 Complete loss of teeth, unspecified cause, unspecified class: Secondary | ICD-10-CM

## 2023-11-16 DIAGNOSIS — I4891 Unspecified atrial fibrillation: Secondary | ICD-10-CM | POA: Diagnosis present

## 2023-11-16 DIAGNOSIS — T50905A Adverse effect of unspecified drugs, medicaments and biological substances, initial encounter: Secondary | ICD-10-CM | POA: Insufficient documentation

## 2023-11-16 DIAGNOSIS — K2970 Gastritis, unspecified, without bleeding: Secondary | ICD-10-CM | POA: Diagnosis present

## 2023-11-16 MED ORDER — FAMOTIDINE 40 MG PO TABS
40.0000 mg | ORAL_TABLET | Freq: Every evening | ORAL | 0 refills | Status: DC
Start: 2023-11-16 — End: 2024-02-22

## 2023-11-16 NOTE — Progress Notes (Unsigned)
 HPI  Sara Snyder is a 68 year old female for televisit      Interview was in [place] with telephone interpreter  Daughter Sara Snyder is on the call as well  Video was very challenging but we were able to get it work  Equities trader on line and Sara Snyder helps with follow up    Some specific concerns today       Social History    Social History Narrative      Originally from [place], [place]      History of childhood trauma      Lives with family      The combined HPI and assessment and plan are below; with any medical information and history updated and reviewed.     Physical Exam  Available vitals per patient: n/a    General: comfortable young  woman,   No conversational dyspnea  Alert and oriented to person, place, and time  Appropriate mood, affect, tone and conversation     Assessment and Plan    Assessment & Plan        Problem List           Diagnosed       High    Atrial fibrillation with RVR (HCC)     Overview   Noted on cardiac monitoring -   Start Toprol -XL 25 mg daily  Eliquis  5 mg twice daily    Reviewed the results of her monitor and what afib is/ why important to be on both of these medications     Had some diarrhea and nausea / abdominal pain over the weekend and she attributes to the medicatgion  It may be from medication, it may be unrelated gastritis, it may be viral syndrome    Reviewed side effects will remit over a few days/weeks and generally these are safe medications  Restart Metoprolol  12.5 mg daily for 1-2 days, then go to full dose  Continue Apixiban  Add Famotidine  40 mg QHS standing for gastritis symptoms before bed, well apart from other emds)               Medium    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 incontinence     Overview   Long standing  Conflict between treating with medication and then consider Urogyencology   Can trial Oxybunin or similar             Unprioritized    Gastritis without bleeding  As above     Relevant Medications    famotidine  (PEPCID ) 40 MG tablet         F/u with me in person in 1 month      Cardiology appointment and staff outreached as well, FYI to Dr. Roma Snyder R. Marie Shone, MD  11/16/23 1:43 PM

## 2023-11-16 NOTE — Telephone Encounter (Signed)
 left voicemail message for patient. Per Dr. Vaughn Georges, offered pt an apptm on 6/20 at 11:30 pm. Asked patient to call back to confirm if date/time works for the patient.

## 2023-11-16 NOTE — Progress Notes (Signed)
 Dental procedures in this visit    [id number] - WAX TRY-IN 2,9,5,6,2,1,3,0,86,57,84,69,62,95 (Completed)     Service provider: Lynelle Sara, DMD     Billing provider: Lynelle Sara, DMD    [id number] Leanna Promise TRY-IN 18,19,20,21,22,23,24,25,26,27,28,29,30,31 (Completed)     Service provider: Lynelle Sara, DMD     Billing provider: Lynelle Sara, DMD      Patient presented for removable denture appointment. Denture process has been explained to the patient in detail. Expectations for dentures have been established, and limitations explained.    Patient pain level at beginning of appointment:     Interpreter: None    *Medical/Dental History*  Medical history reviewed and updated.    *Denture Treatment*  Prosthesis type: Max & Mand CD Conventional  Denture step(s) performed: Try In  Impression material used: None  Bite registration taken: None   Tooth shade selection: Verified  Acrylic base shade selection: Standard Pink  Checked and adjusted as needed: High spots, Vertical dimension, Occlusion, Phonetics, Esthetics and Retention and stability   Lab: [facility]    *Assessment*  Patient would like to continue with: One-on-one appointments  Consent signed by patient for final processing: No   Assessment/Plan: Finish as planned    Patient pain level at end of appointment:     Patient satisfied with prosthesis and will continue treatment until complete. Case sent to lab with detailed prescription. Patient left ambulatory, satisfied with treatment. Any remaining planned treatments were explained in detail to the patient.    NV: CD Delivery

## 2023-11-16 NOTE — Telephone Encounter (Signed)
 I spoke to patient and daughter on televisit today  They confirmed they can attend 6/20 appointment with Dr. Vaughn Georges at 11:30a    Please send to [facility] [phone] (updated contact)    Dr Vaughn Georges- having some gastritis to BB and DOAC  Encouraged to change timings of some other meds  And continue these      Joury Allcorn R. Marie Shone, MD  11/16/23 2:06 PM

## 2023-11-16 NOTE — Telephone Encounter (Signed)
 Send Friday of long weekend, reviewed Tuesday AM    Pt scheduled with me tomorrow, appropriate time frame  Neo Yepiz R. Marie Shone, MD  11/16/23 8:13 AM

## 2023-11-17 ENCOUNTER — Encounter (HOSPITAL_BASED_OUTPATIENT_CLINIC_OR_DEPARTMENT_OTHER): Payer: Self-pay

## 2023-11-17 DIAGNOSIS — K219 Gastro-esophageal reflux disease without esophagitis: Secondary | ICD-10-CM

## 2023-11-17 DIAGNOSIS — N1831 Chronic kidney disease, stage 3a: Secondary | ICD-10-CM

## 2023-11-18 ENCOUNTER — Ambulatory Visit

## 2023-11-23 ENCOUNTER — Ambulatory Visit
Admission: RE | Admit: 2023-11-23 | Discharge: 2023-11-23 | Disposition: A | Discharge status: Home / Self Care | Attending: Cardiovascular Disease | Admitting: Cardiovascular Disease

## 2023-11-23 ENCOUNTER — Other Ambulatory Visit: Payer: Self-pay

## 2023-11-23 ENCOUNTER — Ambulatory Visit (HOSPITAL_BASED_OUTPATIENT_CLINIC_OR_DEPARTMENT_OTHER)
Admission: RE | Admit: 2023-11-23 | Discharge: 2023-11-23 | Disposition: A | Discharge status: Home / Self Care | Source: Ambulatory Visit

## 2023-11-23 ENCOUNTER — Other Ambulatory Visit (HOSPITAL_BASED_OUTPATIENT_CLINIC_OR_DEPARTMENT_OTHER): Payer: Self-pay | Admitting: Cardiovascular Disease

## 2023-11-23 DIAGNOSIS — R55 Syncope and collapse: Secondary | ICD-10-CM | POA: Diagnosis not present

## 2023-11-23 DIAGNOSIS — R079 Chest pain, unspecified: Secondary | ICD-10-CM | POA: Insufficient documentation

## 2023-11-23 DIAGNOSIS — N1831 Chronic kidney disease, stage 3a: Secondary | ICD-10-CM

## 2023-11-23 DIAGNOSIS — I10 Essential (primary) hypertension: Secondary | ICD-10-CM

## 2023-11-23 DIAGNOSIS — I150 Renovascular hypertension: Secondary | ICD-10-CM | POA: Insufficient documentation

## 2023-11-23 MED ORDER — REGADENOSON 0.4 MG/5ML IV SOLN
0.4000 mg | Freq: Once | INTRAVENOUS | Status: AC
Start: 2023-11-23 — End: 2023-11-23
  Administered 2023-11-23: 0.4 mg via INTRAVENOUS
  Filled 2023-11-23: qty 5

## 2023-11-23 MED ORDER — TECHNETIUM TC-99M SESTAMIBI (CARDIOLITE) INJECTION
9.0000 | Freq: Once | INTRAVENOUS | Status: AC
Start: 2023-11-23 — End: 2023-11-23
  Administered 2023-11-23: 31.5 via INTRAVENOUS

## 2023-11-23 MED ORDER — AMINOPHYLLINE 25 MG/ML IV SOLN
50.0000 mg | INTRAVENOUS | Status: DC | PRN
Start: 2023-11-23 — End: 2023-12-23
  Administered 2023-11-23: 50 mg via INTRAVENOUS
  Filled 2023-11-23: qty 20

## 2023-11-23 NOTE — Progress Notes (Signed)
 Pt. Arrived to the stress lab.   Test explained and consent obtained.   Brief history taken with no active chest pain.  No history of asthma and lung sounds clear upon assessment.   Vitals WNL for patient HR 70bpm BP 132/60 spO2 97 %.   IV started and intravenous Regadenoson  (lexiscan ) was injected, 400mcg over 10 seconds.   No chest pain throughout test.   Patient reported dyspnea, fatigue and stomach upset with Lexiscan  administration.    Lexiscan  was reversed with 50 mg of Aminophylline .   Pt. Was observed for 20 minutes and left the stress lab in stable condition.   Please see Stress Report in Cardiopulmonary tab.    Trey Fuel, RN, 11/23/2023

## 2023-11-24 ENCOUNTER — Ambulatory Visit
Admission: RE | Admit: 2023-11-24 | Discharge: 2023-11-24 | Disposition: A | Discharge status: Home / Self Care | Attending: Student in an Organized Health Care Education/Training Program | Admitting: Student in an Organized Health Care Education/Training Program

## 2023-11-24 ENCOUNTER — Ambulatory Visit (HOSPITAL_BASED_OUTPATIENT_CLINIC_OR_DEPARTMENT_OTHER)

## 2023-11-24 DIAGNOSIS — R0789 Other chest pain: Secondary | ICD-10-CM | POA: Insufficient documentation

## 2023-11-24 MED ORDER — TECHNETIUM TC-99M SESTAMIBI (CARDIOLITE) INJECTION
9.0000 | Freq: Once | INTRAVENOUS | Status: AC
Start: 2023-11-24 — End: 2023-11-24
  Administered 2023-11-24: 31.3 via INTRAVENOUS

## 2023-11-25 ENCOUNTER — Ambulatory Visit (HOSPITAL_BASED_OUTPATIENT_CLINIC_OR_DEPARTMENT_OTHER)

## 2023-11-26 ENCOUNTER — Ambulatory Visit (HOSPITAL_BASED_OUTPATIENT_CLINIC_OR_DEPARTMENT_OTHER): Payer: Self-pay | Admitting: Cardiovascular Disease

## 2023-11-26 NOTE — Telephone Encounter (Addendum)
 Spoke w daughter  Pictures from stress test were ok but the ecg was not normal. We often go more by the pictures.  ordered stress test for sxs that may be associated with the new dx of afib.    Pt now on metoprolol  and eliquis  since prior visit   Confirmed pt is feeling better on metoprolol    Will discuss in more detail at appt 12/10/23  Darleen Ee, MD, 11/26/2023, 8:23 AM        Zio 5/25  12 days 20 hours of monitoring after artifact removed  - Predominant sinus rhythm.  Heart rate in sinus 55-122, average 72 bpm  - There were 320 runs of SVT.  The fastest SVT was 6 beats with a maximum rate of 194.  The longest SVT was 8.1 seconds with an average rate of 115 bpm.  Some SVT is atrial tachycardia with variable block  - Atrial fibrillation occurred 2% of the time with heart rates in atrial fibrillation 71-188, average 104.  The longest episode of A-fib was 23 minutes 11 seconds with an average heart rate of 120 bpm.  There were 212 runs of afib and 8 episodes that were 6 minutes or longer.   - Isolated PACs comprise 1.8% of beats.  PAC couplets and triplets were rare  - Isolated PVCs were infrequent.  PVC couplets were rare.  No ventricular runs  - There were no patient events

## 2023-11-29 ENCOUNTER — Ambulatory Visit

## 2023-11-29 ENCOUNTER — Ambulatory Visit: Attending: Pulmonary Disease | Admitting: Pulmonary Disease

## 2023-11-29 ENCOUNTER — Encounter (HOSPITAL_BASED_OUTPATIENT_CLINIC_OR_DEPARTMENT_OTHER): Payer: Self-pay | Admitting: Pulmonary Disease

## 2023-11-29 ENCOUNTER — Other Ambulatory Visit: Payer: Self-pay

## 2023-11-29 VITALS — BP 122/68 | HR 80 | Temp 97.5°F | Ht 59.0 in | Wt 199.0 lb

## 2023-11-29 DIAGNOSIS — Z9109 Other allergy status, other than to drugs and biological substances: Secondary | ICD-10-CM | POA: Insufficient documentation

## 2023-11-29 DIAGNOSIS — R0609 Other forms of dyspnea: Secondary | ICD-10-CM | POA: Diagnosis not present

## 2023-11-29 DIAGNOSIS — E66813 Obesity, class 3: Secondary | ICD-10-CM | POA: Insufficient documentation

## 2023-11-29 DIAGNOSIS — J4540 Moderate persistent asthma, uncomplicated: Secondary | ICD-10-CM | POA: Diagnosis not present

## 2023-11-29 DIAGNOSIS — Z6841 Body Mass Index (BMI) 40.0 and over, adult: Secondary | ICD-10-CM | POA: Insufficient documentation

## 2023-11-29 MED ORDER — BUDESONIDE-FORMOTEROL FUMARATE 160-4.5 MCG/ACT IN AERO
2.0000 | INHALATION_SPRAY | Freq: Two times a day (BID) | RESPIRATORY_TRACT | 5 refills | Status: DC
Start: 2023-11-29 — End: 2024-05-09

## 2023-11-29 MED ORDER — CETIRIZINE HCL 10 MG PO TABS: 10.0000 mg | ORAL_TABLET | Freq: Every day | ORAL | 5 refills | Status: DC

## 2023-11-29 NOTE — Progress Notes (Unsigned)
 PULMONARY CLINIC VISIT NOTE        CC: Wheezing    HPI:     Sara Snyder is a pleasant 68 yo with hx of allergic rhinitis, ?asthma, CKD, HTN who was referred for wheezing.     [place] telephone interpreter was used during the visit. Patient's daughter was also present and helped with giving more history.    - symptoms have been ongoing for 1 year but worsened over the past month   - describes wheezing and a dry cough   - no hx of childhood asthma   - she's tried symbicort  160/4.14mcg 2 puffs bid inconsistently for some time and then stopped it because she didn't feel like it helped   - her daughter reports that cough got worse during pollen season, also reports dyspnea with showering and walking and needs help with ADLs  - patient became very tearful during the interview because this has been ongoing for a while and she feels that the dyspnea is interfering with her qualify of life   - no pets or carpets at home  - no smoking  - no hx of eczema   - does have environmental allergies       PMH:  Patient Active Problem List:     Essential hypertension     Labyrinthitis     Panic disorder     Hyperlipidemia     Mild episode of recurrent major depressive disorder (HCC)     Allergic rhinitis     Chronic pain of both shoulders     Stage 3a chronic kidney disease     Anxiety state     Psychophysiological insomnia     Secondary hyperparathyroidism (HCC)     Osteopenia     Carpal tunnel syndrome of right wrist     Fibromyalgia     Hair loss     Vitamin D  deficiency     Obesity (BMI 30-39.9)     Bilateral renal artery stenosis (HCC)     DOE (dyspnea on exertion)     Recurrent epistaxis     Gastroesophageal reflux disease without esophagitis     Other specified hypothyroidism     Combined forms of age-related cataract of right eye     Cervical cancer screening     Prediabetes     Healthcare maintenance     Schistosomiasis     Intraoperative floppy iris syndrome (IFIS)     Combined forms of  age-related cataract of left eye     Varicose veins of calf     Chronic bilateral low back pain without sciatica     Morbid obesity with BMI 40.0-44.9, adult (HCC)     Abnormal mammogram     Tubular adenoma of colon     Angina pectoris [facility])     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 incontinence     Gastritis without bleeding     Atrial fibrillation with RVR (HCC)      SH:  Social History     Socioeconomic History    Marital status: Married     Spouse name: Not on file    Number of children: Not on file    Years of education: Not on file    Highest education level: Not on file   Occupational History    Not on file   Tobacco Use    Smoking status: Former     Current packs/day: 0.00     Average packs/day: 2.0 packs/day for 0.3 years (0.7 ttl  pk-yrs)     Types: Cigarettes     Start date: 59     Quit date: 10/27/1977     Years since quitting: 46.1    Smokeless tobacco: Never   Substance and Sexual Activity    Alcohol use: Not Currently    Drug use: Never    Sexual activity: Yes     Partners: Male   Other Topics Concern    Not on file   Social History Narrative    Originally from [place], [place]    History of childhood trauma    Lives with family   Social Drivers of Catering manager Strain: Not on file  Food Insecurity: Not on file  Transportation Needs: Not on file  Physical Activity: Not on file  Stress: Not on file  Social Connections: Not on file  Intimate Partner Violence: Not on file  Housing Stability: Not on file      Allergies:     Review of Patient's Allergies indicates:   Pollen extract          Cough     Medications:     Medication List      as of Oct 29, 2021 10:43 AM     You have not been prescribed any medications.         Vital Signs :     BP: (122)/(68)   Temp:  [97.5 F (36.4 C)]   Pulse:  [80]   Resp: --  SpO2:  [96 %]     Physical Exam:   General appearance: alert, appears stated age, and cooperative  Lungs: clear to auscultation bilaterally  Heart:  regular rate and rhythm, S1, S2 normal, no murmur, click, rub or  gallop  Extremities: extremities normal, atraumatic, no cyanosis or edema  Pulses: 2+ and symmetric  Skin: Skin color, texture, turgor normal. No rashes or lesions  Neurologic: Grossly normal     DATA:     Recent labs:  Lab Results   Component Value Date    NA 136 10/20/2023    K 3.4 (L) 10/20/2023    CL 93 (L) 10/20/2023    CO2 29 10/20/2023    BUN 23 (H) 10/20/2023    CREAT 1.5 (H) 10/20/2023    GLUCOSER 104 10/20/2023     Lab Results   Component Value Date    WBC 7.5 10/20/2023    HGB 11.0 (L) 10/20/2023    HCT 33.3 (L) 10/20/2023    PLTA 317 10/20/2023     PFT 02/2018:   Flow volume loops are normal, and unchanged with bronchodilator.  FEV1 is 2.02 L (98% of predicted), FVC is 2.53 L (97% predicted), and FEV1 to FVC ratio is normal at 80%. Lung volumes are all within normal limits, with the exception of a reduced expiratory reserve volume.  Diffusion capacity is calculated as being within normal limits (55% of predicted, and when adjusted for alveolar volume, 114% predicted). O2 saturation ranges from 96 to 98% with exertion.  Impression: No obstructive or restrictive dysfunction.  Diffusion capacity and exercise oximetry are within normal limits.  Recommendation: If asthma is a concern, patient may be referred for methacholine challenge testing.    ASSESSMENT/PLAN:    # Wheezing  # Dyspnea on exertion   # Suspect asthma  # Environmental allergies    # Morbid obesity     Symptoms are severe and affecting qualify of life. Based on her symptoms of wheezing, dyspnea, association of environmental allergies, I  think asthma remains the most likely diagnosis. I reviewed her PFTs from 2019 and they were normal, however that does not rule out asthma and a methacholine challenge is needed for a definitive diagnosis. Given the chronicity of her symptoms, it is also worth obtaining a chest CT to r/o other etiologies such as ILD, bronchiectasis, etc. She also has multiple cardiac risk factors so an echocardiogram is warrant  to evaluate both left and right-sided heart disease.     Plan:   - start symbicort  160/4.51mcg 2 puffs bid, advised patient to be consistent with it  - start zyrtec  10mg  daily given allergy symptoms  - methacholine challenge  - chest CT  - echocardiogram  - follow up in 2 months after testing is complete     Berneda Bridges, MD  Division of Pulmonary and Critical Care Medicine  Pager: [phone]

## 2023-12-01 ENCOUNTER — Other Ambulatory Visit: Payer: Self-pay

## 2023-12-01 ENCOUNTER — Ambulatory Visit: Attending: Ophthalmology | Admitting: Ophthalmology

## 2023-12-01 ENCOUNTER — Encounter (HOSPITAL_BASED_OUTPATIENT_CLINIC_OR_DEPARTMENT_OTHER): Payer: Self-pay | Admitting: Ophthalmology

## 2023-12-01 DIAGNOSIS — H409 Unspecified glaucoma: Secondary | ICD-10-CM | POA: Diagnosis present

## 2023-12-01 MED ORDER — TRAVOPROST (BAK FREE) 0.004 % OP SOLN
1.0000 [drp] | Freq: Every evening | OPHTHALMIC | 11 refills | Status: DC
Start: 2023-12-01 — End: 2024-01-17

## 2023-12-01 NOTE — Progress Notes (Signed)
 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    Disc OCT done today.    States she used latanoprost  only for 2 weeks and stopped due to burning. No change in vision.

## 2023-12-01 NOTE — Progress Notes (Signed)
 F/u glaucoma to check IOP    She stopped taking Latanoprost  because she said it burned     Impression,     1. Pseudophakia OU-doing well  OD 02/03/22  OS 03/25/22     2. Glaucoma high suspicion    She did not want to take Latanoprost  since she said it made her eyes burn all day.     IOP  today was 17 OD and 17 OS by applanation (No glaucoma medications)     Disc OCT 04/16/23  had good signal strength and revealed an average retinal NFL thickness of 80 microns OD with a C/D of 0.72 and 64 microns OS with a C/D of 0.76                HVF revealed infero nasal defect OD and temporal defect OS     Plan-Try Travatan Z 1gtt OU QHS    RTC 2-3 months to check IOP      3. Refractive error-

## 2023-12-07 ENCOUNTER — Encounter (HOSPITAL_BASED_OUTPATIENT_CLINIC_OR_DEPARTMENT_OTHER): Payer: Self-pay | Admitting: Ophthalmology

## 2023-12-07 ENCOUNTER — Encounter (LOCAL_COMMUNITY_HEALTH_CENTER): Payer: Self-pay

## 2023-12-07 ENCOUNTER — Encounter (HOSPITAL_BASED_OUTPATIENT_CLINIC_OR_DEPARTMENT_OTHER): Payer: Self-pay

## 2023-12-07 DIAGNOSIS — N1831 Chronic kidney disease, stage 3a: Secondary | ICD-10-CM

## 2023-12-07 DIAGNOSIS — I10 Essential (primary) hypertension: Secondary | ICD-10-CM

## 2023-12-10 ENCOUNTER — Encounter (HOSPITAL_BASED_OUTPATIENT_CLINIC_OR_DEPARTMENT_OTHER): Payer: Self-pay | Admitting: Cardiovascular Disease

## 2023-12-10 ENCOUNTER — Ambulatory Visit: Attending: Cardiovascular Disease | Admitting: Cardiovascular Disease

## 2023-12-10 ENCOUNTER — Ambulatory Visit
Admission: RE | Admit: 2023-12-10 | Discharge: 2023-12-10 | Disposition: A | Discharge status: Home / Self Care | Attending: Cardiovascular Disease | Admitting: Cardiovascular Disease

## 2023-12-10 ENCOUNTER — Other Ambulatory Visit: Payer: Self-pay

## 2023-12-10 VITALS — BP 129/65 | HR 82 | Temp 96.4°F | Wt 195.4 lb

## 2023-12-10 DIAGNOSIS — I10 Essential (primary) hypertension: Secondary | ICD-10-CM | POA: Insufficient documentation

## 2023-12-10 DIAGNOSIS — E669 Obesity, unspecified: Secondary | ICD-10-CM | POA: Diagnosis present

## 2023-12-10 DIAGNOSIS — I701 Atherosclerosis of renal artery: Secondary | ICD-10-CM | POA: Insufficient documentation

## 2023-12-10 DIAGNOSIS — N1831 Chronic kidney disease, stage 3a: Secondary | ICD-10-CM | POA: Diagnosis present

## 2023-12-10 DIAGNOSIS — R9439 Abnormal result of other cardiovascular function study: Secondary | ICD-10-CM | POA: Insufficient documentation

## 2023-12-10 DIAGNOSIS — R0609 Other forms of dyspnea: Secondary | ICD-10-CM | POA: Insufficient documentation

## 2023-12-10 LAB — CBC WITH PLATELET
ABSOLUTE NRBC COUNT: 0 10*3/uL (ref 0.0–0.0)
HEMATOCRIT: 33.3 % — ABNORMAL LOW (ref 34.1–44.9)
HEMOGLOBIN: 11 g/dL — ABNORMAL LOW (ref 11.2–15.7)
MEAN CORP HGB CONC: 33 g/dL (ref 31.0–37.0)
MEAN CORPUSCULAR HGB: 28.5 pg (ref 26.0–34.0)
MEAN CORPUSCULAR VOL: 86.3 fl (ref 80.0–100.0)
MEAN PLATELET VOLUME: 11 fL (ref 8.7–12.5)
NRBC %: 0 % (ref 0.0–0.0)
PLATELET COUNT: 279 10*3/uL (ref 150–400)
RBC DISTRIBUTION WIDTH STD DEV: 41 fL (ref 35.1–46.3)
RED BLOOD CELL COUNT: 3.86 M/uL — ABNORMAL LOW (ref 3.90–5.20)
WHITE BLOOD CELL COUNT: 6.6 10*3/uL (ref 4.0–11.0)

## 2023-12-10 LAB — PROTHROMBIN TIME
INR: 1.8 (ref 2.0–3.5)
PROTHROMBIN TIME: 19.5 s — ABNORMAL HIGH (ref 9.6–12.3)

## 2023-12-10 LAB — BASIC METABOLIC PANEL
ANION GAP: 14 mmol/L (ref 10–22)
BUN (UREA NITROGEN): 25 mg/dL — ABNORMAL HIGH (ref 7–18)
CALCIUM: 9.4 mg/dL (ref 8.5–10.5)
CARBON DIOXIDE: 29 mmol/L (ref 21–32)
CHLORIDE: 98 mmol/L (ref 98–107)
CREATININE: 1.5 mg/dL — ABNORMAL HIGH (ref 0.4–1.2)
ESTIMATED GLOMERULAR FILT RATE: 37 mL/min — ABNORMAL LOW (ref >60)
Glucose Random: 102 mg/dL (ref 74–160)
POTASSIUM: 3.6 mmol/L (ref 3.5–5.1)
SODIUM: 140 mmol/L (ref 136–145)

## 2023-12-10 LAB — HEPATIC FUNCTION PANEL
ALANINE AMINOTRANSFERASE: 17 U/L (ref 12–45)
ALBUMIN: 4.3 g/dL (ref 3.4–5.2)
ALKALINE PHOSPHATASE: 137 U/L — ABNORMAL HIGH (ref 45–117)
ASPARTATE AMINOTRANSFERASE: 19 U/L (ref 8–34)
BILIRUBIN DIRECT: 0.2 mg/dL (ref 0.0–0.2)
BILIRUBIN TOTAL: 0.4 mg/dL (ref 0.2–1.0)
INDIRECT BILIRUBIN: 0.2 mg/dL (ref 0.2–0.9)
TOTAL PROTEIN: 7.4 g/dL (ref 6.4–8.2)

## 2023-12-10 LAB — LIPID PANEL
Cholesterol: 132 mg/dL (ref 0–239)
HIGH DENSITY LIPOPROTEIN: 39 mg/dL — ABNORMAL LOW (ref 40–60)
LOW DENSITY LIPOPROTEIN DIRECT: 80 mg/dL (ref 0–189)
TRIGLYCERIDES: 140 mg/dL (ref 0–150)

## 2023-12-10 LAB — NT-PROBNP: NT-proBNP: 210 pg/mL — ABNORMAL HIGH (ref 0–125)

## 2023-12-10 MED ORDER — METOPROLOL SUCCINATE ER 50 MG PO TB24
50.0000 mg | ORAL_TABLET | Freq: Every day | ORAL | 3 refills | Status: AC
Start: 2023-12-10 — End: 2024-12-09

## 2023-12-10 NOTE — Progress Notes (Unsigned)
 CARDIOLOGY CLINIC CONSULT NOTE   Date of visit: 12/10/2023  Language: [place] phone interpreter used     History of present illness:   68 year old female with HTN, CKD 3, RAS, low burden of PAC and PVCS on holter, HL, overweight/obesity       Prior saw Dr Risser      10/26/23  Last week at pcp, seems computer read ecg as junctional rhythm but was of poor quailtiy - like nsr w/o sig changes   accompanied by her daughter   In 8/24 on vacation got HR reading as high as 150 a/w sxs of palps and chest pressure. Seems not having HR that fast since.   Bring home bp machine and bp numbers are excellent. Highest hr is 111 but most are 80-90  Having times w palps - last coupe weeks ago  No syncope  Having epigastric discomfort sxs that go into her chest, random. episodes occur randomly and are sometimes associated with chest discomfort, fatigue, and a sensation of fullness in the stomach. Stress or emotional upset can trigger these symptoms.  Two weeks ago, she had a significant episode while showering, which included weakness, chest pain, and vomiting. She monitors her heart rate using a blood pressure machine and has noted fluctuations, with recent readings being more stable, not exceeding 94 bpm this week.  She experiences persistent discomfort in her stomach, described as a feeling of fullness even without eating, and intermittent chest pain that occurs several times a day. The chest pain is not consistently related to physical activity or eating.  She has a history of fibromyalgia, contributing to significant musculoskeletal pain, particularly in her knees, legs, and hips, limiting her ability to walk outside the house. She reports severe fatigue and difficulty breathing after minimal exertion, such as walking to the reception desk.  Her current medications include citalopram, which she started approximately one month ago after discontinuing another medication that she believes exacerbated her symptoms. She also takes  omeprazole  for gastrointestinal symptoms.  No syncope, but can have presyncope. No nocturnal dyspnea, but she wakes up to use the bathroom  --stress and event monitor       12/10/23  has been experiencing worsening shortness of breath and fatigue over the past year, which began during a trip to [place]  in August of the previous year. During this trip, she experienced severe weakness and a rapid heartbeat exceeding 110 beats per minute. These symptoms have made it difficult for her to perform daily activities, such as taking a shower. No episodes of syncope, hematochezia, or bleeding problems. No orthopnea or peripheral edema.  She has a history of atrial fibrillation, identified through a monitor, and was started on metoprolol  and a blood thinner to reduce the risk of stroke. Metoprolol  has improved her symptoms of palpitations significantly but did not affect the DOE and exertional fatigue. Her current medications include metoprolol  25 mg, with instructions to increase to 50 mg, and Eliquis  twice daily. She is not currently taking spironolactone .  She also has a history of fibromyalgia, contributing to her fatigue and hip pain.   She recently consulted a pulmonologist and received a new inhaler and allergy pill, which she has not yet started using  --cath          Most Recent BP Reading(s)  12/10/23 : 129/65  11/29/23 : 122/68  11/23/23 : 132/60  11/16/23 : 145/78  10/28/23 : 153/75      Most Recent Weight Reading(s)  12/10/23 :  88.6 kg (195 lb 6.4 oz)  11/29/23 : 90.3 kg (199 lb)  10/26/23 : 89.4 kg (197 lb)  10/20/23 : 89.8 kg (198 lb)  07/20/23 : 88.9 kg (196 lb)       Patient Active Problem List:     Essential hypertension     Labyrinthitis     Panic disorder     Hyperlipidemia     Mild episode of recurrent major depressive disorder (HCC)     Allergic rhinitis     Chronic pain of both shoulders     Stage 3a chronic kidney disease     Anxiety state     Psychophysiological insomnia     Secondary  hyperparathyroidism (HCC)     Osteopenia     Carpal tunnel syndrome of right wrist     Fibromyalgia     Hair loss     Vitamin D  deficiency     Obesity (BMI 30-39.9)     Bilateral renal artery stenosis (HCC)     DOE (dyspnea on exertion)     Recurrent epistaxis     Gastroesophageal reflux disease without esophagitis     Other specified hypothyroidism     Combined forms of age-related cataract of right eye     Cervical cancer screening     Prediabetes     Healthcare maintenance     Schistosomiasis     Intraoperative floppy iris syndrome (IFIS)     Combined forms of age-related cataract of left eye     Varicose veins of calf     Chronic bilateral low back pain without sciatica     Morbid obesity with BMI 40.0-44.9, adult (HCC)     Abnormal mammogram     Tubular adenoma of colon     Angina pectoris [facility])     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 incontinence     Gastritis without bleeding     Atrial fibrillation with RVR (HCC)      Allergies:  Review of Patient's Allergies indicates:   Pollen extract          Cough    Meds:  Current Outpatient Medications   Medication Instructions    amlodipine -valsartan  (EXFORGE ) 5-320 MG per tablet 1 tablet, Oral, DAILY    apixaban  (ELIQUIS ) 5 mg, Oral, 2 TIMES DAILY    atorvastatin  (LIPITOR) 80 mg, Oral, DAILY    Blood Pressure KIT 1 kit, Does not apply, DAILY BEFORE BREAKFAST, Regular ICD i10    budesonide -formoterol  (SYMBICORT ,BREYNA ) 160-4.5 MCG/ACT inhaler 2 puffs, Inhalation, 2 TIMES DAILY, Use 1 puff prn during the day. Rinse mouth after use.    calcium  citrate-citamin D (CALCIUM  CITRATE +) 315-5 MG-MCG tablet 1 tablet, Oral, 2 TIMES DAILY    cetirizine  (ZYRTEC ) 10 mg, Oral, DAILY    chlorthalidone  (HYGROTEN) 25 mg, Oral, DAILY    cholecalciferol  (VITAMIN D3) 2,000 Units, Oral, 2 TIMES DAILY    escitalopram  (LEXAPRO ) 10 mg, Oral, DAILY, With breakfast    famotidine  (PEPCID ) 40 mg, Oral, Nightly    ferrous sulfate  325 mg, Oral, EVERY OTHER DAY    levothyroxine  (SYNTHROID ) 50 mcg, Oral, EVERY MORNING  BEFORE BREAKFAST    LORazepam  (ATIVAN ) 0.5 mg, Oral, 2 TIMES DAILY    meclizine  (ANTIVERT ) 25 mg, Oral, EVERY 8 HOURS PRN    metoprolol  (TOPROL -XL) 25 mg, Oral, DAILY    nitroGLYCERIN  (NITROSTAT ) 0.4 mg, Sublingual, EVERY 5 MIN PRN    omeprazole  (PRILOSEC) 40 mg, Oral, DAILY    OTHER MEDICATION 1 each, Does not apply, DAILY,  Blood Pressure Monitoring Kit   Arm Circumference LARGE  For use as directed: Please Dispense AMA validated cuff see [email or web address] (such as [employer])       DX: hypertension  ICD10: I10    spironolactone  (ALDACTONE ) 50 mg, Oral, DAILY, STOP ATENOLOL , START THIS MEDICATION    torsemide  (DEMADEX ) 10 mg, Oral, DAILY    travoprost , BAK Free, (TRAVATAN  Z) 0.004 % ophthalmic solution 1 drop, Both Eyes, NIGHTLY       Social History:  mother of 5 and GM of 3   resides with her husband and one dau and one grandchild.   No tob, eoth, drugs    Family History:  Son - needs heart surgery, ? [place], in [place]   Daughter - ?arrhythmia,?procedure, in [place]       ROS: All other systems reviewed were pertinently negative apart from the history of present illness.    PHYSICAL EXAM:   12/10/23  1126   BP: 129/65   Site: Left Arm   Position: Sitting   Cuff Size: Regular   Pulse: 82   Temp: 96.4 F (35.8 C)   TempSrc: Temporal   SpO2: 98%   Weight: 88.6 kg (195 lb 6.4 oz)     0 lb (0 kg)  Body mass index is 39.47 kg/m.  General: NAD, well developed, well nourished.  HEENT: Oral mucosa is moist, no icterus, no pallor noted.  NECK: No observable jugular venous distention, no carotid bruits, 2+ carotid upstrokes.  CHEST: Clear to auscultation, no crackles or rhonchi. Nontender.  HEART:  No heaves or lifts. Regular rate and rhythm.  No sig murmur. No ectopy  ABDOMEN: NABS. Soft. Nontender and nondistended.   EXTREMITIES: Warm and well perfused. No edema. 2+ pulses.   NEURO: A&O X 3, moves all extremities. Grossly nonfocal.  PSYCHIATRIC: Mood and affect are appropriate. anxious      DATA:  LABS:   Lab Results   Component  Value Date    NA 136 10/20/2023    K 3.4 (L) 10/20/2023    CL 93 (L) 10/20/2023    CO2 29 10/20/2023    BUN 23 (H) 10/20/2023    CREAT 1.5 (H) 10/20/2023    GLUCOSER 104 10/20/2023    LDL 153 07/14/2021    HDL 44 07/14/2021    TG 148 06/13/2020    ALT 17 10/20/2023    AST 21 10/20/2023    TSH 7.090 (H) 01/14/2021     NT-proBNP (pg/mL)   Date Value   10/20/2023 182 (H)      EKG: (on my personal review) 10/26/23 nsr, lvh in avl. Qrs 104. Minor st abnormalities lateral leads. Similar to prior.     Holter 03/17/2019  Sinus at 70 with range 52 to 100 BPM.   Approximately 600 APC's = 0.6% of all beats, including 15 couplets and 7 short relatively slow runs.   Approximately 600 VPC's = 0.6% of all beats, including 2 couplets but no runs.   No symptoms.     Holter 10/08/20  Sinus at 79 with range 54 to 121 BPM.   140 APC's including 4 coupletes and one 5-beat run at 130 BPM.    500 VPC's = 0.5% fo all beats, with no couplets or runs.   No symptoms.     Lexiscan  stress 02/26/21  Up to 1 mm of flat ST depression developed in the inferolateral leads after Lexiscan  infusion.  There was no concomitant chest pain   Noinfarct or ischemia.  Normal  cardiac wall motion and ejection fraction    01/19/23  Right - The renal artery has evidence of 60-99% stenosis. RI values are elevated indicating intrinsic disease. Kidney length is normal. Renal vein is patent.  Left - The renal artery has evidence of 60-99% stenosis. RI values are elevated indicating intrinsic disease. Kidney length is normal. Renal vein is patent.  Note: SMA has evidence of elevated velocities indicating greater than 70% stenosis.    Echo 01/19/23  1. LVEF 60% .  2.   no regional wall motion abnormalities.  3. grade II diastolic dysfunction.  4. Left Atrium: Moderately dilated.  5. Aortic Valve:  mild sclerosis; no stenosis.  mild aortic regurgitation.  6.  RVSP  30 mmHg which is normal.  7.  no pericardial effusion    Zio 5/25  12 days 20 hours of monitoring after artifact  removed  - Predominant sinus rhythm.  Heart rate in sinus 55-122, average 72 bpm  - There were 320 runs of SVT.  The fastest SVT was 6 beats with a maximum rate of 194.  The longest SVT was 8.1 seconds with an average rate of 115 bpm.  Some SVT is atrial tachycardia with variable block  - Atrial fibrillation occurred 2% of the time with heart rates in atrial fibrillation 71-188, average 104.  The longest episode of A-fib was 23 minutes 11 seconds with an average heart rate of 120 bpm.  There were 212 runs of afib and 8 episodes that were 6 minutes or longer.   - Isolated PACs comprise 1.8% of beats.  PAC couplets and triplets were rare  - Isolated PVCs were infrequent.  PVC couplets were rare.  No ventricular runs  - There were no patient events    Stress 11/23/23  Lexiscan   Abnormal ecg with occasional PVCs  There is a small fixed defect of the anterior and anteroseptal wall. Breast artifact is also possible      Assessment and Plan:  Atrial fibrillation   New dx PAF on zio 5/25. Sxs of palps sig improved w metoprolol . Afib was low burden 2% on zio. Started on eliquis   -- Increase metoprolol  to 50 mg  -Continue Eliquis      DOE/fatigue  Significant exertional symptoms that are longstanding.  EKG was quite abnormal for Lexi scan although imaging was not that impressive.  Concern for balanced ischemia.  Discussed option of cardiac catheterization and patient would like to proceed  Bnp 210 iso ckd not impressive  - Cardiac cath  - Increase metoprolol   -Continue with plans from pulmonary to switch inhaler       Hypertension  Goal <130/80  Blood pressure well-controlled   -Given creatinine and increase metoprolol  decrease chlorthalidone  to 12.5      Chronic kidney disease, renal artery stenosis   Follows w renal. Pt says not taking spironolactone    Cr 1.3-1.5      Lipids  On atorva 80  LDL 80 in 6/25  - Await.  Low threshold for ezetimibe    Fibromyalgia  Significant musculoskeletal pain affecting mobility, particularly in  knees, legs, and hips. Limits walking  Per other providers    Depression/anxiety  Does seems to affect sxs      Mild AR  Echo by 2027, earlier if indicated        Renovascular hypertension  Managed with amlodipine -valsartan  and chlorthalidone .      The patient verbally consented to an audio recording of their visit to assist with the completion  of documentation. The patient is aware the recording is not retained after the visit is summarized.  Attestation           I spent a total of 42 minutes on this visit on the date of service (total time includes all activities performed on the date of service such as chart review, time speaking with the patient, documenting and communicating with other providers)   Thank you for the privilege of involving me in this patient's care.  Please feel free to contact me if you have any questions.  This patient encounter note was created using voice-recognition software and in real time. Please excuse any typographical errors that have not been edited out.     Electronically signed by: Geni LITTIE Sprang, MD

## 2023-12-11 ENCOUNTER — Other Ambulatory Visit (HOSPITAL_BASED_OUTPATIENT_CLINIC_OR_DEPARTMENT_OTHER): Payer: Self-pay

## 2023-12-11 DIAGNOSIS — I10 Essential (primary) hypertension: Secondary | ICD-10-CM

## 2023-12-11 NOTE — Telephone Encounter (Signed)
 PER Pharmacy, [name] is a 68 year old female has requested a refill of     -  chlorthalidone       Last Office Visit: 11/16/23 with PCP  Last Physical Exam: 06/13/20    There are no preventive care reminders to display for this patient.    Other Med Adult:  Most Recent BP Reading(s)  12/10/23 : 129/65        Cholesterol (mg/dL)   Date Value   93/79/7974 132     LOW DENSITY LIPOPROTEIN DIRECT (mg/dL)   Date Value   93/79/7974 80     HIGH DENSITY LIPOPROTEIN (mg/dL)   Date Value   93/79/7974 39 (L)     TRIGLYCERIDES (mg/dL)   Date Value   93/79/7974 140         THYROID  SCREEN TSH REFLEX FT4 (uIU/mL)   Date Value   10/20/2023 3.430         TSH (THYROID  STIM HORMONE) (uIU/mL)   Date Value   01/14/2021 7.090 (H)       HEMOGLOBIN A1C (%)   Date Value   12/03/2022 6.9 (H)       POC HEMOGLOBIN A1C (%)   Date Value   10/20/2023 6.5 (H)         INR (no units)   Date Value   12/10/2023 1.8       SODIUM (mmol/L)   Date Value   12/10/2023 140       POTASSIUM (mmol/L)   Date Value   12/10/2023 3.6           CREATININE (mg/dL)   Date Value   93/79/7974 1.5 (H)       Documented patient preferred pharmacies:    [facility] [place], [place] - [address]  Phone: [phone] Fax: [phone]

## 2023-12-13 ENCOUNTER — Ambulatory Visit (HOSPITAL_BASED_OUTPATIENT_CLINIC_OR_DEPARTMENT_OTHER): Payer: Self-pay | Admitting: Cardiovascular Disease

## 2023-12-14 ENCOUNTER — Encounter (HOSPITAL_BASED_OUTPATIENT_CLINIC_OR_DEPARTMENT_OTHER): Payer: Self-pay

## 2023-12-14 ENCOUNTER — Other Ambulatory Visit: Payer: Self-pay

## 2023-12-14 ENCOUNTER — Ambulatory Visit

## 2023-12-14 ENCOUNTER — Other Ambulatory Visit (HOSPITAL_BASED_OUTPATIENT_CLINIC_OR_DEPARTMENT_OTHER): Payer: Self-pay

## 2023-12-14 ENCOUNTER — Ambulatory Visit (HOSPITAL_BASED_OUTPATIENT_CLINIC_OR_DEPARTMENT_OTHER): Payer: Self-pay

## 2023-12-14 ENCOUNTER — Ambulatory Visit
Admission: RE | Admit: 2023-12-14 | Discharge: 2023-12-14 | Disposition: A | Discharge status: Home / Self Care | Payer: No Typology Code available for payment source | Attending: Diagnostic Radiology | Admitting: Diagnostic Radiology

## 2023-12-14 VITALS — BP 129/67 | HR 81

## 2023-12-14 DIAGNOSIS — R92322 Mammographic fibroglandular density, left breast: Secondary | ICD-10-CM | POA: Insufficient documentation

## 2023-12-14 DIAGNOSIS — R928 Other abnormal and inconclusive findings on diagnostic imaging of breast: Secondary | ICD-10-CM

## 2023-12-14 DIAGNOSIS — K08109 Complete loss of teeth, unspecified cause, unspecified class: Secondary | ICD-10-CM

## 2023-12-14 NOTE — Progress Notes (Signed)
 Patient presented for removable denture appointment. Denture process has been explained to the patient in detail. Expectations for dentures have been established, and limitations explained.    Patient pain level at beginning of appointment:     Interpreter: [place] Phone    *Medical/Dental History*  Medical history reviewed and updated.    *Denture Treatment*  Prosthesis type: Max & Mand complete dentures  Denture step(s) performed: Delivery  Acrylic base shade selection: Standard Pink  Checked and adjusted as needed: High spots, Vertical dimension, Occlusion, Phonetics, Esthetics and Retention and stability   Lab: [place]    *Assessment*  Patient would like to continue with: One-on-one appointments  Consent signed by patient for final processing: Yes  Assessment/Plan: Verified prostheiss form and function, reviewed home care, hygiene, and denture care instructions and dietary recommendations, all questions regarding care answered and understoo    Patient pain level at end of appointment: 2    Patient satisfied with prosthesis and will continue treatment until complete. Case sent to lab with detailed prescription. Patient left ambulatory, satisfied with treatment. Any remaining planned treatments were explained in detail to the patient.       NV: Denture Adjustment/POE

## 2023-12-14 NOTE — Telephone Encounter (Addendum)
 Called patient's daughter, [place] ([place]). Results and plan of care discussed as noted below. Daughter confirmed understanding and had no further questions.     ----- Message from Geni LITTIE Sprang sent at 12/13/2023 10:12 PM EDT -----  Dear Nursing    Please:    1. Create Telephone encounter for this patient.  2. Share with the patient attached results    Plan:  1. Pls let dagthers know that labs from 6/20 are stable. They should hear from [facility] about the scheduling the cardiac cath sometimes this week     2. Type of Outreach: 3 phone calls and if unable to reach send letter or can send my chart message if patient uses mychart    3. Document the conversation in the Telephone Encounter     Thank you,  Geni LITTIE Sprang, MD    ----- Message -----  From: Interface, Lab  Sent: 12/10/2023   2:20 PM EDT  To: Geni LITTIE Sprang, MD

## 2023-12-15 ENCOUNTER — Telehealth (HOSPITAL_BASED_OUTPATIENT_CLINIC_OR_DEPARTMENT_OTHER): Payer: Self-pay

## 2023-12-15 NOTE — Telephone Encounter (Signed)
 Soke with patients daughter. States mother eyes are burning and painful after using Travaprost. Explained glaucoma drops can burn and irritate eye a little. Per Dr. Patalano will switch drop to Cosopt 2x a day.     Patient to stop Travaprost for 3 days and then start new drop twice per day.    Advised patient per Dr. Patalano there are only a few classes of glaucoma drops to try.    Will sent RX to pharamcy

## 2023-12-15 NOTE — Telephone Encounter (Signed)
-----   Message from [facility] sent at 12/15/2023 11:38 AM EDT -----  [facility]    Sara Snyder [id number], 68 year old, female, Telephone Information:  Home Phone      [phone]  Work Phone      [phone]  Mobile          [phone]      Patient's Preferred Pharmacy:     [facility] [place], [place] - [address]  Phone: [phone] Fax: [phone]      CONFIRMED TODAY: Sara Snyder NUMBER: [phone]  Best time to call back:   Cell phone:   Other phone:    Available times:    Patient's language of care: [place] ([place]    Patient needs a [place] interpreter.    Patient's PCP: [name], MD    Person calling on behalf of patient: Daughter    Calls today with questions and concerns.Pt is experiencing eye pain after taking Travatan  drops.  Woke up this morning with redness in both eyes and some pain.

## 2023-12-21 ENCOUNTER — Ambulatory Visit

## 2023-12-21 ENCOUNTER — Other Ambulatory Visit: Payer: Self-pay

## 2023-12-21 ENCOUNTER — Ambulatory Visit (HOSPITAL_BASED_OUTPATIENT_CLINIC_OR_DEPARTMENT_OTHER)

## 2023-12-21 VITALS — BP 135/70 | HR 87

## 2023-12-21 DIAGNOSIS — K08109 Complete loss of teeth, unspecified cause, unspecified class: Secondary | ICD-10-CM

## 2023-12-21 NOTE — Progress Notes (Signed)
 Dental procedures in this visit    [id number] - DENTURE ADJUSTMENT 2,3,4,5,6,7,8,9,10,11,12,13,14,15 (Completed)     Service provider: Sherrell Marylee RAMAN, DMD     Billing provider: Sherrell Marylee RAMAN, DMD    [id number] - DENTURE ADJUSTMENT 18,19,20,21,22,23,24,25,26,27,28,29,30,31 (Completed)     Service provider: Sherrell Marylee RAMAN, DMD     Billing provider: Sherrell Marylee RAMAN, DMD    [phone] - PERIODIC ORAL EVALUATION - ESTABLISHED PATIENT (Completed)     Service provider: Sherrell Marylee RAMAN, DMD     Billing provider: Sherrell Marylee RAMAN, DMD     Subjective   Patient ID: Sara Snyder is a 68 year old female.  The denture is working ok, it's still not great    Objective   Recently delivered CDs  Pt reports some loseness and soreness on gums - has not been wearing lower denture much  Checked with PIP paste and no significant signs of inflmamaiton, mild palpation tenderness generalized  Verified bite is balanced with no high spots, dentures seating without rocking, and VDO verified for correct height and attributes    Perio:   Perio diagnosis: Edentulous  Perio treatment recommendations: Remove dentures at night and clean  Frequency: Every 12 months.  Fluoride Recommendations: None  Informed pt to try and use denture adhesive after completing minor chairside adjustments to patient and clinician satisfaction    Reviewed denture care home instructions, hygiene, and dietary recommendations    All questions regarding care answered and understoo,d pt dismissed ambulaotry satisfied with care    Marylee RAMAN Sherrell, DMD discussed findings associated with the patient's periodontal and caries status.    NV: Denture adjustments as needed    Assessment & Plan      Diagnoses and all orders for this visit:  Teeth missing  Other orders  -     DENTURE ADJUSTMENT  -     DENTURE ADJUSTMENT  -     PERIODIC ORAL EVALUATION - ESTABLISHED PATIENT

## 2023-12-22 NOTE — Procedures (Signed)
 Moderate Sedation                                                                                                                       Date of Service: 12/22/2023    Patient: Sara Snyder  DOB: 03-24-1956   MRN#: [id number]  CSN: [id number]  Attending: Camellia Pon, MD  Admit Date: (Not on file)  Primary Care Physician: Sara SAUNDERS. Veleria, MD           Chief Complaint / Indication for Procedure: Pre-Op Diagnosis Codes:      * Angina pectoris (HCC) [I20.9]     History:   Past Medical History[1]  Past Surgical History[2]  Family History[3]  Social History[4]    Allergies:   Patient has no known allergies.    Vitals:  There were no vitals filed for this visit.    Height: 150 cm (4' 11.06)  Actual Weight: 90 kg (198 lb 6.6 oz)    Physical Exam:  Pulmonary: WNL  Cardiovascular: WNL  Abdominal: WNL  ASA Classification: II - Mild Systemic Disease  Mallampati / Airway Assessment: Class 2 - Soft palate, fauces, and uvula seen      Assessment & Plan:  89 F w HTN,  HLD, CKD 3, RAS, low burden of PAC and PVCS on holter, HL, newly diagnosed Afib now on Metoprolol  and Eliquis , overweight/obesity describes progressive dyspnea on exertion now interfering with daily activities. Nuclear stress with a small fixed defect in the anterior / anteroseptal wall. Now referred for cor angio.     Referring Cardiologist:  Geni Sprang     Prior Imaging:  TTE 12/2022  LVEF 60%. Grade II dysfunction. 1+ AR.    Stress 11/23/23  Lexiscan   Abnormal ecg with occasional PVCs  There is a small fixed defect of the anterior and anteroseptal wall. Breast artifact is also possible       Prior Labs:   No results found for: GLUCOSE, CALCIUM , NA, K, CO2, CL, BUN, CREATININE    No results found for: WBC, HGB, HCT, MCV, PLT    No results found for: HCGQUANT, PREGTESTUR, POCHCGUR    Cardiac Meds:  Amlodipine -valsartan  5-320, apixaban  5, atorva 80, chlorthalidone  25, metop succ 25, spiro 50, torsemide  10    Signed By: Comer Ashworth,  MD             [1]  Past Medical History:  Diagnosis Date   . Hypertension    . Mixed dyslipidemia    . Renal disorder    [2]  Past Surgical History:  Procedure Laterality Date   . cataracts      surgery   [3]  Family History  Family history unknown: Yes   [4]  Social History  Tobacco Use   . Smoking status: Former     Types: Cigarettes     Start date: 28   . Smokeless tobacco: Never   Substance Use Topics   . Drug use: Never

## 2023-12-22 NOTE — Discharge Summary (Signed)
 Patient is awake, alert, oriented x4. Patient tolerating oral intake. Out of bed, ambulated at baseline. Patient was able to void. All discharge instructions explained to patient, patient verbalized understanding. IV access discontinued, site benign, dressing applied. TR Band removed per protocol. Site intact. Belongings reconciled, all belongings sent home with patient. Patient discharged home, accompanied by responsible adult, patient's 3 daughters are here to drive her home.

## 2023-12-23 ENCOUNTER — Encounter (HOSPITAL_BASED_OUTPATIENT_CLINIC_OR_DEPARTMENT_OTHER): Payer: Self-pay | Admitting: Cardiovascular Disease

## 2023-12-23 ENCOUNTER — Telehealth (HOSPITAL_BASED_OUTPATIENT_CLINIC_OR_DEPARTMENT_OTHER): Payer: Self-pay

## 2023-12-23 ENCOUNTER — Ambulatory Visit (HOSPITAL_BASED_OUTPATIENT_CLINIC_OR_DEPARTMENT_OTHER): Admitting: Pulmonary Disease

## 2023-12-23 NOTE — Telephone Encounter (Signed)
 Following up on Results for Dr. Burnetta who is away.  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  No intervention.  Had elevated LVEDP.  Was recommended to increase diuretic dose.    Patient is currently on torsemide  10mg  daily.  Would recommend to increase to 20 mg daily and repeat labs in 1 week.    I called the patient with a [place] interpreter, no answer at home phone or cell phone for Sara Snyder.  Left a message to call back the cardiology clinic.  Will check in again Monday.

## 2023-12-27 NOTE — Telephone Encounter (Signed)
 Spoke w daughter via interpreter  Pt does have cholesterol build up in the heart. Interventionist called me at time of the cath. Given ischemia on stress not in region w more sig CAD, co-dominant, ckd, elevated lvedp, possible need for bifurcation sent plan made for med rx and can pursue stent if sxs not improved  Does seem pt feels better on higher dose metoprolol   Seems to be taking new inhaler and allergy pill  Takes torsemide  at night - pt prefers to take at night  Increase torsemide  to 20 mg nightly  Appt w me 7/11 at 9 am  Geni LITTIE Sprang, MD, 12/27/2023, 3:33 PM

## 2023-12-31 ENCOUNTER — Other Ambulatory Visit: Payer: Self-pay

## 2023-12-31 ENCOUNTER — Encounter (HOSPITAL_BASED_OUTPATIENT_CLINIC_OR_DEPARTMENT_OTHER): Payer: Self-pay | Admitting: Cardiovascular Disease

## 2023-12-31 ENCOUNTER — Ambulatory Visit: Attending: Cardiovascular Disease | Admitting: Cardiovascular Disease

## 2023-12-31 VITALS — BP 135/66 | HR 78 | Temp 96.3°F | Wt 194.6 lb

## 2023-12-31 DIAGNOSIS — I10 Essential (primary) hypertension: Secondary | ICD-10-CM | POA: Insufficient documentation

## 2023-12-31 DIAGNOSIS — I701 Atherosclerosis of renal artery: Secondary | ICD-10-CM | POA: Diagnosis present

## 2023-12-31 DIAGNOSIS — R0609 Other forms of dyspnea: Secondary | ICD-10-CM | POA: Diagnosis present

## 2023-12-31 DIAGNOSIS — E669 Obesity, unspecified: Secondary | ICD-10-CM | POA: Diagnosis present

## 2023-12-31 DIAGNOSIS — N1831 Chronic kidney disease, stage 3a: Secondary | ICD-10-CM | POA: Diagnosis present

## 2023-12-31 DIAGNOSIS — I25118 Atherosclerotic heart disease of native coronary artery with other forms of angina pectoris: Secondary | ICD-10-CM | POA: Diagnosis present

## 2023-12-31 MED ORDER — AMLODIPINE BESYLATE-VALSARTAN 10-320 MG PO TABS
1.0000 | ORAL_TABLET | Freq: Every day | ORAL | 1 refills | Status: DC
Start: 2023-12-31 — End: 2024-02-01

## 2023-12-31 MED ORDER — TORSEMIDE 10 MG PO TABS: 20.0000 mg | ORAL_TABLET | Freq: Every day | ORAL | 1 refills | Status: DC

## 2023-12-31 MED ORDER — EZETIMIBE 10 MG PO TABS
10.0000 mg | ORAL_TABLET | Freq: Every day | ORAL | 3 refills | Status: DC
Start: 2023-12-31 — End: 2024-02-01

## 2023-12-31 MED ORDER — APIXABAN 5 MG PO TABS: 5.0000 mg | ORAL_TABLET | Freq: Two times a day (BID) | ORAL | 3 refills | Status: DC

## 2023-12-31 NOTE — Progress Notes (Signed)
 CARDIOLOGY CLINIC CONSULT NOTE   Date of visit: 12/31/2023  Language: [place] phone interpreter used     History of present illness:   68 year old female with HTN, CKD 3, RAS, low burden of PAC and PVCS on holter, HL, overweight/obesity       Prior saw Dr Risser      10/26/23    In 8/24 on vacation got HR reading as high as 150 a/w sxs of palps and chest pressure. Seems not having HR that fast since.   Having times w palps   No syncope  epigastric discomfort sxs into chest, random. sometimes associated with chest discomfort, fatigue, and a sensation of fullness in the stomach. Stress or emotional upset can trigger these symptoms.  persistent discomfort in her stomach,fullness even without eating,  intermittent chest pain several times a day. cp not consistently related to physical activity or eating.  fibromyalgia, significant musculoskeletal pain, in  knees, legs, and hips, limiting her ability to walk outside the house.  citalopram started approximately one month ago after discontinuing another medication that she believes exacerbated her symptoms.  takes omeprazole  for gastrointestinal symptoms.  --stress and event monitor    12/10/23  worsening sob and fatigue x1 yr since trip to [place]  in 8/24.  severe weakness and a rapid heartbeat exceeding 110 beats per minute   Dx PAF, started on metoprolol  and a blood thinner . Metoprolol    improved palpitations significantly but did not affect  DOE and exertional fatigue.     --cath     12/31/23  See phone note 12/27/2023.  When I spoke with daughter did seem like some symptoms may be better on higher dose metoprolol .  Did increase torsemide  as recommended.  Today patient seems to say her symptoms are overall unchanged.  Still needs help after showering due to shortness of breath and fatigue.  Again says she can experience pain that starts in the stomach goes around to the back and up to the lower chest and causes fatigue.  Seems to occur randomly has not gotten better or  worse over time  has been prescribed a new inhaler and allergy pill but has not yet noticed any changes as she just started using them.  Her blood pressure is monitored daily by her daughter in the evening and tends to run low normal 105-115/60   has a prescription for nitroglycerin  for emergencies but has not used it yet.  -- Stop chlorthalidone , increase amlodipine , add ezetimibe , can trial NTG        Most Recent Pulse Reading(s)  12/31/23 : 78  12/21/23 : 87  12/14/23 : 81  12/10/23 : 82  11/29/23 : 80         Most Recent BP Reading(s)  12/21/23 : 135/70  12/14/23 : 129/67  12/10/23 : 129/65  11/29/23 : 122/68  11/23/23 : 132/60      Most Recent Weight Reading(s)  12/14/23 : 88.6 kg (195 lb 5.2 oz)  12/10/23 : 88.6 kg (195 lb 6.4 oz)  11/29/23 : 90.3 kg (199 lb)  10/26/23 : 89.4 kg (197 lb)  10/20/23 : 89.8 kg (198 lb)       Patient Active Problem List:     Essential hypertension     Labyrinthitis     Panic disorder     Hyperlipidemia     Mild episode of recurrent major depressive disorder (HCC)     Allergic rhinitis     Chronic pain of both shoulders  Stage 3a chronic kidney disease     Anxiety state     Psychophysiological insomnia     Secondary hyperparathyroidism (HCC)     Osteopenia     Carpal tunnel syndrome of right wrist     Fibromyalgia     Hair loss     Vitamin D  deficiency     Obesity (BMI 30-39.9)     Bilateral renal artery stenosis (HCC)     DOE (dyspnea on exertion)     Recurrent epistaxis     Gastroesophageal reflux disease without esophagitis     Other specified hypothyroidism     Combined forms of age-related cataract of right eye     Cervical cancer screening     Prediabetes     Healthcare maintenance     Schistosomiasis     Intraoperative floppy iris syndrome (IFIS)     Combined forms of age-related cataract of left eye     Varicose veins of calf     Chronic bilateral low back pain without sciatica     Morbid obesity with BMI 40.0-44.9, adult (HCC)     Abnormal mammogram     Tubular adenoma  of colon     Angina pectoris [facility]     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 incontinence     Gastritis without bleeding     Atrial fibrillation with RVR (HCC)      Allergies:  Review of Patient's Allergies indicates:   Pollen extract          Cough    Meds:  Current Outpatient Medications   Medication Instructions    amlodipine -valsartan  (EXFORGE ) 5-320 MG per tablet 1 tablet, Oral, DAILY    apixaban  (ELIQUIS ) 5 mg, Oral, 2 TIMES DAILY    atorvastatin  (LIPITOR) 80 mg, Oral, DAILY    Blood Pressure KIT 1 kit, Does not apply, DAILY BEFORE BREAKFAST, Regular ICD i10    budesonide -formoterol  (SYMBICORT ,BREYNA ) 160-4.5 MCG/ACT inhaler 2 puffs, Inhalation, 2 TIMES DAILY, Use 1 puff prn during the day. Rinse mouth after use.    calcium  citrate-citamin D (CALCIUM  CITRATE +) 315-5 MG-MCG tablet 1 tablet, Oral, 2 TIMES DAILY    chlorthalidone  (HYGROTEN) 12.5 mg, Oral, DAILY    cholecalciferol  (VITAMIN D3) 2,000 Units, Oral, 2 TIMES DAILY    escitalopram  (LEXAPRO ) 10 mg, Oral, DAILY, With breakfast    famotidine  (PEPCID ) 40 mg, Oral, Nightly    ferrous sulfate  325 mg, Oral, EVERY OTHER DAY    levothyroxine  (SYNTHROID ) 50 mcg, Oral, EVERY MORNING BEFORE BREAKFAST    meclizine  (ANTIVERT ) 25 mg, Oral, EVERY 8 HOURS PRN    metoprolol  (TOPROL -XL) 50 mg, Oral, DAILY    nitroGLYCERIN  (NITROSTAT ) 0.4 mg, Sublingual, EVERY 5 MIN PRN    omeprazole  (PRILOSEC) 40 mg, Oral, DAILY    OTHER MEDICATION 1 each, Does not apply, DAILY, Blood Pressure Monitoring Kit   Arm Circumference LARGE  For use as directed: Please Dispense AMA validated cuff see [email or web address] (such as [employer])       DX: hypertension  ICD10: I10    torsemide  (DEMADEX ) 10 mg, Oral, DAILY    travoprost , BAK Free, (TRAVATAN  Z) 0.004 % ophthalmic solution 1 drop, Both Eyes, NIGHTLY       Social History:  mother of 5 and GM of 3   resides with her husband and one dau and one grandchild.   No tob, eoth, drugs    Family History:  Son - needs heart surgery, ? [facility], in [place]   Daughter -  ?arrhythmia,?procedure, in [place]   ROS: All other systems reviewed were pertinently negative apart from the history of present illness.    PHYSICAL EXAM:   12/31/23  0911   BP: 135/66   Site: Left Arm   Position: Sitting   Cuff Size: Regular   Pulse: 78   Temp: 96.3 F (35.7 C)   TempSrc: Temporal   SpO2: 98%   Weight: 88.3 kg (194 lb 9.6 oz)       0 lb (0 kg)  Body mass index is 39.3 kg/m.  General: NAD, well developed, well nourished.  HEENT: Oral mucosa is moist, no icterus, no pallor noted.  NECK: No observable jugular venous distention,  .  CHEST: Clear to auscultation, no crackles or rhonchi. Nontender.  HEART:  No heaves or lifts. Regular rate and rhythm.  1/6 SEM.  No ectopy  ABDOMEN: NABS. Soft. Nontender and nondistended.   EXTREMITIES: Warm and well perfused. No edema. 2+ pulses.   NEURO: A&O X 3, moves all extremities. Grossly nonfocal.  PSYCHIATRIC: Mood and affect are appropriate. anxious      DATA:  LABS:   Lab Results   Component Value Date    NA 140 12/10/2023    K 3.6 12/10/2023    CL 98 12/10/2023    CO2 29 12/10/2023    BUN 25 (H) 12/10/2023    CREAT 1.5 (H) 12/10/2023    GLUCOSER 102 12/10/2023    LDL 80 12/10/2023    HDL 39 (L) 12/10/2023    TG 140 12/10/2023    ALT 17 12/10/2023    AST 19 12/10/2023    TSH 7.090 (H) 01/14/2021     NT-proBNP (pg/mL)   Date Value   12/10/2023 210 (H)   10/20/2023 182 (H)      EKG: (on my personal review) 10/26/23 nsr, lvh in avl. Qrs 104. Minor st abnormalities lateral leads. Similar to prior.     Holter 03/17/2019  Sinus at 70 with range 52 to 100 BPM.   Approximately 600 APC's = 0.6% of all beats, including 15 couplets and 7 short relatively slow runs.   Approximately 600 VPC's = 0.6% of all beats, including 2 couplets but no runs.   No symptoms.     Holter 10/08/20  Sinus at 79 with range 54 to 121 BPM.   140 APC's including 4 coupletes and one 5-beat run at 130 BPM.    500 VPC's = 0.5% fo all beats, with no couplets or runs.   No symptoms.     Lexiscan  stress  02/26/21  Up to 1 mm of flat ST depression developed in the inferolateral leads after Lexiscan  infusion.  There was no concomitant chest pain   Noinfarct or ischemia.  Normal cardiac wall motion and ejection fraction    01/19/23  Right - The renal artery has evidence of 60-99% stenosis. RI values are elevated indicating intrinsic disease. Kidney length is normal. Renal vein is patent.  Left - The renal artery has evidence of 60-99% stenosis. RI values are elevated indicating intrinsic disease. Kidney length is normal. Renal vein is patent.  Note: SMA has evidence of elevated velocities indicating greater than 70% stenosis.    Echo 01/19/23  1. LVEF 60% .  2.   no regional wall motion abnormalities.  3. grade II diastolic dysfunction.  4. Left Atrium: Moderately dilated.  5. Aortic Valve:  mild sclerosis; no stenosis.  mild aortic regurgitation.  6.  RVSP  30 mmHg which is normal.  7.  no pericardial  effusion    Zio 5/25  12 days 20 hours of monitoring after artifact removed  - Predominant sinus rhythm.  Heart rate in sinus 55-122, average 72 bpm  - There were 320 runs of SVT.  The fastest SVT was 6 beats with a maximum rate of 194.  The longest SVT was 8.1 seconds with an average rate of 115 bpm.  Some SVT is atrial tachycardia with variable block  - Atrial fibrillation occurred 2% of the time with heart rates in atrial fibrillation 71-188, average 104.  The longest episode of A-fib was 23 minutes 11 seconds with an average heart rate of 120 bpm.  There were 212 runs of afib and 8 episodes that were 6 minutes or longer.   - Isolated PACs comprise 1.8% of beats.  PAC couplets and triplets were rare  - Isolated PVCs were infrequent.  PVC couplets were rare.  No ventricular runs  - There were no patient events    Stress 11/23/23  Lexiscan   Abnormal ecg with occasional PVCs  There is a small fixed defect of the anterior and anteroseptal wall. Breast artifact is also possible    Cardiac [facility] 12/22/23   Severe co-dominant  RCA and distal LCx bifurcation disease. Elevated   LVEDP.   Patient with anterior/anteroaplical ischemia on stress and primary complaint of dyspnea. LVEDP is marginally elevated and has small branch disease in distal Lcx which may require bifurcation stenting and co-dominant RCA disease   LAD - mid to distal LAD 60%  LCX mid 70%, OM 2 70%  RCA ostial 90%, prox-mid RCA 70%    Assessment and Plan:  Atrial fibrillation   New dx PAF on zio 5/25. Sxs of palps sig improved w metoprolol . Afib was low burden 2% on zio. Started on eliquis .  TSH normal 4/25  - Continue metoprolol  to 50 mg  -Continue Eliquis      CAD  Significant RCA and LCx disease but codominant.  Decision at the time of cath to try and med Rx first.  Not completely clear how much of DOE is angina versus CHF versus other  Plan is for aggressive med Rx.  If no improvement in symptoms would send for revascularization    Meds -no aspirin for now on Eliquis   Angina -went over how to trial nitroglycerin .  Mentioned that if it helps a lot there is a daily form of this we can try.  Increasing amlodipine .  Continue higher dose of metoprolol   DM2-has had A1c >= 6.5 x 2  Tob-no  Exercise-no dedicated exercise  Diet-patient says she avoids fatty foods  Weight-stable  Sleep-      HFpEF  BNP slightly elevated but may be lower than expected given obesity but does have CKD.  Elevated LVEDP on cath  -Continue with trial of torsemide  20 mg.  Labs by next visit    DOE/fatigue  - Plans as above.  Likely multifactorial        Hypertension  Goal <130/80  Blood pressure well-controlled   - Stopping chlorthalidone  given increasing amlodipine   - Send in home numbers in 1-2 weeks      Chronic kidney disease, renal artery stenosis   Follows w renal. Pt says not taking spironolactone . On ARB   Cr 1.3-1.5  Consider SGLT2 inhibitor in the future      Lipids  With CAD goal LDL <55-70.  LDL in the 80s on atorvastatin  80 mg  - Add ezetimibe     Fibromyalgia  Significant  musculoskeletal pain  affecting mobility, particularly in knees, legs, and hips. Limits walking  Per other providers    Depression/anxiety  Does seems to affect sxs      Mild AR  Echo by 2027, earlier if indicated        Renovascular hypertension  Managed with amlodipine -valsartan      The patient verbally consented to an audio recording of their visit to assist with the completion of documentation. The patient is aware the recording is not retained after the visit is summarized.           I spent a total of 45 minutes on this visit on the date of service (total time includes all activities performed on the date of service such as chart review, time speaking with the patient, documenting and communicating with other providers)   Thank you for the privilege of involving me in this patient's care.  Please feel free to contact me if you have any questions.  This patient encounter note was created using voice-recognition software and in real time. Please excuse any typographical errors that have not been edited out.     Electronically signed by: Geni LITTIE Sprang, MD

## 2023-12-31 NOTE — Patient Instructions (Signed)
 You can try the pill under the tongue if the blood pressure is >115/60     Send me a message with the blood pressure numbers in 2 weeks by mychart

## 2024-01-06 ENCOUNTER — Encounter (HOSPITAL_BASED_OUTPATIENT_CLINIC_OR_DEPARTMENT_OTHER): Payer: Self-pay | Admitting: Ophthalmology

## 2024-01-06 ENCOUNTER — Encounter (HOSPITAL_BASED_OUTPATIENT_CLINIC_OR_DEPARTMENT_OTHER): Payer: Self-pay | Admitting: Cardiovascular Disease

## 2024-01-10 ENCOUNTER — Encounter (HOSPITAL_BASED_OUTPATIENT_CLINIC_OR_DEPARTMENT_OTHER): Payer: Self-pay

## 2024-01-11 ENCOUNTER — Emergency Department (HOSPITAL_BASED_OUTPATIENT_CLINIC_OR_DEPARTMENT_OTHER)

## 2024-01-11 ENCOUNTER — Other Ambulatory Visit: Payer: Self-pay

## 2024-01-11 ENCOUNTER — Emergency Department
Admission: EM | Admit: 2024-01-11 | Discharge: 2024-01-11 | Disposition: A | Discharge status: Home / Self Care | Attending: Emergency Medicine | Admitting: Emergency Medicine

## 2024-01-11 ENCOUNTER — Ambulatory Visit

## 2024-01-11 DIAGNOSIS — E669 Obesity, unspecified: Secondary | ICD-10-CM | POA: Insufficient documentation

## 2024-01-11 DIAGNOSIS — F411 Generalized anxiety disorder: Secondary | ICD-10-CM | POA: Diagnosis present

## 2024-01-11 DIAGNOSIS — F410 Panic disorder [episodic paroxysmal anxiety] without agoraphobia: Secondary | ICD-10-CM | POA: Diagnosis present

## 2024-01-11 DIAGNOSIS — F6389 Other impulse disorders: Secondary | ICD-10-CM | POA: Insufficient documentation

## 2024-01-11 DIAGNOSIS — F321 Major depressive disorder, single episode, moderate: Secondary | ICD-10-CM | POA: Diagnosis present

## 2024-01-11 DIAGNOSIS — M545 Low back pain, unspecified: Secondary | ICD-10-CM | POA: Insufficient documentation

## 2024-01-11 DIAGNOSIS — R1013 Epigastric pain: Secondary | ICD-10-CM | POA: Diagnosis not present

## 2024-01-11 DIAGNOSIS — I880 Nonspecific mesenteric lymphadenitis: Secondary | ICD-10-CM | POA: Insufficient documentation

## 2024-01-11 DIAGNOSIS — K760 Fatty (change of) liver, not elsewhere classified: Secondary | ICD-10-CM | POA: Diagnosis not present

## 2024-01-11 LAB — CBC, PLATELET & DIFFERENTIAL
ABSOLUTE BASO COUNT: 0 TH/uL (ref 0.0–0.1)
ABSOLUTE EOSINOPHIL COUNT: 0.2 TH/uL (ref 0.0–0.8)
ABSOLUTE IMM GRAN COUNT: 0.03 TH/uL (ref 0.00–0.10)
ABSOLUTE LYMPH COUNT: 2.1 TH/uL (ref 0.6–5.9)
ABSOLUTE MONO COUNT: 0.4 TH/uL (ref 0.2–1.4)
ABSOLUTE NEUTROPHIL COUNT: 3.6 TH/uL (ref 1.6–8.3)
ABSOLUTE NRBC COUNT: 0 TH/uL (ref 0.0–0.0)
BASOPHIL %: 0.3 % (ref 0.0–1.2)
EOSINOPHIL %: 2.4 % (ref 0.0–7.0)
HEMATOCRIT: 33.6 % — ABNORMAL LOW (ref 34.1–44.9)
HEMOGLOBIN: 11.1 g/dL — ABNORMAL LOW (ref 11.2–15.7)
IMMATURE GRANULOCYTE %: 0.5 % (ref 0.0–1.0)
LYMPHOCYTE %: 33.9 % (ref 15.0–54.0)
MEAN CORP HGB CONC: 33 g/dL (ref 31.0–37.0)
MEAN CORPUSCULAR HGB: 28.4 pg (ref 26.0–34.0)
MEAN CORPUSCULAR VOL: 85.9 fl (ref 80.0–100.0)
MEAN PLATELET VOLUME: 9.5 fL (ref 8.7–12.5)
MONOCYTE %: 5.7 % (ref 4.0–13.0)
NEUTROPHIL %: 57.2 % (ref 40.0–75.0)
NRBC %: 0 % (ref 0.0–0.0)
PLATELET COUNT: 251 TH/uL (ref 150–400)
RBC DISTRIBUTION WIDTH STD DEV: 40.9 fL (ref 35.1–46.3)
RED BLOOD CELL COUNT: 3.91 M/uL (ref 3.90–5.20)
WHITE BLOOD CELL COUNT: 6.3 TH/uL (ref 4.0–11.0)

## 2024-01-11 LAB — BASIC METABOLIC PANEL
ANION GAP: 13 mmol/L (ref 10–22)
BUN (UREA NITROGEN): 21 mg/dL — ABNORMAL HIGH (ref 7–18)
CALCIUM: 9.1 mg/dL (ref 8.5–10.5)
CARBON DIOXIDE: 30 mmol/L (ref 21–32)
CHLORIDE: 97 mmol/L — ABNORMAL LOW (ref 98–107)
CREATININE: 1.6 mg/dL — ABNORMAL HIGH (ref 0.4–1.2)
ESTIMATED GLOMERULAR FILT RATE: 35 mL/min — ABNORMAL LOW (ref >60)
Glucose Random: 111 mg/dL (ref 74–160)
POTASSIUM: 3.6 mmol/L (ref 3.5–5.1)
SODIUM: 139 mmol/L (ref 136–145)

## 2024-01-11 LAB — HEPATIC FUNCTION PANEL
ALANINE AMINOTRANSFERASE: 22 U/L (ref 12–45)
ALBUMIN: 4.2 g/dL (ref 3.4–5.2)
ALKALINE PHOSPHATASE: 125 U/L — ABNORMAL HIGH (ref 45–117)
ASPARTATE AMINOTRANSFERASE: 23 U/L (ref 8–34)
BILIRUBIN DIRECT: 0.2 mg/dL (ref 0.0–0.2)
BILIRUBIN TOTAL: 0.4 mg/dL (ref 0.2–1.0)
INDIRECT BILIRUBIN: 0.2 mg/dL (ref 0.2–0.9)
TOTAL PROTEIN: 7.1 g/dL (ref 6.4–8.2)

## 2024-01-11 LAB — POC URINALYSIS
BILIRUBIN, URINE: NEGATIVE
GLUCOSE,URINE: NEGATIVE
KETONE, URINE: NEGATIVE
LEUKOCYTE ESTERASE: NEGATIVE
NITRITE, URINE: NEGATIVE
OCCULT BLOOD, URINE: NEGATIVE
PH URINE: 7.5 (ref 5.0–8.0)
PROTEIN, URINE: NEGATIVE
SPECIFIC GRAVITY, URINE: 1.015 (ref 1.003–1.030)
UROBILINOGEN URINE: 0.2 (ref 0.2–1.0)

## 2024-01-11 LAB — HOLD BLUE TOP TUBE

## 2024-01-11 LAB — LIPASE: LIPASE: 41 U/L (ref 13–60)

## 2024-01-11 LAB — TROPONIN T HS 1 HOUR
DELTA 1 HOUR TROPONIN T HS: 1 ng/L (ref 0–4)
TROPONIN T HS 1 HOUR RESULT: 15 ng/L — ABNORMAL HIGH (ref 0–10)

## 2024-01-11 LAB — TROPONIN T HS BASELINE: TROPONIN T HS BASELINE: 14 ng/L — ABNORMAL HIGH (ref 0–10)

## 2024-01-11 MED ORDER — LACTATED RINGERS IV BOLUS
500.0000 mL | Freq: Once | INTRAVENOUS | Status: AC
Start: 2024-01-11 — End: 2024-01-11
  Administered 2024-01-11: 500 mL via INTRAVENOUS

## 2024-01-11 MED ORDER — LIDOCAINE VISCOUS HCL 2 % MT SOLN
10.0000 mL | Freq: Once | OROMUCOSAL | Status: AC
Start: 2024-01-11 — End: 2024-01-11
  Administered 2024-01-11: 10 mL via OROMUCOSAL
  Filled 2024-01-11: qty 15

## 2024-01-11 MED ORDER — IOHEXOL 350 MG/ML IV SOLN
75.0000 mL | Freq: Once | INTRAVENOUS | Status: AC
Start: 2024-01-11 — End: 2024-01-11
  Administered 2024-01-11: 75 mL via INTRAVENOUS

## 2024-01-11 MED ORDER — ALUMINUM & MAGNESIUM HYDROXIDE 200-200 MG/5ML PO SUSP
30.0000 mL | Freq: Once | ORAL | Status: AC
Start: 2024-01-11 — End: 2024-01-11
  Administered 2024-01-11: 30 mL via ORAL
  Filled 2024-01-11: qty 30

## 2024-01-11 MED ORDER — NORMAL SALINE FLUSH 0.9 % IV SOLN
50.0000 mL | Freq: Once | INTRAVENOUS | Status: AC
Start: 2024-01-11 — End: 2024-01-11
  Administered 2024-01-11: 50 mL via INTRAVENOUS

## 2024-01-11 MED ORDER — LORAZEPAM 0.5 MG PO TABS
0.5000 mg | ORAL_TABLET | Freq: Every day | ORAL | 0 refills | Status: DC
Start: 2024-01-11 — End: 2024-02-15

## 2024-01-11 MED ORDER — ESCITALOPRAM OXALATE 10 MG PO TABS
10.0000 mg | ORAL_TABLET | Freq: Every day | ORAL | 2 refills | Status: DC
Start: 2024-01-11 — End: 2024-02-22

## 2024-01-11 NOTE — ED Provider Notes (Signed)
 HPI:  This 68 year old female patient presented to [facility] with chief complaint of Abdominal Pain and Back Pain      Triage Documentation       Cristobal Bare, RN 01/11/2024 11:30                 Pt complains of aBD AND LOWER BACK PAIN FOR 2 WEEKS , WITH DIARRHEA AND VOMITING . PAIN 10 /10 . TOOK TYLENOL  AT 7 AM WITHOUT RELIEF . the patient REPORTS AT TIMES MAKES IT HARD TO BREATH . PT ALSO COMPLAINS OF A SORE AND SALTY TASTE IN MOUTH            History was provided with the aid of a [place] interpreter.   History provided by the patient and her daughters at bedside.   This is a 68 year old female with a significant medical history of hypertension, CKD 3, hyperlipidemia, obesity, paroxysmal atrial fibrillation on Eliquis  who presents to the Emergency Department with abdominal pain.  Daughters report that the patient has been complaining of bilateral lower back pain for the last few weeks.  However, since yesterday has been complaining of abdominal pain.  States it is diffuse, but worse in the epigastric region.  Associated with a sour taste in her mouth, as well as things tasting saltier than they should.  Reports nausea with one episodes of vomiting yesterday.  Intermittent diarrhea, which is chronic for her.  No constipation.  No urinary symptoms.  No fever.  No falls or trauma.  Patient has had some dyspnea on exertion, with ADLs, which was noted as well at her outpatient cardiology note.  Denies any chest pain today.      Chart Review:   Cardiology clinic note December 31, 2023  Low burden of [facility] and PVCs on Holter  Diagnosed with paroxysmal A-fib in June, started on metoprolol  blood thinner    Echo January 19, 2023  No regional wall motion abnormalities, grade 2 diastolic dysfunction    Cardiac catheterization MPI 12/22/2023  Severe codominant RCA and distal left circumflex bifurcation disease.  (LAD 60%, Lcx 70%, OM 70%, RCA ostail 90%, RCA 70%)   Decision at time of catheterization to try  and medical treatment prior to stenting    Past Medical History:  Past Medical History:  09/11/2018: Chronic kidney disease, stage III (moderate) (HCC)  08/14/2019: COVID-19      Comment:  Risk category:   high  Date of symptom onset: 08/12/19                 Tested? Positive  Risk factors: CKD, BMI > 30, HTN                 Clinical Course First date of symptoms:  08/12/19                 08/15/19: (DOI 3): Pt states she is feeling well. Slight                ST and sinus congestion. No cough, fever, SOB, DOE or                edema. F/U in 3 days.  08/18/2019: DOI 7- Feeling better                over all, stable vitals, CM f/u in 2 days. 08/20/19 DOI 9-               stable  sx; o2 ex  11/16/2023: Gastritis without bleeding  No date: History of hypertension  No date: HTN (hypertension)  01/05/2018: Mild episode of recurrent major depressive disorder (HCC)  02/20/2021: Osteopenia  11/29/2017: Panic disorder  No date: Wears eyeglasses  Past Surgical History:   Past Surgical History:  No date: CATARACT REMOVAL INSERTION OF LENS; Right  10/2016: HEMORRHOID SURGERY  No date: OB ANTEPARTUM CARE CESAREAN DLVR & POSTPARTUM  Allergies:  Review of Patient's Allergies indicates:   Pollen extract          Cough      Physical Exam:  ED Triage Vitals [01/11/24 1125]   ED Triage Vitals Brief Group      Temp 97.5 F      Pulse 71      Resp 18      BP 132/68      SpO2 97 %      Pain Score 10        General: Patient is awake and alert, resting comfortably in no acute distress  Head: Normocephalic and atraumatic  Respiratory: No respiratory distress, clear to auscultation  Cardiovascular: Regular rate and rhythm, no murmurs, 2+ radial and DP pulses bilaterally  GI: abdomen is nondistended, soft, epigastric abdominal tenderness to palpation  Extremities: Normal range of motion  Neuro: The patient is alert and oriented to person, place, and time.   Psych: Calm, cooperative, normal affect      Medications Given in the ED:    Medications    lidocaine  (XYLOCAINE ) 2 % viscous solution 10 mL (10 mLs Mouth/Throat Given 01/11/24 1237)   aluminum -magnesium  hydroxide (MAALOX) 200-200 mg/5 mL suspension 30 mL (30 mLs Oral Given 01/11/24 1237)   lactated ringers  IV bolus 500 mL (500 mLs Intravenous New Bag 01/11/24 1415)   iohexol  (OMNIPAQUE ) 350 MG/ML injection 75 mL (75 mLs Intravenous Given 01/11/24 1527)   sodium chloride  0.9 % flush 50 mL (50 mLs Intravenous Given 01/11/24 1527)       Results:     Labs Reviewed   CBC, PLATELET & DIFFERENTIAL - Abnormal; Notable for the following components:       Result Value    HEMOGLOBIN 11.1 (*)     HEMATOCRIT 33.6 (*)     All other components within normal limits   BASIC METABOLIC PANEL - Abnormal; Notable for the following components:    CHLORIDE 97 (*)     BUN (UREA NITROGEN) 21 (*)     CREATININE 1.6 (*)     ESTIMATED GLOMERULAR FILT RATE 35 (*)     All other components within normal limits   HEPATIC FUNCTION PANEL - Abnormal; Notable for the following components:    ALKALINE PHOSPHATASE 125 (*)     All other components within normal limits   TROPONIN T HS BASELINE - Abnormal; Notable for the following components:    TROPONIN T HS BASELINE 14 (*)     All other components within normal limits   TROPONIN T HS 1 HOUR - Abnormal; Notable for the following components:    TROPONIN T HS 1 HOUR RESULT 15 (*)     All other components within normal limits   LIPASE   HOLD BLUE TOP TUBE   POC URINALYSIS     CT Abdomen & Pelvis W IV Contrast   Final Result        Shotty lymph nodes in the mesenteric fat can indicate mesenteric lymphadenitis        Hepatic steatosis  No acute process in the abdomen or pelvis        Reviewed and Electronically Signed By: Slater Lute, MD    Signed Date and Time: 01/11/2024 3:54 PM                   While in the ED patient received:     Current Discharge Medication List        Patient Vitals for the past 24 hrs:   BP Temp Pulse Resp SpO2   01/11/24 1254 149/71 -- -- -- 94 %   01/11/24 1251 -- -- 69  -- 96 %   01/11/24 1248 149/71 -- -- -- 95 %   01/11/24 1125 132/68 97.5 F 71 18 97 %       ED Course and Medical Decision-making:    The patient is a 68 year old female with above history who presents to the Emergency Department with diffuse abdominal pain, worse in the epigastric region, associate with lower back pain    Patient has a complex past medical history including CAD, paroxysmal A-fib, on Eliquis , etc.    Differential diagnosis is broad in this patient including gastritis, peptic ulcer disease, pancreatitis, cholelithiasis, cholecystitis, ACS, etc.  Vascular abnormality such as aortic aneurysm and aortic dissection seem less likely in this patient with normal blood pressure, equal pulses x 4, and without an ill appearance.    Plan for evaluation with labs including LFTs, lipase, serial troponins, ECG, CT abdomen pelvis.  Will treat symptomatically GI cocktail while awaiting results    ED Course as of 01/11/24 1618   Tue Jan 11, 2024   1322 Creatinine similar to previous value   1617 Patient and her daughters updated at bedside with the aid of a [place] interpreter.  Understand finding of mesenteric adenitis.  Will continue home medications, and Tylenol  as needed, will follow-up with primary care team       Patient informed at bedside of their likely diagnosis.  Expectant management and return precautions were reviewed with them at bedside prior to discharge.     Disposition: Discharge    Patient Condition: Stable     Initial Impression:  Mesenteric adenitis      Rosaline Majestic, MD  [facility]  Attending Physician  Emergency Department      This Emergency Department patient encounter note was created using voice-recognition software and in real time during the ED visit. Please excuse any typographical errors that have not been edited out.

## 2024-01-11 NOTE — ED Triage Note (Signed)
 Pt complains of aBD AND LOWER BACK PAIN FOR 2 WEEKS , WITH DIARRHEA AND VOMITING . PAIN 10 /10 . TOOK TYLENOL  AT 7 AM WITHOUT RELIEF . the patient REPORTS AT TIMES MAKES IT HARD TO BREATH . PT ALSO COMPLAINS OF A SORE AND SALTY TASTE IN MOUTH

## 2024-01-11 NOTE — Narrator Note (Signed)
Pt ambulated to and from restroom without difficulty at this time. Upon return to room pt is now resting quietly on stretcher. Visible chest rise and fall noted, RR even and unlabored at this time.

## 2024-01-11 NOTE — Narrator Note (Signed)
 Discharge paperwork and medications reviewed with pt at this time. Pt verbalized understanding of D/C instructions, denies any further questions at this time. Vital signs obtained prior to discharge. VSS, visible chest rise and fall noted, RR even and unlabored. Pt is AOx4, in no apparent distress at time of departure.

## 2024-01-11 NOTE — Discharge Instructions (Signed)
 You were seen in the ER with abdominal pain and back pain    You had a urine test, blood test, an EKG, and a CAT scan of your abdomen that did not show concerning findings    There was some inflamed lymph nodes in your stomach, known as mesenteric adenitis, which may be the cause of your symptoms.  Please continue to rest, follow a bland diet, and take acetaminophen  as needed for your symptoms.    Please follow-up with your primary care team    Return to the ER with any new or concerning symptoms

## 2024-01-11 NOTE — Progress Notes (Signed)
 ADULT OUTPATIENT PSYCHIATRY (OPD)  PSYCHOPHARM FOLLOW UP    BH TREATMENT TEAM:  No data was found    LANGUAGE OF CARE:    [place] ([place]      LANGUAGE NEEDS MET:    Provider/Staff Proficient in Patient's Language (not English)    CHIEF COMPLAINT: I'm in so much pain.    INTERVAL HISTORY:   2 daughters present    It's been 3 mo X last appt despite f/u rec'd for 1 mo.  Had to go to cardiology appointments so had to cancel psychiatry appointments. Likewise for geri psych.    One daughter reports that patient was doing better until mom started having SOB 2 mo ago and everything spiraled down w/her cardiovascular health and her mood. Had had a work up and has had a cath. Needs a surgery but can't right now.    Pt cries that she's afraid to die. Cried this morning d/t being in pain-hips, back and abdominal.    Appetite is decreased d/t her mouth being very bitter. Wonders what it is.    Sleeping poorly d/t worries. Very exhausted.    OBJECTIVE DATA:  CURRENT MEDICATIONS:    Current Outpatient Medications   Medication Sig    apixaban  (ELIQUIS ) 5 MG po tablet Take 1 tablet by mouth in the morning and 1 tablet before bedtime.    amlodipine -valsartan  (EXFORGE ) 10-320 MG per tablet Take 1 tablet by mouth daily    torsemide  (DEMADEX ) 10 MG tablet Take 2 tablets by mouth daily    metoprolol  (TOPROL -XL) 50 MG 24 hr tablet Take 1 tablet by mouth daily    budesonide -formoterol  (SYMBICORT ,BREYNA ) 160-4.5 MCG/ACT inhaler Inhale 2 puffs into the lungs in the morning and 2 puffs before bedtime. Use 1 puff prn during the day. Rinse mouth after use.    famotidine  (PEPCID ) 40 MG tablet Take 1 tablet by mouth at bedtime    levothyroxine  (SYNTHROID ) 50 MCG tablet Take 1 tablet by mouth every morning before breakfast    ferrous sulfate  325 (65 FE) MG tablet Take 1 tablet by mouth every other day    nitroGLYCERIN  (NITROSTAT ) 0.4 MG sublingual tablet Place 1 tablet under the tongue every 5 (five) minutes as needed for Chest pain     meclizine  (ANTIVERT ) 25 MG TABS Take 1 tablet by mouth every 8 (eight) hours as needed    omeprazole  (PRILOSEC) 40 MG capsule Take 1 capsule by mouth daily    calcium  citrate-citamin D (CALCIUM  CITRATE +) 315-5 MG-MCG tablet Take 1 tablet by mouth in the morning and 1 tablet before bedtime.    atorvastatin  (LIPITOR) 80 MG tablet Take 1 tablet by mouth in the morning.    escitalopram  (LEXAPRO ) 10 MG tablet Take 1 tablet by mouth daily With breakfast    LORazepam  (ATIVAN ) 0.5 MG tablet Take 1 tablet by mouth daily    ezetimibe  (ZETIA ) 10 MG tablet Take 1 tablet by mouth daily (Patient not taking: Reported on 01/11/2024)    travoprost , BAK Free, (TRAVATAN  Z) 0.004 % ophthalmic solution Place 1 drop into both eyes nightly (Patient not taking: Reported on 01/11/2024)    cholecalciferol  (VITAMIN D3) 2000 UNIT tablet Take 1 tablet by mouth in the morning and 1 tablet before bedtime. (Patient not taking: Reported on 09/14/2023)    OTHER MEDICATION Use 1 each As Directed daily Blood Pressure Monitoring Kit   Arm Circumference LARGE  For use as directed: Please Dispense AMA validated cuff see [email or web address] (such as [employer])  DX: hypertension  ICD10: I10    Blood Pressure KIT Use 1 kit As Directed daily before breakfast Regular ICD i10     No current facility-administered medications for this visit.         VITAL SIGNS:   There were no vitals filed for this visit.    LABS:    WHITE BLOOD CELL COUNT (TH/uL)   Date Value   12/10/2023 6.6     RED BLOOD CELL COUNT (M/uL)   Date Value   12/10/2023 3.86 (L)     HEMOGLOBIN (g/dL)   Date Value   93/79/7974 11.0 (L)     No results found for: IDEALHCT  HEMATOCRIT (%)   Date Value   12/10/2023 33.3 (L)     No results found for: HCTDIFF  MEAN CORPUSCULAR VOL (fl)   Date Value   12/10/2023 86.3     MEAN CORPUSCULAR HGB (pg)   Date Value   12/10/2023 28.5     MEAN CORP HGB CONC (g/dL)   Date Value   93/79/7974 33.0     No results found for: RDW  PLATELET COUNT (TH/uL)   Date  Value   12/10/2023 279     MEAN PLATELET VOLUME (fL)   Date Value   12/10/2023 11.0     NEUTROPHIL % (%)   Date Value   10/20/2023 64.3     LYMPHOCYTE % (%)   Date Value   10/20/2023 27.6     MONOCYTE % (%)   Date Value   10/20/2023 5.8     EOSINOPHIL % (%)   Date Value   10/20/2023 1.5     BASOPHIL % (%)   Date Value   10/20/2023 0.4         ALBUMIN (g/dL)   Date Value   93/79/7974 4.3     ALKALINE PHOSPHATASE (U/L)   Date Value   12/10/2023 137 (H)     ALANINE AMINOTRANSFERASE (U/L)   Date Value   12/10/2023 17     ASPARTATE AMINOTRANSFERASE (U/L)   Date Value   12/10/2023 19     Glucose Random (mg/dL)   Date Value   93/79/7974 102     BUN (UREA NITROGEN) (mg/dL)   Date Value   93/79/7974 25 (H)     CALCIUM  (mg/dL)   Date Value   93/79/7974 9.4     CHLORIDE (mmol/L)   Date Value   12/10/2023 98     CARBON DIOXIDE (mmol/L)   Date Value   12/10/2023 29     CREATININE (mg/dL)   Date Value   93/79/7974 1.5 (H)     POTASSIUM (mmol/L)   Date Value   12/10/2023 3.6     SODIUM (mmol/L)   Date Value   12/10/2023 140     BILIRUBIN TOTAL (mg/dL)   Date Value   93/79/7974 0.4     TOTAL PROTEIN (g/dL)   Date Value   93/79/7974 7.4          THYROID  SCREEN TSH REFLEX FT4 (uIU/mL)   Date Value   10/20/2023 3.430     Cholesterol (mg/dL)   Date Value   93/79/7974 132   07/14/2021 215   06/13/2020 215     LOW DENSITY LIPOPROTEIN DIRECT (mg/dL)   Date Value   93/79/7974 80   07/14/2021 153   06/13/2020 141     HIGH DENSITY LIPOPROTEIN (mg/dL)   Date Value   93/79/7974 39 (L)   07/14/2021 44  06/13/2020 43     TRIGLYCERIDES (mg/dL)   Date Value   93/79/7974 140   06/13/2020 148   04/10/2019 169 (H)         HEMOGLOBIN A1C (%)   Date Value   12/03/2022 6.9 (H)   05/25/2022 6.3 (H)   02/24/2022 6.4 (H)     POC HEMOGLOBIN A1C (%)   Date Value   10/20/2023 6.5 (H)          RPR QUAL (no units)   Date Value   07/20/2019 Non Reactive     RPR QUALITATIVE (no units)   Date Value   01/24/2020 NON-REACTIVE                Pregnancy/Birth Control  (for female patients): post menopausal    CURRENT TREATMENT/CONTACT INFO FOR OTHER AGENCIES AND MENTAL HEALTH PROVIDERS (if applicable):     Contact/Community Support    No documentation.                   COLUMBIARISK ASSESSMENTS:     Suicide:  [facility] OP LAST CONTACT      Flowsheet Row BH Office Visit from 01/11/2024 in [facility]   Have you wished you were dead or wished you could go to sleep and not wake up? No   Have you actually had any thoughts of killing yourself?  No   Have you done anything, started to do anything, or prepared to do anything to end your life? No   C-SSRS OP Last Contact Screener Risk Score No Risk           C-SSRS Risk Assessment    No documentation.                      VIOLENCE:    Violence/Abuse Risk    No documentation.                   ADDITIONAL SCREENINGS:       09/14/2023 12/03/2022 11/06/2021   PHQ-9 Screening   Patient refused PHQ-9   No    Anhedonia 1 3 1      0     Low mood/hopelessness 1 3 1     0    Sleep disturbance 1 1 0   Fatigue 1 1 1    Appetite disturbance 0 0 0   Low self-worth 0 0 0   Impaired concentration 0 0 0   Psychomotor retardation/agitation 0 0 0   Suicidal ideation 0 0 0   PHQ-9 TOTAL 4  8 3     Problem difficulty   Not difficult at all       Patient-reported    Proxy-reported    Data saved with a previous flowsheet row definition    Multiple values from one day are sorted in reverse-chronological order            09/14/2023 12/03/2022 11/06/2021   GAD-7 Screening   Nervousness 1 3 1    1     Uncontrollable worry 1 3 0    0    Excessive worry 1     Trouble relaxing 1     Restlessness 0     Irritability 1     Fears of future 1     Difficulty functioning Not difficult at all     GAD-7 Score 6          Patient-reported    Proxy-reported    Multiple values from one  day are sorted in reverse-chronological order           09/14/2023 12/03/2022 11/06/2021   ALCOHOL/DRUG SCREENING   Frequency of 4+ drinks/day past year (Over 65)  0 0    0    Frequency of drug use  past year 0 0 0    0        Proxy-reported    Multiple values from one day are sorted in reverse-chronological order           11/17/2017   ALCOHOL (AUDIT) Screening   Frequency of 4+ drinks/day 0        Data saved with a previous flowsheet row definition         09/14/2023 12/03/2022 11/06/2021   DRUG (DAST) Screening   Drug use frequency 0 0 0    0        Proxy-reported    Multiple values from one day are sorted in reverse-chronological order         12/03/2022   SDOH Screening   Patient willing to answer questions Yes   Worry food will run out Never   Food won't last Never   Threatend to shut utilities No   Trouble getting transportation No   Living situation today I have a steady place to live.   Internet access Yes, on a computer,Yes, on my phone   Experienced violence No   OK for [place] to reach out No   Referral to community programs No       REVIEW OF SYSTEMS:        MENTAL STATUS EXAMINATION:   Mental Status Exam - 01/11/24 1030          Mental Status Exam     General Appearance Clean;Dressed appropriately;Dressed inappropriately to season     Behavior Cooperative;Good eye contact     Level of Consciousness Alert     Orientation Level Grossly intact     Attention/Concentration Fluctuating     Mannerisms/Movements No abnormal mannerisms/movements     Speech Quality and Rate Profuse     Speech Clarity Clear     Speech Tone Loud     Vocabulary/Fund of Knowledge WNL     Memory Grossly intact     Thought Process & Associations Perseveration;Tangential     Dissociative Symptoms None     Thought Content Preoccupations     Hallucinations None     Suicidal Thoughts None     Homicidal Thoughts None     Mood Anxious;Depressed/Sad     Affect Congruent with mood     Judgment Fair     Insight Fair                     RISK ASSESSMENTS:       Violence: low (1)     Addiction: low (1)     BIO/PSYCHO/SOCIAL AND RISK FORMULATION(S): 68 yr old cis gendered married [place] female, mother of 5 adult children, grand mother, unemployed X  19 yrs, w/hx of trauma, depression, panic disorder, no IPLOC or SU, fam hx of depression, anxiety, transfer from provider who left [place] 9 mo ago, 2 daughters present today contribute to history, endorses anhedonia, irritability, body pain, dizziness, complete sleep dysregulation d/t playing video games all night long and sleeping in the day, good appetite, no exercise, no interests. Requests increase in clonazepam  d/t staying alone all day and feeling anxious.     Prior med trials: duloxetine  X 7 mo, elavil ,  clonazepam  X age 4 ( 46 yrs), gabapentin , sertraline , trazodone , venlafaxine , fluoxetine- in [place], helped     ASSESSMENT:  Ruminating on dying. Labile mood today.    One daughter reports that patient was doing better until mom started having SOB 2 mo ago and everything spiraled down w/her cardiovascular health and her mood. Had had a work up and has had a cath. Needs a surgery but can't right now.    Appetite is decreased d/t her mouth being very bitter.  Sleeping poorly d/t worries.     DIAGNOSES:     Moderate major depression (HCC)  (primary encounter diagnosis)   SNOMED CT(R): MODERATE MAJOR DEPRESSION     Panic disorder   SNOMED CT(R): PANIC DISORDER     Gaming disorder   SNOMED CT(R): GAMING DISORDER     Obesity (BMI 30-39.9)   SNOMED CT(R): BODY MASS INDEX 30+ - OBESITY     GAD (generalized anxiety disorder)   SNOMED CT(R): GENERALIZED ANXIETY DISORDER      PLAN:   Continue esctilopram 10 mg with breakfast for mood.  Continue  lorazepam  0.5 mg daily in the morning for anxiety.  Trial gabapentin  for sleep and pain at night. NP will consult w/ Dr. Ernst to collaborate on safety of this approach.  Encouraged walking as much as she's able.  Keep appt w/ [facility] next week.      REVIEWING TODAY'S VISIT:             INFORMED CONSENT - for any new medication:   Patient was informed of the potential risks and benefits of the treatment, including the option not to treat, and appeared to understand and agreed  to comply. Discussion included the following key points: gabapentin .     COUNSELING AND COORDINATION OF CARE PROVIDED:   I spent a total of 47 minutes on this visit on the date of service (total time includes all activities performed on the date of service)    CARE PLAN/ EPISODES:  No linked episodes             Reena HERO. [place], APRN

## 2024-01-12 ENCOUNTER — Other Ambulatory Visit (LOCAL_COMMUNITY_HEALTH_CENTER): Payer: Self-pay

## 2024-01-12 DIAGNOSIS — F321 Major depressive disorder, single episode, moderate: Secondary | ICD-10-CM

## 2024-01-12 MED ORDER — GABAPENTIN 100 MG PO CAPS
100.0000 mg | ORAL_CAPSULE | Freq: Every evening | ORAL | 0 refills | Status: DC
Start: 2024-01-12 — End: 2024-03-29

## 2024-01-13 ENCOUNTER — Ambulatory Visit (HOSPITAL_BASED_OUTPATIENT_CLINIC_OR_DEPARTMENT_OTHER): Admitting: Internal Medicine

## 2024-01-14 NOTE — Progress Notes (Unsigned)
 GERIATRIC PSYCHIATRY CONSULT NOTE - OPD    Language of Care Documentation    Language of Care: [place]  Language Needs Met By: Telephone Interpreter  Intepreter ID: Sara END BY:    Arbutus Mems, APRN  [address]  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 is a 68 year old lady with PPH of MDD, panic disorder, gaming disorder, GAD, and PMH of Patient Active Problem List:     Essential hypertension     Labyrinthitis     Panic disorder     Hyperlipidemia     Mild episode of recurrent major depressive disorder (HCC)     Allergic rhinitis     Chronic pain of both shoulders     Stage 3a chronic kidney disease     Anxiety state     Psychophysiological insomnia     Secondary hyperparathyroidism (HCC)     Osteopenia     Carpal tunnel syndrome of right wrist     Fibromyalgia     Hair loss     Vitamin D  deficiency     Obesity (BMI 30-39.9)     Bilateral renal artery stenosis (HCC)     DOE (dyspnea on exertion)     Recurrent epistaxis     Gastroesophageal reflux disease without esophagitis     Other specified hypothyroidism     Combined forms of age-related cataract of right eye     Cervical cancer screening     Prediabetes     Healthcare maintenance     Schistosomiasis     Intraoperative floppy iris syndrome (IFIS)     Combined forms of age-related cataract of left eye     Varicose veins of calf     Chronic bilateral low back pain without sciatica     Morbid obesity with BMI 40.0-44.9, adult (HCC)     Abnormal mammogram     Tubular adenoma of colon     Angina pectoris [facility])     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 incontinence     Gastritis without bleeding     Atrial fibrillation with RVR (HCC)   referred to geriatric psychiatry consult for above reason.    On chart review:  - 12/2017 Sara Melena APRN  ... history of chronic  anxiety with panic attacks and depression. The client has no prior history of psychiatric hospitalizations, mania, psychosis, safety issues, violence or substance abuse issues.... There is a strong familial predisposition to psychiatric problems since family history is positive for depression and anxiety. Psychiatric symptoms started in [place] in 1996 in the context of work related stress. They recently exacerbated due to immigration related problems and medication discontinuation. Pt has been in tx with Clonazepam  and Sertraline  since 2005... She is adamant about continuing with the same dose of Clonazepam .  - 06/2019 - 04/2022: Sara Snyder RNCS  Venlafaxine , clonazepam , trazodone  --> increased venlafaxine   - 12/2019 - 01/2020 neurology consult  For memory concerns  Memory difficulties arising in the context of severe depression (crying every day for the last 3, 4 years).  DDx includes depressive pseudodementia, genuine dementia (Alzheimer's or other), minimal cognitive impairment, etc  Ordered MRI brain Brain: ... There are moderate FLAIR hyperintense foci in the periventricular/subcortical white matter. Mild patchy FLAIR hyperintensity in descending  white matter tracts in brainstem. There is a small chronic white matter lacunar infarct adjacent to the posterior right lateral ventricle. 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 Ventricles and CSF spaces: Unremarkable  Assessed that memory difficulties are due to depression  - 09/2022 - 11/2022: Sara Andrew APRN  PCP planning to switch venlafaxine  to duloxetine  for pain  Recurrent Hx of dropping out  Terminated after seeing her x2, mood worsened with worsening medical comorbidities  - 08/2023 - now: Sara Faster APRN  Can't sleep at night so sleeps in the day. Stays up playing video games. Crys if the internet goes down and can't play video games... Memory difficulties. Hx of high BP X age 89... Daughters help her w/ her meds. Daughters report their mom is  very irritable. Anything will make her nervous and fight or cry. Reports their mom is difficult to handle.  Stopped duloxetine , started escitalopram , changed clonazepam  to lorazepam , referred to CM and therapy  Daughter reported pt became more animated after the med change  Referred to geri psych for therapy  Added gabapentin  100mg  qhs recently    Most Recent Weight Reading(s)  12/31/23 : 88.3 kg (194 lb 9.6 oz)  12/14/23 : 88.6 kg (195 lb 5.2 oz)  12/10/23 : 88.6 kg (195 lb 6.4 oz)  11/29/23 : 90.3 kg (199 lb)  10/26/23 : 89.4 kg (197 lb)    Today [name] reported:  - My memory is weak, forgets what she was going to say/what she was talking about in the middle of the conversation, cannot remember things well  - Concerned she is on lots of meds, daughter prepared a document color-coding her meds, but she has a hard time following the instructions and manages meds on her own, admits she misses meds at times, may be double-dosing, not using a pillbox or any device to help her with med adherence  - Lots of medical complaints and physical s/sx/concerns; heart issues - started to feel more anxious, having panic after she became aware of her cardiac condition, I can't walk, I'm afraid of dying  - Does not remember why her psych provider changed her benzo  - Frequently looks at her daughters for info  - Demonstrated emotional outbursts, dysregulation during this interview, getting to an argument with daughters  - Had a difficult time using a phone interpreter, going on and on with what she wanted to say and did not sem to read social cues    Collateral from 2 daughters Sara Snyder, :  - [place], living with her HCP/daughter Sara Snyder since then  - Daughters do all the house chores, IADLs; she became more dependent on IADLs since 10 years ago  - At baseline she is anxious, nervous, agitated  - Has been on medication for anxiety for a long time  - Currently she does not do anything  but care for her 2 grandchildren  - Overtime she is becoming more nervous, agitated, does not like to be left alone, very dependent on others, has a lot of medical issues like fibromyalgia, heart issues, fear of dying    Depression: low mood, + anhedonia/loss of interest, low appetite since she lost her taste (after COVID?), no notable weight change, chronically poor sleep, + loss of energy/fatigue, + feelings of worthlessness/guilt, subjectively impaired ability to think or concentrate, no psychomotor retardation, no psychomotor agitation,  denied thoughts of death           27-Jan-2024  9:58 AM 09/14/2023     9:50 AM 12/03/2022     4:17 PM   PHQ-9 TOTAL SCORE   Doc FlowSheet Total Score 18  4  8    MyChart Total Score   8       Patient-reported     Mania/Hypomania: no s/sx or Hx  Anxiety: reported persistently high anxiety especially when alone       01/18/2024     9:58 AM 09/14/2023     9:50 AM 02/16/2019    10:36 AM 11/17/2017    12:15 PM   GAD-7 Total   GAD-7 Score 20  6  11 21        Patient-reported      Panic: reported episodes of high anxiety, unclear if she fully meets panic criteria  OCD: obsessions - denied, compulsions - denied   PTSD: trauma history - reported to have childhood trauma, need further assessment  Eating disorder: denied  Psychosis: hallucinations - denied, delusions - denied  Substance Use:   Caffeine - some cups of coffee, sometimes drinks at 5PM   Alcohol - denied   Tobacco - denied   Cannabis - denied   Opioids - denied   Cocaine - denied   Stimulants - denied   Prescriptions - denied  Others - denied  Safety:    HI - denied   SI - denied  Medications:   Adherence - non-adherent, she is in charge of meds and sometimes forgets to take or possibly duplicate   Side effects - denied   PRN - NA   Past trials include:  Sertraline    Clonazepam   Trazodone   Venlafaxine   Duloxetine   Amitriptyline    Escitalopram   Gabapentin    Lorazepam    No trials on bupropion, antipsychotics, buspirone    Functional  Status:  ADL:   Feeding - independent  Bathing - independent  Dressing - independent  Toileting - independent  Transferring bed/chair - independent  Walking - independent  Stairs - independent  IADL: mostly dependent since 10 years ago  Shopping - dependent  Cooking - dependent  Managing finances - dependent  Managing medications - dependent  Using phone - independent  Laundry - dependent  Driving - dependent  Additional History:   Seizures: denied   Head injury: denied  Falls: denied   Incontinence: denied   Community Help/Services: none    PAST PSYCHIATRIC HISTORY:  Diagnostic History: PTSD, panic disorder, recurrent MDD, gaming disorder  History of Psychiatric Hospitalizations: denied  Outpatient Care: Technical sales engineer APRN   Hx of SI: denied  Hx of SIB: denied  Hx of SA: denied  Hx of HI: denied  Hx of violence: denied    CURRENT MEDICATIONS:    Current Outpatient Medications   Medication Sig    Bimatoprost  (LUMIGAN ) 0.01 % SOLN Place 1 drop into both eyes nightly    gabapentin  (NEURONTIN ) 100 MG capsule Take 1 capsule by mouth nightly    escitalopram  (LEXAPRO ) 10 MG tablet Take 1 tablet by mouth daily With breakfast    LORazepam  (ATIVAN ) 0.5 MG tablet Take 1 tablet by mouth daily    LORazepam  (ATIVAN ) 0.5 MG tablet Take 0.5 mg by mouth every 6 (six) hours as needed for Anxiety    apixaban  (ELIQUIS ) 5 MG po tablet Take 1 tablet by mouth in the morning and 1 tablet before bedtime.    amlodipine -valsartan  (EXFORGE ) 10-320 MG per tablet Take 1 tablet by mouth daily    ezetimibe  (ZETIA ) 10 MG tablet Take 1  tablet by mouth daily (Patient not taking: Reported on 01/11/2024)    torsemide  (DEMADEX ) 10 MG tablet Take 2 tablets by mouth daily    metoprolol  (TOPROL -XL) 50 MG 24 hr tablet Take 1 tablet by mouth daily    budesonide -formoterol  (SYMBICORT ,BREYNA ) 160-4.5 MCG/ACT inhaler Inhale 2 puffs into the lungs in the morning and 2 puffs before bedtime. Use 1 puff prn during the day. Rinse mouth after use.     famotidine  (PEPCID ) 40 MG tablet Take 1 tablet by mouth at bedtime    levothyroxine  (SYNTHROID ) 50 MCG tablet Take 1 tablet by mouth every morning before breakfast    ferrous sulfate  325 (65 FE) MG tablet Take 1 tablet by mouth every other day    nitroGLYCERIN  (NITROSTAT ) 0.4 MG sublingual tablet Place 1 tablet under the tongue every 5 (five) minutes as needed for Chest pain    meclizine  (ANTIVERT ) 25 MG TABS Take 1 tablet by mouth every 8 (eight) hours as needed    omeprazole  (PRILOSEC) 40 MG capsule Take 1 capsule by mouth daily    cholecalciferol  (VITAMIN D3) 2000 UNIT tablet Take 1 tablet by mouth in the morning and 1 tablet before bedtime. (Patient not taking: Reported on 09/14/2023)    calcium  citrate-citamin D (CALCIUM  CITRATE +) 315-5 MG-MCG tablet Take 1 tablet by mouth in the morning and 1 tablet before bedtime.    OTHER MEDICATION Use 1 each As Directed daily Blood Pressure Monitoring Kit   Arm Circumference LARGE  For use as directed: Please Dispense AMA validated cuff see [email or web address] (such as [employer])       DX: hypertension  ICD10: I10    Blood Pressure KIT Use 1 kit As Directed daily before breakfast Regular ICD i10    atorvastatin  (LIPITOR) 80 MG tablet Take 1 tablet by mouth in the morning.     No current facility-administered medications for this visit.         LANGUAGE OF CARE:    [place]      LANGUAGE NEEDS MET:    Telephone Interpreter    VITAL SIGNS:   There were no vitals filed for this visit.    LABS/IMAGING:    Pertinent labs were reviewed.   Pertinent imaging studies were reviewed.    CURRENT TREATMENT/CONTACT INFO FOR OTHER AGENCIES AND MENTAL HEALTH PROVIDERS (if applicable):     Contact/Community Support    No documentation.                   MEDICAL HISTORY:  Past Medical History:  09/11/2018: Chronic kidney disease, stage III (moderate) (HCC)  08/14/2019: COVID-19      Comment:  Risk category:   high  Date of symptom onset: 08/12/19                 Tested? Positive   Risk factors: CKD, BMI > 30, HTN                 Clinical Course First date of symptoms:  08/12/19                 08/15/19: (DOI 3): Pt states she is feeling well. Slight                ST and sinus congestion. No cough, fever, SOB, DOE or                edema. F/U in 3 days.  08/18/2019: DOI 7- Feeling better  over all, stable vitals, CM f/u in 2 days. 08/20/19 DOI 9-               stable sx; o2 ex  11/16/2023: Gastritis without bleeding  No date: History of hypertension  No date: HTN (hypertension)  01/05/2018: Mild episode of recurrent major depressive disorder (HCC)  02/20/2021: Osteopenia  11/29/2017: Panic disorder  No date: Wears eyeglasses     PROBLEM LIST:  Patient Active Problem List:     Essential hypertension     Labyrinthitis     Panic disorder     Hyperlipidemia     Mild episode of recurrent major depressive disorder (HCC)     Allergic rhinitis     Chronic pain of both shoulders     Stage 3a chronic kidney disease     Anxiety state     Psychophysiological insomnia     Secondary hyperparathyroidism (HCC)     Osteopenia     Carpal tunnel syndrome of right wrist     Fibromyalgia     Hair loss     Vitamin D  deficiency     Obesity (BMI 30-39.9)     Bilateral renal artery stenosis (HCC)     DOE (dyspnea on exertion)     Recurrent epistaxis     Gastroesophageal reflux disease without esophagitis     Other specified hypothyroidism     Combined forms of age-related cataract of right eye     Cervical cancer screening     Prediabetes     Healthcare maintenance     Schistosomiasis     Intraoperative floppy iris syndrome (IFIS)     Combined forms of age-related cataract of left eye     Varicose veins of calf     Chronic bilateral low back pain without sciatica     Morbid obesity with BMI 40.0-44.9, adult (HCC)     Abnormal mammogram     Tubular adenoma of colon     Angina pectoris [facility])     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 incontinence     Gastritis without bleeding     Atrial fibrillation with RVR (HCC)      BIOLOGICAL  FAMILY HISTORY/FAMILY CONSTELLATION:    Family history of dementia: none  Family history of suicide: no known Hx    PSYCHOSOCIAL:  Originally from [place], [place]. [place]. Has 4 daughters, close to them and they help her with daily activities. Lives with 2 of them and their family. 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 Stays home and take care of her grandchildren.    COLUMBIA RISK ASSESSMENTS:     Suicide:   C-SSRS OP Triage Screener - 01/18/24 1256          C-SSRS OP Triage Screener    Within the past month, have you wished you were dead or wished you could go to sleep and not wake up?  No     Within the past month, have you had any actual thoughts of killing yourself?  No     In your lifetime, have you ever done anything, started to do anything, or prepared to do anything to end your life? No     C-SSRS OP Triage Screener Risk Score No Risk     C-SSRS OP Triage Screener Risk Score 0                    C-SSRS Risk Assessment - 01/18/24  1256          C-SSRS Risk Assessment    Suicidal and Self-Injurious Behavior (Past 3 Months) Not applicable     Suicidal and Self-Injurious Behavior (Lifetime) Not applicable     Suicidal Ideation Check Most Severe in Past Month Not applicable     Activating Events (Recent) Recent loss(es) or other significant negative event(s) (legal, financial, relationship, etc.) (Describe)     Treatment History Non-compliant with treatment;Previous psychiatric diagnoses and treatments     Clinical Status (Recent) Chronic physical pain or other acute medical problem (HIV/AIDS, COPD, cancer, etc.)     Protective Factors (Recent) Identifies reasons for living;Responsibility to family or others, living with family;Supportive social network or family;Fear of death or dying due to pain and suffering     Any suicidal, self-injurious, or aggressive behaviors?  No                     VIOLENCE:    Violence/Abuse Risk - 01/18/24 1256          Violence Risk    History  of Violence? No     Current Attempt to Harm No     Homicidal Ideation? No     Homicidal Ideation With Intent No     Self Destructive Behaviors Denies     Self Inflicted Injury Denies     Aggression/Poor Impulse Control Denies                     PROTECTIVE FACTORS:   Protective Factors - 01/18/24 1256          Protective Factors    Protective Factors (Recent) Identifies reasons for living;Responsibility to family or others, living with family;Supportive social network or family;Fear of death or dying due to pain and suffering                     RISK ASSESSMENTS:     Violence: low (1)     Addiction: low (1)     REVIEW OF SYSTEMS:         MENTAL STATUS EXAMINATION:  Mental Status Exam   General Appearance: Clean;Dressed appropriately;Dressed inappropriately to season  Behavior: Cooperative;Good eye contact  Level of Consciousness: Alert  Orientation Level: Oriented to place;Oriented to situation;Oriented to person  Attention/Concentration: Consistent difficulty paying attention  Mannerisms/Movements: No abnormal mannerisms/movements  Speech Quality and Rate: Pressured  Speech Clarity: Clear  Speech Tone: Loud  Vocabulary/Fund of Knowledge: WNL  Memory: STM Impaired  Thought Process & Associations: Circumstantial;Tangential;Perseveration  Dissociative Symptoms: None  Thought Content: No abnormalities reported or observed  Hallucinations: None  Suicidal Thoughts: None;Future oriented;Reasons for not doing it  Homicidal Thoughts: None  Mood comment: depressed, sad, anxious  Affect: Reactive  Judgment:  (limited)  Insight: Poor    BIO/PSYCHO/SOCIAL AND RISK FORMULATION(S):     69 year old lady who presents with gradually declining cognition and function over years with worsening anxiety. On benzodiazepine for years for underlying anxiety, also reported to have PTSD with Hx of childhood trauma.     Biological: Hx of longstanding anxiety on benzodiazepines, presenting with increased anxiety and worsening cognition and  functions over the past years in the setting of medication non-adherence, multiple medical comorbidities, decreased mobility, and increased dependence on family for care. In addition to executive dysfunction she presented with notable STM deficits during this interview. Combined with collateral, overall concerning for neurodegenerative process which may be worsening her affective s/sx. Medication  non-adherence is also a serious concern, given she is on numerous medications that can affect her BP, serum glucose, also on multiple CNS suppressants that can affect her mental status/cause delirium, with possible resultant cognitive/functional impairment and psychiatric instability. Neither patient nor family mentioned about game addiction today, but it may be driven by disinhibition associated with neurodegenerative process. Overall, it seems most critical to support her medication adherence to improve her medical comorbidities that may be driving her psychiatric instability. I also recommend neuropsych testing for diagnostic clarification and to gain more insight into her cognitive/functional limitations which can guide psychopharm tx and to get a better sense of level/type of support she needs in the community for stabilization. While I am concerned about polypharmacy and explained risks, both patient and family were desperate to either increase anxiety medication or add a new agent for her high anxiety. A trial of low-dose gabapentin  seems reasonable, with a hope that it can assist further benzo taper, which would likely take a long time.     Psychological: Distress around medical comorbidities and associated fear of dying. Potential strains on relationships with family due to her increasing care needs.    Social: Stable housing. Not working. Well supported by family. Unclear if connected to ASAP, [facility].      Treatment goal(s): improvement in anxiety    Risk Assessment:  Static: low risk of harm to self (no personal  and family Hx, very future-oriented, fearful of death) and low risk of harm others/violence at baseline   Dyamic: low imminent risk of harm to self and low imminent risk of harm to others/violence  Addiction: low-mod risk     DIAGNOSES:    (R41.89) Cognitive impairment  (primary encounter diagnosis)  (F41.1) GAD (generalized anxiety disorder)  (F32.9) Chronic major depressive disorder    R/o MNCD/BPSD  R/o cluster C    RECOMMENDATIONS:   Biological:  Agree with a trial of gabapentin , which may also address some of her pain if neuropathic, with a goal of assisting further benzo taper; explained risks including polypharmacy, inc CNS suppression, risks of falls  Alternatively consider a trial of buspirone (though it may not help as she has been on benzo for years); may need to consider low-dose antipsychotics for severe anxiety with ruminations, agitation  As a first step, recommend neuropsych testing, sleep study for diagnostic clarification  Minimize anticholinergic meds that can worsen cognition  Medication interactions: omeprazole  can inc serum conc of escitalopram  and enhance toxic effect of escitalopram , monitor BP, CNS suppression, on multiple anticholinergics  EKG: most recent EKG shows QTc 451 HR 78 in 10/2023, repeat is pending  Labs: recommend checking TSH w reflex T4, vit D, B12, folate to r/o secondary mood/anxiety symptoms  Sleep study: recommend further evaluation to r/o non-psychiatric etiology of depressive symptoms, cognitive impairment, especially with her BMI  Agree with management of metabolic/vascular risk factors  Psychological:  Psychotherapy: defer for now  Neuropsychological testing: referred  Social:  Connected to [employer]: refer as needed  Connected to [employer]: defer for now  Legal:  Legal decision maker: invoke HCP - did not seem able to retain and manipulate relevant information to make informed decision due to cognitive impairment at this time  HCP:  Sara Snyder Halbert Everitt Janell Richardine, daughter  MOLST: needs one  Follow-Up:  With Sara Faster, APRN  Re-consult as needed  Monitoring:  Lipids:   Cholesterol (mg/dL)   Date Value   93/79/7974 132   07/14/2021 215  06/13/2020 215     LOW DENSITY LIPOPROTEIN DIRECT (mg/dL)   Date Value   93/79/7974 80   07/14/2021 153   06/13/2020 141     HIGH DENSITY LIPOPROTEIN (mg/dL)   Date Value   93/79/7974 39 (L)   07/14/2021 44   06/13/2020 43     TRIGLYCERIDES (mg/dL)   Date Value   93/79/7974 140   06/13/2020 148   04/10/2019 169 (H)      HbA1c:   HEMOGLOBIN A1C (%)   Date Value   12/03/2022 6.9 (H)   05/25/2022 6.3 (H)   02/24/2022 6.4 (H)     POC HEMOGLOBIN A1C (%)   Date Value   10/20/2023 6.5 (H)        AIMS Assessment:  Is Patient On Antipsychotics: No- AIMS not Indicated                                                          For any medication(s), patient was informed of the potential risks and benefits of the treatment, including the option not to treat, and appeared to understand and agreed to comply. Discussion included: purpose of medications including black-box warning when applicable, provided [facility] contact number [phone], disease process, how to reach EMS if feeling at risk of harming self or others.     I spent a total of 90 minutes on this visit on the date of service. Total time included all activities performed on the date of service.    Ernst Lown, MD

## 2024-01-17 ENCOUNTER — Other Ambulatory Visit (HOSPITAL_BASED_OUTPATIENT_CLINIC_OR_DEPARTMENT_OTHER): Payer: Self-pay

## 2024-01-17 MED ORDER — BIMATOPROST 0.01 % OP SOLN
1.0000 [drp] | Freq: Every evening | OPHTHALMIC | 11 refills | Status: AC
Start: 2024-01-17 — End: 2025-01-16

## 2024-01-18 ENCOUNTER — Ambulatory Visit

## 2024-01-18 ENCOUNTER — Other Ambulatory Visit: Payer: Self-pay

## 2024-01-18 DIAGNOSIS — F411 Generalized anxiety disorder: Secondary | ICD-10-CM | POA: Diagnosis present

## 2024-01-18 DIAGNOSIS — F329 Major depressive disorder, single episode, unspecified: Secondary | ICD-10-CM | POA: Diagnosis present

## 2024-01-18 DIAGNOSIS — R4189 Other symptoms and signs involving cognitive functions and awareness: Secondary | ICD-10-CM | POA: Insufficient documentation

## 2024-01-21 ENCOUNTER — Encounter (HOSPITAL_BASED_OUTPATIENT_CLINIC_OR_DEPARTMENT_OTHER): Payer: Self-pay | Admitting: Cardiovascular Disease

## 2024-01-31 ENCOUNTER — Other Ambulatory Visit: Payer: Self-pay

## 2024-01-31 ENCOUNTER — Ambulatory Visit: Attending: Pulmonary Disease | Admitting: Pulmonary Disease

## 2024-01-31 VITALS — BP 107/66 | HR 73 | Temp 97.4°F | Ht 59.0 in | Wt 195.0 lb

## 2024-01-31 DIAGNOSIS — I25119 Atherosclerotic heart disease of native coronary artery with unspecified angina pectoris: Secondary | ICD-10-CM | POA: Diagnosis present

## 2024-01-31 DIAGNOSIS — Z6841 Body Mass Index (BMI) 40.0 and over, adult: Secondary | ICD-10-CM | POA: Diagnosis present

## 2024-01-31 DIAGNOSIS — K219 Gastro-esophageal reflux disease without esophagitis: Secondary | ICD-10-CM | POA: Insufficient documentation

## 2024-01-31 DIAGNOSIS — Z9109 Other allergy status, other than to drugs and biological substances: Secondary | ICD-10-CM | POA: Insufficient documentation

## 2024-01-31 DIAGNOSIS — J4540 Moderate persistent asthma, uncomplicated: Secondary | ICD-10-CM | POA: Insufficient documentation

## 2024-01-31 DIAGNOSIS — R0602 Shortness of breath: Secondary | ICD-10-CM | POA: Insufficient documentation

## 2024-01-31 NOTE — Progress Notes (Unsigned)
 CARDIOLOGY CLINIC CONSULT NOTE   Date of visit: 02/01/2024  Language: [place] phone interpreter used     History of present illness:   68 year old female with HTN, CKD 3, RAS, low burden of PAC and PVCS on holter, HL, overweight/obesity     Prior saw Dr Risser      10/26/23    In 8/24 on vacation got HR reading as high as 150 a/w sxs of palps and chest pressure. Seems not having HR that fast since.   Having times w palps   epigastric discomfort sxs into chest, random. sometimes associated with chest discomfort, fatigue, and a sensation of fullness in the stomach. Stress or emotional upset can trigger these symptoms.  persistent discomfort in her stomach,fullness even without eating,  intermittent chest pain several times a day. cp not consistently related to physical activity or eating.  citalopram started approximately one month ago after discontinuing another medication that she believes exacerbated her symptoms.  takes omeprazole  for gastrointestinal symptoms.  --stress and event monitor    12/10/23  worsening sob and fatigue x1 yr since trip to [place]  in 8/24.  severe weakness and a rapid heartbeat exceeding 110 beats per minute   Dx PAF, started on metoprolol  and a blood thinner . Metoprolol  improved palpitations significantly but did not affect DOE and exertional fatigue.     --cath     12/31/23  See phone note 12/27/2023.  When I spoke with daughter did seem like some symptoms may be better on higher dose metoprolol .  Did increase torsemide  as recommended.  symptoms overall unchanged.    pain that starts in the stomach goes around to the back and up to the lower chest and causes fatigue. Seems to occur randomly has not gotten better or worse over time  blood pressure 105-115/60   rx nitroglycerin  for emergencies but has not used it   -- Stop chlorthalidone , increase amlodipine , add ezetimibe , can trial NTG    02/01/24  Was in the ER 01/11/2024 with back and abdominal discomfort.  Troponin was 14 and 15.   Creatinine 1.6 which is stable  At home bp 96/64, 105/63, 113/70, 104/59, low 91/59  Says first day of taking higher dose of amlodipine  had stomach that pain and shortness of breath lasting 1 day  Last Friday more significant episodes of dizzy, SOB, chest tight after showering.  Seems to say her BP was 12/8 when she was dizzy.  Took a while for her to feel better.  So could not sleep took an additional lorazepam  and then was able to sleep  Patient fearful about multiple things including trial of sublingual nitro  difficulty walking due to fatigue and pain that starts in her hip and radiates upwards, leading to chest discomfort. She experiences dizziness, especially when bending down or looking up  No syncope but does describe a fall   -- Trial of isosorbide , decrease amlodipine /valsartan  to half tab, echo 8/28.  If symptoms not improved refer for revascularization        Most Recent Pulse Reading(s)  02/01/24 : 78  01/31/24 : 73  01/11/24 : 70  12/31/23 : 78  12/21/23 : 87    Most Recent BP Reading(s)  02/01/24 : 140/56  01/31/24 : 107/66  01/11/24 : 137/86  12/31/23 : 135/66  12/21/23 : 135/70      Most Recent Weight Reading(s)  02/01/24 : 88.5 kg (195 lb 3.2 oz)  01/31/24 : 88.5 kg (195 lb)  12/31/23 : 88.3  kg (194 lb 9.6 oz)  12/14/23 : 88.6 kg (195 lb 5.2 oz)  12/10/23 : 88.6 kg (195 lb 6.4 oz)       Patient Active Problem List:     Essential hypertension     Labyrinthitis     Panic disorder     Hyperlipidemia     Mild episode of recurrent major depressive disorder (HCC)     Allergic rhinitis     Chronic pain of both shoulders     Stage 3a chronic kidney disease     Anxiety state     Psychophysiological insomnia     Secondary hyperparathyroidism (HCC)     Osteopenia     Carpal tunnel syndrome of right wrist     Fibromyalgia     Hair loss     Vitamin D  deficiency     Obesity (BMI 30-39.9)     Bilateral renal artery stenosis (HCC)     DOE (dyspnea on exertion)     Recurrent epistaxis     Gastroesophageal reflux  disease without esophagitis     Other specified hypothyroidism     Combined forms of age-related cataract of right eye     Cervical cancer screening     Prediabetes     Healthcare maintenance     Schistosomiasis     Intraoperative floppy iris syndrome (IFIS)     Combined forms of age-related cataract of left eye     Varicose veins of calf     Chronic bilateral low back pain without sciatica     Morbid obesity with BMI 40.0-44.9, adult (HCC)     Abnormal mammogram     Tubular adenoma of colon     Angina pectoris [facility])     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 incontinence     Gastritis without bleeding     Atrial fibrillation with RVR (HCC)      Allergies:  Review of Patient's Allergies indicates:   Pollen extract          Cough    Meds:  Current Outpatient Medications   Medication Instructions    amlodipine -valsartan  (EXFORGE ) 10-320 MG per tablet 1 tablet, Oral, DAILY    apixaban  (ELIQUIS ) 5 mg, Oral, 2 TIMES DAILY    atorvastatin  (LIPITOR) 80 mg, Oral, DAILY    Bimatoprost  (LUMIGAN ) 0.01 % SOLN 1 drop, Both Eyes, NIGHTLY    Blood Pressure KIT 1 kit, Does not apply, DAILY BEFORE BREAKFAST, Regular ICD i10    budesonide -formoterol  (SYMBICORT ,BREYNA ) 160-4.5 MCG/ACT inhaler 2 puffs, Inhalation, 2 TIMES DAILY, Use 1 puff prn during the day. Rinse mouth after use.    calcium  citrate-citamin D (CALCIUM  CITRATE +) 315-5 MG-MCG tablet 1 tablet, Oral, 2 TIMES DAILY    cholecalciferol  (VITAMIN D3) 2,000 Units, Oral, 2 TIMES DAILY    escitalopram  (LEXAPRO ) 10 mg, Oral, DAILY, With breakfast    ezetimibe  (ZETIA ) 10 mg, Oral, DAILY    famotidine  (PEPCID ) 40 mg, Oral, Nightly    ferrous sulfate  325 mg, Oral, EVERY OTHER DAY    gabapentin  (NEURONTIN ) 100 mg, Oral, NIGHTLY    levothyroxine  (SYNTHROID ) 50 mcg, Oral, EVERY MORNING BEFORE BREAKFAST    LORazepam  (ATIVAN ) 0.5 mg, Oral, DAILY    LORazepam  (ATIVAN ) 0.5 mg, EVERY 6 HOURS PRN    meclizine  (ANTIVERT ) 25 mg, Oral, EVERY 8 HOURS PRN    metoprolol  (TOPROL -XL) 50 mg, Oral, DAILY    nitroGLYCERIN   (NITROSTAT ) 0.4 mg, Sublingual, EVERY 5 MIN PRN    omeprazole  (PRILOSEC) 40 mg, Oral, DAILY  OTHER MEDICATION 1 each, Does not apply, DAILY, Blood Pressure Monitoring Kit   Arm Circumference LARGE  For use as directed: Please Dispense AMA validated cuff see [email or web address] (such as [employer])       DX: hypertension  ICD10: I10    torsemide  (DEMADEX ) 20 mg, Oral, DAILY       Social History:  mother of 5 and GM of 3   resides with her husband and one dau and one grandchild.   No tob, eoth, drugs    Family History:  Son - needs heart surgery, ?  CABG for cad, in [place]   Daughter -SVT ablation      ROS: All other systems reviewed were pertinently negative apart from the history of present illness.    PHYSICAL EXAM:   02/01/24  0912   BP: 140/56   Site: Left Arm   Position: Sitting   Cuff Size: Regular   Pulse: 78   Temp: 96.5 F (35.8 C)   TempSrc: Temporal   SpO2: 96%   Weight: 88.5 kg (195 lb 3.2 oz)         0 lb (0 kg)  Body mass index is 39.43 kg/m.  General: NAD, well developed, well nourished.  HEENT: Oral mucosa is moist, no icterus, no pallor noted.  NECK: No observable jugular venous distention,  .  CHEST: Clear to auscultation, no crackles or rhonchi. Nontender.  HEART:  No heaves or lifts. Regular rate and rhythm.  1/6 SEM.  No ectopy  ABDOMEN: NABS. Soft. Nontender and nondistended.   EXTREMITIES: Warm and well perfused. No edema. 2+ pulses.   NEURO: A&O X 3, moves all extremities. Grossly nonfocal.  PSYCHIATRIC: Mood and affect are appropriate. anxious      DATA:  LABS:   Lab Results   Component Value Date    NA 139 01/11/2024    K 3.6 01/11/2024    CL 97 (L) 01/11/2024    CO2 30 01/11/2024    BUN 21 (H) 01/11/2024    CREAT 1.6 (H) 01/11/2024    GLUCOSER 111 01/11/2024    LDL 80 12/10/2023    HDL 39 (L) 12/10/2023    TG 140 12/10/2023    ALT 22 01/11/2024    AST 23 01/11/2024    TSH 7.090 (H) 01/14/2021     NT-proBNP (pg/mL)   Date Value   12/10/2023 210 (H)   10/20/2023 182 (H)      EKG: (on my  personal review) 10/26/23 nsr, lvh in avl. Qrs 104. Minor st abnormalities lateral leads. Similar to prior.     Holter 03/17/2019  Sinus at 70 with range 52 to 100 BPM.   Approximately 600 APC's = 0.6% of all beats, including 15 couplets and 7 short relatively slow runs.   Approximately 600 VPC's = 0.6% of all beats, including 2 couplets but no runs.   No symptoms.     Holter 10/08/20  Sinus at 79 with range 54 to 121 BPM.   140 APC's including 4 coupletes and one 5-beat run at 130 BPM.    500 VPC's = 0.5% fo all beats, with no couplets or runs.   No symptoms.     Lexiscan  stress 02/26/21  Up to 1 mm of flat ST depression developed in the inferolateral leads after Lexiscan  infusion.  There was no concomitant chest pain   Noinfarct or ischemia.  Normal cardiac wall motion and ejection fraction    01/19/23  Right - The renal artery has  evidence of 60-99% stenosis. RI values are elevated indicating intrinsic disease. Kidney length is normal. Renal vein is patent.  Left - The renal artery has evidence of 60-99% stenosis. RI values are elevated indicating intrinsic disease. Kidney length is normal. Renal vein is patent.  Note: SMA has evidence of elevated velocities indicating greater than 70% stenosis.    Echo 01/19/23  1. LVEF 60% .  2.   no regional wall motion abnormalities.  3. grade II diastolic dysfunction.  4. Left Atrium: Moderately dilated.  5. Aortic Valve:  mild sclerosis; no stenosis.  mild aortic regurgitation.  6.  RVSP  30 mmHg which is normal.  7.  no pericardial effusion    Zio 5/25  12 days 20 hours of monitoring after artifact removed  - Predominant sinus rhythm.  Heart rate in sinus 55-122, average 72 bpm  - There were 320 runs of SVT.  The fastest SVT was 6 beats with a maximum rate of 194.  The longest SVT was 8.1 seconds with an average rate of 115 bpm.  Some SVT is atrial tachycardia with variable block  - Atrial fibrillation occurred 2% of the time with heart rates in atrial fibrillation 71-188, average  104.  The longest episode of A-fib was 23 minutes 11 seconds with an average heart rate of 120 bpm.  There were 212 runs of afib and 8 episodes that were 6 minutes or longer.   - Isolated PACs comprise 1.8% of beats.  PAC couplets and triplets were rare  - Isolated PVCs were infrequent.  PVC couplets were rare.  No ventricular runs  - There were no patient events    Stress 11/23/23  Lexiscan   Abnormal ecg with occasional PVCs  There is a small fixed defect of the anterior and anteroseptal wall. Breast artifact is also possible    Cardiac cath at [facility] 12/22/23  Severe co-dominant RCA and distal LCx bifurcation disease. Elevated LVEDP.   Patient with anterior/anteroaplical ischemia on stress and primary complaint of dyspnea. LVEDP is marginally elevated and has small branch disease in distal Lcx which may require bifurcation stenting and co-dominant RCA disease   LAD - mid to distal LAD 60%  LCX mid 70%, OM 2 70%  RCA ostial 90%, prox-mid RCA 70%    Assessment and Plan:  Atrial fibrillation   New dx PAF on zio 5/25. Sxs of palps sig improved w metoprolol . Afib was low burden 2% on zio. Started on eliquis .  TSH normal 4/25  In sinus 8/25   - Continue metoprolol  to 50 mg  -Continue Eliquis      CAD  Significant RCA and LCx disease but codominant.  Decision at the time of cath to try and med Rx first.  Not completely clear how much of DOE is angina versus CHF versus other. Have been attempting  aggressive med Rx.  If no improvement in symptoms would send for revascularization -no lasting improvement with torsemide  or increase in amlodipine .    -Will trial isosorbide  and await updated echo.  If symptoms continue would plan to send for revascularization given severity of symptoms    Meds -no aspirin for now on Eliquis   Angina -see above.  DM2-has had A1c >= 6.5 x 2.  A1c 6.5 in 4/25  Tob-no  Exercise-no dedicated exercise  Diet-patient says she avoids fatty foods  Weight-stable  Sleep-      HFpEF  BNP slightly elevated but may be  lower than expected given obesity but does have CKD.  Elevated LVEDP on cath  -Continue  torsemide  20 mg.  Labs stable when in ER    DOE/fatigue  Has also seen pulmonary with plan for chest CT, methacholine challenge and echo  - Plans as above.  Likely multifactorial  -echo 8/28    Hypertension  Goal <130/80  Blood pressure possibly slightly over controlled at home  - Cut amlodipine  and valsartan  tablets in half to prevent hypotension while adding isosorbide .    Chronic kidney disease, renal artery stenosis   Follows w renal.  not taking spironolactone . On ARB   Cr 1.3-1.5  Consider SGLT2 inhibitor in the future    Lipids  With CAD goal LDL <55-70.  LDL in the 80s on atorvastatin  80 mg. Added ezetimibe  7/25  -recheck in the future    Fibromyalgia  Significant musculoskeletal pain affecting mobility, particularly in knees, legs, and hips. Limits walking  Per other providers    Depression/anxiety  Does seem to have sig affect on sxs      Mild AR  Echo by 2027, earlier if indicated   -echo schedule 02/17/24     Dizziness   Today mentions dizziness and that she has been taking as needed meclizine .  Does seem to say there is a spinning component.  Not clearly related to blood pressure the way she is describing it as she says she was dizzy when she had an elevated blood pressure    Gastrointestinal symptoms (stomach pain, dyspepsia)  On famotidine .  Says taking magnesium  at home.    The patient verbally consented to an audio recording of their visit to assist with the completion of documentation. The patient is aware the recording is not retained after the visit is summarized.    I spent a total of 55 minutes on this visit on the date of service (total time includes all activities performed on the date of service such as chart review, time speaking with the patient, documenting and communicating with other providers)   Thank you for the privilege of involving me in this patient's care.  Please feel free to contact me if  you have any questions.  This patient encounter note was created using voice-recognition software and in real time. Please excuse any typographical errors that have not been edited out.     Electronically signed by: Geni LITTIE Sprang, MD

## 2024-01-31 NOTE — Progress Notes (Signed)
 PULMONARY CLINIC VISIT NOTE        CC: Wheezing    HPI:   Sara Snyder is a pleasant 68 yo with hx of allergic rhinitis, ?asthma, CKD, HTN who was referred for wheezing.      [place] telephone interpreter was used during the visit. Patient's 2 daughters were also present and helped with giving more history.    Patient was last seen in 11/2023. At that visit, she complained about chronic wheezing/dyspnea x 1 year. PFTs were normal. Plan was for methacholine challenge, chest CT and echocardiogram.     Since the last visit:   - did not get the CT, echo or methacholine challenge done yet, they are scheduled in a few weeks  - has been using symbicort  160/4.75mcg 2 puffs BID and cetrizine   - went to ER on 7/22 for abdominal pain, dx w/ mesenteric adenitis   - had a cardiac cath in 12/2023, showed significant disease     01/2024:  - symptoms have been ongoing   - she complains of abdominal pain, generalized aches and dyspnea on exertion  - she says she can't take a shower without feeling dyspneic at the end  - complaining of GERD symptoms and reflux, on omeprazole  that's been helping   - using symbicort  consistently but doesn't feel like it's helping  - no pets or carpets at home  - no smoking  - no hx of eczema   - does have environmental allergies     PMH:  Patient Active Problem List:     Essential hypertension     Labyrinthitis     Panic disorder     Hyperlipidemia     Mild episode of recurrent major depressive disorder (HCC)     Allergic rhinitis     Chronic pain of both shoulders     Stage 3a chronic kidney disease     Anxiety state     Psychophysiological insomnia     Secondary hyperparathyroidism (HCC)     Osteopenia     Carpal tunnel syndrome of right wrist     Fibromyalgia     Hair loss     Vitamin D  deficiency     Obesity (BMI 30-39.9)     Bilateral renal artery stenosis (HCC)     DOE (dyspnea on exertion)     Recurrent epistaxis     Gastroesophageal reflux disease without  esophagitis     Other specified hypothyroidism     Combined forms of age-related cataract of right eye     Cervical cancer screening     Prediabetes     Healthcare maintenance     Schistosomiasis     Intraoperative floppy iris syndrome (IFIS)     Combined forms of age-related cataract of left eye     Varicose veins of calf     Chronic bilateral low back pain without sciatica     Morbid obesity with BMI 40.0-44.9, adult (HCC)     Abnormal mammogram     Tubular adenoma of colon     Angina pectoris [facility])     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 incontinence     Gastritis without bleeding     Atrial fibrillation with RVR (HCC)      SH:  Social History     Socioeconomic History    Marital status: Married     Spouse name: Not on file    Number of children: Not on file    Years of education: Not on file  Highest education level: Not on file   Occupational History    Not on file   Tobacco Use    Smoking status: Former     Current packs/day: 0.00     Average packs/day: 2.0 packs/day for 0.3 years (0.7 ttl pk-yrs)     Types: Cigarettes     Start date: 19     Quit date: 10/27/1977     Years since quitting: 46.2    Smokeless tobacco: Never   Substance and Sexual Activity    Alcohol use: Not Currently    Drug use: Never    Sexual activity: Yes     Partners: Male   Other Topics Concern    Not on file   Social History Narrative    Originally from [place], [place]    History of childhood trauma    Lives with family   Social Drivers of Catering manager Strain: Not on file  Food Insecurity: Not on file  Transportation Needs: Not on file  Physical Activity: Not on file  Stress: Not on file  Social Connections: Not on file  Intimate Partner Violence: Not on file  Housing Stability: Not on file      Allergies:     Review of Patient's Allergies indicates:   Pollen extract          Cough     Medications:     Medication List      as of Oct 29, 2021 10:43 AM     You have not been prescribed any medications.         Vital Signs :   BP 107/66   Pulse 73    Temp 97.4 F (36.3 C) (Temporal)   Ht 4' 11 (1.499 m)   Wt 88.5 kg (195 lb)   SpO2 97%   BMI 39.39 kg/m       Physical Exam:   General appearance: alert, appears stated age, and cooperative  Lungs: clear to auscultation bilaterally  Heart:  regular rate and rhythm, S1, S2 normal, no murmur, click, rub or gallop  Extremities: extremities normal, atraumatic, no cyanosis or edema  Pulses: 2+ and symmetric  Skin: Skin color, texture, turgor normal. No rashes or lesions  Neurologic: Grossly normal        DATA:     Recent labs:  Lab Results   Component Value Date    NA 139 01/11/2024    K 3.6 01/11/2024    CL 97 (L) 01/11/2024    CO2 30 01/11/2024    BUN 21 (H) 01/11/2024    CREAT 1.6 (H) 01/11/2024    GLUCOSER 111 01/11/2024     Lab Results   Component Value Date    WBC 6.3 01/11/2024    HGB 11.1 (L) 01/11/2024    HCT 33.6 (L) 01/11/2024    PLTA 251 01/11/2024     PFT 02/2018:   Flow volume loops are normal, and unchanged with bronchodilator.  FEV1 is 2.02 L (98% of predicted), FVC is 2.53 L (97% predicted), and FEV1 to FVC ratio is normal at 80%. Lung volumes are all within normal limits, with the exception of a reduced expiratory reserve volume.  Diffusion capacity is calculated as being within normal limits (55% of predicted, and when adjusted for alveolar volume, 114% predicted). O2 saturation ranges from 96 to 98% with exertion.  Impression: No obstructive or restrictive dysfunction.  Diffusion capacity and exercise oximetry are within normal limits.  Recommendation: If asthma is a concern,  patient may be referred for methacholine challenge testing.    Cardiac catheterization MPI 12/22/2023  Severe codominant RCA and distal left circumflex bifurcation disease.  (LAD 60%, Lcx 70%, OM 70%, RCA ostail 90%, RCA 70%)              Decision at time of catheterization to try and medical treatment prior to stenting    ASSESSMENT/PLAN:    # Dyspnea on exertion   # Suspect possible asthma  # Environmental allergies    #  Significant CAD  # GERD w/ gastritis on EGD in 06/2023  # Morbid obesity      Based on her symptoms of wheezing, dyspnea, association of environmental allergies, I think asthma remains possible though I suspect CAD is certainly playing a role, as well as possibly deconditioning and GERD. I reviewed her PFTs from 2019 and they were normal, however that does not rule out asthma and a methacholine challenge is needed for a definitive diagnosis. Given the chronicity of her symptoms, it is also worth obtaining a chest CT to r/o other etiologies such as ILD, bronchiectasis, etc. She also has multiple cardiac risk factors so a repeat echocardiogram is warranted since it's been a year.     Plan:   - continue symbicort  160/4.72mcg 2 puffs bid  - continue zyrtec  10mg  daily given allergy symptoms  - continue omeprazole  40mg , defer to PCP/GI if a repeat EGD is warranted should symptoms persist  - methacholine challenge  - chest CT  - echocardiogram  - follow up in 3 months  - encouraged her to follow with cardiology as well       Carline Lento, MD  Division of Pulmonary and Critical Care Medicine  Pager: [phone]

## 2024-02-01 ENCOUNTER — Encounter (HOSPITAL_BASED_OUTPATIENT_CLINIC_OR_DEPARTMENT_OTHER): Payer: Self-pay | Admitting: Cardiovascular Disease

## 2024-02-01 ENCOUNTER — Other Ambulatory Visit: Payer: Self-pay

## 2024-02-01 ENCOUNTER — Ambulatory Visit: Attending: Cardiovascular Disease | Admitting: Cardiovascular Disease

## 2024-02-01 ENCOUNTER — Ambulatory Visit

## 2024-02-01 VITALS — BP 140/56 | HR 78 | Temp 96.5°F | Wt 195.2 lb

## 2024-02-01 DIAGNOSIS — R079 Chest pain, unspecified: Secondary | ICD-10-CM | POA: Diagnosis present

## 2024-02-01 DIAGNOSIS — E782 Mixed hyperlipidemia: Secondary | ICD-10-CM | POA: Insufficient documentation

## 2024-02-01 DIAGNOSIS — I5032 Chronic diastolic (congestive) heart failure: Secondary | ICD-10-CM | POA: Diagnosis present

## 2024-02-01 DIAGNOSIS — I25118 Atherosclerotic heart disease of native coronary artery with other forms of angina pectoris: Secondary | ICD-10-CM | POA: Diagnosis present

## 2024-02-01 DIAGNOSIS — E669 Obesity, unspecified: Secondary | ICD-10-CM | POA: Diagnosis present

## 2024-02-01 DIAGNOSIS — I10 Essential (primary) hypertension: Secondary | ICD-10-CM | POA: Diagnosis present

## 2024-02-01 DIAGNOSIS — N1831 Chronic kidney disease, stage 3a: Secondary | ICD-10-CM | POA: Diagnosis present

## 2024-02-01 DIAGNOSIS — R0609 Other forms of dyspnea: Secondary | ICD-10-CM | POA: Diagnosis present

## 2024-02-01 DIAGNOSIS — I701 Atherosclerosis of renal artery: Secondary | ICD-10-CM | POA: Insufficient documentation

## 2024-02-01 MED ORDER — NITROGLYCERIN 0.4 MG SL SUBL
0.4000 mg | SUBLINGUAL_TABLET | SUBLINGUAL | 0 refills | Status: AC | PRN
Start: 2024-02-01 — End: 2025-02-01

## 2024-02-01 MED ORDER — EZETIMIBE 10 MG PO TABS
10.0000 mg | ORAL_TABLET | Freq: Every day | ORAL | 3 refills | Status: AC
Start: 2024-02-01 — End: 2025-02-01

## 2024-02-01 MED ORDER — ISOSORBIDE MONONITRATE ER 30 MG PO TB24
30.0000 mg | ORAL_TABLET | Freq: Every day | ORAL | 0 refills | Status: DC
Start: 2024-02-01 — End: 2024-03-07

## 2024-02-01 MED ORDER — ATORVASTATIN CALCIUM 80 MG PO TABS
80.0000 mg | ORAL_TABLET | Freq: Every day | ORAL | 3 refills | Status: AC
Start: 2024-02-01 — End: 2025-01-31

## 2024-02-01 NOTE — Patient Instructions (Addendum)
#  1 start isosorbide  - long acting nitroglycerin   #2 change amlodipine /valsartan  to 1/2 pill  #3 echocardiogram    We will consider sending you for a stent if you are not feeling better after these changes

## 2024-02-07 ENCOUNTER — Ambulatory Visit (HOSPITAL_BASED_OUTPATIENT_CLINIC_OR_DEPARTMENT_OTHER)

## 2024-02-08 ENCOUNTER — Encounter (HOSPITAL_BASED_OUTPATIENT_CLINIC_OR_DEPARTMENT_OTHER): Payer: Self-pay | Admitting: Cardiovascular Disease

## 2024-02-08 ENCOUNTER — Encounter (LOCAL_COMMUNITY_HEALTH_CENTER): Payer: Self-pay

## 2024-02-08 ENCOUNTER — Other Ambulatory Visit (LOCAL_COMMUNITY_HEALTH_CENTER): Payer: Self-pay

## 2024-02-08 ENCOUNTER — Ambulatory Visit

## 2024-02-08 DIAGNOSIS — F411 Generalized anxiety disorder: Secondary | ICD-10-CM

## 2024-02-08 NOTE — Telephone Encounter (Signed)
 PER who is calling: Patient (self), Sara Snyder is a 68 year old female has requested a refill of    ativan .        Only complete for controlled medication:    Previously prescribed:  Start Date 01/11/2024         End Date 02/10/2024             Last Office Visit  : Visit date not found      Last Tele Visit:  11/16/2023      Last Physical Exam:  06/13/2020    PAP SMEAR due on 04/09/2024    Other Med Adult:  Most Recent BP Reading(s)  02/01/24 : 140/56        Cholesterol (mg/dL)   Date Value   93/79/7974 132     LOW DENSITY LIPOPROTEIN DIRECT (mg/dL)   Date Value   93/79/7974 80     HIGH DENSITY LIPOPROTEIN (mg/dL)   Date Value   93/79/7974 39 (L)     TRIGLYCERIDES (mg/dL)   Date Value   93/79/7974 140         THYROID  SCREEN TSH REFLEX FT4 (uIU/mL)   Date Value   10/20/2023 3.430         TSH (THYROID  STIM HORMONE) (uIU/mL)   Date Value   01/14/2021 7.090 (H)       HEMOGLOBIN A1C (%)   Date Value   12/03/2022 6.9 (H)       POC HEMOGLOBIN A1C (%)   Date Value   10/20/2023 6.5 (H)         INR (no units)   Date Value   12/10/2023 1.8       SODIUM (mmol/L)   Date Value   01/11/2024 139       POTASSIUM (mmol/L)   Date Value   01/11/2024 3.6           CREATININE (mg/dL)   Date Value   92/77/7974 1.6 (H)       Documented patient preferred pharmacies:    [facility] [place] - [address]  Phone: [phone] Fax: [phone]

## 2024-02-08 NOTE — Telephone Encounter (Signed)
 PER who is calling: Pharmacy, Sara Snyder is a 68 year old female has requested a refill of       ativan .        Only complete for controlled medication:    Previously prescribed:  Start Date 92777974         End Date 91787974             Last Office Visit  : Visit date not found      Last Tele Visit:  11/16/2023      Last Physical Exam:  06/13/2020    PAP SMEAR due on 04/09/2024    Other Med Adult:  Most Recent BP Reading(s)  02/01/24 : 140/56        Cholesterol (mg/dL)   Date Value   93/79/7974 132     LOW DENSITY LIPOPROTEIN DIRECT (mg/dL)   Date Value   93/79/7974 80     HIGH DENSITY LIPOPROTEIN (mg/dL)   Date Value   93/79/7974 39 (L)     TRIGLYCERIDES (mg/dL)   Date Value   93/79/7974 140         THYROID  SCREEN TSH REFLEX FT4 (uIU/mL)   Date Value   10/20/2023 3.430         TSH (THYROID  STIM HORMONE) (uIU/mL)   Date Value   01/14/2021 7.090 (H)       HEMOGLOBIN A1C (%)   Date Value   12/03/2022 6.9 (H)       POC HEMOGLOBIN A1C (%)   Date Value   10/20/2023 6.5 (H)         INR (no units)   Date Value   12/10/2023 1.8       SODIUM (mmol/L)   Date Value   01/11/2024 139       POTASSIUM (mmol/L)   Date Value   01/11/2024 3.6           CREATININE (mg/dL)   Date Value   92/77/7974 1.6 (H)       Documented patient preferred pharmacies:    [facility] [place], [place] - [address]  Phone: [phone] Fax: [phone]

## 2024-02-08 NOTE — Telephone Encounter (Signed)
 duplicate

## 2024-02-10 NOTE — Telephone Encounter (Signed)
 Hello,    Daughter is calling again     Please review

## 2024-02-11 ENCOUNTER — Encounter (HOSPITAL_BASED_OUTPATIENT_CLINIC_OR_DEPARTMENT_OTHER): Payer: Self-pay

## 2024-02-14 ENCOUNTER — Encounter (LOCAL_COMMUNITY_HEALTH_CENTER): Payer: Self-pay

## 2024-02-14 DIAGNOSIS — F411 Generalized anxiety disorder: Secondary | ICD-10-CM

## 2024-02-14 NOTE — Telephone Encounter (Signed)
 Hi, I am going to defer to the prescribing provider, who is on this message    It seems like patient has been asked to f/u monthly, Last seen by prescriber 12/2023.     Dalexa Gentz R. Veleria, MD  02/14/24 12:50 PM

## 2024-02-15 MED ORDER — LORAZEPAM 0.5 MG PO TABS
0.5000 mg | ORAL_TABLET | Freq: Every day | ORAL | 0 refills | Status: DC
Start: 2024-02-15 — End: 2024-03-14

## 2024-02-15 NOTE — Telephone Encounter (Signed)
 PER who is calling: Patient (self), Sara Snyder is a 68 year old female has requested a refill of       Ativan  .        Only complete for controlled medication:    Previously prescribed:  Start Date 92777974         End Date 91787974             Last Office Visit  : Visit date not found      Last Tele Visit:  11/16/2023      Last Physical Exam:  06/13/2020    PAP SMEAR due on 04/09/2024    Other Med Adult:  Most Recent BP Reading(s)  02/01/24 : 140/56        Cholesterol (mg/dL)   Date Value   93/79/7974 132     LOW DENSITY LIPOPROTEIN DIRECT (mg/dL)   Date Value   93/79/7974 80     HIGH DENSITY LIPOPROTEIN (mg/dL)   Date Value   93/79/7974 39 (L)     TRIGLYCERIDES (mg/dL)   Date Value   93/79/7974 140         THYROID  SCREEN TSH REFLEX FT4 (uIU/mL)   Date Value   10/20/2023 3.430         TSH (THYROID  STIM HORMONE) (uIU/mL)   Date Value   01/14/2021 7.090 (H)       HEMOGLOBIN A1C (%)   Date Value   12/03/2022 6.9 (H)       POC HEMOGLOBIN A1C (%)   Date Value   10/20/2023 6.5 (H)         INR (no units)   Date Value   12/10/2023 1.8       SODIUM (mmol/L)   Date Value   01/11/2024 139       POTASSIUM (mmol/L)   Date Value   01/11/2024 3.6           CREATININE (mg/dL)   Date Value   92/77/7974 1.6 (H)       Documented patient preferred pharmacies:    [facility] [place], [place] - [address]  Phone: [phone] Fax: [phone]

## 2024-02-16 ENCOUNTER — Ambulatory Visit: Admitting: Ophthalmology

## 2024-02-16 ENCOUNTER — Encounter (LOCAL_COMMUNITY_HEALTH_CENTER): Payer: Self-pay

## 2024-02-17 ENCOUNTER — Other Ambulatory Visit: Payer: Self-pay

## 2024-02-17 ENCOUNTER — Other Ambulatory Visit (HOSPITAL_BASED_OUTPATIENT_CLINIC_OR_DEPARTMENT_OTHER): Payer: Self-pay | Admitting: Pulmonary Disease

## 2024-02-17 ENCOUNTER — Ambulatory Visit (HOSPITAL_BASED_OUTPATIENT_CLINIC_OR_DEPARTMENT_OTHER)
Admission: RE | Admit: 2024-02-17 | Discharge: 2024-02-17 | Disposition: A | Discharge status: Home / Self Care | Source: Ambulatory Visit

## 2024-02-17 ENCOUNTER — Emergency Department
Admission: EM | Admit: 2024-02-17 | Discharge: 2024-02-17 | Disposition: A | Discharge status: Home / Self Care | Attending: Emergency Medicine | Admitting: Emergency Medicine

## 2024-02-17 ENCOUNTER — Emergency Department (HOSPITAL_BASED_OUTPATIENT_CLINIC_OR_DEPARTMENT_OTHER)

## 2024-02-17 ENCOUNTER — Ambulatory Visit
Admission: RE | Admit: 2024-02-17 | Discharge: 2024-02-17 | Disposition: A | Discharge status: Home / Self Care | Attending: Pulmonary Disease | Admitting: Pulmonary Disease

## 2024-02-17 ENCOUNTER — Telehealth (HOSPITAL_BASED_OUTPATIENT_CLINIC_OR_DEPARTMENT_OTHER): Payer: Self-pay | Admitting: Cardiovascular Disease

## 2024-02-17 DIAGNOSIS — M79675 Pain in left toe(s): Secondary | ICD-10-CM | POA: Insufficient documentation

## 2024-02-17 DIAGNOSIS — J4540 Moderate persistent asthma, uncomplicated: Secondary | ICD-10-CM

## 2024-02-17 DIAGNOSIS — R590 Localized enlarged lymph nodes: Secondary | ICD-10-CM

## 2024-02-17 DIAGNOSIS — R0609 Other forms of dyspnea: Secondary | ICD-10-CM | POA: Diagnosis present

## 2024-02-17 DIAGNOSIS — Z9109 Other allergy status, other than to drugs and biological substances: Secondary | ICD-10-CM

## 2024-02-17 DIAGNOSIS — M85872 Other specified disorders of bone density and structure, left ankle and foot: Secondary | ICD-10-CM

## 2024-02-17 DIAGNOSIS — J984 Other disorders of lung: Secondary | ICD-10-CM | POA: Diagnosis not present

## 2024-02-17 DIAGNOSIS — M19072 Primary osteoarthritis, left ankle and foot: Secondary | ICD-10-CM | POA: Diagnosis not present

## 2024-02-17 DIAGNOSIS — Z6841 Body Mass Index (BMI) 40.0 and over, adult: Secondary | ICD-10-CM

## 2024-02-17 LAB — ECHOCARDIOGRAM W/ ULTRASOUND CONTRAST: LVEF: 55 %

## 2024-02-17 MED ORDER — SULFUR HEXAFLUORIDE MICROSPH 60.7-25 MG IJ SUSR
1.0000 mL | Freq: Once | INTRAMUSCULAR | Status: AC
Start: 2024-02-17 — End: 2024-02-17

## 2024-02-17 MED ORDER — SULFUR HEXAFLUORIDE MICROSPH 60.7-25 MG IJ SUSR
INTRAMUSCULAR | Status: AC
Start: 2024-02-17 — End: 2024-02-17
  Administered 2024-02-17: 4 mL via INTRAVENOUS
  Filled 2024-02-17: qty 5

## 2024-02-17 NOTE — Narrator Note (Signed)
 Post-op shoe applied to left foot.

## 2024-02-17 NOTE — Narrator Note (Signed)
 Patient Disposition  Patient education for diagnosis, medications, activity, diet and follow-up.  Patient left ED 12:15 PM.  Patient rep received written instructions.    Interpreter to provide instructions: Yes; Interpreter ID: [email or web address]    Patient belongings with patient: YES    Have all existing LDAs been addressed? N/A    Have all IV infusions been stopped? N/A    Destination: Discharged to home - Discharge instructions reviewed with patient, including follow-up care. All questions answered. Patient verbalized understanding. Patient ambulating independently with steady gait.  Discharged home with family.

## 2024-02-17 NOTE — ED Triage Note (Signed)
 Pt arrives ambulatory with reports of L 3rd to pain. Pt denies injury or trauma. Pt took tylenol  PTA.

## 2024-02-17 NOTE — Telephone Encounter (Signed)
 Please call daughter and see if changes below were made and how patient is responding  #1 start isosorbide  - long acting nitroglycerin   #2 change amlodipine /valsartan  to 1/2 pill    Her echo showed her heart is pumping normally.     Our plan was to refer for cath with possible revascularization if she was not feeling better  Based on the echo we could also try increasing torsemide  further  Thanks, Geni

## 2024-02-17 NOTE — Discharge Instructions (Addendum)
 You were evaluated in the emergency department today regarding your toe pain.  The x-ray did not show any acute fracture or dislocations.  I encourage you to wear the postop shoe and follow-up podiatry have placed a referral for you.    Please continue using Tylenol  as needed for pain.      For any new or worsening signs or symptoms including redness, increased swelling, pain out of proportion please come back to the emergency department.

## 2024-02-17 NOTE — ED Provider Notes (Signed)
 The patient was seen primarily by me. ED nursing record was reviewed. Select prior records as available electronically through the Epic record were reviewed.    History, physical exam and disposition were conducted with an official hospital [place] interpreter.    HPI:    Sara Snyder is a 68 year old female patient presents the emergency department today with chief complaint of atraumatic left second digit toe pain that began this morning.  Patient was unsure if she had bumped or stubbed her left toe.  She endorses pain most notably with walking.  Denies any numbness or tingling.  Denies any other pain or radiation of pain.  She has been taking Tylenol  with mild relief of symptoms.  Denies any fevers, chills, night sweats.  Denies any recent travel and/or surgeries.  She denies any history of gout she is not anxious.    ROS: Pertinent positives were reviewed as per the HPI above. All other systems were reviewed and are negative.  Sara Snyder  Language of care: [place])  MRN: [id number]  PCP: Sara Rosalynn SAUNDERS, MD  Mode of arrival to ED: Relative.  Arrival time:     Chief complaint: Toe Pain    Past Medical History/Problem list:  Past Medical History:  09/11/2018: Chronic kidney disease, stage III (moderate) (HCC)  08/14/2019: COVID-19      Comment:  Risk category:   high  Date of symptom onset: 08/12/19                 Tested? Positive  Risk factors: CKD, BMI > 30, HTN                 Clinical Course First date of symptoms:  08/12/19                 08/15/19: (DOI 3): Pt states she is feeling well. Slight                ST and sinus congestion. No cough, fever, SOB, DOE or                edema. F/U in 3 days.  08/18/2019: DOI 7- Feeling better                over all, stable vitals, CM f/u in 2 days. 08/20/19 DOI 9-               stable sx; o2 ex  11/16/2023: Gastritis without bleeding  No date: History of hypertension  No date: HTN (hypertension)  01/05/2018: Mild episode of recurrent major  depressive disorder (HCC)  02/20/2021: Osteopenia  11/29/2017: Panic disorder  No date: Wears eyeglasses  Patient Active Problem List:     Essential hypertension     Labyrinthitis     Panic disorder     Hyperlipidemia     Mild episode of recurrent major depressive disorder (HCC)     Allergic rhinitis     Chronic pain of both shoulders     Stage 3a chronic kidney disease     Anxiety state     Psychophysiological insomnia     Secondary hyperparathyroidism (HCC)     Osteopenia     Carpal tunnel syndrome of right wrist     Fibromyalgia     Hair loss     Vitamin D  deficiency     Obesity (BMI 30-39.9)     Bilateral renal artery stenosis (HCC)     DOE (dyspnea on exertion)  Recurrent epistaxis     Gastroesophageal reflux disease without esophagitis     Other specified hypothyroidism     Combined forms of age-related cataract of right eye     Cervical cancer screening     Prediabetes     Healthcare maintenance     Schistosomiasis     Intraoperative floppy iris syndrome (IFIS)     Combined forms of age-related cataract of left eye     Varicose veins of calf     Chronic bilateral low back pain without sciatica     Morbid obesity with BMI 40.0-44.9, adult (HCC)     Abnormal mammogram     Tubular adenoma of colon     Angina pectoris [facility])     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 incontinence     Gastritis without bleeding     Atrial fibrillation with RVR (HCC)    Past Surgical History: Past Surgical History:  No date: CATARACT REMOVAL INSERTION OF LENS; Right  10/2016: HEMORRHOID SURGERY  No date: OB ANTEPARTUM CARE CESAREAN DLVR & POSTPARTUM  Social History:   Social History     Socioeconomic History    Marital status: Married     Spouse name: Not on file    Number of children: Not on file    Years of education: Not on file    Highest education level: Not on file   Occupational History    Not on file   Tobacco Use    Smoking status: Former     Current packs/day: 0.00     Average packs/day: 2.0 packs/day for 0.3 years (0.7 ttl pk-yrs)     Types:  Cigarettes     Start date: 8     Quit date: 10/27/1977     Years since quitting: 46.3    Smokeless tobacco: Never   Substance and Sexual Activity    Alcohol use: Not Currently    Drug use: Never    Sexual activity: Yes     Partners: Male   Other Topics Concern    Not on file   Social History Narrative    Originally from [place], [place]    History of childhood trauma    Lives with family   Social Drivers of Health  Financial Resource Strain: Not on file  Food Insecurity: Not on file  Transportation Needs: Not on file  Physical Activity: Not on file  Stress: Not on file  Social Connections: Not on file  Intimate Partner Violence: Not on file  Housing Stability: Not on file   Allergies: Review of Patient's Allergies indicates:   Pollen extract          Cough  Immunizations:   Immunization History   Administered Date(s) Administered    Covid-19 Vaccine (Pfizer - [employer] Cap) 02/06/2021    Covid-19 Vaccine [employer] - Purple Cap) 09/06/2019, 09/27/2019, 06/13/2020    Covid-19 Vaccine (Pfizer Bivalent 70yo>) 07/14/2021    Influenza Fluzone High Dose Triv 0.5ml 55yr> (Preservative free) 04/06/2023    Influenza Vaccine High Dose 0.55ml 65 And Older 07/14/2021, 06/11/2022    Influenza Virus Quad Presv Free Vacc 6 Mo and Older, IM 05/23/2018, 04/10/2019, 06/13/2020    PCV13 04/10/2019    PNEUMOCOCCAL POLYSACCHARIDE VACCINE v23 02/06/2021    Tdap 11/17/2017          Medications:  Prior to Admission Medications   Prescriptions Last Dose Informant Patient Reported? Taking?   Bimatoprost  (LUMIGAN ) 0.01 % SOLN   No No   Sig: Place 1 drop into  both eyes nightly   Blood Pressure KIT   No No   Sig: Use 1 kit As Directed daily before breakfast Regular ICD i10   LORazepam  (ATIVAN ) 0.5 MG tablet   Yes No   Sig: Take 0.5 mg by mouth every 6 (six) hours as needed for Anxiety   LORazepam  (ATIVAN ) 0.5 MG tablet   No No   Sig: Take 1 tablet by mouth daily   OTHER MEDICATION   No No   Sig: Use 1 each As Directed daily Blood Pressure Monitoring Kit    Arm Circumference LARGE  For use as directed: Please Dispense AMA validated cuff see [email or web address] (such as [employer])       DX: hypertension  ICD10: I10   amlodipine -valsartan  (EXFORGE ) 10-320 MG per tablet   Yes No   Sig: Take 0.5 tablets by mouth daily   apixaban  (ELIQUIS ) 5 MG po tablet   No No   Sig: Take 1 tablet by mouth in the morning and 1 tablet before bedtime.   atorvastatin  (LIPITOR) 80 MG tablet   No No   Sig: Take 1 tablet by mouth daily   budesonide -formoterol  (SYMBICORT ,BREYNA ) 160-4.5 MCG/ACT inhaler   No No   Sig: Inhale 2 puffs into the lungs in the morning and 2 puffs before bedtime. Use 1 puff prn during the day. Rinse mouth after use.   calcium  citrate-citamin D (CALCIUM  CITRATE +) 315-5 MG-MCG tablet   No No   Sig: Take 1 tablet by mouth in the morning and 1 tablet before bedtime.   cholecalciferol  (VITAMIN D3) 2000 UNIT tablet   No No   Sig: Take 1 tablet by mouth in the morning and 1 tablet before bedtime.   Patient not taking: Reported on 09/14/2023   escitalopram  (LEXAPRO ) 10 MG tablet   No No   Sig: Take 1 tablet by mouth daily With breakfast   ezetimibe  (ZETIA ) 10 MG tablet   No No   Sig: Take 1 tablet by mouth daily   famotidine  (PEPCID ) 40 MG tablet   No No   Sig: Take 1 tablet by mouth at bedtime   ferrous sulfate  325 (65 FE) MG tablet   No No   Sig: Take 1 tablet by mouth every other day   gabapentin  (NEURONTIN ) 100 MG capsule   No No   Sig: Take 1 capsule by mouth nightly   isosorbide  mononitrate (IMDUR ) 30 MG 24 hr tablet   No No   Sig: Take 1 tablet by mouth daily   levothyroxine  (SYNTHROID ) 50 MCG tablet   No No   Sig: Take 1 tablet by mouth every morning before breakfast   meclizine  (ANTIVERT ) 25 MG TABS   No No   Sig: Take 1 tablet by mouth every 8 (eight) hours as needed   metoprolol  (TOPROL -XL) 50 MG 24 hr tablet   No No   Sig: Take 1 tablet by mouth daily   nitroGLYCERIN  (NITROSTAT ) 0.4 MG sublingual tablet   No No   Sig: Place 1 tablet under the tongue every 5 (five)  minutes as needed for Chest pain   omeprazole  (PRILOSEC) 40 MG capsule   No No   Sig: Take 1 capsule by mouth daily   torsemide  (DEMADEX ) 10 MG tablet   No No   Sig: Take 2 tablets by mouth daily      Facility-Administered Medications: None     Physical Exam:   Patient Vitals for the past 99 hrs:   BP Temp Pulse  Resp SpO2   02/17/24 1056 155/95 97.9 F 72 17 98 %     Physical Exam  Vitals and nursing note reviewed.   Constitutional:       General: She is not in acute distress.     Appearance: Normal appearance. She is normal weight. She is not ill-appearing or toxic-appearing.   Cardiovascular:      Rate and Rhythm: Normal rate and regular rhythm.      Pulses: Normal pulses.      Heart sounds: Normal heart sounds. No murmur heard.     No friction rub. No gallop.   Pulmonary:      Effort: Pulmonary effort is normal. No respiratory distress.      Breath sounds: Normal breath sounds. No wheezing, rhonchi or rales.   Chest:      Chest wall: No tenderness.   Abdominal:      General: Abdomen is flat.      Tenderness: There is no abdominal tenderness. There is no right CVA tenderness or left CVA tenderness.   Musculoskeletal:         General: Normal range of motion.      Cervical back: Normal range of motion and neck supple. No rigidity.      Comments: Patient with full range of motion at the distal toe, neurovascularly neurologically intact.  Some mild tenderness to palpation of the left second toe.   Lymphadenopathy:      Cervical: No cervical adenopathy.   Skin:     Capillary Refill: Capillary refill takes less than 2 seconds.      Comments: Very mild erythema to the medial aspect of the 2nd digit with flaking of skin   Neurological:      General: No focal deficit present.      Mental Status: She is alert and oriented to person, place, and time. Mental status is at baseline.   Psychiatric:         Mood and Affect: Mood normal.         Behavior: Behavior normal.         Thought Content: Thought content normal.        Medications Given in the ED:  Medications - No data to display Radiology Results:  Echocardiogram w/ Ultrasound Contrast  Result Date: 02/17/2024  Conclusions: (click link below for full report) 1. Left Ventricle: There is mild concentric hypertrophy. 2. Left Ventricle: Global systolic function: EF is estimated visually at 55% . 3. Left Ventricle: Regional systolic function: Wall motion: There are no regional wall motion abnormalities. 4. Left Ventricle: Diastolic function: There is grade III diastolic dysfunction. The LV end diastolic pressure is elevated. 5. Left Atrium: Severely dilated by volume measuring 49 ml/m. 6. Aortic Valve: There is mild sclerosis; no stenosis. 7. Tricuspid Valve: The RVSP is estimated at 32 mmHg which is normal. 8. Aorta: Normal aortic root measuring: 3.9 cm Dilated ascending aorta measuring 4 cm [2.2-3.6 cm]. Ascending aorta Index is 2.24 cm/m (Normal upper limits- Female: 1.9 cm/m2; Female: 2.2 cm/m2). 9. As compared to the previous echocardiogram on 01/19/2023 - aorta measures larger today and diastolic function has worsened.    XR Toe(s) Left Minimum 2 views  Result Date: 02/17/2024  Exam: Left Toes, 3 views Indication: Toe pain Comparison: none Findings: Bones and Joints: There is no evidence of acute fracture or dislocation. Isolated small periarticular erosion is seen in the dorsal aspect of the distal phalanx base.. There is mild joint space narrowing associated with  marginal osteophytes atm Soft Tissues: Unremarkable.       1. Isolated finding of a focal erosion in the dorsum of the first distal phalanx. 2. Mild degenerative discogenic disease. Reviewed and Electronically Signed By: Hassie Gibson, MD Signed Date and Time: 02/17/2024 11:56 AM     CT Chest WO Contrast  Result Date: 02/17/2024  CLINICAL INDICATION: chronic dyspnea COMPARISON: Chest radiograph 06/07/2022. TECHNIQUE: CT of the thorax without IV contrast with multiplanar reformats. Radiation Dose: Radiation dose  reduction techniques were employed. CTDIvol: 12.9 mGy. DLP: 428 mGy-cm. FINDINGS: Lungs: No consolidation or honeycombing. There is a 1.7 cm thin-walled pulmonary cyst in the right lower lobe. -2 mm triangular perifissural nodule in the right lower lobe, (image #140). By Fleischner guidelines, perifissural and subpleural nodules with typical morphology are almost certainly benign lymphoid tissue and generally do not require further follow-up. Follow-up can be considered based on clinical risk factors. There are multiple (at least 10) scattered benign calcified granulomas measuring up to 4 mm. Pleura: No pleural effusion or pneumothorax. Airways: Patent. Vessels: The aorta is nondilated.  The pulmonary artery is nondilated. Heart: Cardiac size is normal. There are severe coronary artery calcifications. There is no pericardial effusion. Mediastinum/Lymph nodes: There is a mildly enlarged right paratracheal node measuring 1.1 cm in short axis. There are nonenlarged calcified pulmonary hilar nodes. Limited evaluation of the pulmonary hilar regions due to noncontrast technique. Thyroid : Partially imaged thyroid  is unremarkable. Chest wall: Unremarkable. Bones: There are minimal degenerative vertebral endplate changes. Upper abdomen: There is a tiny hiatal hernia. In the liver there are subcentimeter hypodensities too small to fully characterize, however likely representing simple cysts.     Multiple scattered benign calcified granulomas, and calcified mediastinal lymph nodes. There is a mildly enlarged right mediastinal lymph node. This could represent prior granulomatous disease of either infectious or inflammatory etiology. Reviewed and Electronically Signed By: Ray Rouse MD Signed Date and Time: 02/17/2024 11:01 AM      Lab Results (abnormal results only):  Labs Reviewed - No data to display Other Results/Old Record review (e.g. ECG):  Records from an outside hospital system, primary care office, or prior inpatient  notes were reviewed during this encounter: Prior ED visits, patient had CT chest without contrast and echocardiogram today.     ED Course and Medical Decision-making:    68 year old female patient atraumatic left second digit toe pain that began this morning.    Vital signs stable, patient is alert and oriented no acute distress resting comfortably on stretcher. Patient is very pleasant, appears well, hemodynamically stable, able to ambulate into the emergency department without any difficulties.  Physical exam was significant for tenderness palpation to the left second toe.  Very mild erythema noted on the medial aspect as well.  Possibly consistent with tinea given the flaking of skin as well.  No concern for cellulitis or bacterial infection.  Low concern for PTA as patient is nontender palpation of the gastrocnemius, equal bilateral pulses. Patient took Tylenol  prior to arrival.  Will obtain x-rays of the left foot.  There is no lower extremity swelling erythema, no evidence of infection, mild warmth to touch low concern for gout.     X-rays interpreted by myself show no obvious fracture or dislocation, read by radiology as focal erosion of the dorsum of the first distal phalanx, mild degenerative discogenic disease.  No acute fractures or dislocations.  Do suspect possible contusion-although absence of trauma possible fungal infection.  Patient was provided with  a postop shoe and referral to podiatry.  Did encourage use of over-the-counter Lotrimin cream/powder for flaking of skin.  Given strict return precautions for new or worsening signs or symptoms such as redness, swelling, discharge.  Patient verbalized understands plan deny questions time, she remained hemodynamically stable in the emergency department today.    Additional history provided by patient's 2 daughters who are present at bedside.    ED Course as of 02/17/24 1339   Thu Feb 17, 2024   1201 XR Toe(s) Left Minimum 2 views  IMPRESSION:      1.  Isolated finding of a focal erosion in the dorsum of the first distal phalanx.  2. Mild degenerative discogenic disease.       Patient/family educated on diagnosis(es); she states understanding and agrees with plan of care.  Reasons to return to the ED were reviewed in detail. She agrees with this plan and disposition.    Condition on Discharge: Improved and Stable      Diagnosis/Diagnoses:  Toe pain, left    Discharge Prescriptions:  Discharge Medication List as of 02/17/2024 12:03 PM      Mabel Falter, PA-C  This Emergency Department patient encounter note was created using voice-recognition software and in real time during the ED visit.

## 2024-02-18 NOTE — Telephone Encounter (Signed)
 Called patient's daughter [name] via telephone interpreter ([place] - ID#:[id number]) as noted below. No answer, left message for call back to CMS.    Burnetta Geni CROME, MD routed this conversation to [facility] Nurses Pool    02/18/24 12:46 PM     Good to hear  As she has seen some improvement with isosorbide  I would rec increasing the dose. Increase isosorbide  to 60 mg = two 30 mg tabs for now. If needed we can increase further  The aorta was also checked on a ct scan yesterday and was normal on the ct scan. I am not concerned about the aorta - it is not causing any of her current sxs   There is not a large difference in the size of the left atrium when I review the actual numbers so also nothing to change for that  Has appt w cards 9/16  Thanks  [place]

## 2024-02-18 NOTE — Telephone Encounter (Signed)
 Called patient's daughter [name] via telephone interpreter [place] - ID#:[id number]) as noted below. [name] confirms patient has been taking medication as prescribed. Per [name], patient is still having fatigue and shortness of breath with exertion but not as frequent as before. [name] reviewed echocardiogram and is asking about the change in left atrium size compared to echo in 2024. [name] advised a message would be sent to Dr. Burnetta and we would get back to her with recommendations.    Burnetta Geni CROME, MD routed this conversation to [facility] Nurses Poo   02/17/24  6:33 PM     Please call daughter and see if changes below were made and how patient is responding  #1 start isosorbide  - long acting nitroglycerin   #2 change amlodipine /valsartan  to 1/2 pill    Her echo showed her heart is pumping normally.     Our plan was to refer for cath with possible revascularization if she was not feeling better  Based on the echo we could also try increasing torsemide  further  Thanks, Geni

## 2024-02-18 NOTE — Telephone Encounter (Signed)
 Spoke to patient's daughter [name] via telephone interpreter [place] - PI#:[id number]) reviewing Dr. Burnetta recommendations. [name] confirmed an understanding and all questions answered.

## 2024-02-18 NOTE — Telephone Encounter (Signed)
 Good to hear  As she has seen some improvement with isosorbide  I would rec increasing the dose. Increase isosorbide  to 60 mg = two 30 mg tabs for now. If needed we can increase further  The aorta was also checked on a ct scan yesterday and was normal on the ct scan. I am not concerned about the aorta - it is not causing any of her current sxs   There is not a large difference in the size of the left atrium when I review the actual numbers so also nothing to change for that  Has appt w cards 9/16  Thanks  [place]

## 2024-02-22 ENCOUNTER — Encounter (HOSPITAL_BASED_OUTPATIENT_CLINIC_OR_DEPARTMENT_OTHER): Payer: Self-pay

## 2024-02-22 ENCOUNTER — Other Ambulatory Visit: Payer: Self-pay

## 2024-02-22 ENCOUNTER — Encounter (HOSPITAL_BASED_OUTPATIENT_CLINIC_OR_DEPARTMENT_OTHER): Payer: Self-pay | Admitting: Cardiovascular Disease

## 2024-02-22 ENCOUNTER — Ambulatory Visit: Attending: Pulmonary Disease

## 2024-02-22 ENCOUNTER — Encounter (LOCAL_COMMUNITY_HEALTH_CENTER): Payer: Self-pay

## 2024-02-22 DIAGNOSIS — K219 Gastro-esophageal reflux disease without esophagitis: Secondary | ICD-10-CM

## 2024-02-22 DIAGNOSIS — K2970 Gastritis, unspecified, without bleeding: Secondary | ICD-10-CM

## 2024-02-22 DIAGNOSIS — R0609 Other forms of dyspnea: Secondary | ICD-10-CM | POA: Diagnosis present

## 2024-02-22 DIAGNOSIS — N1831 Chronic kidney disease, stage 3a: Secondary | ICD-10-CM

## 2024-02-22 MED ORDER — METHACHOLINE 4 MG/ML (3ML PREMIX) INHALATION SOLUTION
3.0000 mL | Freq: Once | RESPIRATORY_TRACT | Status: AC | PRN
Start: 2024-02-22 — End: 2024-02-22
  Administered 2024-02-22: 3 mL via RESPIRATORY_TRACT
  Filled 2024-02-22: qty 3

## 2024-02-22 MED ORDER — METHACHOLINE 16 MG/ML (3ML PREMIX) INHALATION SOLUTION
3.0000 mL | Freq: Once | RESPIRATORY_TRACT | Status: DC | PRN
Start: 2024-02-22 — End: 2024-02-22
  Filled 2024-02-22: qty 3

## 2024-02-22 MED ORDER — IPRATROPIUM-ALBUTEROL 0.5-2.5 (3) MG/3ML IN SOLN
3.0000 mL | Freq: Once | RESPIRATORY_TRACT | Status: AC | PRN
Start: 2024-02-22 — End: 2024-02-22
  Administered 2024-02-22: 3 mL via RESPIRATORY_TRACT
  Filled 2024-02-22: qty 3

## 2024-02-22 MED ORDER — ESCITALOPRAM OXALATE 10 MG PO TABS
10.0000 mg | ORAL_TABLET | Freq: Every day | ORAL | 2 refills | Status: DC
Start: 2024-02-22 — End: 2024-03-29

## 2024-02-22 MED ORDER — METHACHOLINE 0.25 MG/ML (3ML PREMIX) INHALATION SOLUTION
3.0000 mL | Freq: Once | RESPIRATORY_TRACT | Status: AC | PRN
Start: 2024-02-22 — End: 2024-02-22
  Administered 2024-02-22: 3 mL via RESPIRATORY_TRACT
  Filled 2024-02-22: qty 3

## 2024-02-22 MED ORDER — IPRATROPIUM-ALBUTEROL 0.5-2.5 (3) MG/3ML IN SOLN
3.0000 mL | Freq: Once | RESPIRATORY_TRACT | Status: DC | PRN
Start: 2024-02-22 — End: 2024-02-22
  Filled 2024-02-22: qty 3

## 2024-02-22 MED ORDER — METHACHOLINE 0.0625 MG/ML (3ML PREMIX) INHALATION SOLUTION
3.0000 mL | Freq: Once | RESPIRATORY_TRACT | Status: AC
Start: 2024-02-22 — End: 2024-02-22
  Administered 2024-02-22: 3 mL via RESPIRATORY_TRACT
  Filled 2024-02-22: qty 3

## 2024-02-22 MED ORDER — METHACHOLINE 1 MG/ML (3ML PREMIX) INHALATION SOLUTION
3.0000 mL | Freq: Once | RESPIRATORY_TRACT | Status: AC | PRN
Start: 2024-02-22 — End: 2024-02-22
  Administered 2024-02-22: 3 mL via RESPIRATORY_TRACT
  Filled 2024-02-22: qty 3

## 2024-02-22 NOTE — Telephone Encounter (Signed)
 PER who is calling: Patient (self), Sara Snyder is a 68 year old female has requested a refill of escitalopram  .    Last Office Visit: 02/01/24     Other Med Adult:  Most Recent BP Reading(s)  02/17/24 : 155/95        Cholesterol (mg/dL)   Date Value   93/79/7974 132     LOW DENSITY LIPOPROTEIN DIRECT (mg/dL)   Date Value   93/79/7974 80     HIGH DENSITY LIPOPROTEIN (mg/dL)   Date Value   93/79/7974 39 (L)     TRIGLYCERIDES (mg/dL)   Date Value   93/79/7974 140         THYROID  SCREEN TSH REFLEX FT4 (uIU/mL)   Date Value   10/20/2023 3.430         TSH (THYROID  STIM HORMONE) (uIU/mL)   Date Value   01/14/2021 7.090 (H)       HEMOGLOBIN A1C (%)   Date Value   12/03/2022 6.9 (H)       POC HEMOGLOBIN A1C (%)   Date Value   10/20/2023 6.5 (H)         INR (no units)   Date Value   12/10/2023 1.8       SODIUM (mmol/L)   Date Value   01/11/2024 139       POTASSIUM (mmol/L)   Date Value   01/11/2024 3.6           CREATININE (mg/dL)   Date Value   92/77/7974 1.6 (H)       Documented patient preferred pharmacies:    [facility] [place], [place] - [address]  Phone: [phone] Fax: [phone]

## 2024-02-22 NOTE — Telephone Encounter (Signed)
 PER who is calling: Patient (self), Sara Snyder is a 68 year old female has requested a refill of famotidine  .    Last Office Visit: 02/01/24     Other Med Adult:  Most Recent BP Reading(s)  02/17/24 : 155/95        Cholesterol (mg/dL)   Date Value   93/79/7974 132     LOW DENSITY LIPOPROTEIN DIRECT (mg/dL)   Date Value   93/79/7974 80     HIGH DENSITY LIPOPROTEIN (mg/dL)   Date Value   93/79/7974 39 (L)     TRIGLYCERIDES (mg/dL)   Date Value   93/79/7974 140         THYROID  SCREEN TSH REFLEX FT4 (uIU/mL)   Date Value   10/20/2023 3.430         TSH (THYROID  STIM HORMONE) (uIU/mL)   Date Value   01/14/2021 7.090 (H)       HEMOGLOBIN A1C (%)   Date Value   12/03/2022 6.9 (H)       POC HEMOGLOBIN A1C (%)   Date Value   10/20/2023 6.5 (H)         INR (no units)   Date Value   12/10/2023 1.8       SODIUM (mmol/L)   Date Value   01/11/2024 139       POTASSIUM (mmol/L)   Date Value   01/11/2024 3.6           CREATININE (mg/dL)   Date Value   92/77/7974 1.6 (H)       Documented patient preferred pharmacies:    [facility] [place], [place] - [address]  Phone: [phone] Fax: [phone]

## 2024-02-23 MED ORDER — FAMOTIDINE 40 MG PO TABS: 40.0000 mg | ORAL_TABLET | Freq: Every evening | ORAL | 0 refills | Status: DC

## 2024-02-24 ENCOUNTER — Ambulatory Visit

## 2024-02-24 NOTE — Procedures (Signed)
 SUMMARY: This patient underwent bronchoprovation testing by  methacholine  challenge. The PC20, which is the provocative  concentration of methacholine  needed to produce a 20% reduction in  the FEV1, was calculated by interpolation to be 2.28  .  INTERPRETATION: This result is consistent with airway  hyperresponsiveness.

## 2024-02-29 ENCOUNTER — Ambulatory Visit

## 2024-02-29 NOTE — Progress Notes (Deleted)
 Patient left without being seen.

## 2024-02-29 NOTE — Progress Notes (Signed)
 Called Ms. Reinhold and spoke with her daughter, she is unable to do the televisit today and will need to reschedule.

## 2024-03-07 ENCOUNTER — Other Ambulatory Visit: Payer: Self-pay

## 2024-03-07 ENCOUNTER — Encounter (HOSPITAL_BASED_OUTPATIENT_CLINIC_OR_DEPARTMENT_OTHER): Payer: Self-pay

## 2024-03-07 ENCOUNTER — Ambulatory Visit

## 2024-03-07 VITALS — BP 153/63 | HR 70 | Temp 96.8°F | Wt 190.0 lb

## 2024-03-07 DIAGNOSIS — F419 Anxiety disorder, unspecified: Secondary | ICD-10-CM | POA: Insufficient documentation

## 2024-03-07 DIAGNOSIS — Z8719 Personal history of other diseases of the digestive system: Secondary | ICD-10-CM | POA: Diagnosis present

## 2024-03-07 DIAGNOSIS — I10 Essential (primary) hypertension: Secondary | ICD-10-CM | POA: Insufficient documentation

## 2024-03-07 DIAGNOSIS — R0609 Other forms of dyspnea: Secondary | ICD-10-CM | POA: Insufficient documentation

## 2024-03-07 DIAGNOSIS — E782 Mixed hyperlipidemia: Secondary | ICD-10-CM | POA: Diagnosis present

## 2024-03-07 DIAGNOSIS — I480 Paroxysmal atrial fibrillation: Secondary | ICD-10-CM | POA: Insufficient documentation

## 2024-03-07 DIAGNOSIS — R109 Unspecified abdominal pain: Secondary | ICD-10-CM | POA: Insufficient documentation

## 2024-03-07 DIAGNOSIS — I25118 Atherosclerotic heart disease of native coronary artery with other forms of angina pectoris: Secondary | ICD-10-CM | POA: Diagnosis present

## 2024-03-07 DIAGNOSIS — I5032 Chronic diastolic (congestive) heart failure: Secondary | ICD-10-CM | POA: Diagnosis present

## 2024-03-07 MED ORDER — AMLODIPINE BESYLATE-VALSARTAN 10-320 MG PO TABS
1.0000 | ORAL_TABLET | Freq: Every day | ORAL | 3 refills | Status: DC
Start: 2024-03-07 — End: 2024-04-11

## 2024-03-07 NOTE — Progress Notes (Signed)
 OUTPATIENT CARDIOLOGY PROGRESS NOTE  Date of visit: 03/07/2024    Patient returns for follow-up in cardiology clinic.      In summary patient is 68 year old female with the following medical problems:  Coronary artery disease of native artery of native heart with stable angina pectoris (HCC)  (primary encounter diagnosis)  PAF (paroxysmal atrial fibrillation) (HCC)  Chronic heart failure with preserved ejection fraction (HCC)  Essential hypertension  Mixed hyperlipidemia  Hx of chronic gastritis    Last 02/01/24 Dr Burnetta - PMH with HTN, CKD 3, RAS, low burden of PAC and PVCS on holter, HL, overweight/obesity.   - New AF on Zio 10/2023 started on Eliquis , cont' metop   - CAD - sig RCA and LCX disease. Plan to try med RX. Has DOE unclear if this is angina vs CHF. If no improvement, send for revasc w/ cath.   - Trial Isosorbide  (reduce amlodipine  and vaslsartan to avoid hypotension) and await update echo   - Reporting some low BP's at home 90-100s SBP   - Added ezetimibe  7/25     A1C 6.5%   LDL 80 (11/2023)     Echo   02/17/24: 55% no WMA   Severely dilated LA. Grade 3 diastolic dysfunction. Mild LVH.     Cath 12/22/23 - severe co-dominant RCA and distal LCX bifurcation disease     Seeing Dr. Margart Leisure 05/09/24     Today: Here with two daughters Wilkie and Shasta Moccasin).  BP 153/63.  Her breathing has not improved. She has fatigue and all over body and bone pain. She has stomach aches and aches over her pelvic area. Feels stomach fullness despite not eating much. Feels this is the sensation which causes her to lose her breath. Last week she reports she had chest pain about every day. Pain comes and goes. When pain comes, she gets really anxious, feels BP rise and starts to panic. Reports the pain is only relieved by taking the Lorazepam .     Used to take 1mg  Lorazepam  BID. Now reduced to 0.5mg  per day. She is very anxious and nervous a lot of the time. She is very upset about this dose change and asks that we speak w/ her  psychiatrist. She is also upset that she is taking so many medications per day. She used to take longer showers but now shorter b/c of pain and anxiety.     Reports a new symptom, one she hasn't even told her daughters about. Having a sharp and strong R arm pain. Ongoing for one week. She is so nervous about this. She thinks she won't wake up in the morning. She becomes hysterical when discussing this and crying.     Brings in a BP/HR log since the last visit   Started Isosorbide  and reduced dose amlodipine  8/19:  BP's 90-110s over 60s diastolic; HR's 60-80s     Started 8/28 with 2 tablets Isosorbide :   - BP's 110-120s, HR's 60-70s     9/6: Woke up with tight chest pain going to L breast around 5pm. BP was 135/73 and HR 70 bpm. Lasted around 30 minutes. No radiation to arm/neck/jaw.      EGD 04/2023 - Patchy mild inflammation entire stomach. Biopsy showing mild gastritis and intestinal metaplasia. No H.Pylori. Remains on both Omeprazole  40mg  AM and Pepcid  40mg  PM.     Current Medications:  famotidine  (PEPCID ) 40 MG tablet, Take 1 tablet by mouth at bedtime, Disp: 90 tablet, Rfl: 0  escitalopram  (  LEXAPRO ) 10 MG tablet, Take 1 tablet by mouth daily With breakfast, Disp: 30 tablet, Rfl: 2  LORazepam  (ATIVAN ) 0.5 MG tablet, Take 1 tablet by mouth daily, Disp: 30 tablet, Rfl: 0  isosorbide  mononitrate (IMDUR ) 30 MG 24 hr tablet, Take 1 tablet by mouth daily, Disp: 90 tablet, Rfl: 0  nitroGLYCERIN  (NITROSTAT ) 0.4 MG sublingual tablet, Place 1 tablet under the tongue every 5 (five) minutes as needed for Chest pain, Disp: 25 tablet, Rfl: 0  ezetimibe  (ZETIA ) 10 MG tablet, Take 1 tablet by mouth daily, Disp: 90 tablet, Rfl: 3  atorvastatin  (LIPITOR) 80 MG tablet, Take 1 tablet by mouth daily, Disp: 90 tablet, Rfl: 3  amlodipine -valsartan  (EXFORGE ) 10-320 MG per tablet, Take 0.5 tablets by mouth daily, Disp: , Rfl:   Bimatoprost  (LUMIGAN ) 0.01 % SOLN, Place 1 drop into both eyes nightly, Disp: 2.5 mL, Rfl: 11  gabapentin   (NEURONTIN ) 100 MG capsule, Take 1 capsule by mouth nightly, Disp: 30 capsule, Rfl: 0  LORazepam  (ATIVAN ) 0.5 MG tablet, Take 0.5 mg by mouth every 6 (six) hours as needed for Anxiety, Disp: , Rfl:   apixaban  (ELIQUIS ) 5 MG po tablet, Take 1 tablet by mouth in the morning and 1 tablet before bedtime., Disp: 90 tablet, Rfl: 3  torsemide  (DEMADEX ) 10 MG tablet, Take 2 tablets by mouth daily, Disp: 180 tablet, Rfl: 1  metoprolol  (TOPROL -XL) 50 MG 24 hr tablet, Take 1 tablet by mouth daily, Disp: 90 tablet, Rfl: 3  budesonide -formoterol  (SYMBICORT ,BREYNA ) 160-4.5 MCG/ACT inhaler, Inhale 2 puffs into the lungs in the morning and 2 puffs before bedtime. Use 1 puff prn during the day. Rinse mouth after use., Disp: 1 each, Rfl: 5  levothyroxine  (SYNTHROID ) 50 MCG tablet, Take 1 tablet by mouth every morning before breakfast, Disp: 90 tablet, Rfl: 3  ferrous sulfate  325 (65 FE) MG tablet, Take 1 tablet by mouth every other day, Disp: 15 tablet, Rfl: 11  omeprazole  (PRILOSEC) 40 MG capsule, Take 1 capsule by mouth daily, Disp: 90 capsule, Rfl: 3  cholecalciferol  (VITAMIN D3) 2000 UNIT tablet, Take 1 tablet by mouth in the morning and 1 tablet before bedtime. (Patient not taking: Reported on 09/14/2023), Disp: 180 tablet, Rfl: 3  calcium  citrate-citamin D (CALCIUM  CITRATE +) 315-5 MG-MCG tablet, Take 1 tablet by mouth in the morning and 1 tablet before bedtime., Disp: 180 tablet, Rfl: 3  OTHER MEDICATION, Use 1 each As Directed daily Blood Pressure Monitoring Kit   Arm Circumference LARGE  For use as directed: Please Dispense AMA validated cuff see [email or web address] (such as [employer])       DX: hypertension  ICD10: I10, Disp: 1 each, Rfl: 0  Blood Pressure KIT, Use 1 kit As Directed daily before breakfast Regular ICD i10, Disp: 1 kit, Rfl: 0    No current facility-administered medications on file prior to visit.      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   No tob, eoth, drugs     Family History:  Son - needs heart surgery, ?  CABG for cad, in [place]   Daughter -SVT ablation    ROS: All other systems were pertinently reviewed and were negative unless otherwise outlined above.    PHYSICAL EXAM:  General: Well built; normal appearance for age; no apparent distress  03/07/24  0933   BP: 153/63   Site: Left Arm   Position: Sitting   Cuff Size: Regular   Pulse: 70   Temp: 96.8 F (36 C)   TempSrc: Temporal   SpO2: 96%   Weight: 86.2 kg (190 lb)     HEENT: Oral mucosa is moist, no icterus, no pallor noted.  NECK: Supple. No thyromegaly.  CHEST: Clear to auscultation, no crackles or rhonchi. No wheezes.  HEART: Normal JVP. Carotid pulse are +2 bilaterally with normal upstrokes and no bruits. Regular rate and rhythm. No murmurs, rubs, or gallops.   ABDOMEN: Soft, no organomegaly detected. No abdominal bruits.  EXTREMITIES: Warm and well perfused. Peripheral pulses are +2 bilaterally in both upper and lower extremities. There is no evidence of peripheral pedal edema.    PSYCHIATRIC: Mood and affect are appropriate.  SKIN: No rash observed.    PERTINENT INVESTIGATIONS:    1. EKG: (on my personal review)   2. LABS:   Lab Results   Component Value Date    NA 139 01/11/2024    K 3.6 01/11/2024    CL 97 (L) 01/11/2024    CO2 30 01/11/2024    BUN 21 (H) 01/11/2024    CREAT 1.6 (H) 01/11/2024    GLUCOSER 111 01/11/2024     Lab Results   Component Value Date    LDL 80 12/10/2023    HDL 39 (L) 12/10/2023    TG 140 12/10/2023     NT-proBNP (pg/mL)   Date Value   12/10/2023 210 (H)     No results found for: BNP    EKG:  10/26/23 nsr, lvh in avl. Qrs 104. Minor st abnormalities lateral leads. Similar to prior.      Holter 03/17/2019  Sinus at 70 with range 52 to 100 BPM.   Approximately 600 APC's = 0.6% of all beats, including 15 couplets and 7 short relatively slow runs.   Approximately 600 VPC's = 0.6% of all beats,  including 2 couplets but no runs.   No symptoms.      Holter 10/08/20  Sinus at 79 with range 54 to 121 BPM.   140 APC's including 4 coupletes and one 5-beat run at 130 BPM.    500 VPC's = 0.5% fo all beats, with no couplets or runs.   No symptoms.      Lexiscan  stress 02/26/21  Up to 1 mm of flat ST depression developed in the inferolateral leads after Lexiscan  infusion.  There was no concomitant chest pain   Noinfarct or ischemia.  Normal cardiac wall motion and ejection fraction     01/19/23  Right - The renal artery has evidence of 60-99% stenosis. RI values are elevated indicating intrinsic disease. Kidney length is normal. Renal vein is patent.  Left - The renal artery has evidence of 60-99% stenosis. RI values are elevated indicating intrinsic disease. Kidney length is normal. Renal vein is patent.  Note: SMA has evidence of elevated velocities indicating greater than 70% stenosis.     Echo 01/19/23  1. LVEF 60% .  2.   no regional wall motion abnormalities.  3. grade II diastolic dysfunction.  4. Left Atrium: Moderately dilated.  5. Aortic Valve:  mild sclerosis; no stenosis.  mild aortic regurgitation.  6.  RVSP  30 mmHg which is normal.  7.  no pericardial effusion     Zio 5/25  12 days 20 hours of monitoring after artifact removed  - Predominant sinus rhythm.  Heart rate in sinus 55-122, average 72 bpm  - There were 320 runs of SVT.  The fastest SVT was 6 beats with a maximum rate of 194.  The longest SVT was 8.1 seconds with an average rate of 115 bpm.  Some SVT is atrial tachycardia with variable block  - Atrial fibrillation occurred 2% of the time with heart rates in atrial fibrillation 71-188, average 104.  The longest episode of A-fib was 23 minutes 11 seconds with an average heart rate of 120 bpm.  There were 212 runs of afib and 8 episodes that were 6 minutes or longer.   - Isolated PACs comprise 1.8% of beats.  PAC couplets and triplets were rare  - Isolated PVCs were infrequent.  PVC couplets were  rare.  No ventricular runs  - There were no patient events     Stress 11/23/23  Lexiscan   Abnormal ecg with occasional PVCs  There is a small fixed defect of the anterior and anteroseptal wall. Breast artifact is also possible     Card[facility] at [facility] 12/22/23  Severe co-dominant RCA and distal LCx bifurcation disease. Elevated LVEDP.   Patient with anterior/anteroaplical ischemia on stress and primary complaint of dyspnea. LVEDP is marginally elevated and has small branch disease in distal Lcx which may require bifurcation stenting and co-dominant RCA disease   LAD - mid to distal LAD 60%  LCX mid 70%, OM 2 70%  RCA ostial 90%, prox-mid RCA 70%    Echo 02/17/24:  Conclusions: (click link below for full report)  1. Left Ventricle: There is mild concentric hypertrophy.  2. Left Ventricle: Global systolic function: EF is estimated visually at 55% .  3. Left Ventricle: Regional systolic function: Wall motion: There are no regional wall motion abnormalities.  4. Left Ventricle: Diastolic function: There is grade III diastolic dysfunction. The LV end diastolic pressure is elevated.  5. Left Atrium: Severely dilated by volume measuring 49 ml/m.  6. Aortic Valve: There is mild sclerosis; no stenosis.  7. Tricuspid Valve: The RVSP is estimated at 32 mmHg which is normal.  8. Aorta: Normal aortic root measuring: 3.9 cm Dilated ascending aorta measuring 4 cm [2.2-3.6 cm]. Ascending aorta Index is 2.24 cm/m (Normal upper limits- Female: 1.9 cm/m2; Female: 2.2 cm/m2).  9. As compared to the previous echocardiogram on 01/19/2023 - aorta measures larger today and diastolic function has worsened.    Methacholine  challenge 02/22/24:   SUMMARY: This patient underwent bronchoprovation testing by  methacholine  challenge. The PC20, which is the provocative  concentration of methacholine  needed to produce a 20% reduction in  the FEV1, was calculated by interpolation to be 2.28  INTERPRETATION: This result is consistent with  airway  hyperresponsiveness.    ASSESSMENT: Pt is a 68yo female with a PMH of HTN, CKD 3, RAS, low burden of PAC and PVCS on holter, HL, overweight/obesity, AF on event monitor 10/2023 started on eliquis , and severe CAD w/ cath 12/2023 showing RCA and LCX disease (not revascularized), who presents today with her daughters for follow up.     PLAN:  1. Coronary artery disease of native artery of native heart with stable angina pectoris (HCC)  - LIPID PANEL; Future  - REFERRAL TO CARDIAC CATHETERIZATION (EXT)  2. Dyspnea on exertion    Pt w/ large constellation of symptoms and very difficult to discern what is CAD vs CHF vs anxiety vs GERD/gastritis. Given ongoing DOE, chest pain (Atypical) and sharp R arm pain along w/ debilitating anxiety  surrounding this, would suggest sending back for PCI for RCA and LCX disease -- this way we can rule this out as contributor to sx.   - Imdur  did not help sx -- stop this.  Increase Amlodipine /Valsartan  back up to a full tablet   - After the visit, I called and discussed this w/ Dr Burnetta who agrees w/ PCI. I called daughter Dusty [phone]) who agrees with plan.   - Continue BB, statin, Eliquis  (no ASA given a/c)   - Emailed Dr. Brandon who did the first cath at [facility]   - Form filed w/ BI  - Labs ordered pre-procedural  - OK to hold Eliquis  48 hrs prior to procedure without bridging     3. PAF (paroxysmal atrial fibrillation) (HCC)  Event monitor 10/2023: Atrial fibrillation occurred 2% of the time with heart rates in atrial fibrillation 71-188, average 104. The longest episode of A-fib was 23 minutes 11 seconds with an average heart rate of 120 bpm. There were 212 runs of afib and 8 episodes that were 6 minutes or longer.   - Plan to continue Metoprolol  and Eliquis      4. Chronic heart failure with preserved ejection fraction (HCC)  Sx do not seem c/w CHF. Remains on Torsemide  20mg  -- again with no change in sx.     5. Essential hypertension  - amlodipine -valsartan  (EXFORGE )  10-320 MG per tablet; Take 1 tablet by mouth daily  Dispense: 90 tablet; Refill: 3    Elevated today but pt quite upset. Review of BP log that daughters bring in show well controlled numbers, all <120 SBP. Possibly an element of white coat HTN.     6. Mixed hyperlipidemia  - LIPID PANEL; Future    LDL goal of <70. Needs to repeat Lipid Panel - ordered today. Last LDL 11/2023 was 80.     7. Hx of chronic gastritis  8. Abdominal discomfort  EGD 04/2023 - Patchy mild inflammation entire stomach. Biopsy showing mild gastritis and intestinal metaplasia. No H.Pylori. Remains on both Omeprazole  40mg  AM and Pepcid  40mg  PM.   - Suggest patient f/u with PCP to review abdominal symptoms and possibly ongoing GERD sx. May benefit from increased dose of PPI.   - Additionally, all of the daughters have asked me about a hiatal hernia. There is a mention of this on her CT Chest 8/28 - Tiny hiatal hernia.  Advised to f/u with PCP to address this.     9. Anxiety  Severe anxiety. Pt is very upset that psychiatrist reduced Ativan  dose. Remains on Lexapro . To be addressed by psychiatry and PCP team.     [place] interpreter used for duration of visit which was >40 minutes. Extensive chart review done prior to visit including review of recent testing coronary angiogram, echocardiogram, labs and medication review. Discussion w/ attending cardiologist regarding this case. I also spoke with 3 of this patient's daughters regarding plan of care including a phone call after this visit. Additional time spent coordinating care with outside hospital for future testing.     Return for follow up in Cardiology Clinic in 3 months or sooner. To contact earlier if there are any cardiac related issues.  I spent a total of 50 minutes on this visit on the date of service (total time includes all activities performed on the date of service)    This Cardiology Division patient encounter note was created using voice-recognition software and in real time  during the clinic visit. Please excuse any typographical  errors that have not been edited out.     Electronically signed by: Damien Etienne, PA-C, 03/07/2024 9:37 AM

## 2024-03-13 NOTE — OR Nursing (Signed)
 Called and spoke w/ pt's daughter via [place] interpreter to review plans to hold eliquis  x 6 doses ahead of upcoming cath on 03/22/24. She takes eliquis  for afib eGFR 45. Last dose will be 9/28 AM. Pt's daughter verbalized understanding of plan.

## 2024-03-14 ENCOUNTER — Encounter (HOSPITAL_BASED_OUTPATIENT_CLINIC_OR_DEPARTMENT_OTHER): Payer: Self-pay | Admitting: Cardiovascular Disease

## 2024-03-14 ENCOUNTER — Telehealth (HOSPITAL_BASED_OUTPATIENT_CLINIC_OR_DEPARTMENT_OTHER): Payer: Self-pay

## 2024-03-14 ENCOUNTER — Ambulatory Visit
Admission: RE | Admit: 2024-03-14 | Discharge: 2024-03-14 | Disposition: A | Discharge status: Home / Self Care | Attending: Cardiovascular Disease | Admitting: Cardiovascular Disease

## 2024-03-14 ENCOUNTER — Other Ambulatory Visit (LOCAL_COMMUNITY_HEALTH_CENTER): Payer: Self-pay | Admitting: Psychiatry - General

## 2024-03-14 ENCOUNTER — Ambulatory Visit (HOSPITAL_BASED_OUTPATIENT_CLINIC_OR_DEPARTMENT_OTHER): Payer: Self-pay

## 2024-03-14 ENCOUNTER — Other Ambulatory Visit: Payer: Self-pay

## 2024-03-14 ENCOUNTER — Encounter (LOCAL_COMMUNITY_HEALTH_CENTER): Payer: Self-pay

## 2024-03-14 ENCOUNTER — Encounter (HOSPITAL_BASED_OUTPATIENT_CLINIC_OR_DEPARTMENT_OTHER): Payer: Self-pay

## 2024-03-14 DIAGNOSIS — I150 Renovascular hypertension: Secondary | ICD-10-CM | POA: Insufficient documentation

## 2024-03-14 DIAGNOSIS — N1831 Chronic kidney disease, stage 3a: Secondary | ICD-10-CM | POA: Diagnosis present

## 2024-03-14 DIAGNOSIS — R55 Syncope and collapse: Secondary | ICD-10-CM | POA: Insufficient documentation

## 2024-03-14 DIAGNOSIS — R079 Chest pain, unspecified: Secondary | ICD-10-CM | POA: Insufficient documentation

## 2024-03-14 DIAGNOSIS — E782 Mixed hyperlipidemia: Secondary | ICD-10-CM | POA: Insufficient documentation

## 2024-03-14 DIAGNOSIS — F411 Generalized anxiety disorder: Secondary | ICD-10-CM

## 2024-03-14 DIAGNOSIS — I25118 Atherosclerotic heart disease of native coronary artery with other forms of angina pectoris: Secondary | ICD-10-CM | POA: Diagnosis present

## 2024-03-14 LAB — CBC WITH PLATELET
ABSOLUTE NRBC COUNT: 0 TH/uL (ref 0.0–0.0)
HEMATOCRIT: 31.4 % — ABNORMAL LOW (ref 34.1–44.9)
HEMOGLOBIN: 10.3 g/dL — ABNORMAL LOW (ref 11.2–15.7)
MEAN CORP HGB CONC: 32.8 g/dL (ref 31.0–37.0)
MEAN CORPUSCULAR HGB: 28.3 pg (ref 26.0–34.0)
MEAN CORPUSCULAR VOL: 86.3 fl (ref 80.0–100.0)
MEAN PLATELET VOLUME: 10.4 fL (ref 8.7–12.5)
NRBC %: 0 % (ref 0.0–0.0)
PLATELET COUNT: 255 TH/uL (ref 150–400)
RBC DISTRIBUTION WIDTH STD DEV: 41.1 fL (ref 35.1–46.3)
RED BLOOD CELL COUNT: 3.64 M/uL — ABNORMAL LOW (ref 3.90–5.20)
WHITE BLOOD CELL COUNT: 5.6 TH/uL (ref 4.0–11.0)

## 2024-03-14 LAB — LIPID PANEL
Cholesterol: 162 mg/dL (ref 0–239)
HIGH DENSITY LIPOPROTEIN: 42 mg/dL (ref 40–60)
LOW DENSITY LIPOPROTEIN DIRECT: 105 mg/dL (ref 0–189)
TRIGLYCERIDES: 149 mg/dL (ref 0–150)

## 2024-03-14 LAB — BASIC METABOLIC PANEL
ANION GAP: 11 mmol/L (ref 10–22)
BUN (UREA NITROGEN): 27 mg/dL — ABNORMAL HIGH (ref 7–18)
CALCIUM: 9.1 mg/dL (ref 8.5–10.5)
CARBON DIOXIDE: 28 mmol/L (ref 21–32)
CHLORIDE: 100 mmol/L (ref 98–107)
CREATININE: 1.4 mg/dL — ABNORMAL HIGH (ref 0.4–1.2)
ESTIMATED GLOMERULAR FILT RATE: 40 mL/min — ABNORMAL LOW (ref >60)
Glucose Random: 109 mg/dL (ref 74–160)
POTASSIUM: 3.7 mmol/L (ref 3.5–5.1)
SODIUM: 139 mmol/L (ref 136–145)

## 2024-03-14 MED ORDER — LORAZEPAM 0.5 MG PO TABS
0.5000 mg | ORAL_TABLET | Freq: Every day | ORAL | 3 refills | Status: DC
Start: 2024-03-14 — End: 2024-03-27

## 2024-03-14 NOTE — Telephone Encounter (Signed)
 Called patient, spoke to daughter [place] via telephone interpreter [place] [phone]) reviewing results and recommendation as noted below. [name] confirms patient has been taking Atorvastatin  and Ezetimibe  as prescribed. Per [name], patient has been experiencing a bitter taste in her mouth after taking Atorvastatin . [name] reports patient was advised by Dr. Burnetta to stop Ezetimibe  for a couple of weeks to see if bitterness resolved but it continued. Patient restarted Ezetimibe  about 1 month ago.      Minakin, Emily, PA-C to Raval, Pooja R, MD  Cms Nurses Pool  Burnetta Geni CROME, MD   03/14/24 11:14 AM     Please confirm that pt is taking both Lipitor 80mg  and Zetia . Cholesterol looks above goal.     Additionally, her H&H is low. Stable enough to go for cath but she should see her PCP team to evaluate this and see if GI referral is warranted.     Had recent EGD/Colo ? Gastritis.  Possibly also r/t CKD.     Cc'ing PCP team and cardiologist Dr Burnetta

## 2024-03-14 NOTE — Progress Notes (Signed)
 Am covering for [employer], who is out on leave until 11/6.    FYI'd scheduling, [facility], and PCP that this pt of Sharon's has not been engaging. Reena is now out on leave until 11/6. Last MyChart message indicated that Reena was encouraging pt to make apt, which she did not. Also had geri consult expressing concerns about her rx. Sent 67m supply in 7d increments w/ note on sig to call to book apt.     Pt should be routed to [facility] for bridge apt if she calls to try to book and then she can be rebooked with Reena or possibly one of the new PMH docs if she wants/needs to be seen sooner.      -------------  Olam Fragmin, MD MPH  Department of Psychiatry

## 2024-03-14 NOTE — Telephone Encounter (Signed)
 Med on file with Pharmacy:   at [facility] confirmed that Zetia  has  active refills remaining. No refill is required at this time.    confir

## 2024-03-14 NOTE — Telephone Encounter (Signed)
 PER who is calling: Patient (self), Sara Snyder is a 68 year old female has requested a refill of       Lorazepam  0.5 mg        Only complete for controlled medication:    Previously prescribed:  Start Date 02/15/24         End Date 03/16/24             Last Office Visit  : Visit date not found      Last Tele Visit:  02/29/2024      Last Physical Exam:  06/13/2020    PAP SMEAR due on 04/09/2024    Other Med Adult:  Most Recent BP Reading(s)  03/07/24 : 153/63        Cholesterol (mg/dL)   Date Value   90/76/7974 162     LOW DENSITY LIPOPROTEIN DIRECT (mg/dL)   Date Value   90/76/7974 105     HIGH DENSITY LIPOPROTEIN (mg/dL)   Date Value   90/76/7974 42     TRIGLYCERIDES (mg/dL)   Date Value   90/76/7974 149         THYROID  SCREEN TSH REFLEX FT4 (uIU/mL)   Date Value   10/20/2023 3.430         TSH (THYROID  STIM HORMONE) (uIU/mL)   Date Value   01/14/2021 7.090 (H)       HEMOGLOBIN A1C (%)   Date Value   12/03/2022 6.9 (H)       POC HEMOGLOBIN A1C (%)   Date Value   10/20/2023 6.5 (H)         INR (no units)   Date Value   12/10/2023 1.8       SODIUM (mmol/L)   Date Value   03/14/2024 139       POTASSIUM (mmol/L)   Date Value   03/14/2024 3.7           CREATININE (mg/dL)   Date Value   90/76/7974 1.4 (H)       Documented patient preferred pharmacies:    [facility] [place], [place] - [address]  Phone: [phone] Fax: [phone]

## 2024-03-16 ENCOUNTER — Encounter (LOCAL_COMMUNITY_HEALTH_CENTER): Payer: Self-pay | Admitting: Psychiatry - General

## 2024-03-16 ENCOUNTER — Encounter (HOSPITAL_BASED_OUTPATIENT_CLINIC_OR_DEPARTMENT_OTHER): Payer: Self-pay

## 2024-03-16 NOTE — Telephone Encounter (Signed)
 Support sstaff pool    Please book appointment with Edsel Farrier, PA or me (first available, likely with Danielle)

## 2024-03-24 ENCOUNTER — Ambulatory Visit

## 2024-03-24 DIAGNOSIS — N1831 Chronic kidney disease, stage 3a: Secondary | ICD-10-CM | POA: Diagnosis present

## 2024-03-24 DIAGNOSIS — I2510 Atherosclerotic heart disease of native coronary artery without angina pectoris: Secondary | ICD-10-CM | POA: Diagnosis present

## 2024-03-24 DIAGNOSIS — I209 Angina pectoris, unspecified: Secondary | ICD-10-CM | POA: Insufficient documentation

## 2024-03-24 DIAGNOSIS — Z955 Presence of coronary angioplasty implant and graft: Secondary | ICD-10-CM | POA: Insufficient documentation

## 2024-03-24 DIAGNOSIS — D649 Anemia, unspecified: Secondary | ICD-10-CM | POA: Insufficient documentation

## 2024-03-24 DIAGNOSIS — K219 Gastro-esophageal reflux disease without esophagitis: Secondary | ICD-10-CM | POA: Diagnosis present

## 2024-03-24 NOTE — Progress Notes (Unsigned)
 TELEVISIT    Interpreter: A [place] telephone interpreter was used today.     SUBJECTIVE  Chief Complaint: ***    History of Present Illness: Patient Sara Snyder is a 68 year old female with PMHx of below who presents via tele-visit for:    The patient verbally consented to an audio recording of their visit to assist with the completion of documentation. The patient is aware the recording is not retained after the visit is summarized.    With daughter Sara Snyder   History of Present Illness      Medications:  Continue all of your medications with the following changes:  - CONTINUE: Apixaban  5 mg twice daily  - START: Aspirin 81mg  daily for one week then discontinue  - START: Clopidogrel (Plavix) 75mg  daily. You should take this for a minimum of one year and ONLY stop when told by a cardiologist specifically.   - STOP: Omeprazole  (Prilosec) due to potential drug interaction with Plavix. You will replace it with Pantoprazole 40mg  daily for the duration of your Plavix therapy. If/when you stop Plavix in the future, you may switch back to Omeprazole .  - START: Pantoprazole (Protonix) 40mg  once daily.       # ***  -       Review of Systems: Pertinent positives and negatives per HPI.    PMH:  Patient Active Problem List:     Essential hypertension     Labyrinthitis     Panic disorder     Hyperlipidemia     Mild episode of recurrent major depressive disorder (HCC)     Allergic rhinitis     Chronic pain of both shoulders     Stage 3a chronic kidney disease     Anxiety state     Psychophysiological insomnia     Secondary hyperparathyroidism (HCC)     Osteopenia     Carpal tunnel syndrome of right wrist     Fibromyalgia     Hair loss     Vitamin D  deficiency     Obesity (BMI 30-39.9)     Bilateral renal artery stenosis (HCC)     DOE (dyspnea on exertion)     Recurrent epistaxis     Gastroesophageal reflux disease without esophagitis     Other specified hypothyroidism     Combined forms of age-related cataract of right eye      Cervical cancer screening     Prediabetes     Healthcare maintenance     Schistosomiasis     Intraoperative floppy iris syndrome (IFIS)     Combined forms of age-related cataract of left eye     Varicose veins of calf     Chronic bilateral low back pain without sciatica     Morbid obesity with BMI 40.0-44.9, adult (HCC)     Abnormal mammogram     Tubular adenoma of colon     Angina pectoris  [facility])     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 incontinence     Gastritis without bleeding     Atrial fibrillation with RVR (HCC)      Past Surgical History:  No date: CATARACT REMOVAL INSERTION OF LENS; Right  10/2016: HEMORRHOID SURGERY  No date: OB ANTEPARTUM CARE CESAREAN DLVR & POSTPARTUM    Social History    Tobacco Use      Smoking status: Former        Packs/day: 0.00        Years: 2.0 packs/day for 0.3 years (0.7 ttl pk-yrs)  Types: Cigarettes        Start date: 62        Quit date: 10/27/1977        Years since quitting: 46.4      Smokeless tobacco: Never    Alcohol use: Not Currently      No family history on file.        Current Outpatient Medications:     LORazepam  (ATIVAN ) 0.5 MG tablet, Take 1 tablet by mouth daily Please call to make apt for future refills: [phone]., Disp: 7 tablet, Rfl: 3    amlodipine -valsartan  (EXFORGE ) 10-320 MG per tablet, Take 1 tablet by mouth daily, Disp: 90 tablet, Rfl: 3    famotidine  (PEPCID ) 40 MG tablet, Take 1 tablet by mouth at bedtime, Disp: 90 tablet, Rfl: 0    escitalopram  (LEXAPRO ) 10 MG tablet, Take 1 tablet by mouth daily With breakfast, Disp: 30 tablet, Rfl: 2    nitroGLYCERIN  (NITROSTAT ) 0.4 MG sublingual tablet, Place 1 tablet under the tongue every 5 (five) minutes as needed for Chest pain, Disp: 25 tablet, Rfl: 0    ezetimibe  (ZETIA ) 10 MG tablet, Take 1 tablet by mouth daily, Disp: 90 tablet, Rfl: 3    atorvastatin  (LIPITOR) 80 MG tablet, Take 1 tablet by mouth daily, Disp: 90 tablet, Rfl: 3    Bimatoprost  (LUMIGAN ) 0.01 % SOLN, Place 1 drop into both eyes nightly, Disp: 2.5 mL,  Rfl: 11    gabapentin  (NEURONTIN ) 100 MG capsule, Take 1 capsule by mouth nightly, Disp: 30 capsule, Rfl: 0    LORazepam  (ATIVAN ) 0.5 MG tablet, Take 0.5 mg by mouth every 6 (six) hours as needed for Anxiety, Disp: , Rfl:     apixaban  (ELIQUIS ) 5 MG po tablet, Take 1 tablet by mouth in the morning and 1 tablet before bedtime., Disp: 90 tablet, Rfl: 3    torsemide  (DEMADEX ) 10 MG tablet, Take 2 tablets by mouth daily, Disp: 180 tablet, Rfl: 1    metoprolol  (TOPROL -XL) 50 MG 24 hr tablet, Take 1 tablet by mouth daily, Disp: 90 tablet, Rfl: 3    budesonide -formoterol  (SYMBICORT ,BREYNA ) 160-4.5 MCG/ACT inhaler, Inhale 2 puffs into the lungs in the morning and 2 puffs before bedtime. Use 1 puff prn during the day. Rinse mouth after use., Disp: 1 each, Rfl: 5    levothyroxine  (SYNTHROID ) 50 MCG tablet, Take 1 tablet by mouth every morning before breakfast, Disp: 90 tablet, Rfl: 3    ferrous sulfate  325 (65 FE) MG tablet, Take 1 tablet by mouth every other day, Disp: 15 tablet, Rfl: 11    omeprazole  (PRILOSEC) 40 MG capsule, Take 1 capsule by mouth daily, Disp: 90 capsule, Rfl: 3    cholecalciferol  (VITAMIN D3) 2000 UNIT tablet, Take 1 tablet by mouth in the morning and 1 tablet before bedtime. (Patient not taking: Reported on 09/14/2023), Disp: 180 tablet, Rfl: 3    calcium  citrate-citamin D (CALCIUM  CITRATE +) 315-5 MG-MCG tablet, Take 1 tablet by mouth in the morning and 1 tablet before bedtime., Disp: 180 tablet, Rfl: 3    OTHER MEDICATION, Use 1 each As Directed daily Blood Pressure Monitoring Kit  Arm Circumference LARGE For use as directed: Please Dispense AMA validated cuff see [email or web address] (such as [employer])    DX: hypertension ICD10: I10, Disp: 1 each, Rfl: 0    Blood Pressure KIT, Use 1 kit As Directed daily before breakfast Regular ICD i10, Disp: 1 kit, Rfl: 0    Review of Patient's Allergies indicates:  Pollen extract          Cough    OBJECTIVE  There were no vitals taken for this visit. -  Tele-visit.    Physical Exam:   Tele-visit - Patient sounds comfortable on the phone, speaking in full and complete sentences. Without shortness of breath, coughing, or audible wheezing.       ASSESSMENT/PLAN    Assessment & Plan        ***diagmed      Follow-Up:     Precautions given, all questions answered.   Discussed risks/benefits, SE of medications.   Discussed signs/symptoms to prompt return to care or to seek care in ER/call 911.      ***CC Chart: PCP, Raval, Pooja R, MD    {Document time spent to support E&M coding:12984}    Edsel Farrier, PA-C

## 2024-03-27 ENCOUNTER — Other Ambulatory Visit: Payer: Self-pay

## 2024-03-27 ENCOUNTER — Ambulatory Visit

## 2024-03-27 ENCOUNTER — Telehealth (HOSPITAL_BASED_OUTPATIENT_CLINIC_OR_DEPARTMENT_OTHER): Payer: Self-pay

## 2024-03-27 ENCOUNTER — Telehealth (LOCAL_COMMUNITY_HEALTH_CENTER): Payer: Self-pay

## 2024-03-27 DIAGNOSIS — N1831 Chronic kidney disease, stage 3a: Secondary | ICD-10-CM | POA: Diagnosis present

## 2024-03-27 DIAGNOSIS — Z955 Presence of coronary angioplasty implant and graft: Secondary | ICD-10-CM | POA: Insufficient documentation

## 2024-03-27 DIAGNOSIS — D649 Anemia, unspecified: Secondary | ICD-10-CM | POA: Diagnosis present

## 2024-03-27 DIAGNOSIS — F411 Generalized anxiety disorder: Secondary | ICD-10-CM

## 2024-03-27 DIAGNOSIS — Z79899 Other long term (current) drug therapy: Secondary | ICD-10-CM

## 2024-03-27 LAB — BASIC METABOLIC PANEL
ANION GAP: 12 mmol/L (ref 10–22)
BUN (UREA NITROGEN): 23 mg/dL — ABNORMAL HIGH (ref 7–18)
CALCIUM: 9.3 mg/dL (ref 8.5–10.5)
CARBON DIOXIDE: 30 mmol/L (ref 21–32)
CHLORIDE: 99 mmol/L (ref 98–107)
CREATININE: 1.6 mg/dL — ABNORMAL HIGH (ref 0.4–1.2)
ESTIMATED GLOMERULAR FILT RATE: 35 mL/min — ABNORMAL LOW (ref >60)
Glucose Random: 103 mg/dL (ref 74–160)
POTASSIUM: 3.7 mmol/L (ref 3.5–5.1)
SODIUM: 141 mmol/L (ref 136–145)

## 2024-03-27 LAB — IRON: IRON: 45 ug/dL — ABNORMAL LOW (ref 50–170)

## 2024-03-27 LAB — TOTAL IRON BINDING CAPACITY: TOTAL IRON BIND CAPACITY CALC: 416 ug/dL (ref 280–504)

## 2024-03-27 LAB — FERRITIN: FERRITIN: 23 ng/mL (ref 13–150)

## 2024-03-27 MED ORDER — LORAZEPAM 0.5 MG PO TABS
0.5000 mg | ORAL_TABLET | Freq: Two times a day (BID) | ORAL | 0 refills | Status: DC
Start: 2024-03-27 — End: 2024-04-03

## 2024-03-27 NOTE — Progress Notes (Signed)
 Labs drawn

## 2024-03-27 NOTE — Telephone Encounter (Addendum)
 Received a message from Dr. Olam Fragmin in regards to [name]. This Clinical research associate called HCP/daughter Sara Snyder to clarify the situation.     Due to the language barrier, the phone call was conducted in [place] with an interpreter. The interpreter's name is Chrystine [phone]. The interpreter was on the phone during the entire phone call.    - Per Ms. Richardine, NP [facility] requested to decrease lorazepam  but Sara Snyder felt there was no clear communication/discussion around this with pt or HCP/daughter  - Since then Sara Snyder has not been able to obtain refills and has not been able to connect with NP [facility] to discuss tx further  - Has been out of lorazepam  for a week, PDMP reviewed  - Tried gabapentin , got dizzy so stopped after 5 days   - No recent Hx of falls, dizziness  - Not great but doing relatively okay after switching from clonazepam  to lorazepam  (clonazepam  2mg  daily total --> loarzepam 1mg  daily total in 08/2023)  - Med adherence: still takes meds on her own, but daughter helps - daughter organizes meds for TID dosing every day, so the risk of accidental OD does not seem high  - No VNA has been set up  - Anxiety is quite bad, agitated, crying, s/p stent in the heart, no notable worsening of confusion   - No safety concerns, no psychosis     Plan:  - Will write a script for lorazepam  0.5mg  BID for now; on chart review and per collateral, she has been on a very difficult taper schedule, will likely benefit from a more gradual taper given she has been on benzo for years  - Video appt 03/29/2024 at 3PM with this writer to evaluate and discuss care  - Neuropsych testing on 04/04/2024 for diagnostic clarification    Ernst Lown, MD

## 2024-03-27 NOTE — Progress Notes (Unsigned)
 GERIATRIC PSYCHIATRY CONSULT FOLLOW-UP NOTE    LANGUAGE OF CARE:    [place] ([place]      LANGUAGE NEEDS MET:    Telephone Interpreter    CHIEF COMPLAINT: I'm doing good    SUBJECTIVE DATA/INTERVAL HISTORY:   At the last visit on 01/18/2024 (consult), she presented with gradual decline in cognition and function over years with worsening anxiety. Hx of long-term tx with benzodiazepine for anxiety, PTSD with Hx of childhood trauma. Agreed with a trial of gabapentin , neuropsych testing (scheduled), minimize anticholinergic agents, VNA for med adherence. Plan was to f/u with psych provider Ms. Reena Faster, APRN as Ms. Lauderbaugh expressed preference to continue working with Ms. Faster.     Since the last evaluation:  - See my tel encounter with daughter; had notable difficulties communicating with psych provider and get refills for lorazepam   - No VNA but daughter/HCP sets up meds for the day  - Per daughter, she has been doing better after cardiac cath but still quite anxious, calling her daughter at work, 2-3 a week  - Taking lorazepam  0.5mg  qhs and sometimes daily PRN, seemed to be doing ok with this regimen   - Has not picked up lorazepam  (taking a lot of time for [facility] pharmacy to ship it to her); requested to send the script to [facility] in [place] on [address]    Depression: mood is okay, energy is so so, continues to struggle with memory, appetite is good, no SI   GDS - NA         01/18/2024     9:58 AM 09/14/2023     9:50 AM 12/03/2022     4:17 PM   PHQ-9 TOTAL SCORE   Doc FlowSheet Total Score 18  4  8    MyChart Total Score   8       Patient-reported     Mania/Hypomania: denied and not demonstrating s/sx  Anxiety: 6 out of 10, at times higher than 10, calling her daughter at work, overall improving after cardiac cath         01/18/2024     9:58 AM 09/14/2023     9:50 AM 02/16/2019    10:36 AM 11/17/2017    12:15 PM   GAD-7 Total   GAD-7 Score 20  6  11 21        Patient-reported     OCD:  denied   Psychosis: denied  Substance Use: denied  Safety:    HI - denied   SI - denied  Medications:   Adherence - daughter helps prep meds for the day   Side effects - denied   PRN - NA  Functional Change: no    OBJECTIVE DATA:  CURRENT MEDICATIONS:    Current Outpatient Medications   Medication Sig    LORazepam  (ATIVAN ) 0.5 MG tablet Take 1 tablet by mouth at bedtime. May also take 1 tablet daily as needed for Anxiety.    escitalopram  (LEXAPRO ) 10 MG tablet Take 1 tablet by mouth in the morning. With breakfast.    pantoprazole (PROTONIX) 40 MG tablet Take 40 mg by mouth daily    aspirin 81 MG EC tablet Take 81 mg by mouth daily For 1 week and then stop    clopidogrel (PLAVIX) 75 MG tablet Take 75 mg by mouth daily    amlodipine -valsartan  (EXFORGE ) 10-320 MG per tablet Take 1 tablet by mouth daily    famotidine  (PEPCID ) 40 MG tablet Take 1 tablet by mouth at bedtime  nitroGLYCERIN  (NITROSTAT ) 0.4 MG sublingual tablet Place 1 tablet under the tongue every 5 (five) minutes as needed for Chest pain    ezetimibe  (ZETIA ) 10 MG tablet Take 1 tablet by mouth daily    atorvastatin  (LIPITOR) 80 MG tablet Take 1 tablet by mouth daily    Bimatoprost  (LUMIGAN ) 0.01 % SOLN Place 1 drop into both eyes nightly    apixaban  (ELIQUIS ) 5 MG po tablet Take 1 tablet by mouth in the morning and 1 tablet before bedtime.    torsemide  (DEMADEX ) 10 MG tablet Take 2 tablets by mouth daily    metoprolol  (TOPROL -XL) 50 MG 24 hr tablet Take 1 tablet by mouth daily    budesonide -formoterol  (SYMBICORT ,BREYNA ) 160-4.5 MCG/ACT inhaler Inhale 2 puffs into the lungs in the morning and 2 puffs before bedtime. Use 1 puff prn during the day. Rinse mouth after use.    levothyroxine  (SYNTHROID ) 50 MCG tablet Take 1 tablet by mouth every morning before breakfast    ferrous sulfate  325 (65 FE) MG tablet Take 1 tablet by mouth every other day    cholecalciferol  (VITAMIN D3) 2000 UNIT tablet Take 1 tablet by mouth in the morning and 1 tablet before  bedtime. (Patient not taking: Reported on 09/14/2023)    calcium  citrate-citamin D (CALCIUM  CITRATE +) 315-5 MG-MCG tablet Take 1 tablet by mouth in the morning and 1 tablet before bedtime.    OTHER MEDICATION Use 1 each As Directed daily Blood Pressure Monitoring Kit   Arm Circumference LARGE  For use as directed: Please Dispense AMA validated cuff see [email or web address] (such as [employer])       DX: hypertension  ICD10: I10    Blood Pressure KIT Use 1 kit As Directed daily before breakfast Regular ICD i10     No current facility-administered medications for this visit.         VITAL SIGNS:   There were no vitals filed for this visit.    LABS:    WHITE BLOOD CELL COUNT (TH/uL)   Date Value   03/14/2024 5.6     RED BLOOD CELL COUNT (M/uL)   Date Value   03/14/2024 3.64 (L)     HEMOGLOBIN (g/dL)   Date Value   90/76/7974 10.3 (L)     No results found for: IDEALHCT  HEMATOCRIT (%)   Date Value   03/14/2024 31.4 (L)     No results found for: HCTDIFF  MEAN CORPUSCULAR VOL (fl)   Date Value   03/14/2024 86.3     MEAN CORPUSCULAR HGB (pg)   Date Value   03/14/2024 28.3     MEAN CORP HGB CONC (g/dL)   Date Value   90/76/7974 32.8     No results found for: RDW  PLATELET COUNT (TH/uL)   Date Value   03/14/2024 255     MEAN PLATELET VOLUME (fL)   Date Value   03/14/2024 10.4     NEUTROPHIL % (%)   Date Value   01/11/2024 57.2     LYMPHOCYTE % (%)   Date Value   01/11/2024 33.9     MONOCYTE % (%)   Date Value   01/11/2024 5.7     EOSINOPHIL % (%)   Date Value   01/11/2024 2.4     BASOPHIL % (%)   Date Value   01/11/2024 0.3         ALBUMIN (g/dL)   Date Value   92/77/7974 4.2     ALKALINE PHOSPHATASE (U/L)  Date Value   01/11/2024 125 (H)     ALANINE AMINOTRANSFERASE (U/L)   Date Value   01/11/2024 22     ASPARTATE AMINOTRANSFERASE (U/L)   Date Value   01/11/2024 23     Glucose Random (mg/dL)   Date Value   89/93/7974 103     BUN (UREA NITROGEN) (mg/dL)   Date Value   89/93/7974 23 (H)     CALCIUM  (mg/dL)   Date Value    89/93/7974 9.3     CHLORIDE (mmol/L)   Date Value   03/27/2024 99     CARBON DIOXIDE (mmol/L)   Date Value   03/27/2024 30     CREATININE (mg/dL)   Date Value   89/93/7974 1.6 (H)     POTASSIUM (mmol/L)   Date Value   03/27/2024 3.7     SODIUM (mmol/L)   Date Value   03/27/2024 141     BILIRUBIN TOTAL (mg/dL)   Date Value   92/77/7974 0.4     TOTAL PROTEIN (g/dL)   Date Value   92/77/7974 7.1          THYROID  SCREEN TSH REFLEX FT4 (uIU/mL)   Date Value   10/20/2023 3.430     Cholesterol (mg/dL)   Date Value   90/76/7974 162   12/10/2023 132   07/14/2021 215     LOW DENSITY LIPOPROTEIN DIRECT (mg/dL)   Date Value   90/76/7974 105   12/10/2023 80   07/14/2021 153     HIGH DENSITY LIPOPROTEIN (mg/dL)   Date Value   90/76/7974 42   12/10/2023 39 (L)   07/14/2021 44     TRIGLYCERIDES (mg/dL)   Date Value   90/76/7974 149   12/10/2023 140   06/13/2020 148         HEMOGLOBIN A1C (%)   Date Value   12/03/2022 6.9 (H)   05/25/2022 6.3 (H)   02/24/2022 6.4 (H)     POC HEMOGLOBIN A1C (%)   Date Value   10/20/2023 6.5 (H)          RPR QUAL (no units)   Date Value   07/20/2019 Non Reactive     RPR QUALITATIVE (no units)   Date Value   01/24/2020 NON-REACTIVE                CURRENT TREATMENT/CONTACT INFO FOR OTHER AGENCIES AND MENTAL HEALTH PROVIDERS (if applicable):     Contact/Community Support    No documentation.                   COLUMBIARISK ASSESSMENTS:     Suicide:  [facility] OP LAST CONTACT      Flowsheet Row BH Office Visit from 01/18/2024 in [facility]   Have you wished you were dead or wished you could go to sleep and not wake up? No   Have you actually had any thoughts of killing yourself?  No   Have you done anything, started to do anything, or prepared to do anything to end your life? No   C-SSRS OP Last Contact Screener Risk Score No Risk            C-SSRS Risk Assessment - 03/29/24 1545          C-SSRS Risk Assessment    Protective Factors (Recent) Identifies reasons for living;Responsibility to  family or others, living with family;Supportive social network or family  VIOLENCE:    Violence/Abuse Risk - 03/29/24 1546          Violence Risk    History of Violence? No     Current Attempt to Harm No     Homicidal Ideation? No     Homicidal Ideation With Intent No     Self Destructive Behaviors Denies     Self Inflicted Injury Denies     Aggression/Poor Impulse Control Denies                     ADDITIONAL SCREENINGS:       12/03/2022     4:17 PM 09/14/2023     9:50 AM 01/18/2024     9:58 AM   PHQ9 SCREENING   Little interest or pleasure in doing things Nearly Every Day Several Days Nearly Every Day   Feeling down, depressed, or hopeless Nearly Every Day Several Days Nearly Every Day   Trouble falling asleep, staying asleep, or sleeping too much Several Days Several Days Nearly Every Day   Feeling tired or having low energy Several Days Several Days Nearly Every Day   Poor appetite or overeating Not at all Not at all Not at all   Feeling bad about yourself - or that you are a failure or have let yourself or your family down Not at all Not at all Nearly Every Day   Trouble concentrating on things, such as school work, reading the newspaper, or watching television Not at all Not at all Nearly Every Day   Moving or speaking slowly that other people could have noticed.  Or the opposite - being so fidgety or restless that you have been  moving around a lot more than ususal Not at all Not at all Not at all   Thoughts that you would be better off dead or of hurting yourself in some way Not at all Not at all Not at all   PHQ-9 Total 8 4  18         Patient-reported         01/18/2024     9:58 AM 09/14/2023     9:50 AM 12/03/2022     4:17 PM   GAD-7 FLOWSHEET   Feeling Nervous/Anxious/ On Edge 3 1 3    Unable to Stop Worrying 3 1 3    Worrying Too Much about Different Things 3 1    Trouble Relaxing 3 1    Restless/Hard to Sit Still 2 0    Easily Annoyed/Irritable 3 1    Afraid As is Something Aweful 3 1    How  difficult has it made you to do work, take care of things at home, or get along with other people Very difficult Not difficult at all    GAD-7 Score 20  6         Patient-reported         12/03/2022     4:14 PM 01/11/2024    11:27 AM 02/17/2024    10:58 AM   SDOH LAST 3 VALUES   Patient is willing to answer these questions Yes No No   In the last 12 months, did you worry that your food would run out before you got money to buy more? Never     In the last 12 months, the food you bought didn't last and you didn't have money to get more Never     In the last 12 months, has the electric, gas or oil company  threatened to shut off services in your home? No     In the last 12 months, have you or your family had trouble getting transportation to medical appointments? No     What is your living situation today? I have a steady place to live.     Do you have access to the internet at home? (Select all that apply) Yes, on a computer,Yes, on my phone     In the last 12 months, have you experienced violence at home or in your relationships? No     Would you like a [facility] care team member to reach out to help you with the needs you checked off above? No     May we refer you to free or low cost community programs (like food pantries) by sharing your name, phone and address so they can reach you? No         REVIEW OF SYSTEMS:  REVIEW OF SYSTEMS (Prescribers Only):  .: All systems reviewed and negative unless noted otherwise.  Constitutional: fatigue  Musculoskeletal: pain     MENTAL STATUS EXAMINATION:   Mental Status Exam - 03/29/24 1546          Mental Status Exam     General Appearance Clean;Dressed appropriately;Dressed inappropriately to season     Behavior Cooperative;Socially appropriate     Level of Consciousness Alert     Orientation Level Oriented to place;Oriented to situation;Oriented to person     Attention/Concentration Consistent difficulty paying attention     Mannerisms/Movements No abnormal mannerisms/movements      Speech Quality and Rate WNL     Speech Clarity Clear     Speech Tone Normal vocal inflection     Vocabulary/Fund of Knowledge WNL     Memory Notable gaps in memory     Thought Process & Associations Concrete;Goal-directed     Dissociative Symptoms None     Thought Content No abnormalities reported or observed     Hallucinations None     Suicidal Thoughts None;Future oriented     Homicidal Thoughts None     Mood comment good     Affect Constricted     Judgment --   limited    Insight Poor                     RISK ASSESSMENTS:     Violence: low (1)     Addiction: low (1)     BIO/PSYCHO/SOCIAL AND RISK FORMULATION(S): 68 year old lady who presents with gradually declining cognition and function over years with worsening anxiety. On benzodiazepine for years for underlying anxiety, also reported to have PTSD with Hx of childhood trauma.     Today Ms. Letta Cargile reported overall improvement in anxiety after cardiac cath. Her anxiety was likely higher due to abrupt cessation of lorazepam . She was doing relatively okay on lorazepam  0.5mg  qhs with daily PRN. Explained risks with benzo use for her age and efforts to minimize the use. Will continue escitalopram  10mg  daily and lorazepam  0.5mg  qhs and 0.5mg  daily PRN for now and let her recover from cardiac cath. It is possible she continues to improve psychiatrically, as her cardiac health improves.     In terms of risks:   - low risk of harm to self and low risk of harm others/violence in the community at baseline based on static factors   - low imminent risk of harm to self and low imminent risk of harm others/violence based on  dynamic factors   - low risk for addiction    DIAGNOSES:     GAD (generalized anxiety disorder)  (primary encounter diagnosis)   SNOMED CT(R): GENERALIZED ANXIETY DISORDER     Long term prescription benzodiazepine use   SNOMED CT(R): LONG-TERM CURRENT USE OF BENZODIAZEPINE      RECOMMENDATIONS:   Biological:  Continue escitalopram  10mg  daily (sent  script to [facility] per HCP request); cautious with dose increase, given cardiac Hx; may have to consider alternative SSRI if a higher dose is needed  Continue lorazepam  0.5mg  qhs and 0.5mg  daily PRN (sent script to [facility] in [place] per HCP request); continue to assess SE and risks and determine the lowest effective dose; recommend slow taper given long Hx of benzo tx  Unable to tolerate gabapentin  due to dizziness, tried 100mg  for 5 days  Consider a trial of buspirone (though it may not help as she has been on benzo for years); may need to consider low-dose antipsychotics for severe anxiety with ruminations, agitation  Minimize anticholinergic meds that can worsen cognition  Medication interactions: omeprazole  can inc serum conc of escitalopram  and enhance toxic effect of escitalopram , monitor BP, CNS suppression, on multiple anticholinergics  EKG: most recent EKG shows QTc 451 HR 78 in 10/2023, repeat is pending  Labs: recommend checking TSH w reflex T4, vit D, B12, folate to r/o secondary mood/anxiety symptoms  Sleep study: recommend further evaluation to r/o non-psychiatric etiology of depressive symptoms, cognitive impairment, especially with her BMI  Agree with management of metabolic/vascular risk factors  Psychological:  Psychotherapy: defer for now  Neuropsychological testing: scheduled in 03/2024  Social:  Connected to [facility] [employer]: recommend  Connected to [employer]: consider  Legal:  Horticulturist, commercial: invoked HCP - did not seem able to retain and manipulate relevant information to make informed decision due to cognitive impairment at this time  HCP: [employer], daughter  MOLST: needs one  Follow-Up:  Strongly encouraged f/u with Ms. [facility] APRN but firmly requested a change in provider; will discuss with Dr. Olam Fragmin  Re-consult as needed    For any medication(s), patient was informed of the potential risks and benefits of the  treatment, including the option not to treat, and appeared to understand and agreed to comply. Discussion included: purpose of medications including black-box warning when applicable, provided [facility] contact number [phone], disease process, how to reach EMS if feeling at risk of harming self or others.     COUNSELING AND COORDINATION OF CARE PROVIDED:   I spent a total of 45 minutes on this visit on the date of service (total time includes all activities performed on the date of service)    PSYCHOTHERAPY PROVIDED: none    Ernst Lown, MD

## 2024-03-27 NOTE — Telephone Encounter (Addendum)
-----   Message from Edsel Farrier sent at 03/24/2024  4:49 PM EDT -----  [facility] Staff,     Please schedule patient with PCP     Reason for visit: f/u chronic conditions, abdominal pain   Time frame: 4 weeks       Please document three outreaches in a telephone encounter. If patient is not reached, send a letter in the mail please.         Thank you,  Danielle      Planned Care Outreach  Call made to [name] to schedule an appointment for f/u chronic conditions, abdominal pain . No interpreter needed.   WHO ARE YOU CALLING: Patient (self) answered at [phone] ; appointment booked.  I have completed the following communication and reminder steps: Patient notified by telephone  Zada Cleveland, 03/27/2024    On behalf of PCP: [name], MD

## 2024-03-29 ENCOUNTER — Ambulatory Visit (HOSPITAL_BASED_OUTPATIENT_CLINIC_OR_DEPARTMENT_OTHER): Payer: Self-pay

## 2024-03-29 ENCOUNTER — Ambulatory Visit

## 2024-03-29 DIAGNOSIS — F411 Generalized anxiety disorder: Secondary | ICD-10-CM | POA: Insufficient documentation

## 2024-03-29 DIAGNOSIS — N1831 Chronic kidney disease, stage 3a: Secondary | ICD-10-CM

## 2024-03-29 DIAGNOSIS — Z79899 Other long term (current) drug therapy: Secondary | ICD-10-CM | POA: Diagnosis present

## 2024-03-29 MED ORDER — ESCITALOPRAM OXALATE 10 MG PO TABS
10.0000 mg | ORAL_TABLET | Freq: Every day | ORAL | 5 refills | Status: AC
Start: 2024-03-29 — End: 2024-09-25

## 2024-03-29 MED ORDER — LORAZEPAM 0.5 MG PO TABS
ORAL_TABLET | ORAL | 0 refills | Status: AC
Start: 2024-03-29 — End: 2024-04-28

## 2024-03-29 NOTE — Telephone Encounter (Signed)
 Spoke with daughter, reubin  using a telephone interpreter  CB [phone] franscio  Relayed results and plan  She always needs more fluids, will help  Will schedule lab in one week  Will be to PCP apt in Nov  Verbalized back and agrees with plan

## 2024-03-29 NOTE — Progress Notes (Signed)
 Dear RN,    Please:    1. Create Telephone encounter for this patient.  2. Share with the patient the attached results     Iron stores are normal but serum iron is a little low. This is just the iron at one point in time. Ferritin and TIBC are in normal range though at the end of the range.   Kidney function is generally around baseline but creatinine is slightly up.     Plan:  1. Repeat kidney functions in 1 week to see the trend given recent contrast. Attn hydration.   F/u w/ PCP at upcoming visit regarding if any further iron supplementation is needed at this point.      2. Type of Outreach: 3 phone calls and if unable to reach send letter    3. Document the conversation in the Telephone Encounter and close the encounter, no need to send back to me.     Thank you,  Edsel Farrier, PA-C

## 2024-03-29 NOTE — Telephone Encounter (Signed)
-----   Message from [facility] [place] sent at 03/29/2024  7:29 AM EDT -----  Dear RN,    Please:    1. Create Telephone encounter for this patient.  2. Share with the patient the attached results     Iron stores are normal but serum iron is a little low. This is just the iron at one point in time. Ferritin and TIBC are in normal range though at the end of the range.   Kidney function is generally around baseline but creatinine is slightly up.     Plan:  1. Repeat kidney functions in 1 week to see the trend given recent contrast. Attn hydration.   F/u w/ PCP at upcoming visit regarding if any further iron supplementation is needed at this point.      2. Type of Outreach: 3 phone calls and if unable to reach send letter    3. Document the conversation in the Telephone Encounter and close the encounter, no need to send back to me.     Thank you,  Edsel Farrier, PA-C    ----- Message -----  From: Interface, Lab  Sent: 03/27/2024   2:57 PM EDT  To: Edsel Farrier, PA-C

## 2024-04-04 ENCOUNTER — Other Ambulatory Visit: Payer: Self-pay

## 2024-04-04 ENCOUNTER — Encounter (HOSPITAL_BASED_OUTPATIENT_CLINIC_OR_DEPARTMENT_OTHER): Payer: Self-pay

## 2024-04-04 ENCOUNTER — Ambulatory Visit

## 2024-04-04 DIAGNOSIS — F331 Major depressive disorder, recurrent, moderate: Secondary | ICD-10-CM | POA: Insufficient documentation

## 2024-04-04 DIAGNOSIS — F411 Generalized anxiety disorder: Secondary | ICD-10-CM | POA: Insufficient documentation

## 2024-04-04 NOTE — Progress Notes (Signed)
 NEUROPSYCHOLOGY TESTING PROGRESS NOTE       VISIT TYPE: adult neuropsychological testing and feedabck     INTERPRETER: Yvette Pinal     OBSERVER: none     REASON FOR TESTING REFERRAL: cognitive problem    SOURCE(S) OF INFORMATION: Ms. Machamer, collateral interviews with her husband, Meilyn Heindl, and daughter, Jamieka Royle.     SUBJECTIVE FINDINGS:   CHIEF COMPLAINT AND CLINICAL UPDATES    Chief Complaint: Ms. Hummel endorsed a decline in memory and thinking, noting she had previously been "very good at mathematics" and felt "sharper." She has struggled to learn English despite coursework and often deviated from questions during the interview, necessitating greater reliance on collateral data. Her husband described preserved remote memory with intermittent difficulty learning recent information (e.g., misplacing glasses, forgetting elements of recent conversations) occurring about every other week and inconsistently. Luana reported that in the past 4 years, Ms. Rommel has become slower and more easily distracted, with reduced new learning (e.g., difficulty programming the television) and forgetting details, and sometimes the gist, of recent conversations at least weekly, with gradual progression. The family denied clear episodes of acute disorientation or confusion in the past year, though Luana noted occasional confusion about the day of the week when she woke up.     2) History of presenting problem: See report for background information.      OBJECTIVE FINDINGS:   DATA REVIEWED: Epic     CURRENT MEDICATIONS:  Current Outpatient Medications   Medication Sig    LORazepam  (ATIVAN ) 0.5 MG tablet Take 1 tablet by mouth at bedtime. May also take 1 tablet daily as needed for Anxiety.    escitalopram  (LEXAPRO ) 10 MG tablet Take 1 tablet by mouth in the morning. With breakfast.    pantoprazole (PROTONIX) 40 MG tablet Take 40 mg by mouth daily    clopidogrel (PLAVIX) 75 MG tablet Take 75 mg by mouth daily     amlodipine -valsartan  (EXFORGE ) 10-320 MG per tablet Take 1 tablet by mouth daily    famotidine  (PEPCID ) 40 MG tablet Take 1 tablet by mouth at bedtime    nitroGLYCERIN  (NITROSTAT ) 0.4 MG sublingual tablet Place 1 tablet under the tongue every 5 (five) minutes as needed for Chest pain    ezetimibe  (ZETIA ) 10 MG tablet Take 1 tablet by mouth daily    atorvastatin  (LIPITOR) 80 MG tablet Take 1 tablet by mouth daily    Bimatoprost  (LUMIGAN ) 0.01 % SOLN Place 1 drop into both eyes nightly    apixaban  (ELIQUIS ) 5 MG po tablet Take 1 tablet by mouth in the morning and 1 tablet before bedtime.    torsemide  (DEMADEX ) 10 MG tablet Take 2 tablets by mouth daily    metoprolol  (TOPROL -XL) 50 MG 24 hr tablet Take 1 tablet by mouth daily    budesonide -formoterol  (SYMBICORT ,BREYNA ) 160-4.5 MCG/ACT inhaler Inhale 2 puffs into the lungs in the morning and 2 puffs before bedtime. Use 1 puff prn during the day. Rinse mouth after use.    levothyroxine  (SYNTHROID ) 50 MCG tablet Take 1 tablet by mouth every morning before breakfast    ferrous sulfate  325 (65 FE) MG tablet Take 1 tablet by mouth every other day    cholecalciferol  (VITAMIN D3) 2000 UNIT tablet Take 1 tablet by mouth in the morning and 1 tablet before bedtime. (Patient not taking: Reported on 09/14/2023)    calcium  citrate-citamin D (CALCIUM  CITRATE +) 315-5 MG-MCG tablet Take 1 tablet by mouth in the morning and 1 tablet before bedtime.  OTHER MEDICATION Use 1 each As Directed daily Blood Pressure Monitoring Kit   Arm Circumference LARGE  For use as directed: Please Dispense AMA validated cuff see [email or web address] (such as [employer])       DX: hypertension  ICD10: I10    Blood Pressure KIT Use 1 kit As Directed daily before breakfast Regular ICD i10     No current facility-administered medications for this visit.        MEDICATION ADHERENCE (including barriers and how addressed): yes, with support     INFORMED CONSENT (for any new assessment): yes     MENTAL STATUS  EXAMINATION:   Mental Status Exam - 04/04/24 1742          Mental Status Exam     General Appearance Clean     Behavior Cooperative     Level of Consciousness Alert     Orientation Level Grossly intact     Attention/Concentration WNL     Mannerisms/Movements No abnormal mannerisms/movements     Speech Quality and Rate WNL     Speech Clarity Clear     Speech Tone Normal vocal inflection     Vocabulary/Fund of Knowledge WNL     Memory Grossly intact     Thought Process & Associations Circumstantial     Dissociative Symptoms None     Thought Content No abnormalities reported or observed     Hallucinations None     Suicidal Thoughts None     Homicidal Thoughts None     Mood Anxious;Depressed/Sad     Affect Congruent with mood;Anxious;Depressed     Judgment Fair     Insight Fair                          COLUMBIA RISK ASSESSMENTS:     Suicide:  [facility] OP LAST CONTACT      Flowsheet Row BH Office Visit from 04/04/2024 in [facility]   Have you wished you were dead or wished you could go to sleep and not wake up? No   Have you actually had any thoughts of killing yourself?  No   Have you done anything, started to do anything, or prepared to do anything to end your life? No   C-SSRS OP Last Contact Screener Risk Score No Risk           C-SSRS Risk Assessment - 04/04/24 1742          C-SSRS Risk Assessment    Protective Factors (Recent) Supportive social network or family;Fear of death or dying due to pain and suffering                      VIOLENCE:    Violence/Abuse Risk - 04/04/24 1743          Violence Risk    History of Violence? No     Homicidal Ideation? No                      Protective Factors:   Protective Factors - 04/04/24 1742          Protective Factors    Protective Factors (Recent) Supportive social network or family;Fear of death or dying due to pain and suffering                       RISK ASSESSMENTS:     Violence: low (1)  Addiction: low (1)                       SUBSTANCE USE:    Substance Use - 04/04/24 1742          Alcohol Screening (AUDIT-C)    How often did you have a drink containing alcohol in the past year? 0     AUDIT-C total score 0     Refused alcohol screening? No                      ASSESSMENT   CLINICAL FORMULATION (please refer to NEUROPSYCHOLOGICAL/PSYCHOLOGICAL TESTING REPORT for details of testing formulation; initial or additional impressions added here): Clinically, patient presents as cognitively within expectations considering her educational background. Significant anxiety and depressive symptoms are likely affecting everyday cognition. She does not appear to meet the criteria for a neurocognitive disorder. Testing will further provide diagnostic clarification.    See upcoming report for full impressions.      REVIEWING TODAY'S VISIT  CLINICAL INTERVENTIONS TODAY: interview and testing and feedback     PATIENT'S RESPONSE TO INTERVENTIONS: Cooperative and agreeable.     MEDICAL NECESSITY FOR TODAY'S VISIT: contribute necessary clinical information for differential diagnosis  and results will help formulate a treatment plan     DIAGNOSES:    Generalized anxiety disorder  (primary encounter diagnosis)   SNOMED CT(R): GENERALIZED ANXIETY DISORDER     Major depressive disorder, recurrent episode, moderate (HCC)   SNOMED CT(R): RECURRENT MAJOR DEPRESSIVE EPISODES, MODERATE       PLAN: Patient will return to Dr. Arch and her Raval.      RECOMMENDATIONS: See report.     INTERVIEW CONDUCTED BY: Patsey Fayette Conine, PsyD     TESTING CONDUCTED BY: Vernella Niznik Zambrana Bonaparte, PsyD     ACTUAL TOTAL TIME SPENT WITH PATIENT TODAY:     Time spent on clinical activities during this encounter on 04/04/2024  Clinical Activity Time Spent   Neurobehavioral status exam, including clinical interview with the patient 55 minutes   Neuropsychological test administration and scoring by qualified health professional 2 hours, 48 min   Neuropsychological testing evaluation services by  qualified health professional, including integration of patient data, interpretation of results, clinical decision making, treatment planning and report and interactive feedback to patient and family members. 4 hours, 33 min         .

## 2024-04-10 ENCOUNTER — Other Ambulatory Visit: Payer: Self-pay

## 2024-04-10 ENCOUNTER — Ambulatory Visit: Attending: Ophthalmology | Admitting: Ophthalmology

## 2024-04-10 ENCOUNTER — Encounter (HOSPITAL_BASED_OUTPATIENT_CLINIC_OR_DEPARTMENT_OTHER): Payer: Self-pay | Admitting: Ophthalmology

## 2024-04-10 ENCOUNTER — Other Ambulatory Visit (LOCAL_COMMUNITY_HEALTH_CENTER): Payer: Self-pay

## 2024-04-10 DIAGNOSIS — H409 Unspecified glaucoma: Secondary | ICD-10-CM | POA: Insufficient documentation

## 2024-04-10 DIAGNOSIS — F411 Generalized anxiety disorder: Secondary | ICD-10-CM

## 2024-04-10 DIAGNOSIS — R4189 Other symptoms and signs involving cognitive functions and awareness: Secondary | ICD-10-CM

## 2024-04-10 DIAGNOSIS — Z79899 Other long term (current) drug therapy: Secondary | ICD-10-CM

## 2024-04-10 NOTE — Progress Notes (Unsigned)
 CARDIOLOGY CLINIC CONSULT NOTE   Date of visit: 04/11/2024  Language: [place] phone interpreter used     History of present illness:   68 year old female with HTN, CKD 3, RAS, low burden of PAC and PVCS on holter, HL, overweight/obesity.  New diagnosis paroxysmal A-fib on monitor 5/25.  CAD s/p DES x 4 to codominant RCA at [facility] 03/22/2024.  Anxiety/depression.     See 02/01/2024 note for more details    Prior saw Dr Risser      10/26/23     epigastric discomfort into chest, random. Sometimes w chest discomfort, fatigue, fullness in stomach. Stress or emotional upset triggers.  Intermittent chest pain several times a day. cp not consistently related to physical activity or eating.  --stress and event monitor    12/10/23  worsening sob and fatigue x1 yr since trip to [place]  in 8/24 when she also had fast heart rate to 150s.  severe weakness and a rapid heartbeat exceeding 110 beats per minute   Dx PAF, started on metoprolol  and a blood thinner . Metoprolol  improved palpitations significantly but did not affect DOE and exertional fatigue.     --cath     12/31/23  increase torsemide  as recommended.  symptoms overall unchanged  -- Stop chlorthalidone , increase amlodipine , add ezetimibe , can trial NTG    02/01/24  In ER 01/11/2024 with back and abdominal discomfort.  Troponin 14 and 15.  Creatinine 1.6   At home bp 96/64, 105/63, 113/70, 104/59, low 91/59  Says first day of taking higher dose of amlodipine  had stomach that pain and shortness of breath lasting 1 day  difficulty walking due to fatigue and pain that starts in her hip and radiates upwards, leading to chest discomfort. She experiences dizziness, especially when bending down or looking up  No syncope  -- Trial of isosorbide , decrease amlodipine /valsartan  to half tab, echo 8/28.  If symptoms not improved refer for revascularization    Saw cards PA 03/07/24  -says imdur  didn't help so stopped. Set up for cath. Amlodipine /valsartan  back to prior dose        Abdominal pain has significantly improved since starting pantoprazole following a recent cardiac procedure. Prior to her heart procedure she was on omeprazole     04/11/24  Right after cath felt significantly better. Then a few days later most sxs returned.   Says both that symptoms are shorter, less intense and less frequent after cath and that she has only had mild improvement  BP at home 110/68 are a fair number of sbp in the 90s  Symptoms overall are the same type of symptom.  Reporting fatigue and shortness of breath with showering.  Can have epigastric discomfort going to the rib cage.  Says she sleeps efficiently at night  They confirm she is taking clopidogrel and Eliquis  and not aspirin  Reporting a bitter taste in the mouth  reports difficulty walking even short distances at home due to fatigue and knee pain. She sleeps well at night but often needs to nap in the afternoon after morning activities.  Fear and anxiety continue to be a large component of symptoms  - Change atorvastatin  tonight, decrease amlodipine /valsartan  to half tab, labs, slowly do more exertion at home          Most Recent Pulse Reading(s)  04/11/24 : 66  03/07/24 : 70  02/17/24 : 72  02/01/24 : 78  01/31/24 : 73    Most Recent BP Reading(s)  04/11/24 : 127/53  03/07/24 : 153/63  02/17/24 : 155/95  02/01/24 : 140/56  01/31/24 : 107/66      Most Recent Weight Reading(s)  04/11/24 : 88.5 kg (195 lb)  03/07/24 : 86.2 kg (190 lb)  02/01/24 : 88.5 kg (195 lb 3.2 oz)  01/31/24 : 88.5 kg (195 lb)  12/31/23 : 88.3 kg (194 lb 9.6 oz)       Patient Active Problem List:     Essential hypertension     Labyrinthitis     Panic disorder     Hyperlipidemia     Mild episode of recurrent major depressive disorder (HCC)     Allergic rhinitis     Chronic pain of both shoulders     Stage 3a chronic kidney disease     Anxiety state     Psychophysiological insomnia     Secondary hyperparathyroidism (HCC)     Osteopenia     Carpal tunnel syndrome of right  wrist     Fibromyalgia     Hair loss     Vitamin D  deficiency     Obesity (BMI 30-39.9)     Bilateral renal artery stenosis (HCC)     DOE (dyspnea on exertion)     Recurrent epistaxis     Gastroesophageal reflux disease without esophagitis     Other specified hypothyroidism     Combined forms of age-related cataract of right eye     Cervical cancer screening     Prediabetes     Healthcare maintenance     Schistosomiasis     Intraoperative floppy iris syndrome (IFIS)     Combined forms of age-related cataract of left eye     Varicose veins of calf     Chronic bilateral low back pain without sciatica     Morbid obesity with BMI 40.0-44.9, adult (HCC)     Abnormal mammogram     Tubular adenoma of colon     Angina pectoris [facility])     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 incontinence     Gastritis without bleeding     Atrial fibrillation with RVR (HCC)     Status post insertion of drug eluting coronary artery stent      Allergies:  Review of Patient's Allergies indicates:   Pollen extract          Cough    Meds:  Current Outpatient Medications   Medication Instructions    amlodipine -valsartan  (EXFORGE ) 10-320 MG per tablet 1 tablet, Oral, DAILY    apixaban  (ELIQUIS ) 5 mg, Oral, 2 TIMES DAILY    atorvastatin  (LIPITOR) 80 mg, Oral, DAILY    Bimatoprost  (LUMIGAN ) 0.01 % SOLN 1 drop, Both Eyes, NIGHTLY    budesonide -formoterol  (SYMBICORT ,BREYNA ) 160-4.5 MCG/ACT inhaler 2 puffs, Inhalation, 2 TIMES DAILY, Use 1 puff prn during the day. Rinse mouth after use.    calcium  citrate-citamin D (CALCIUM  CITRATE +) 315-5 MG-MCG tablet 1 tablet, Oral, 2 TIMES DAILY    cholecalciferol  (VITAMIN D3) 2,000 Units, Oral, 2 TIMES DAILY    clopidogrel (PLAVIX) 75 mg, DAILY    escitalopram  (LEXAPRO ) 10 mg, Oral, DAILY, With breakfast    ezetimibe  (ZETIA ) 10 mg, Oral, DAILY    famotidine  (PEPCID ) 40 mg, Oral, Nightly    ferrous sulfate  325 mg, Oral, EVERY OTHER DAY    levothyroxine  (SYNTHROID ) 50 mcg, Oral, EVERY MORNING BEFORE BREAKFAST    LORazepam  (ATIVAN ) 0.5 MG  tablet Take 1 tablet by mouth at bedtime. May also take 1 tablet daily as needed for Anxiety.  metoprolol  (TOPROL -XL) 50 mg, Oral, DAILY    nitroGLYCERIN  (NITROSTAT ) 0.4 mg, Sublingual, EVERY 5 MIN PRN    pantoprazole (PROTONIX) 40 mg, DAILY    torsemide  (DEMADEX ) 20 mg, Oral, DAILY       Social History:  mother of 5 and GM of 3   resides with her husband and one dau and one grandchild.   No tob, eoth, drugs    Family History:  Son - needs heart surgery, ?  CABG for cad, in [place]   Daughter -SVT ablation      ROS: All other systems reviewed were pertinently negative apart from the history of present illness.    PHYSICAL EXAM:   04/11/24  0927   BP: 127/53   Site: Left Arm   Position: Sitting   Cuff Size: Large   Pulse: 66   Temp: 97 F (36.1 C)   TempSrc: Temporal   SpO2: 97%   Weight: 88.5 kg (195 lb)           0 lb (0 kg)  Body mass index is 39.39 kg/m.  General: NAD, well developed, well nourished.  HEENT: Oral mucosa is moist, no icterus, no pallor noted.  NECK: No observable jugular venous distention,  .  CHEST: Clear to auscultation, no crackles or rhonchi. Nontender.  HEART:  No heaves or lifts. Regular rate and rhythm.  1/6 SEM.  No ectopy  ABDOMEN:  Soft. Nontender and nondistended.   EXTREMITIES: Warm and well perfused. No edema. 2+ pulses.   NEURO: A&O X 3, moves all extremities. Grossly nonfocal.  PSYCHIATRIC: Mood and affect are appropriate. anxious      DATA:  LABS:   Lab Results   Component Value Date    NA 141 03/27/2024    K 3.7 03/27/2024    CL 99 03/27/2024    CO2 30 03/27/2024    BUN 23 (H) 03/27/2024    CREAT 1.6 (H) 03/27/2024    GLUCOSER 103 03/27/2024    LDL 105 03/14/2024    HDL 42 03/14/2024    TG 149 03/14/2024    ALT 22 01/11/2024    AST 23 01/11/2024    TSH 7.090 (H) 01/14/2021     NT-proBNP (pg/mL)   Date Value   12/10/2023 210 (H)   10/20/2023 182 (H)      EKG: (on my personal review) 10/26/23 nsr, lvh in avl. Qrs 104. Minor st abnormalities lateral leads. Similar to prior.      Holter 03/17/2019  Sinus at 70 with range 52 to 100 BPM.   Approximately 600 APC's = 0.6% of all beats, including 15 couplets and 7 short relatively slow runs.   Approximately 600 VPC's = 0.6% of all beats, including 2 couplets but no runs.   No symptoms.     Holter 10/08/20  Sinus at 79 with range 54 to 121 BPM.   140 APC's including 4 coupletes and one 5-beat run at 130 BPM.    500 VPC's = 0.5% fo all beats, with no couplets or runs.   No symptoms.     Lexiscan  stress 02/26/21  Up to 1 mm of flat ST depression developed in the inferolateral leads after Lexiscan  infusion.  There was no concomitant chest pain   Noinfarct or ischemia.  Normal cardiac wall motion and ejection fraction    01/19/23  Right - The renal artery has evidence of 60-99% stenosis. RI values are elevated indicating intrinsic disease. Kidney length is normal. Renal vein is patent.  Left - The renal artery has evidence of 60-99% stenosis. RI values are elevated indicating intrinsic disease. Kidney length is normal. Renal vein is patent.  Note: SMA has evidence of elevated velocities indicating greater than 70% stenosis.    Echo 01/19/23  1. LVEF 60% .  2.   no regional wall motion abnormalities.  3. grade II diastolic dysfunction.  4. Left Atrium: Moderately dilated.  5. Aortic Valve:  mild sclerosis; no stenosis.  mild aortic regurgitation.  6.  RVSP  30 mmHg which is normal.  7.  no pericardial effusion    Zio 5/25  12 days 20 hours of monitoring after artifact removed  - Predominant sinus rhythm.  Heart rate in sinus 55-122, average 72 bpm  - There were 320 runs of SVT.  The fastest SVT was 6 beats with a maximum rate of 194.  The longest SVT was 8.1 seconds with an average rate of 115 bpm.  Some SVT is atrial tachycardia with variable block  - Atrial fibrillation occurred 2% of the time with heart rates in atrial fibrillation 71-188, average 104.  The longest episode of A-fib was 23 minutes 11 seconds with an average heart rate of 120 bpm.   There were 212 runs of afib and 8 episodes that were 6 minutes or longer.   - Isolated PACs comprise 1.8% of beats.  PAC couplets and triplets were rare  - Isolated PVCs were infrequent.  PVC couplets were rare.  No ventricular runs  - There were no patient events    Stress 11/23/23  Lexiscan   Abnormal ecg with occasional PVCs  There is a small fixed defect of the anterior and anteroseptal wall. Breast artifact is also possible    Cardiac cath at [facility] 12/22/23  Severe co-dominant RCA and distal LCx bifurcation disease. Elevated LVEDP.   Patient with anterior/anteroaplical ischemia on stress and primary complaint of dyspnea. LVEDP is marginally elevated and has small branch disease in distal Lcx which may require bifurcation stenting and co-dominant RCA disease   LAD - mid to distal LAD 60%  LCX mid 70%, OM 2 70%  RCA ostial 90%, prox-mid RCA 70%    Echo 02/17/24:   1.  mild concentric hypertrophy.  2.  EF  55% .  3. no regional wall motion abnormalities.  4.  grade III diastolic dysfunction. The LV end diastolic pressure is elevated.  5. Left Atrium: Severely dilated by volume measuring 49 ml/m.  6. Aortic Valve:   mild sclerosis; no stenosis.  7.  RVSP 32 mmHg    8.  Normal aortic root  3.9 cm.  Dilated ascending aorta  4 cm   9. As compared to  01/19/2023 - aorta measures larger today and diastolic function has worsened.    Methacholine  challenge 02/22/24: result is consistent with airway hyper responsiveness.    Cath 03/22/24  left radial for access.   Severe ostial and mid/distal RCA disease, s/p PCI with 3 mid/distal stents (2.75x12, 2.75x22 and 3x22, PD to 4mm) and 1 ostial stent (3.5x47mm, PD to 4mm)   Plan for Triple therapy for 1 week and then Plavix/Eliquis  for 6 months. At that point, can continue this regiment or switch Plavix for ASA.       Assessment and Plan:  Atrial fibrillation   New dx PAF on zio 5/25. Sxs of palps sig improved w metoprolol . Afib was low burden 2% on zio. Started on eliquis .  TSH normal  4/25  In sinus 8/25  Not getting fast HR at home  - Continue metoprolol  to 50 mg  -Continue Eliquis      CAD  Significant RCA and LCx disease but codominant.   Now s/p multiple DES to RCA 03/22/24 - complex procedure w good end result. Plan for doac/plavix for at least 6 months.  Not completely clear how much of DOE is angina versus CHF versus other.  Given severity of ongoing sxs sent for revascularization. Feels improved but sxs remain       Meds - doac/plavix at least 6 months, 12 months if tolerating  Angina -see above.  DM2-has had A1c >= 6.5 x 2.  A1c 6.5 in 4/25  Tob-no  Exercise-no dedicated exercise - discussed slowly doing more.  Daughters do not feel they could facilitate in person cardiac rehab.  Discussed starting with 2 minutes of walking twice a day and/or 2 minutes of biking twice a day and building up 1 minute at a time  Diet-patient says she avoids fatty foods  Weight-stable  Sleep-says she gets adequate sleep      HFpEF  BNP slightly elevated but may be lower than expected given obesity but does have CKD.  Elevated LVEDP on cath  -Continue  torsemide  20 mg.  Labs stable when in ER  - Update labs.  Consider increasing torsemide  based on echo result although she does not appear significantly volume overloaded and no evidence of pulmonary edema on chest CT 02/17/2024    DOE/fatigue  Has also seen pulmonary.  Had chest CT, methacholine  challenge and echo  Persistent fatigue and exertional dyspnea, possibly related to low blood pressure and deconditioning. Shortness of breath is multifactorial, with heart-related causes addressed.  - Reduce amlodipine -valsartan  to half dose.  - Encourage gradual increase in physical activity, starting with walking or cycling for 2 minutes twice a day, and gradually increasing duration.  - Plans as above.     Htn  Goal <130/80.  Blood pressures at home are normal with normal.  - Reduce amlodipine -valsartan  to half dose.    HL  With CAD goal LDL <55-70  LDL in the 80s on  atorvastatin  80 mg. Added ezetimibe  7/25  - Update lipids on atorvastatin  80 and ezetimibe .    Gastrointestinal symptoms (epigastric pain and dyspepsia)  Epigastric pain and dyspepsia improved slightly with pantoprazole. Symptoms do not appear to be heart-related.  - Continue pantoprazole.  - Discuss potential additional treatments with primary care or gastroenterologist.       Chronic kidney disease, renal artery stenosis   Follows w renal.  not taking spironolactone . On ARB   Cr 1.3-1.5  Consider SGLT2 inhibitor in the future          Fibromyalgia  Significant musculoskeletal pain affecting mobility, particularly in knees, legs, and hips. 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 Main goal bp control.  unlikely to reach the point of intervention    Dizziness   Did not come up today  Previously mentions dizziness and that she has been taking as needed meclizine .  Does seem to be  a spinning component.  Not clearly related to blood pressure the way she is describing it as she says she was dizzy when she had an elevated blood pressure       The patient verbally consented to an audio recording of their visit to assist with the completion  of documentation. The patient is aware the recording is not retained after the visit is summarized.    I spent a total of  40 minutes on this visit on the date of service (total time includes all activities performed on the date of service such as chart review, time speaking with the patient, documenting and communicating with other providers)   Thank you for the privilege of involving me in this patient's care.  Please feel free to contact me if you have any questions.  This patient encounter note was created using voice-recognition software and in real time. Please excuse any typographical errors that have not been edited out.     Electronically signed by: Geni LITTIE Sprang, MD

## 2024-04-10 NOTE — Progress Notes (Signed)
 Sara Snyder is a 68 year old female presenting for 3 month f/u     Pt reports vision is blurry with distance and reading since last visit  Travoprost  does not burn  (-)flashes, floaters  (-)pain, discomfort            OPTOS performed today      Ocular Hx:  Mild stage glc  IFIS       Ocular Meds:  Travoprost  QHS - LD - last night

## 2024-04-10 NOTE — Progress Notes (Signed)
 F/u glaucoma to check IOP     She started taking Travatan  after last visit and said it   Doesn't burn.     Impression,     1. Pseudophakia OU-doing well  OD 02/03/22  OS 03/25/22     2. Glaucoma high suspicion     She did not want to take Latanoprost  since she said it made her eyes burn all day.     IOP  today was 11 OD and 14 OS (travatan )   Disc OCT 04/16/23  had good signal strength and revealed an average retinal NFL thickness of 80 microns OD with a C/D of 0.72 and 64 microns OS with a C/D of 0.76                HVF revealed infero nasal defect OD and temporal defect OS     Plan-continue Travatan  Z 1gtt OU QHS     RTC 4 months to check IOP      3. Refractive error-

## 2024-04-11 ENCOUNTER — Ambulatory Visit: Admission: RE | Admit: 2024-04-11 | Discharge: 2024-04-11 | Disposition: A | Discharge status: Home / Self Care

## 2024-04-11 ENCOUNTER — Ambulatory Visit: Attending: Cardiovascular Disease | Admitting: Cardiovascular Disease

## 2024-04-11 ENCOUNTER — Telehealth (HOSPITAL_BASED_OUTPATIENT_CLINIC_OR_DEPARTMENT_OTHER): Payer: Self-pay

## 2024-04-11 ENCOUNTER — Other Ambulatory Visit: Payer: Self-pay

## 2024-04-11 ENCOUNTER — Ambulatory Visit (HOSPITAL_BASED_OUTPATIENT_CLINIC_OR_DEPARTMENT_OTHER): Payer: Self-pay | Admitting: Cardiovascular Disease

## 2024-04-11 VITALS — BP 127/53 | HR 66 | Temp 97.0°F | Wt 195.0 lb

## 2024-04-11 DIAGNOSIS — I480 Paroxysmal atrial fibrillation: Secondary | ICD-10-CM | POA: Diagnosis present

## 2024-04-11 DIAGNOSIS — R0609 Other forms of dyspnea: Secondary | ICD-10-CM | POA: Insufficient documentation

## 2024-04-11 DIAGNOSIS — I5032 Chronic diastolic (congestive) heart failure: Secondary | ICD-10-CM | POA: Insufficient documentation

## 2024-04-11 DIAGNOSIS — I25118 Atherosclerotic heart disease of native coronary artery with other forms of angina pectoris: Secondary | ICD-10-CM | POA: Diagnosis present

## 2024-04-11 DIAGNOSIS — I10 Essential (primary) hypertension: Secondary | ICD-10-CM | POA: Insufficient documentation

## 2024-04-11 LAB — BASIC METABOLIC PANEL
ANION GAP: 12 mmol/L (ref 10–22)
BUN (UREA NITROGEN): 22 mg/dL — ABNORMAL HIGH (ref 7–18)
CALCIUM: 9.3 mg/dL (ref 8.5–10.5)
CARBON DIOXIDE: 28 mmol/L (ref 21–32)
CHLORIDE: 98 mmol/L (ref 98–107)
CREATININE: 1.6 mg/dL — ABNORMAL HIGH (ref 0.4–1.2)
ESTIMATED GLOMERULAR FILT RATE: 35 mL/min — ABNORMAL LOW (ref >60)
Glucose Random: 105 mg/dL (ref 74–160)
POTASSIUM: 3.8 mmol/L (ref 3.5–5.1)
SODIUM: 138 mmol/L (ref 136–145)

## 2024-04-11 LAB — LIPID PANEL
Cholesterol: 100 mg/dL (ref 0–239)
HIGH DENSITY LIPOPROTEIN: 44 mg/dL (ref 40–60)
LOW DENSITY LIPOPROTEIN DIRECT: 49 mg/dL (ref 0–189)
TRIGLYCERIDES: 106 mg/dL (ref 0–150)

## 2024-04-11 LAB — NT-PROBNP: NT-proBNP: 185 pg/mL — ABNORMAL HIGH (ref 0–125)

## 2024-04-11 MED ORDER — CLOPIDOGREL BISULFATE 75 MG PO TABS
75.0000 mg | ORAL_TABLET | Freq: Every day | ORAL | 3 refills | Status: AC
Start: 2024-04-11 — End: 2025-04-12

## 2024-04-11 MED ORDER — AMLODIPINE BESYLATE-VALSARTAN 10-320 MG PO TABS: 0.5000 | ORAL_TABLET | Freq: Every day | ORAL | 3 refills | Status: DC

## 2024-04-11 NOTE — Telephone Encounter (Signed)
 Called patient via telephone interpreter [place] - PI#:[id number]), spoke to daugher [name] reviewing results and recommendations as noted below. Sara Snyder confirmed an understanding and had no questions.    Burnetta Geni CROME, MD to [facility] Nurses Pool   04/11/24 12:57 PM     Dear Nursing     Please:    1. Create Telephone encounter for this patient.   2. Share with the patient attached results     Plan:   1. Please let them know the kidney numbers are stable.  The cholesterol is well-controlled.  I would recommend keeping the torsemide  at the same dose.       2. Type of Outreach: 3 phone calls and if unable to reach send letter or can send my chart message if patient uses mychart     3. Document the conversation in the Telephone Encounter     Thank you,   Geni CROME Burnetta, MD

## 2024-04-11 NOTE — Patient Instructions (Addendum)
 Change atorvastatin  to night   Goal blood pressure <130/<80  Change amlodipine /valsartan  to 1/2

## 2024-04-25 ENCOUNTER — Ambulatory Visit

## 2024-04-25 ENCOUNTER — Other Ambulatory Visit: Payer: Self-pay

## 2024-04-25 VITALS — BP 107/62 | HR 72 | Temp 96.9°F | Wt 194.0 lb

## 2024-04-25 DIAGNOSIS — I4891 Unspecified atrial fibrillation: Secondary | ICD-10-CM | POA: Insufficient documentation

## 2024-04-25 DIAGNOSIS — D509 Iron deficiency anemia, unspecified: Secondary | ICD-10-CM | POA: Diagnosis present

## 2024-04-25 DIAGNOSIS — K2930 Chronic superficial gastritis without bleeding: Secondary | ICD-10-CM | POA: Insufficient documentation

## 2024-04-25 DIAGNOSIS — I2510 Atherosclerotic heart disease of native coronary artery without angina pectoris: Secondary | ICD-10-CM | POA: Insufficient documentation

## 2024-04-25 DIAGNOSIS — R0609 Other forms of dyspnea: Secondary | ICD-10-CM | POA: Insufficient documentation

## 2024-04-25 DIAGNOSIS — R1084 Generalized abdominal pain: Secondary | ICD-10-CM | POA: Diagnosis present

## 2024-04-25 DIAGNOSIS — Z23 Encounter for immunization: Secondary | ICD-10-CM | POA: Diagnosis present

## 2024-04-25 MED ORDER — POLYETHYLENE GLYCOL 3350 17 GM/SCOOP PO POWD: 17.0000 g | Freq: Every day | ORAL | 1 refills | Status: DC

## 2024-04-25 NOTE — Progress Notes (Signed)
 HPI  Sara Snyder is a 68 year old female for scheduled appointment    Interview was in POrtuguese with phone interpreter    She is here with her daugher     History of Present Illness  Sara Snyder, with heart disease, presents with excessive stomach pain.  Her daughter is here, who states:     - she has a lot of anxiety around her heart and cardiac status  - still having persistent  abdominal pain  - still having shortness of breath since [facility]     She experiences severe stomach pain that radiates to the front and back. Bowel movements remain normal, occurring one to three times daily without straining. Since starting her current medications after a heart procedure, she has experienced a loss of taste, with water tasting savory and food tasting bitter. She has a history of gastritis, diagnosed last year via endoscopy, which showed irritation and high acid levels in the stomach. She rarely eats red meat, preferring chicken, and has recently lost her taste for fish.    Significant fatigue and shortness of breath occur, particularly after activities such as showering. These symptoms have been present since her heart procedure a month ago.     Her recent medical history I reviewed includes:  - LHC March 22 2024,CAD s/p DES x 4 to codominant RCA at [facility] 03/22/2024   - Dr. Burnetta Cardiology note- pt doing well post cath/on medical therapy  - 10.21 labs- BNP, lipid, BMP unremarkable     includes a heart procedure on  and a CT scan of her chest in August 2025. She is currently on multiple heart medications, which have improved her cholesterol levels but have not alleviated her fatigue or shortness of breath.        Social History    Social History Narrative      Originally from [place], [place]      History of childhood trauma      Lives with family      The combined HPI and assessment and plan are below; with any medical information and history updated and reviewed.     Physical Exam  BP 107/62 (Site: RA, Position:  Sitting, Cuff Size: Lrg)   Pulse 72   Temp 96.9 F (36.1 C) (Temporal)   Wt 88 kg (194 lb)   SpO2 94%   BMI 39.18 kg/m   Pain Score: Data Unavailable          Comfortable appearing, put together older  woman,   EOMI   Alert and oriented to person, place, and time  Appropriate mood, affect, tone.[name] eye contact, pleasant     Assessment and Plan    Problem List           Diagnosed       High    DOE (dyspnea on exertion)     Overview   Has chronic DOE   Her symptoms mildly improved with a cardiac catheterization in October 2025 for this reason they persist.  - I believe this is multifactorial partly driven by stressors, partly deconditioning after recent revascularization,  And most impoprtantly=  pulmonary  Her CT February 17, 2024 does demonstrate multiple scattered granulomatous disease so this may be contributing as well.  She has follow-up with pulmonary and I reviewed that this will be important    Symptoms may be exacerbated by recent cardiac stress and recovery.  - Follow up with pulmonologist in a few weeks to assess lung  function and address breathing issues         Atrial fibrillation with RVR (HCC)     Overview   10/2023 new diagnosis PAF- on cardiac monitoring /Zio for symptoms of palpitatons. Afib was low burden 2% on zio.   -TSH normal 09/2023  - Continue metoprolol  to 50 mg  -Continue Eliquis   Start Toprol -XL 25 mg daily  Eliquis  5 mg twice daily    sig improved w metoprolol - in sinus today         Coronary artery disease involving native coronary artery of native heart     Overview   03/22/2024 CAD s/p DES x 4 to codominant RCA at [facility]     Plan for apixiban and plavix for at least 6 months- d/c ASA     She has strong mood symptoms and anxiety around her cardiovascular health, despite the procedure.   After revascularization she had some improvement in symptoms but not all, likely these are multifactorial    Cardiac perspective her symptoms are stable and she is medically optimized. A lot of  time was spent around reassurance and explaining the benefit of an elective revascularization vs an emergent one, and how to continue to recover from here.   Cardiac rehab may be something of benefit to her given her residual symptoms  Encouraged her and her daughter to continue to work on conditioning   And focus on pulmonary f/u as above               Medium    Gastritis without bleeding     Overview   EGD 05/12/2023 with gastritis pathology.consistent with mild chronic inactive gastritis    On Pantoprazole daily AM - reviewed proper dosing  And H2 blocker nightly          Current Assessment & Plan   Chronic gastritis and altered taste, likely due GERD/reflux and acid         Generalized abdominal pain - Primary     Overview   Intermittent and persistent abdominal pain  Some aspect of it may be secondary to GERD    She has had a lot of workup for abdominal pain including a colonoscopy in January 2025 which revealed several hyperplastic polyps and 1 tubular adenoma    01/11/2024 she had a CT abdomen pelvis with contrast which showed hepatic steatosis no acute process.  Was notable for moderate stool in the colon without obstruction.  Gallbladder looked normal.    Also having small frequent bowel movements 2 or 3 a day which is consistent with probably overflow diarrhea causing her abdominal pain dyspepsia  Asked her to do a KUB to confirm although the CAT scan does give us  an idea but it was 4 months ago.  Also asked her to use MiraLAX daily for at least 2 months and GI referral if no improvement    Reassured that there are no acute processes based on her previous workup and longevity of symptoms but we will continue to work with her         Relevant Medications    polyethylene glycol (GLYCOLAX) 17 GM/SCOOP powder    Other Relevant Orders    XR ABDOMEN (KUB) 1 VIEW           Results via MyChart   Follow up 2 months, with me or PA sooner PRN     Sylvanus Telford R. Veleria, MD  04/25/24 9:23 AM      Time spent was 52 minutes  on  the day of service on:  Preparing to see the patient (e.g., review of tests)  and/or Obtaining and/or reviewing separately obtained history  and/or Performing a medically appropriate examination and/or evaluation  and/or Counseling and educating the patient, family, or caregiver  and/or Ordering medications, tests, or procedures  and/or Referring and communicating with other health care professionals (when not separately reported)  and/orDocumenting clinical information in the electronic or other health record  and/or Independently interpreting results (not separately reported) and communicating results to the patient, family, or caregiver  and/or Care coordination (not separately reported)

## 2024-04-25 NOTE — Progress Notes (Signed)
 Influenza Vaccine Procedure  April 25, 2024    1. Has the patient received the information for the influenza vaccine? Yes    2. Does the patient have any of the following contraindications?  Allergy to eggs? No  Allergic reaction to previous influenza vaccines? No  Any other problems to previous influenza vaccines? No  Paralyzed by Guillain-Barre syndrome?  No  Current moderate or severe illness? No    3. The vaccine has been administered in the usual fashion.     Immunization information reviewed. Current VIS reviewed and given to patient/ guardian. Verbal assent obtained from patient/ guardian.  See immunization/Injection module or chart review for date of publication and additional information. Verbal assent obtained from patient/guardian. Comfort measures for possible side effects reviewed.

## 2024-05-04 ENCOUNTER — Encounter (HOSPITAL_BASED_OUTPATIENT_CLINIC_OR_DEPARTMENT_OTHER): Payer: Self-pay

## 2024-05-04 DIAGNOSIS — I2510 Atherosclerotic heart disease of native coronary artery without angina pectoris: Secondary | ICD-10-CM | POA: Insufficient documentation

## 2024-05-04 DIAGNOSIS — R1084 Generalized abdominal pain: Secondary | ICD-10-CM | POA: Insufficient documentation

## 2024-05-04 NOTE — Assessment & Plan Note (Signed)
 Chronic gastritis and altered taste, likely due GERD/reflux and acid

## 2024-05-07 ENCOUNTER — Telehealth (HOSPITAL_BASED_OUTPATIENT_CLINIC_OR_DEPARTMENT_OTHER): Payer: Self-pay | Admitting: Cardiovascular Disease

## 2024-05-07 DIAGNOSIS — I25118 Atherosclerotic heart disease of native coronary artery with other forms of angina pectoris: Secondary | ICD-10-CM

## 2024-05-07 DIAGNOSIS — R0609 Other forms of dyspnea: Secondary | ICD-10-CM

## 2024-05-07 NOTE — Telephone Encounter (Signed)
 HI please call daugther and let her know that I communicated with PCP and one idea for pt is to try out cardiac rehab.  It is mostly supervised exercise with nurses. Usual programs are 3 times a week.   Iw ill put in an order. Unfortunately we don't have it at [place] right now  So often pts go to [facility] but looks like they are in  [place]. The could explore [facility] if they think this is something their mom may want to try  If they look into it and want to pursue it they should let me know and I can order a stress test on the treadmill as a baseline   Thanks  Geni

## 2024-05-08 NOTE — Telephone Encounter (Signed)
 Called patient via telephone interpreter:Yes.  What is their name? Portuguese Interpreter [phone]. Spoke to the patient's daughter Sara Snyder.  Results and plan of care discussed as noted below. Provider recommendations reviewed in detail with patient's agent. They confirmed understanding and had the following response.        Sara Snyder states, We have purchased an exercise bike at home and the patient is doing well with this; so for now we will continue with this exercise bike.      Dr Burnetta notified.        Burnetta Geni CROME, MD Yesterday        HI please call daugther and let her know that I communicated with PCP and one idea for pt is to try out cardiac rehab.  It is mostly supervised exercise with nurses. Usual programs are 3 times a week.   Iw ill put in an order. Unfortunately we don't have it at [place] right now  So often pts go to [facility] but looks like they are in  [place]. The could explore [facility] if they think this is something their mom may want to try  If they look into it and want to pursue it they should let me know and I can order a stress test on the treadmill as a baseline   Thanks  Geni

## 2024-05-09 ENCOUNTER — Ambulatory Visit: Attending: Pulmonary Disease | Admitting: Pulmonary Disease

## 2024-05-09 ENCOUNTER — Encounter (HOSPITAL_BASED_OUTPATIENT_CLINIC_OR_DEPARTMENT_OTHER): Payer: Self-pay | Admitting: Pulmonary Disease

## 2024-05-09 ENCOUNTER — Other Ambulatory Visit: Payer: Self-pay

## 2024-05-09 VITALS — BP 130/62 | HR 75 | Temp 97.5°F | Wt 191.0 lb

## 2024-05-09 DIAGNOSIS — J4540 Moderate persistent asthma, uncomplicated: Secondary | ICD-10-CM | POA: Insufficient documentation

## 2024-05-09 DIAGNOSIS — I25119 Atherosclerotic heart disease of native coronary artery with unspecified angina pectoris: Secondary | ICD-10-CM | POA: Diagnosis present

## 2024-05-09 DIAGNOSIS — R0602 Shortness of breath: Secondary | ICD-10-CM | POA: Insufficient documentation

## 2024-05-09 DIAGNOSIS — Z6841 Body Mass Index (BMI) 40.0 and over, adult: Secondary | ICD-10-CM | POA: Insufficient documentation

## 2024-05-09 DIAGNOSIS — K219 Gastro-esophageal reflux disease without esophagitis: Secondary | ICD-10-CM | POA: Insufficient documentation

## 2024-05-09 DIAGNOSIS — E66813 Obesity, class 3: Secondary | ICD-10-CM | POA: Diagnosis present

## 2024-05-09 MED ORDER — BUDESONIDE-FORMOTEROL FUMARATE 160-4.5 MCG/ACT IN AERO
2.0000 | INHALATION_SPRAY | Freq: Two times a day (BID) | RESPIRATORY_TRACT | 11 refills | Status: AC
Start: 2024-05-09 — End: 2025-05-09

## 2024-05-09 MED ORDER — ALBUTEROL SULFATE HFA 108 (90 BASE) MCG/ACT IN AERS
2.0000 | INHALATION_SPRAY | Freq: Four times a day (QID) | RESPIRATORY_TRACT | 11 refills | Status: AC | PRN
Start: 2024-05-09 — End: 2024-06-08

## 2024-05-09 NOTE — Progress Notes (Unsigned)
 PULMONARY CLINIC VISIT NOTE    CC: Asthma    HPI:   Sara Snyder is a pleasant 68 year old female with HTN, CKD 3, RAS, low burden of PAC and PVCS on holter, HL, overweight/obesity.  New diagnosis paroxysmal A-fib on monitor 5/25. CAD s/p DES x 4 to codominant RCA at [facility] 03/22/2024.  Anxiety/depression.      Portuguese telephone interpreter was used during the visit. Patient's daughters were also present and helped with giving more history.     - symptoms have been ongoing for 1 year but worsened over the past month   - describes wheezing and a dry cough   - no hx of childhood asthma   - she's tried symbicort  160/4.28mcg 2 puffs bid inconsistently for some time and then stopped it because she didn't feel like it helped   - her daughter reports that cough got worse during pollen season, also reports dyspnea with showering and walking and needs help with ADLs  - no pets or carpets at home  - no smoking  - no hx of eczema   - does have environmental allergies     Interval hx since last visit:   - methacholine  challenge positive for asthma  - underwent LHC and PCI with DES x 4 to codominant RCA at [facility] 03/22/2024     Today:   - continues to complain of fatigue with minimal exertion   - bought a stationary bike and slowly trying to build up stamina  - she's been seen by cardiology  - used symbicort  for 1-2 months but then ran out so she stopped it and took a breakto see if it made a difference but it didn't  - she recalls that the inhalers didn't help her breathing  - dyspnea is intermittent and usually occurs with exertion, it's not constant dyspnea     PMH:  Patient Active Problem List:     Essential hypertension     Panic disorder     Hyperlipidemia     Mild episode of recurrent major depressive disorder (HCC)     Stage 3a chronic kidney disease     Anxiety state     Psychophysiological insomnia     Secondary hyperparathyroidism (HCC)     Osteopenia     Fibromyalgia      Vitamin D  deficiency     Obesity (BMI 30-39.9)     Bilateral renal artery stenosis (HCC)     DOE (dyspnea on exertion)     Recurrent epistaxis     Other specified hypothyroidism     Cervical cancer screening     Prediabetes     Schistosomiasis     Abnormal mammogram     Tubular adenoma of colon     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 incontinence     Gastritis without bleeding     Atrial fibrillation with RVR (HCC)     Status post insertion of drug eluting coronary artery stent     Coronary artery disease involving native coronary artery of native heart     Generalized abdominal pain      SH:  Social History     Socioeconomic History    Marital status: Married     Spouse name: Not on file    Number of children: Not on file    Years of education: Not on file    Highest education level: Not on file   Occupational History    Not on file   Tobacco Use  Smoking status: Former     Current packs/day: 0.00     Average packs/day: 2.0 packs/day for 0.3 years (0.7 ttl pk-yrs)     Types: Cigarettes     Start date: 41     Quit date: 10/27/1977     Years since quitting: 46.5    Smokeless tobacco: Never   Substance and Sexual Activity    Alcohol use: Not Currently    Drug use: Never    Sexual activity: Yes     Partners: Male   Other Topics Concern    Not on file   Social History Narrative    Originally from [place], [place]    History of childhood trauma    Lives with family   Social Drivers of Catering Manager Strain: Not on file  Food Insecurity: Not on file  Transportation Needs: Not on file  Physical Activity: Not on file  Stress: Not on file  Social Connections: Not on file  Intimate Partner Violence: Not on file  Housing Stability: Not on file      Allergies:     Review of Patient's Allergies indicates:   Pollen extract          Cough     Medications:     Medication List      as of Oct 29, 2021 10:43 AM     You have not been prescribed any medications.         Vital Signs :     BP: (130)/(62)   Temp:  [97.5 F (36.4 C)]   Pulse:  [75]    Resp: --  SpO2:  [97 %]     Physical Exam:   General appearance: alert, appears stated age, and cooperative  Lungs: clear to auscultation bilaterally  Heart:  regular rate and rhythm, S1, S2 normal, no murmur, click, rub or gallop  Abdomen: soft, non-tender; bowel sounds normal; no masses,  no organomegaly  Extremities: extremities normal, atraumatic, no cyanosis or edema  Pulses: 2+ and symmetric     DATA:     Recent labs:  Lab Results   Component Value Date    NA 138 04/11/2024    K 3.8 04/11/2024    CL 98 04/11/2024    CO2 28 04/11/2024    BUN 22 (H) 04/11/2024    CREAT 1.6 (H) 04/11/2024    GLUCOSER 105 04/11/2024     Lab Results   Component Value Date    WBC 5.6 03/14/2024    HGB 10.3 (L) 03/14/2024    HCT 31.4 (L) 03/14/2024    PLTA 255 03/14/2024     Methacholine  challenge 02/2024:   SUMMARY: This patient underwent bronchoprovation testing by  methacholine  challenge. The PC20, which is the provocative  concentration of methacholine  needed to produce a 20% reduction in  the FEV1, was calculated by interpolation to be 2.28  .  INTERPRETATION: This result is consistent with airway  hyperresponsiveness.    Chest CT 01/2024:   Lungs: No consolidation or honeycombing.  There is a 1.7 cm thin-walled pulmonary cyst in the right lower lobe.     -2 mm triangular perifissural nodule in the right lower lobe, (image #[id number]). By Fleischner guidelines, perifissural and subpleural nodules with typical morphology are almost certainly benign lymphoid tissue and generally do not require further follow-up. Follow-up can be considered based on clinical risk factors.      There are multiple (at least 10) scattered benign calcified granulomas measuring up to 4 mm.  Pleura: No pleural effusion or pneumothorax.      Airways: Patent.      Vessels: The aorta is nondilated.  The pulmonary artery is nondilated.     Heart: Cardiac size is normal. There are severe coronary artery calcifications. There is no pericardial effusion.       Mediastinum/Lymph nodes: There is a mildly enlarged right paratracheal node measuring 1.1 cm in short axis. There are nonenlarged calcified pulmonary hilar nodes. Limited evaluation of the pulmonary hilar regions due to noncontrast technique.    ASSESSMENT/PLAN:    # Dyspnea on exertion   # Moderate persistent asthma  # Environmental allergies    # Significant CAD s/p DES 03/2024  # GERD w/ gastritis on EGD in 06/2023  # Morbid obesity      Dyspnea is likely multifactorial in the setting of asthma, CAD and likely some deconditioning.   Methacholine  challenge was positive, consistent w/ diagnosis of asthma. CAD is certainly also playing a role but she is now s/p DES so should help. At this point I think patient should try to rebuild strength and stamina as she's been minimally active over the past few months due to this dyspnea. We discussed ways at home to be more active, they recently purchased a stationary bike. Unfortunately she's unable to attend cardiac or pulmonary rehab due to transportation.      Of note, other workup including echo was negative for pulmonary hypertension and chest CT ruled out interstitial lung disease.     Plan:   - refill symbicort  160/4.109mcg 2 puffs bid  - continue zyrtec  10mg  daily given allergy symptoms  - continue omeprazole  40mg , defer to PCP/GI if a repeat EGD is warranted should symptoms persist  - CAD management per cardiology  - encouraged patient to increase activity as tolerated   - follow up in 6 months or sooner if needed       Carline Lento, MD  Division of Pulmonary and Critical Care Medicine  Pager: [phone]

## 2024-05-11 ENCOUNTER — Ambulatory Visit (HOSPITAL_BASED_OUTPATIENT_CLINIC_OR_DEPARTMENT_OTHER): Admitting: Pulmonary Disease

## 2024-05-16 ENCOUNTER — Other Ambulatory Visit (LOCAL_COMMUNITY_HEALTH_CENTER): Payer: Self-pay

## 2024-05-16 DIAGNOSIS — Z79899 Other long term (current) drug therapy: Secondary | ICD-10-CM

## 2024-05-16 DIAGNOSIS — F411 Generalized anxiety disorder: Secondary | ICD-10-CM

## 2024-05-16 NOTE — Telephone Encounter (Signed)
 PER who is calling: Pharmacy, Sara Snyder is a 68 year old female has requested a refill of       Lorazepam  .        Only complete for controlled medication:    Previously prescribed:    Start Date 03/29/24           End Date 04/28/24              Last Office Visit  : 02/08/2024 Arbutus Mems, APRN      Last Tele Visit:  03/29/2024      Last Physical Exam:  06/13/2020    PAP SMEAR due on 04/09/2024    Other Med Adult:  Most Recent BP Reading(s)  05/09/24 : 130/62        Cholesterol (mg/dL)   Date Value   89/78/7974 100     LOW DENSITY LIPOPROTEIN DIRECT (mg/dL)   Date Value   89/78/7974 49     HIGH DENSITY LIPOPROTEIN (mg/dL)   Date Value   89/78/7974 44     TRIGLYCERIDES (mg/dL)   Date Value   89/78/7974 106         THYROID  SCREEN TSH REFLEX FT4 (uIU/mL)   Date Value   10/20/2023 3.430         TSH (THYROID  STIM HORMONE) (uIU/mL)   Date Value   01/14/2021 7.090 (H)       HEMOGLOBIN A1C (%)   Date Value   12/03/2022 6.9 (H)       POC HEMOGLOBIN A1C (%)   Date Value   10/20/2023 6.5 (H)         INR (no units)   Date Value   12/10/2023 1.8       SODIUM (mmol/L)   Date Value   04/11/2024 138       POTASSIUM (mmol/L)   Date Value   04/11/2024 3.8           CREATININE (mg/dL)   Date Value   89/78/7974 1.6 (H)       Documented patient preferred pharmacies:    [facility] [place], [place] - [address]  Phone: [phone] Fax: [phone]

## 2024-05-29 ENCOUNTER — Other Ambulatory Visit (HOSPITAL_BASED_OUTPATIENT_CLINIC_OR_DEPARTMENT_OTHER): Payer: Self-pay | Admitting: Pulmonary Disease

## 2024-05-29 ENCOUNTER — Other Ambulatory Visit (HOSPITAL_BASED_OUTPATIENT_CLINIC_OR_DEPARTMENT_OTHER): Payer: Self-pay

## 2024-05-29 DIAGNOSIS — K2970 Gastritis, unspecified, without bleeding: Secondary | ICD-10-CM

## 2024-05-29 NOTE — Telephone Encounter (Signed)
 PER who is calling: Pharmacy, Sara Snyder is a 68 year old female has requested a refill of cetirizine  .    Last Office Visit: 11 18 25      Other Med Adult:  Most Recent BP Reading(s)  05/09/24 : 130/62        Cholesterol (mg/dL)   Date Value   89/78/7974 100     LOW DENSITY LIPOPROTEIN DIRECT (mg/dL)   Date Value   89/78/7974 49     HIGH DENSITY LIPOPROTEIN (mg/dL)   Date Value   89/78/7974 44     TRIGLYCERIDES (mg/dL)   Date Value   89/78/7974 106         THYROID  SCREEN TSH REFLEX FT4 (uIU/mL)   Date Value   10/20/2023 3.430         TSH (THYROID  STIM HORMONE) (uIU/mL)   Date Value   01/14/2021 7.090 (H)       HEMOGLOBIN A1C (%)   Date Value   12/03/2022 6.9 (H)       POC HEMOGLOBIN A1C (%)   Date Value   10/20/2023 6.5 (H)         INR (no units)   Date Value   12/10/2023 1.8       SODIUM (mmol/L)   Date Value   04/11/2024 138       POTASSIUM (mmol/L)   Date Value   04/11/2024 3.8           CREATININE (mg/dL)   Date Value   89/78/7974 1.6 (H)       Documented patient preferred pharmacies:    [facility] [place], [place] - [address]  Phone: [phone] Fax: [phone]

## 2024-05-29 NOTE — Telephone Encounter (Signed)
 PER who is calling: Pharmacy, Sara Snyder is a 68 year old female has requested a refill of       famotidine  (PEPCID ) 40 MG tablet .      Last Office Visit  : 04/25/2024 [name], MD      Last Physical Exam:  06/13/2020    PAP SMEAR due on 04/09/2024    Other Med Adult:  Most Recent BP Reading(s)  05/09/24 : 130/62        Cholesterol (mg/dL)   Date Value   89/78/7974 100     LOW DENSITY LIPOPROTEIN DIRECT (mg/dL)   Date Value   89/78/7974 49     HIGH DENSITY LIPOPROTEIN (mg/dL)   Date Value   89/78/7974 44     TRIGLYCERIDES (mg/dL)   Date Value   89/78/7974 106         THYROID  SCREEN TSH REFLEX FT4 (uIU/mL)   Date Value   10/20/2023 3.430         TSH (THYROID  STIM HORMONE) (uIU/mL)   Date Value   01/14/2021 7.090 (H)       HEMOGLOBIN A1C (%)   Date Value   12/03/2022 6.9 (H)       POC HEMOGLOBIN A1C (%)   Date Value   10/20/2023 6.5 (H)         INR (no units)   Date Value   12/10/2023 1.8       SODIUM (mmol/L)   Date Value   04/11/2024 138       POTASSIUM (mmol/L)   Date Value   04/11/2024 3.8           CREATININE (mg/dL)   Date Value   89/78/7974 1.6 (H)       Documented patient preferred pharmacies:    [facility] [place], [place] - [address]  Phone: [phone] Fax: [phone]

## 2024-05-29 NOTE — Progress Notes (Unsigned)
 ADULT OUTPATIENT PSYCHIATRY (OPD)  PSYCHOPHARM FOLLOW UP    BH TREATMENT TEAM:  GERIATRICS    LANGUAGE OF CARE:    Portuguese ([place]ian)      LANGUAGE NEEDS MET:    Telephone Interpreter    [facility] [phone]    CHIEF COMPLAINT: I'm doing as good as can be      INTERVAL HISTORY: Sara Snyder is a 68 year old woman with PPH of MDD, GAD, panic, insomnia and gaming disorder.   PMH includes CAD, s/p stent, HLD, HTN, Afib with RVR, stg 3a CKD and fibromyalgia.  Engagement with [facility] psychiatry began in 2019.  She has had multiple providers for psychotherapy and psycho pharm.                                                                                                                                                                                                                                                                                                                                                                                            Per Chart Review:  -05/30/24 Neuropsych report from 04/04/24 testing: Ms. Melcher cognitive profile is broadly within normal expectations for her age and educational background, with preserved orientation, attention, language (naming and fluency), visuospatial construction, executive control, and visual memory. A circumscribed weakness emerged on a learning/free recall in the low-average range, with intact recognition and comparatively stronger visual memory, an efficiency/retrieval pattern rather than an encoding/storage deficit. All results were interpreted using multicultural references appropriate to her linguistic and educational context.   Although the family reports gradual forgetfulness for about 4 years, preserved recognition and nonverbal memory, preserved orientation, absence of  generalized executive slowing, and limited functional impact argue against a primary degenerative amnestic syndrome at this time (e.g., Alzheimer's disease). While mild vascular contributions remain  possible given medical comorbidities, they are not clearly expressed in the current pattern. Therefore, continued clinical monitoring is recommended; optimizing sleep, mood, and anxiety treatment, and possibly reviewing sedating/anticholinergic medications are expected to offer cognitive benefit. This evaluation provides a useful baseline for future comparison.       -Follow-up consult with Dr. Arch on 03/29/24, note reads in part:Today [name] reported overall improvement in anxiety after cardiac cath. Her anxiety was likely higher due to abrupt cessation of lorazepam . She was doing relatively okay on lorazepam  0.5mg  qhs with daily PRN. Explained risks with benzo use for her age and efforts to minimize the use. Will continue escitalopram  10mg  daily and lorazepam  0.5mg  qhs and 0.5mg  daily PRN for now and let her recover from cardiac cath. It is possible she continues to improve psychiatrically, as her cardiac health improves.     -03/27/24 Telephone encounter with Dr. Arch and patient's daughter regarding miscommunication with previous psychopharm provider.  Patient was without lorazepam  for 1 week.  Tried gabapentin  for 5 days, experienced dizziness.  Patient doing ok after recent switch from clonazepam  2 mg TDD to Lorazepam  1 mg TDD.  Daughter assists with medication management. Lorazepam  0.5 mg BID prescribed by Dr. Arch. Recommend very gradual taper since she been taking BNZs for many years.    On Interview Patient Reports:  Feels good, no anxiety now, but can feel very anxious at times    Collateral from Daughter:  -Has had depression and anxiety for a long time, needs to resume psychotherapy  -Has fibromyalgia, heart problems, needs pain management  -Needs to continue medications and follow-up for anxiety and depression  -Daughter assists with medication management      Psychiatric Review of Systems:  Depression: Sleep-Well; Appetite-Good; Guilt-Denies; Energy-low; Concentration-a  little;Psychomotor-no changes; Suicidal Thoughts-Denies  Mania/Hypomania: Denies symptoms  Anxiety: Denies current; but can be 10, sometimes so anxious  PTSD: +Trauma hx-husband hears her yelling at night she doesn't remember, +flashbacks  OCD: Obsessions-denies; compulsions-denies  Psychosis: AH-denies; VH- denies; Delusions-denies  Eating disorder: Denies restricting, bingeing or purging behaviors  Substance Use: Denies any substances; coffee- 2 cups           PAST PSYCHIATRIC HISTORY: (From Dr. Arch consult note 01/18/24)  Diagnostic History: PTSD, panic disorder, recurrent MDD, gaming disorder  History of Psychiatric Hospitalizations: denied  Past Medication Trials:  Past trials include:  Sertraline    Clonazepam   Trazodone   Venlafaxine   Duloxetine   Amitriptyline    Escitalopram   Gabapentin    Lorazepam    No trials on bupropion, antipsychotics, buspirone  Outpatient Care: [facility] since 2019, multiple providers for psychotherapy and psychopharm-  Hx of SI: denied  Hx of SIB: denied  Hx of SA: denied  Hx of HI: denied  Hx of violence: denied    PSYCHOSOCIAL: (From Dr. Arch consult note 01/18/24)  Originally from [place], [place]. Moved to the [place]  7 years ago. Has 4 daughters, close to them and they help her with daily activities. Lives with 2 of them and their family. 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 Stays home and take care of her grandchildren.    OBJECTIVE DATA:  CURRENT MEDICATIONS:    Current Outpatient Medications   Medication Sig    cetirizine  (ZYRTEC ) 10 MG tablet Take 1 tablet by mouth daily    famotidine  (  PEPCID ) 40 MG tablet Take 1 tablet by mouth at bedtime    LORazepam  (ATIVAN ) 0.5 MG tablet Take 1 tablet by mouth in the morning and 1 tablet before bedtime.    budesonide -formoterol  (SYMBICORT ,BREYNA ) 160-4.5 MCG/ACT inhaler Inhale 2 puffs into the lungs in the morning and 2 puffs before bedtime. Use 1 puff prn during the day. Rinse mouth after use.    albuterol  HFA 108 (90 Base)  MCG/ACT inhaler Inhale 2 puffs into the lungs every 6 (six) hours as needed for Wheezing    polyethylene glycol (GLYCOLAX ) 17 GM/SCOOP powder Take 17 g by mouth daily    clopidogrel  (PLAVIX ) 75 MG tablet Take 1 tablet by mouth daily    amlodipine -valsartan  (EXFORGE ) 10-320 MG per tablet Take 0.5 tablets by mouth daily    escitalopram  (LEXAPRO ) 10 MG tablet Take 1 tablet by mouth in the morning. With breakfast.    pantoprazole (PROTONIX) 40 MG tablet Take 40 mg by mouth daily    nitroGLYCERIN  (NITROSTAT ) 0.4 MG sublingual tablet Place 1 tablet under the tongue every 5 (five) minutes as needed for Chest pain    ezetimibe  (ZETIA ) 10 MG tablet Take 1 tablet by mouth daily    atorvastatin  (LIPITOR) 80 MG tablet Take 1 tablet by mouth daily    Bimatoprost  (LUMIGAN ) 0.01 % SOLN Place 1 drop into both eyes nightly    apixaban  (ELIQUIS ) 5 MG po tablet Take 1 tablet by mouth in the morning and 1 tablet before bedtime.    torsemide  (DEMADEX ) 10 MG tablet Take 2 tablets by mouth daily    metoprolol  (TOPROL -XL) 50 MG 24 hr tablet Take 1 tablet by mouth daily    levothyroxine  (SYNTHROID ) 50 MCG tablet Take 1 tablet by mouth every morning before breakfast    ferrous sulfate  325 (65 FE) MG tablet Take 1 tablet by mouth every other day    cholecalciferol  (VITAMIN D3) 2000 UNIT tablet Take 1 tablet by mouth in the morning and 1 tablet before bedtime. (Patient not taking: Reported on 09/14/2023)    calcium  citrate-citamin D (CALCIUM  CITRATE +) 315-5 MG-MCG tablet Take 1 tablet by mouth in the morning and 1 tablet before bedtime.     No current facility-administered medications for this visit.         VITAL SIGNS:   There were no vitals filed for this visit.    LABS  WHITE BLOOD CELL COUNT (TH/uL)   Date Value   03/14/2024 5.6     RED BLOOD CELL COUNT (M/uL)   Date Value   03/14/2024 3.64 (L)     HEMOGLOBIN (g/dL)   Date Value   90/76/7974 10.3 (L)     No results found for: IDEALHCT  HEMATOCRIT (%)   Date Value   03/14/2024 31.4 (L)      No results found for: HCTDIFF  MEAN CORPUSCULAR VOL (fl)   Date Value   03/14/2024 86.3     MEAN CORPUSCULAR HGB (pg)   Date Value   03/14/2024 28.3     MEAN CORP HGB CONC (g/dL)   Date Value   90/76/7974 32.8     No results found for: RDW  PLATELET COUNT (TH/uL)   Date Value   03/14/2024 255     MEAN PLATELET VOLUME (fL)   Date Value   03/14/2024 10.4     NEUTROPHIL % (%)   Date Value   01/11/2024 57.2     LYMPHOCYTE % (%)   Date Value   01/11/2024 33.9  MONOCYTE % (%)   Date Value   01/11/2024 5.7     EOSINOPHIL % (%)   Date Value   01/11/2024 2.4     BASOPHIL % (%)   Date Value   01/11/2024 0.3       Glucose Random (mg/dL)   Date Value   89/78/7974 105     BUN (UREA NITROGEN) (mg/dL)   Date Value   89/78/7974 22 (H)     CALCIUM  (mg/dL)   Date Value   89/78/7974 9.3     CHLORIDE (mmol/L)   Date Value   04/11/2024 98     CARBON DIOXIDE (mmol/L)   Date Value   04/11/2024 28     ANION GAP (mmol/L)   Date Value   04/11/2024 12     CREATININE (mg/dL)   Date Value   89/78/7974 1.6 (H)     POTASSIUM (mmol/L)   Date Value   04/11/2024 3.8     SODIUM (mmol/L)   Date Value   04/11/2024 138     ESTIMATED GLOMERULAR FILT RATE (ML/MIN)   Date Value   04/11/2024 35 (L)             ALANINE AMINOTRANSFERASE (U/L)   Date Value   01/11/2024 22   12/10/2023 17   10/20/2023 17     ASPARTATE AMINOTRANSFERASE (U/L)   Date Value   01/11/2024 23   12/10/2023 19   10/20/2023 21     ALKALINE PHOSPHATASE (U/L)   Date Value   01/11/2024 125 (H)   12/10/2023 137 (H)   10/20/2023 120 (H)     HEMOGLOBIN A1C (%)   Date Value   12/03/2022 6.9 (H)   05/25/2022 6.3 (H)   02/24/2022 6.4 (H)     POC HEMOGLOBIN A1C (%)   Date Value   10/20/2023 6.5 (H)     Cholesterol (mg/dL)   Date Value   89/78/7974 100   03/14/2024 162   12/10/2023 132     LOW DENSITY LIPOPROTEIN DIRECT (mg/dL)   Date Value   89/78/7974 49   03/14/2024 105   12/10/2023 80     HIGH DENSITY LIPOPROTEIN (mg/dL)   Date Value   89/78/7974 44   03/14/2024 42   12/10/2023 39  (L)     TRIGLYCERIDES (mg/dL)   Date Value   89/78/7974 106   03/14/2024 149   12/10/2023 140       THYROID  SCREEN TSH REFLEX FT4 (uIU/mL)   Date Value   10/20/2023 3.430     VITAMIN B12 (pg/mL)   Date Value   10/20/2023 276   01/24/2020 311   07/20/2019 329       VITAMIN D ,25 HYDROXY (ng/mL)   Date Value   10/20/2023 34       EKG REPORT 10/26/23   Test Reason :    Vent. Rate :  78 BPM     Atrial Rate :  78 BPM       P-R Int : 166 ms          QRS Dur : 104 ms        QT Int : 396 ms       P-R-T Axes :  45 -18  49 degrees      QTcB Int : 451 ms         Normal sinus rhythm    Moderate voltage criteria for LVH, may be normal variant ( R in aVL , [employer] product )  Cannot rule out Anterior infarct , age undetermined    Abnormal ECG    When compared with ECG of 20-Oct-2023 09:58,    ST less depressed in Lateral leads        MRI Brain WO Contrast  Status: Final result   Study Result 01/24/20    Narrative & Impression   CLINICAL INDICATION: memory difficulties     COMPARISON: None.     TECHNIQUE: Brain MRI performed using sagittal T1 weighted, axial T1, T2, FLAIR, gradient-echo and diffusion sequences.      FINDINGS:     IMPRESSION:      Brain: There is no hemorrhage, acute infarct, or mass. There are moderate FLAIR hyperintense foci in the periventricular/subcortical white matter. Mild patchy FLAIR hyperintensity in descending white matter tracts in brainstem. There is a small chronic white matter lacunar infarct adjacent to the posterior right lateral ventricle. 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     Ventricles and CSF spaces: Unremarkable     Orbits: Unremarkable     Paranasal sinuses and mastoids: Well aerated     Vasculature: Flow voids of major vessels the base of brain are present.     IMPRESSION:  1. No acute intracerebral pathology.  2. White matter signal abnormalities, nonspecific, likely sequela of chronic small vessel ischemic changes. Small chronic white matter lacunar infarct.              CURRENT  TREATMENT/CONTACT INFO FOR OTHER AGENCIES AND MENTAL HEALTH PROVIDERS (if applicable):     Contact/Community Support    No documentation.              COLUMBIARISK ASSESSMENTS:     Suicide:  [facility] OP LAST CONTACT      Flowsheet Row BH Office Visit from 05/30/2024 in [facility]   Have you wished you were dead or wished you could go to sleep and not wake up? No   Have you actually had any thoughts of killing yourself?  No   Have you done anything, started to do anything, or prepared to do anything to end your life? No   C-SSRS OP Last Contact Screener Risk Score No Risk      C-SSRS Risk Assessment    No documentation.                 VIOLENCE:    Violence/Abuse Risk    No documentation.              ADDITIONAL SCREENINGS:       04/25/2024 01/18/2024 09/14/2023   PHQ-9 Screening   Anhedonia 1  3  1     Low mood/hopelessness 1  3  1     Sleep disturbance  3  1    Fatigue  3  1    Appetite disturbance  0  0    Low self-worth  3  0    Impaired concentration  3  0    Psychomotor retardation/agitation  0  0    Suicidal ideation  0  0    PHQ-9 TOTAL  18  4        Patient-reported            04/25/2024 01/18/2024 09/14/2023   GAD-7 Screening   Nervousness 3  3  1     Uncontrollable worry 1  3  1     Excessive worry  3  1    Trouble relaxing  3  1    Restlessness  2  0    Irritability  3  1    Fears of future  3  1    Difficulty functioning  Very difficult  Not difficult at all    GAD-7 Score  20  6        Patient-reported           04/25/2024 01/18/2024 09/14/2023   ALCOHOL/DRUG SCREENING   Frequency of 4+ drinks/day past year (Over 65) 0      Frequency of drug use past year 0  0  0        Patient-reported           11/17/2017   ALCOHOL (AUDIT) Screening   Frequency of 4+ drinks/day 0        Data saved with a previous flowsheet row definition         04/25/2024 01/18/2024 09/14/2023   DRUG (DAST) Screening   Drug use frequency 0  0  0        Patient-reported         02/17/2024 01/11/2024 12/03/2022   SDOH Screening   Patient  willing to answer questions No No Yes    Worry food will run out   Never    Food won't last   Never    Threatend to shut utilities   No    Trouble getting transportation   No    Living situation today   I have a steady place to live.    Internet access   Yes, on a computer,Yes, on my phone    Experienced violence   No    OK for [facility] to reach out   No    Referral to community programs   No        Patient-reported       REVIEW OF SYSTEMS:  REVIEW OF SYSTEMS (Prescribers Only):  .: All systems reviewed and negative unless noted otherwise.  Musculoskeletal: pain     MENTAL STATUS EXAMINATION:   Mental Status Exam - 05/30/24 1032          Mental Status Exam     General Appearance Dressed appropriately     Behavior Cooperative     Level of Consciousness Alert     Orientation Level Grossly intact     Attention/Concentration WNL     Mannerisms/Movements No abnormal mannerisms/movements     Speech Quality and Rate WNL     Speech Clarity Clear     Speech Tone Normal vocal inflection     Vocabulary/Fund of Knowledge WNL     Memory Grossly intact     Thought Process & Associations Circumstantial     Dissociative Symptoms None     Thought Content No abnormalities reported or observed     Hallucinations None     Suicidal Thoughts None     Homicidal Thoughts None     Mood Anxious     Affect Congruent with mood     Judgment Fair     Insight Fair                RISK ASSESSMENTS:       Violence: low (1)     Addiction: low (1)     BIO/PSYCHO/SOCIAL AND RISK FORMULATION(S): Sara Snyder is a 68 year old woman with PPH of MDD, GAD, panic, insomnia and gaming disorder.   PMH includes CAD, s/p stent, HLD, HTN, Afib with RVR, stg 3a CKD and fibromyalgia.  Engagement with [facility] psychiatry began in  2019.  She has had multiple providers for psychotherapy and psycho pharm.                                                                        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 Discussion included the following key points: purpose of meds; potential side effects, including black-box warning when appropriate, disease process; how to reach this writer, how to reach EMS if feeling at risk to harm self or others.      COUNSELING AND COORDINATION OF CARE PROVIDED:   I spent a total of  40 minutes on this visit on the date of service (total time includes all activities performed on the date of service)    CARE PLAN/ EPISODES:  No linked episodes             Avelina DELENA Landy Ole, APRN

## 2024-05-30 ENCOUNTER — Encounter (HOSPITAL_BASED_OUTPATIENT_CLINIC_OR_DEPARTMENT_OTHER): Payer: Self-pay

## 2024-05-30 ENCOUNTER — Ambulatory Visit

## 2024-05-30 ENCOUNTER — Ambulatory Visit
Admission: RE | Admit: 2024-05-30 | Discharge: 2024-05-30 | Disposition: A | Attending: Diagnostic Radiology | Admitting: Diagnostic Radiology

## 2024-05-30 ENCOUNTER — Other Ambulatory Visit: Payer: Self-pay

## 2024-05-30 ENCOUNTER — Other Ambulatory Visit (HOSPITAL_BASED_OUTPATIENT_CLINIC_OR_DEPARTMENT_OTHER): Payer: Self-pay

## 2024-05-30 DIAGNOSIS — F3341 Major depressive disorder, recurrent, in partial remission: Secondary | ICD-10-CM

## 2024-05-30 DIAGNOSIS — R1084 Generalized abdominal pain: Secondary | ICD-10-CM

## 2024-05-30 DIAGNOSIS — I2510 Atherosclerotic heart disease of native coronary artery without angina pectoris: Secondary | ICD-10-CM

## 2024-05-30 DIAGNOSIS — F411 Generalized anxiety disorder: Secondary | ICD-10-CM

## 2024-05-30 DIAGNOSIS — K5900 Constipation, unspecified: Secondary | ICD-10-CM | POA: Diagnosis present

## 2024-05-30 DIAGNOSIS — R109 Unspecified abdominal pain: Secondary | ICD-10-CM | POA: Diagnosis present

## 2024-05-30 DIAGNOSIS — K2930 Chronic superficial gastritis without bleeding: Secondary | ICD-10-CM

## 2024-05-30 DIAGNOSIS — Z23 Encounter for immunization: Secondary | ICD-10-CM

## 2024-05-30 DIAGNOSIS — I4891 Unspecified atrial fibrillation: Secondary | ICD-10-CM

## 2024-05-30 DIAGNOSIS — F410 Panic disorder [episodic paroxysmal anxiety] without agoraphobia: Secondary | ICD-10-CM

## 2024-05-30 DIAGNOSIS — Z79899 Other long term (current) drug therapy: Secondary | ICD-10-CM

## 2024-05-30 DIAGNOSIS — R0609 Other forms of dyspnea: Secondary | ICD-10-CM

## 2024-05-30 DIAGNOSIS — D509 Iron deficiency anemia, unspecified: Secondary | ICD-10-CM

## 2024-05-30 NOTE — Progress Notes (Signed)
 CONFIDENTIAL NEUROPSYCHOLOGICAL EVALUATION    Name: Sara Snyder   MRN: [id number]   Age: 68   Date of Birth: 03.15.1957   Language: Portuguese ([place])   Education: 3   Handedness: Right   Medications: See Epic for medication list   Date of Exam: 10.14.2025   Examiner: Patsey Mars, PsyD   This evaluation was completed to answer clinical questions and concerns. It was not intended to document details of disability or damages due to some event that might now be or become the focus of litigation.     SOURCES OF INFORMATION  This evaluation is based on a clinical interview and neuropsychological testing with Sara Snyder, collateral interviews with her husband, Kalli Greenfield, and daughter, Sara Snyder, and a review of her electronic medical records. Sara Snyder historical account was generally reliable; however, her responses were frequently tangential and circumstantial, likely anxiety-related, so collateral information was emphasized. Her husband and daughter provided direct, consistent reports.    REASON FOR REFERRAL  Ms. Siebers is a 68 year old woman from [place] referred by her geriatric psychiatrist, Ernst Lown, MD, for neuropsychological evaluation to clarify the presence of a possible neurocognitive disorder in the context of reported cognitive and behavioral changes and to inform treatment planning.    PRESENTING PROBLEM  Cognitive Symptoms   Ms. Barbato endorsed a decline in memory and thinking, noting she had previously been "very good at [employer]" and felt "sharper." She has struggled to learn English despite coursework and often deviated from questions during the interview, necessitating greater reliance on collateral data.     Her husband described preserved remote memory with intermittent difficulty learning recent information (e.g., misplacing glasses, forgetting elements of recent conversations) occurring about every other week and inconsistently. Sara Snyder reported that in  the past 4 years, Sara Snyder has become slower and more easily distracted, with reduced new learning (e.g., difficulty programming the television) and forgetting details, and sometimes the gist, of recent conversations at least weekly, with gradual progression. The family denied clear episodes of acute disorientation or confusion in the past year, though Sara Snyder noted occasional confusion about the day of the week when she wakes up.    Psychiatric, Behavioral, and Social Functioning  She reported a history of panic attacks (with brief disorientation), chronic anxiety, and depression. Sara Snyder has used benzodiazepines since approximately age 89 and is transitioning from clonazepam  to lorazepam . She endorsed persistent nervousness and fear of death; Sara Snyder also noted physical limitations due to medical conditions.  Functional Status  Sara Snyder lives in [place] with her husband and another daughter. This daughter coordinates medical appointments. She does not leave home unaccompanied. Her daughter assists with finances and medication management, and Sara Snyder uses a pillbox. She cooks and can budget money. She has never driven due to fear. At the interview, the family did not report functional decline beyond what could be explained by reduced motivation.    BACKGROUND INFORMATION  The following summarizes key historical information relevant to the current evaluation. For additional information, including full mental status examination findings, please refer to the electronic medical record.    Relevant Medical and Neurological History   Medical records document essential hypertension, hyperlipidemia, bilateral renal artery stenosis, dyspnea on exertion, atrial fibrillation with RVR, status post drug-eluting coronary artery stent, prediabetes, right-eye age-related cataract, stage 3a chronic kidney disease (CKD), female stress incontinence, fibromyalgia, osteopenia, right-wrist carpal tunnel syndrome, and  psychophysiological insomnia. No neuroimaging was available for review. Family neurological history is unknown, as she was raised  by her uncles.    Psychiatric and Substance Use History  Medical records note panic disorder, recurrent mild major depressive disorder, and anxiety state. She has a history of childhood trauma and long-term benzodiazepine treatment for anxiety/PTSD. In psychiatric consultation, she agreed to a gabapentin  trial, minimizing anticholinergic agents, and VNA support for medication adherence. During the interview, she repeatedly referenced traumatic childhood experiences and longstanding anxiety/depression.     Developmental and Psychosocial History  Born in [place] and raised in [place], [place]. She had no relationship with her biological parents and was raised by her uncles. She married on February 02, 1970 (met spouse around age 89), has five children (four daughters in [place], one son in [place]) and three grandchildren, and moved to [place]  in [place].    Educational and Occupational History  She completed about 3 years of formal schooling; advancement beyond fourth grade was unavailable locally. Courses included mathematics, science, Portuguese, and history; she reported above-average performance relative to peers. She is reportedly literate in Portuguese with limited English. 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    BEHAVIORAL OBSERVATIONS AND MENTAL STATUS  [name] arrived on time with her daughter and husband, appropriately dressed, with good grooming and hygiene. Her gait, hearing, and vision were adequate for testing. She was cooperative and engaged, maintaining good eye contact and appearing invested in the process. She was alert and fully oriented to person, place, time, and situation. Her speech was clear; her vocabulary was consistent with her  educational background; her comprehension was intact without the need for repetition. No obvious word-finding difficulty was observed during the interview. Her thought processes were logical with adequate judgment. Her mood was anxious and depressed; she became tearful on two occasions yet displayed appropriate humor when prompted. Responses were frequently tangential and circumstantial, likely anxiety-related. Autobiographical remote and recent recall appeared grossly intact based on history, and processing speed appeared to be within behavioral expectations.  Qualitative Testing Observations   She recalled personal dates; named grandchildren and ages; recited days of the week forward and backward; counted 1-10 forward and backward; and recited months forward and backward.    EVALUATION PROCEDURES  Clinical Interview  Literacy Assessment  Dot Counting Test (DCT)  Coin in Hand (CIH)  European Cross-Cultural Neuropsychological Test Battery (CNTB): Selected subtests      Refer to the Appendix for raw test scores. Results were interpreted based on age and, when available, gender and culturally/linguistically- appropriate normative data provided within a test's manual unless otherwise specified in the Appendix. Score descriptors generally correspond to the percentile ranges defined below, separated by those tests with normal, non-normal, and corrected distributions. Please note that tests that are compared to demographically adjusted samples will also be noted in the Appendix.     SCORE DESCRIPTORS     Normal Distributions Non-Normal Distributions Corrected Distributions**   Percentile Descriptor Descriptor Descriptor   >=98 Exceptionally High WNE* Above Expectation   91-97 Above Average WNE Above Expectation   85-90 High Average WNE Above Expectation   75-84 High Average WNE WNE   25-74 Average WNE WNE   16-24 Low Average Low Average WNE   9-15 Low Average Low Average Below Expectation   2-8 Below Average Below  average Below Expectation   <2 Exceptionally Low  Exceptionally Low Below Expectation   *WNE: within normal expectation  ** Scores: Demographically  Adjusted (e.g., Sex, Race, Education) as noted in Appendix             RESULTS AND IMPRESSIONS   Ms. Heffernan's overall performance validity was within the expected range, suggesting that she adequately engaged in the testing procedure. Consequently, the results of this evaluation are considered valid and an accurate representation of her current neurocognitive functioning.    The neuropsychological testing battery consisted of tests with normative data from a multicultural population (corrected for education, 0-3 years), and normative data from a [place] population were also considered. Factors such as her educational background likely impacted her test scores. Nevertheless, these covariables were considered, and all interpretations were made cautiously.    Cognitive Findings  Ms. Cregg's performance on a brief global screen fell within normal expectations compared to age and education-matched peers. Her attention and executive functions were broadly adequate for age and background, with efficient set maintenance and inhibitory control on speeded tasks; one timed visual-scanning measure was considered not interpretable. Her confrontation naming and semantic fluency were within expected limits. Her visuoconstruction was accurate and organized, and nonverbal learning and delayed reproduction (visual memory) of a semi-complex figure were solidly within normal expectations.  Her learning on a dual-modality (verbal/visual) memory test showed a circumscribed weakness characterized by low-average acquisition and delayed free recall (10th percentile), while recognition memory was intact, with high hits and no false positives, suggesting preserved consolidation with relative mild retrieval inefficiency.     Summary and Conclusions  Ms. Crum's cognitive profile is broadly  within normal expectations for her age and educational background, with preserved orientation, attention, language (naming and fluency), visuospatial construction, executive control, and visual memory. A circumscribed weakness emerged on a learning/free recall in the low-average range, with intact recognition and comparatively stronger visual memory, an efficiency/retrieval pattern rather than an encoding/storage deficit. All results were interpreted using multicultural references appropriate to her linguistic and educational context.    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 Although the family reports gradual forgetfulness for about 4 years, preserved recognition and nonverbal memory, preserved orientation, absence of generalized executive slowing, and limited functional impact argue against a primary degenerative amnestic syndrome at this time (e.g., Alzheimer's disease). While mild vascular contributions remain possible given medical comorbidities, they are not clearly expressed in the current pattern. Therefore, continued clinical monitoring is recommended; optimizing sleep, mood, and anxiety treatment, and possibly reviewing sedating/anticholinergic medications are expected to offer cognitive benefit. This evaluation provides a useful baseline for future comparison.    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  Hearing and vision: Update audiology and ophthalmology (right-eye  cataract).    2) Medications:  With psychiatry/PCP, consider possibly gradually tapering benzodiazepine using a slow, individualized plan; avoid abrupt changes.  Considering a review to minimize anticholinergic burden and other sedating agents.  Maintain pillbox and family medication supervision.    3) Mental health & sleep:  Establish care with a Portuguese-speaking therapist for CBT targeting anxiety/depression and insomnia.  Daily relaxation practice (paced breathing, brief mindfulness) 10-15 minutes.      4) Cognitive strategy training:  Use external aids (calendar/whiteboard/task lists/smartphone reminders, labeled containers, consistent "key/glasses station").  One-thing-at-a-time: reduce multitasking, quiet settings, extra time, repeat/teach-back.  Spaced retrieval & rehearsal; write a brief "gist" of important conversations.  Maintain structured routines for meals, meds, and appointments.    5) Language/education considerations:  Set realistic expectations for English learning; prioritize functional phrases. Conduct complex tasks in Portuguese when possible.  Use bilingual, environmental education officer for health information.    6) Lifestyle & brain health:  Physical activity: About 150 minutes/week moderate aerobic exercise plus light strength/balance, as medically cleared.  Cognitive & social engagement in enjoyable, success-oriented activities.  Heart-healthy nutrition (Mediterranean-style), hydration, and regular daytime light.    7) Safety & function:  Continue family accompaniment for community outings as needed; she does not drive.  Simplify finances (auto-pay; daughter oversight) and keep important items in consistent locations.  Review home safety (clear walkways, lighting, appliance routines).    8) Education for family:  Provide psychoeducation on normal aging vs. disorder, effects of anxiety/insomnia/medications on memory, and the value of structure and reassurance.  Encourage consistent cueing  and positive reinforcement rather than repeated testing or criticism.    9) Follow-up  PCP or Psychiatry to repeat the same brief global cognitive screen in 6-12 months (i.e., RUDAS; she scored 28/30 on current testing) using the same language and similar setting to track stability/trajectory; share results with treating clinicians.   Repeat comprehensive neuropsychological evaluation in 12-18 months (earlier if functional decline).  Sooner re-evaluation if red flags emerge: getting lost consistently, major IADL decline, new confusion/hallucinations, falls, or stroke symptoms.    Thank you for the opportunity to contribute to Ms. Mcever's care. Please feel free to contact me at [phone] or [phone] with any further questions.      _______________________________    Patsey Fayette Conine, PsyD     Staff Neuropsychologist   [facility] ([facility])  Instructor of Psychology, Department of Psychiatry  [employer]         cc: Ernst Lown, MD  CONFIDENTIAL NEUROPSYCHOLOGICAL EVALUATION    SCORE APPENDIX    * Do not remove this appendix from the attached report. These scores should not be interpreted without reference to the complete report. *    NAPA   Neuropsychology Summary of Scores   Initials: MC           Age: 69   Education: 3     Handedness: R   Language:  Portuguese     PERFORMANCE VALIDITY                     Raw    Range    Coin in the Hand 9    WNE              Errors UG G E-Score Range    Dot Counting 0 7.83 3 10.83 WNE    LITERACY                           Literacy Assessment Comments     Oral Alphabet Correct     Recognition of letters Correct     Read common words Correct     Write common words Correct     Oral Numbers (1 to 25) Correct     Recognition of Numbers Correct     GENERAL COGNITION  RUDAS Raw M/10/5  % Range    Visuospatial Orientation 5        Praxis 2        Drawing 1        Judgment 4        Memory Recall 8        Language (semantic fleuncy) 8         Total 28/30 26/23/23  >50 WNE    *[employer] multicultural normative data        ATTENTION / EXECUTIVE FUNCTION                     CNTB Executive Functions         Color Trails* Raw M/10/5  % Range    CTT 1 (2 errors) 122   - Indeterminate    *[employer] data                 Five Digit Test  Raw M/10/5  % Range    FDT 1 (0 errors) 33 47/67/73  >50 WNE    FDT 2 (0 errors) 31 48/64/69  >50 WNE    FDT 3 (1 error) 59 67/115/163  >50 WNE              Raw M/10/5  % Range    Serial Threes* 6 5/0/0  >50 WNE    *[employer] multicultural normative data        LANGUAGE                     CNTB Language Raw M/10/5  % Range    RPT Naming 10 10/7/6  >10 WNE    Animal Fluency 17 12/8/6  >50 WNE    Supermarket Items Fluency 13 16/8/7  >10 WNE    *[employer] multicultural normative data        VISUALSPATIAL & VISUAL MEMORY                     CNTB Visuospatial Functions* Raw M/10/5  % Range    Semi-complex figure         Copy 16.5 16/13/13  >50 WNE    Semi-complex figure -Recall 15 11/8/8  >50 WNE    *[employer] multicultural normative data        MEMORY                     CNTB Memory* Raw M/10/5  % Range    RPT         Incidential Recall (Trial 1) 4 5/4/2  10 Low Avg    RPT Trial 2 6 -  - -    RPT Trial 3 9 -  - -    Immediate Recall Average 6 8/6/5  10 Low Avg    Delayed Recall 6 8/6/5  10 Low Avg    Recognition (10H, 0FP) 10 10/9/9  >10 WNE    *[employer] data

## 2024-06-01 ENCOUNTER — Ambulatory Visit (HOSPITAL_BASED_OUTPATIENT_CLINIC_OR_DEPARTMENT_OTHER): Payer: Self-pay

## 2024-06-06 NOTE — Progress Notes (Signed)
 Dear RN,    Please:    1. Create Telephone encounter for this patient.  2. Share with the patient the attached results     Team PA working with PCP. Chart reviewed as appropriate.     KUB does not show obstruction. It does show small to moderate amount of stool in colon.       Plan:  1. Continue miralax  per PCP recs. At f/u if chronic abd pain not improved will consider referral to GI.  Attn hydration, regular physical activity, fiber in diet.     2. Type of Outreach: 3 phone calls and if unable to reach send letter    3. Document the conversation in the Telephone Encounter and close the encounter, no need to send back to me.     Thank you,  Edsel Farrier, PA-C

## 2024-06-06 NOTE — Telephone Encounter (Signed)
-----   Message from Edsel Farrier sent at 06/06/2024  2:02 PM EST -----  Dear RN,    Please:    1. Create Telephone encounter for this patient.  2. Share with the patient the attached results     Team PA working with PCP. Chart reviewed as appropriate.     KUB does not show obstruction. It does show small to moderate amount of stool in colon.       Plan:  1. Continue miralax  per PCP recs. At f/u if chronic abd pain not improved will consider referral to GI.  Attn hydration, regular physical activity, fiber in diet.     2. Type of Outreach: 3 phone calls and if unable to reach send letter    3. Document the conversation in the Telephone Encounter and close the encounter, no need to send back to me.     Thank you,  Edsel Farrier, PA-C    ----- Message -----  From: Interface, Radiology  Sent: 05/30/2024  12:03 PM EST  To: Rosalynn JONELLE Ruddle, MD

## 2024-06-06 NOTE — Telephone Encounter (Addendum)
 Spoke w/ dtr  Relayed results and plan of care  Reminded of f/u appt  Dtr agreeable to plan    Dtr requesting some muscle relaxants for pt.   Has hx of fibromyalgia and typically takes tylenol  but sometimes pain gets very bad and needs muscle relaxants since she cannot take NSAID d/t kidney functions.  Currently pain located on L shoulder/L armpit. Tylenol  has not been helpful at all. No trauma to area.     Routing to provider    Pieter Roers, RN, 06/06/2024

## 2024-06-06 NOTE — Telephone Encounter (Deleted)
-----   Message from Edsel Farrier sent at 06/06/2024  2:02 PM EST -----  Dear RN,    Please:    1. Create Telephone encounter for this patient.  2. Share with the patient the attached results     Team PA working with PCP. Chart reviewed as appropriate.     KUB does not show obstruction. It does show small to moderate amount of stool in colon.       Plan:  1. Continue miralax  per PCP recs. At f/u if chronic abd pain not improved will consider referral to GI.  Attn hydration, regular physical activity, fiber in diet.     2. Type of Outreach: 3 phone calls and if unable to reach send letter    3. Document the conversation in the Telephone Encounter and close the encounter, no need to send back to me.     Thank you,  Edsel Farrier, PA-C    ----- Message -----  From: Interface, Radiology  Sent: 05/30/2024  12:03 PM EST  To: Rosalynn JONELLE Ruddle, MD

## 2024-06-07 NOTE — Telephone Encounter (Signed)
 Muscle relaxants are likely not appropriate  Can try tylenol  or limited NSAIDs  If worsening pain see a provider on the teamin person.  As Edsel Farrier, PA noted, this has not been prescription for > 2 years    Saje Gallop R. Veleria, MD  06/07/24 12:36 PM

## 2024-06-08 NOTE — Telephone Encounter (Addendum)
 Spoke w/ dtr,  Relayed information  Offered appt in the clinic. Dtr declined for now  Pt has f/u with nephro next week. Will ask about nsaids use and will f/u with us  if needed    Pieter Roers, RN, 06/08/2024

## 2024-06-14 ENCOUNTER — Encounter (HOSPITAL_BASED_OUTPATIENT_CLINIC_OR_DEPARTMENT_OTHER): Payer: Self-pay

## 2024-06-14 ENCOUNTER — Other Ambulatory Visit: Payer: Self-pay

## 2024-06-14 ENCOUNTER — Ambulatory Visit: Admission: RE | Admit: 2024-06-14 | Discharge: 2024-06-14 | Disposition: A | Discharge status: Home / Self Care

## 2024-06-14 ENCOUNTER — Ambulatory Visit

## 2024-06-14 DIAGNOSIS — I150 Renovascular hypertension: Secondary | ICD-10-CM

## 2024-06-14 DIAGNOSIS — I10 Essential (primary) hypertension: Secondary | ICD-10-CM

## 2024-06-14 DIAGNOSIS — G8929 Other chronic pain: Secondary | ICD-10-CM | POA: Diagnosis not present

## 2024-06-14 DIAGNOSIS — M25512 Pain in left shoulder: Secondary | ICD-10-CM | POA: Diagnosis not present

## 2024-06-14 DIAGNOSIS — N1832 Chronic kidney disease, stage 3b: Secondary | ICD-10-CM | POA: Diagnosis present

## 2024-06-14 LAB — URINALYSIS
BILIRUBIN, URINE: NEGATIVE
GLUCOSE, URINE: NEGATIVE mg/dL
NITRITE, URINE: NEGATIVE
OCCULT BLOOD, URINE: NEGATIVE
PH, URINE: 5.5 (ref 5.0–8.0)
PROTEIN, URINE: NEGATIVE mg/dL
SPECIFIC GRAVITY, URINE: 1.021 (ref 1.003–1.035)
UROBILINOGEN, URINE: 1 U/dL (ref 0.2–1.0)

## 2024-06-14 LAB — PTH INTACT WITH CALCIUM
PTH INTACT CALCIUM: 9.2 mg/dL (ref 8.5–10.5)
PTH INTACT: 194 pg/mL — ABNORMAL HIGH (ref 15–65)

## 2024-06-14 LAB — MICROALBUMIN RANDOM URINE
ALB/CREAT RATIO URINE RAN: 8 ug/mg (ref 0–30)
ALBUMIN URINE RANDOM: 1.8 mg/dL (ref 0.0–1.9)
CREATININE RANDOM URINE: 235 mg/dL — ABNORMAL HIGH (ref 28–217)

## 2024-06-14 LAB — CBC WITH PLATELET
ABSOLUTE NRBC COUNT: 0 TH/uL (ref 0.0–0.0)
HEMATOCRIT: 33.3 % — ABNORMAL LOW (ref 34.1–44.9)
HEMOGLOBIN: 10.7 g/dL — ABNORMAL LOW (ref 11.2–15.7)
MEAN CORP HGB CONC: 32.1 g/dL (ref 31.0–37.0)
MEAN CORPUSCULAR HGB: 27.9 pg (ref 26.0–34.0)
MEAN CORPUSCULAR VOL: 86.7 fl (ref 80.0–100.0)
MEAN PLATELET VOLUME: 10.6 fL (ref 8.7–12.5)
NRBC %: 0 % (ref 0.0–0.0)
PLATELET COUNT: 247 TH/uL (ref 150–400)
RBC DISTRIBUTION WIDTH STD DEV: 42.7 fL (ref 35.1–46.3)
RED BLOOD CELL COUNT: 3.84 M/uL — ABNORMAL LOW (ref 3.90–5.20)
WHITE BLOOD CELL COUNT: 6.2 TH/uL (ref 4.0–11.0)

## 2024-06-14 LAB — POC URINALYSIS
BILIRUBIN, URINE: NEGATIVE
GLUCOSE,URINE: NEGATIVE
KETONE, URINE: NEGATIVE
NITRITE, URINE: NEGATIVE
OCCULT BLOOD, URINE: NEGATIVE
PH URINE: 6 (ref 5.0–8.0)
SPECIFIC GRAVITY, URINE: 1.015 (ref 1.003–1.030)
UROBILINOGEN URINE: 0.2 (ref 0.2–1.0)

## 2024-06-14 LAB — BASIC METABOLIC PANEL
ANION GAP: 11 mmol/L (ref 10–22)
BUN (UREA NITROGEN): 22 mg/dL — ABNORMAL HIGH (ref 7–18)
CALCIUM: 9.2 mg/dL (ref 8.5–10.5)
CARBON DIOXIDE: 30 mmol/L (ref 21–32)
CHLORIDE: 99 mmol/L (ref 98–107)
CREATININE: 1.4 mg/dL — ABNORMAL HIGH (ref 0.4–1.2)
ESTIMATED GLOMERULAR FILT RATE: 40 mL/min — ABNORMAL LOW (ref >60)
Glucose Random: 89 mg/dL (ref 74–160)
POTASSIUM: 4.1 mmol/L (ref 3.5–5.1)
SODIUM: 140 mmol/L (ref 136–145)

## 2024-06-14 LAB — PHOSPHORUS: PHOSPHORUS: 3.4 mg/dL (ref 2.5–4.9)

## 2024-06-14 LAB — FERRITIN: FERRITIN: 20 ng/mL (ref 13–150)

## 2024-06-14 LAB — MAGNESIUM: MAGNESIUM: 1.9 mg/dL (ref 1.6–2.6)

## 2024-06-14 LAB — IRON: IRON: 51 ug/dL (ref 50–170)

## 2024-06-14 LAB — TOTAL IRON BINDING CAPACITY: TOTAL IRON BIND CAPACITY CALC: 388 ug/dL (ref 280–504)

## 2024-06-14 LAB — ALBUMIN: ALBUMIN: 4.2 g/dL (ref 3.4–5.2)

## 2024-06-14 LAB — VITAMIN D,25 HYDROXY: VITAMIN D,25 HYDROXY: 33 ng/mL (ref 30.0–100.0)

## 2024-06-14 NOTE — Patient Instructions (Signed)
 Please call the Specialties Scheduling Center [facility]) at [phone] select option 1 and then option 1 and one of our Specialties Schedulers will assist you in scheduling your indicated referral.

## 2024-06-14 NOTE — Progress Notes (Unsigned)
 Nephrology and Hypertension Follow-up Note  Reason for visit: This is a 68 year old year-old female here for follow up of CKD, bilateral renal artery stenosis. She has a PMHx of bilateral renal artery stenosis, hypertension, hyperlipidemia, GERD, fibromyalgia.     Past pertinent renal history:    Baseline creatinine: 1.1-1.3     Patient was first diagnosed with HTN: age 54. She had previously been on spironolactone , however this was discontinued due to low blood pressures and elevated heart rate.     Seen by Dr. Hazel, vascular surgery, on 06/24/20:  Comment: Reviewing her duplex imaging studies, she appears to have severe parenchymal disease that would not be improved with renal artery revascularization.  Plan: Based on the CORAL and ASTRAL trials, this medical management is paramount for her as revascularization would only run the risks of the procedure with no added benefit.  Considering her renal function is relatively normal, further intervention would not be deemed necessary.  However, if her renal function were to deteriorate with significant hypertension, then a last resort procedure may be in order.    08/31/23 Visit  - Last seen in renal clinic on 05/19/23  - Current BP meds: amlodipine -valsartan  (exforge ) 5-320 mg daily, torsemide  10 mg daily, spironolactone  50 mg daily, chlorthalidone  25 mg daily. Home readings 110-120/50-80 on average.  -Reports doing well overall. Denies any urinary symptoms. Appetite good. She states that swelling has improved with increased torsemide  dose. Biggest concern is surrounding clonazepam . She states that she has been on this medication for many years and is running out, which adds to her anxiety.    Today- 06/14/24 Visit  - Reports strong pain n her left arm,/shoulder that is limiting her activities. Unable to take NSAIDs due to CKD. She reports fatigue and dyspnea on exertion as well. She states that generally she sleeps well but some nights she does not.     Home BP:  90-110/60s.Reports getting dizzy when bending down or standing up. States BP was 80s/60s in November, BP meds adjusted by cardiology.  Now taking one half tab of amlodipine -valsartan  daily.  Also on metoprolol  50 mg daily, torsemide  20 mg daily.  Most Recent BP Reading(s)  06/14/24 : 120/67  05/09/24 : 130/62  04/25/24 : 107/62    Most Recent Weight Reading(s)  05/09/24 : 86.6 kg (191 lb)  04/25/24 : 88 kg (194 lb)  04/11/24 : 88.5 kg (195 lb)      Medications:    Current Outpatient Medications   Medication Instructions    albuterol  HFA 108 (90 Base) MCG/ACT inhaler 2 puffs, Inhalation, EVERY 6 HOURS PRN    amlodipine -valsartan  (EXFORGE ) 10-320 MG per tablet 0.5 tablets, Oral, DAILY    apixaban  (ELIQUIS ) 5 mg, Oral, 2 TIMES DAILY    atorvastatin  (LIPITOR) 80 mg, Oral, DAILY    Bimatoprost  (LUMIGAN ) 0.01 % SOLN 1 drop, Both Eyes, NIGHTLY    budesonide -formoterol  (SYMBICORT ,BREYNA ) 160-4.5 MCG/ACT inhaler 2 puffs, Inhalation, 2 TIMES DAILY, Use 1 puff prn during the day. Rinse mouth after use.    calcium  citrate-citamin D (CALCIUM  CITRATE +) 315-5 MG-MCG tablet 1 tablet, Oral, 2 TIMES DAILY    cetirizine  (ZYRTEC ) 10 mg, Oral, DAILY    cholecalciferol  (VITAMIN D3) 2,000 Units, Oral, 2 TIMES DAILY    clopidogrel  (PLAVIX ) 75 mg, Oral, DAILY    escitalopram  (LEXAPRO ) 10 mg, Oral, DAILY, With breakfast    ezetimibe  (ZETIA ) 10 mg, Oral, DAILY    famotidine  (PEPCID ) 40 mg, Oral, Nightly    ferrous  sulfate 325 mg, Oral, EVERY OTHER DAY    levothyroxine  (SYNTHROID ) 50 mcg, Oral, EVERY MORNING BEFORE BREAKFAST    LORazepam  (ATIVAN ) 0.5 mg, Oral, 2 TIMES DAILY    metoprolol  (TOPROL -XL) 50 mg, Oral, DAILY    nitroGLYCERIN  (NITROSTAT ) 0.4 mg, Sublingual, EVERY 5 MIN PRN    pantoprazole  (PROTONIX ) 40 mg, DAILY    polyethylene glycol (GLYCOLAX ) 17 g, Oral, DAILY    torsemide  (DEMADEX ) 20 mg, Oral, DAILY            Objective :  Physical Exam Vitals:BP 120/67   Pulse 79   Temp 97.4 F (36.3 C) (Temporal)   SpO2 92%    A physical  exam was not performed due to the nature of this telehealth virtual visit.    Labs  Lab Results   Component Value Date    NA 138 04/11/2024    K 3.8 04/11/2024    CO2 28 04/11/2024    CREAT 1.6 (H) 04/11/2024    GFR 35 (L) 04/11/2024       Lab Results   Component Value Date    PHOS 3.4 10/20/2023    CA 9.3 04/11/2024    PTHINTACTWC 124 (H) 10/20/2023    VITD25H 34 10/20/2023        Lab Results   Component Value Date    HGB 10.3 (L) 03/14/2024        ALB/CREAT RATIO URINE RAN (ug/mg)   Date Value   10/20/2023 23   05/19/2023 TNP   04/07/2023 37 (H)       Duplex Renal Arteries- 01/19/23  Conclusions:  Right - The renal artery has evidence of 60-99% stenosis. RI values are elevated indicating intrinsic disease. Kidney length is normal. Renal vein is patent.  Left - The renal artery has evidence of 60-99% stenosis. RI values are elevated indicating intrinsic disease. Kidney length is normal. Renal vein is patent.  Note: SMA has evidence of elevated velocities indicating greater than 70% stenosis.    Assessment and Plan    Proteinuric CKD IIIa: Likely due to longstanding difficult to control hypertension in the setting of bilateral renal artery stenosis.  We discussed the importance of good blood pressure control to prevent the progression of CKD.  Recommend low-salt diet, less than 2 g/day.  Continue ARB for antiproteinuric effect.  Ordered repeat BMP    #BP/Volume management:  Well controlled on current regimen overall, though she states when she os nervous her blood pressure goes up.  -Advised pt to check BPs at home and keep a log.   -Goal: < 130/80  -Continue amlodipine -valsartan  (exforge ) 5-320 mg daily, spironolactone  50 mg daily, chlorthalidone  25 mg daily, torsemide  10 mg daily    #Anemia of CKD:   On ferrous sulfate  325 mg every other day.   - Ordered repeat CBC, iron studies    #Mineral Bone Disease:   - Continue cholecalciferol  2000 units daily    #Electrolytes/Acid-base:  - Lytes in good order on last labs.  BMP ordered.       Patient to return in: 4 weeks, sooner     No orders of the defined types were placed in this encounter.    There are no Patient Instructions on file for this visit.      Thank you for involving me in the care of this patient. Please do not hesitate to reach out with any questions.    Alan Mule, DO    Pager ID: [phone]    [facility]  [address]  [place], [place] [address]  [phone]

## 2024-06-20 ENCOUNTER — Ambulatory Visit

## 2024-06-26 ENCOUNTER — Other Ambulatory Visit (HOSPITAL_BASED_OUTPATIENT_CLINIC_OR_DEPARTMENT_OTHER): Payer: Self-pay

## 2024-06-26 ENCOUNTER — Ambulatory Visit: Payer: Self-pay

## 2024-06-26 DIAGNOSIS — F4310 Post-traumatic stress disorder, unspecified: Secondary | ICD-10-CM | POA: Diagnosis present

## 2024-06-26 DIAGNOSIS — F411 Generalized anxiety disorder: Secondary | ICD-10-CM | POA: Insufficient documentation

## 2024-06-26 DIAGNOSIS — F331 Major depressive disorder, recurrent, moderate: Secondary | ICD-10-CM | POA: Diagnosis present

## 2024-06-26 DIAGNOSIS — R1084 Generalized abdominal pain: Secondary | ICD-10-CM

## 2024-06-26 DIAGNOSIS — F6389 Other impulse disorders: Secondary | ICD-10-CM | POA: Diagnosis present

## 2024-06-26 NOTE — Telephone Encounter (Signed)
 PER who is calling: Pharmacy, Sara Snyder is a 69 year old female has requested a refill of       gavilax.             Last Office Visit  : 04/25/2024 [name], MD      Last Tele Visit:  03/24/2024 Ulla Houston, PA-C      Last Physical Exam:  06/13/2020    PAP SMEAR due on 04/09/2024    Other Med Adult:  Most Recent BP Reading(s)  06/14/24 : 120/67        Cholesterol (mg/dL)   Date Value   89/78/7974 100     LOW DENSITY LIPOPROTEIN DIRECT (mg/dL)   Date Value   89/78/7974 49     HIGH DENSITY LIPOPROTEIN (mg/dL)   Date Value   89/78/7974 44     TRIGLYCERIDES (mg/dL)   Date Value   89/78/7974 106         THYROID  SCREEN TSH REFLEX FT4 (uIU/mL)   Date Value   10/20/2023 3.430         TSH (THYROID  STIM HORMONE) (uIU/mL)   Date Value   01/14/2021 7.090 (H)       HEMOGLOBIN A1C (%)   Date Value   12/03/2022 6.9 (H)       POC HEMOGLOBIN A1C (%)   Date Value   10/20/2023 6.5 (H)         INR (no units)   Date Value   12/10/2023 1.8       SODIUM (mmol/L)   Date Value   06/14/2024 140       POTASSIUM (mmol/L)   Date Value   06/14/2024 4.1           CREATININE (mg/dL)   Date Value   87/75/7974 1.4 (H)       Documented patient preferred pharmacies:    [facility] [place],  - [address]  Phone: [phone] Fax: [phone]

## 2024-06-26 NOTE — Progress Notes (Signed)
 ADULT OUTPATIENT PSYCHIATRY (OPD)  PSYCHOPHARM FOLLOW UP    BH TREATMENT TEAM:  GERIATRICS    LANGUAGE OF CARE:    Portuguese ([place]ian)      LANGUAGE NEEDS MET:    Telephone Interpreter    [place] #[id number]    Visit conducted via televisit with video    CHIEF COMPLAINT: I'm doing well      INTERVAL HISTORY: Sara Snyder is a 69 year old woman with PPH of MDD, GAD, panic, insomnia and gaming disorder.  PMH includes CAD, s/p stent, HLD, HTN, Afib with RVR, stg 3a CKD and fibromyalgia.  Engagement with [facility] psychiatry began in 2019.  She has had multiple providers for psychotherapy and psycho pharm.                                                                                                                                                                                                                                                                                                                                                                                          At the initial evaluation on 05/30/24 Escitalopram  and Lorazepam  were continued without change.      On Interview Patient Reports:  -Was with family for the holidays-everything was nice  -Adherent with medication-dtr bought a pillbox  -Plays video games and watches Youtube videos; helps to keep her mind busy and to not think about her past; Has played video game, [employer] for many years (in the game she takes care of the animals; feels  badly that other people had parents but she didn't  -Interested in therapy (states she has been asking for years)  Collateral from Daughter:  -Bought a medi-planner for patient, which she is using; she is medication adherent    Psychiatric Review of Systems:  Depression: Denies depression sxs; Denies SI   Mania/Hypomania: Denies symptoms  Anxiety: Denies, states she has been feeling calm  PTSD: +Trauma hx-has nightmares at times, doesn't remember them; +flashbacks (denies other symptoms)  OCD: Obsessions-denies;  compulsions-denies  Psychosis: AH-denies; VH- denies; Delusions-denies  Eating disorder: Denies restricting, bingeing or purging behaviors  Substance Use: Denies any substances; coffee- 2 cups           PAST PSYCHIATRIC HISTORY: (From Dr. Arch consult note 01/18/24)  Diagnostic History: PTSD, panic disorder, recurrent MDD, gaming disorder  History of Psychiatric Hospitalizations: denied  Past Medication Trials:  Past trials include:  Sertraline    Clonazepam   Trazodone   Venlafaxine   Duloxetine   Amitriptyline    Escitalopram   Gabapentin    Lorazepam    No trials on bupropion, antipsychotics, buspirone  Outpatient Care: [place] since 2019, multiple providers for psychotherapy and psychopharm-  Hx of SI: denied  Hx of SIB: denied  Hx of SA: denied  Hx of HI: denied  Hx of violence: denied    PSYCHOSOCIAL: (From Dr. Arch consult note 01/18/24)  Originally from [place], [place]. [place]oved to the [place]  [place] ago. Has 4 daughters, close to them and they help her with daily activities. Lives with 2 of them and their family. 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 Stays home and take care of her grandchildren.    OBJECTIVE DATA:  CURRENT MEDICATIONS:    Current Outpatient Medications   Medication Sig    GAVILAX 17 GM/SCOOP powder Take 17 g by mouth daily    cetirizine  (ZYRTEC ) 10 MG tablet Take 1 tablet by mouth daily    famotidine  (PEPCID ) 40 MG tablet Take 1 tablet by mouth at bedtime    budesonide -formoterol  (SYMBICORT ,BREYNA ) 160-4.5 MCG/ACT inhaler Inhale 2 puffs into the lungs in the morning and 2 puffs before bedtime. Use 1 puff prn during the day. Rinse mouth after use.    albuterol  HFA 108 (90 Base) MCG/ACT inhaler Inhale 2 puffs into the lungs every 6 (six) hours as needed for Wheezing    clopidogrel  (PLAVIX ) 75 MG tablet Take 1 tablet by mouth daily    amlodipine -valsartan  (EXFORGE ) 10-320 MG per tablet Take 0.5 tablets by mouth daily    escitalopram  (LEXAPRO ) 10 MG tablet Take 1 tablet by mouth in the  morning. With breakfast.    pantoprazole  (PROTONIX ) 40 MG tablet Take 40 mg by mouth daily    nitroGLYCERIN  (NITROSTAT ) 0.4 MG sublingual tablet Place 1 tablet under the tongue every 5 (five) minutes as needed for Chest pain    ezetimibe  (ZETIA ) 10 MG tablet Take 1 tablet by mouth daily    atorvastatin  (LIPITOR) 80 MG tablet Take 1 tablet by mouth daily    Bimatoprost  (LUMIGAN ) 0.01 % SOLN Place 1 drop into both eyes nightly    apixaban  (ELIQUIS ) 5 MG po tablet Take 1 tablet by mouth in the morning and 1 tablet before bedtime.    torsemide  (DEMADEX ) 10 MG tablet Take 2 tablets by mouth daily    metoprolol  (TOPROL -XL) 50 MG 24 hr tablet Take 1 tablet by mouth daily    levothyroxine  (SYNTHROID ) 50 MCG tablet Take 1 tablet by mouth every morning before breakfast    ferrous sulfate  325 (65 FE) MG tablet Take 1 tablet by mouth every other day    cholecalciferol  (VITAMIN D3)  2000 UNIT tablet Take 1 tablet by mouth in the morning and 1 tablet before bedtime. (Patient not taking: Reported on 09/14/2023)    calcium  citrate-citamin D (CALCIUM  CITRATE +) 315-5 MG-MCG tablet Take 1 tablet by mouth in the morning and 1 tablet before bedtime.     No current facility-administered medications for this visit.         VITAL SIGNS:   There were no vitals filed for this visit.    LABS  WHITE BLOOD CELL COUNT (TH/uL)   Date Value   06/14/2024 6.2     RED BLOOD CELL COUNT (M/uL)   Date Value   06/14/2024 3.84 (L)     HEMOGLOBIN (g/dL)   Date Value   87/75/7974 10.7 (L)     No results found for: IDEALHCT  HEMATOCRIT (%)   Date Value   06/14/2024 33.3 (L)     No results found for: HCTDIFF  MEAN CORPUSCULAR VOL (fl)   Date Value   06/14/2024 86.7     MEAN CORPUSCULAR HGB (pg)   Date Value   06/14/2024 27.9     MEAN CORP HGB CONC (g/dL)   Date Value   87/75/7974 32.1     No results found for: RDW  PLATELET COUNT (TH/uL)   Date Value   06/14/2024 247     MEAN PLATELET VOLUME (fL)   Date Value   06/14/2024 10.6     NEUTROPHIL % (%)   Date  Value   01/11/2024 57.2     LYMPHOCYTE % (%)   Date Value   01/11/2024 33.9     MONOCYTE % (%)   Date Value   01/11/2024 5.7     EOSINOPHIL % (%)   Date Value   01/11/2024 2.4     BASOPHIL % (%)   Date Value   01/11/2024 0.3       Glucose Random (mg/dL)   Date Value   87/75/7974 89     BUN (UREA NITROGEN) (mg/dL)   Date Value   87/75/7974 22 (H)     CALCIUM  (mg/dL)   Date Value   87/75/7974 9.2     CHLORIDE (mmol/L)   Date Value   06/14/2024 99     CARBON DIOXIDE (mmol/L)   Date Value   06/14/2024 30     ANION GAP (mmol/L)   Date Value   06/14/2024 11     CREATININE (mg/dL)   Date Value   87/75/7974 1.4 (H)     POTASSIUM (mmol/L)   Date Value   06/14/2024 4.1     SODIUM (mmol/L)   Date Value   06/14/2024 140     ESTIMATED GLOMERULAR FILT RATE (ML/MIN)   Date Value   06/14/2024 40 (L)             ALANINE AMINOTRANSFERASE (U/L)   Date Value   01/11/2024 22   12/10/2023 17   10/20/2023 17     ASPARTATE AMINOTRANSFERASE (U/L)   Date Value   01/11/2024 23   12/10/2023 19   10/20/2023 21     ALKALINE PHOSPHATASE (U/L)   Date Value   01/11/2024 125 (H)   12/10/2023 137 (H)   10/20/2023 120 (H)     HEMOGLOBIN A1C (%)   Date Value   12/03/2022 6.9 (H)   05/25/2022 6.3 (H)   02/24/2022 6.4 (H)     POC HEMOGLOBIN A1C (%)   Date Value   10/20/2023 6.5 (H)     Cholesterol (mg/dL)  Date Value   04/11/2024 100   03/14/2024 162   12/10/2023 132     LOW DENSITY LIPOPROTEIN DIRECT (mg/dL)   Date Value   89/78/7974 49   03/14/2024 105   12/10/2023 80     HIGH DENSITY LIPOPROTEIN (mg/dL)   Date Value   89/78/7974 44   03/14/2024 42   12/10/2023 39 (L)     TRIGLYCERIDES (mg/dL)   Date Value   89/78/7974 106   03/14/2024 149   12/10/2023 140       THYROID  SCREEN TSH REFLEX FT4 (uIU/mL)   Date Value   10/20/2023 3.430     VITAMIN B12 (pg/mL)   Date Value   10/20/2023 276   01/24/2020 311   07/20/2019 329       VITAMIN D ,25 HYDROXY (ng/mL)   Date Value   06/14/2024 33       EKG REPORT 10/26/23   Test Reason :    Vent. Rate :  78 BPM      Atrial Rate :  78 BPM       P-R Int : 166 ms          QRS Dur : 104 ms        QT Int : 396 ms       P-R-T Axes :  45 -18  49 degrees      QTcB Int : 451 ms         Normal sinus rhythm    Moderate voltage criteria for LVH, may be normal variant ( R in aVL , Cornell product )    Cannot rule out Anterior infarct , age undetermined    Abnormal ECG    When compared with ECG of 20-Oct-2023 09:58,    ST less depressed in Lateral leads        MRI Brain WO Contrast  Status: Final result   Study Result 01/24/20    Narrative & Impression   CLINICAL INDICATION: memory difficulties     COMPARISON: None.     TECHNIQUE: Brain MRI performed using sagittal T1 weighted, axial T1, T2, FLAIR, gradient-echo and diffusion sequences.      FINDINGS:     IMPRESSION:      Brain: There is no hemorrhage, acute infarct, or mass. There are moderate FLAIR hyperintense foci in the periventricular/subcortical white matter. Mild patchy FLAIR hyperintensity in descending white matter tracts in brainstem. There is a small chronic white matter lacunar infarct adjacent to the posterior right lateral ventricle. 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     Ventricles and CSF spaces: Unremarkable     Orbits: Unremarkable     Paranasal sinuses and mastoids: Well aerated     Vasculature: Flow voids of major vessels the base of brain are present.     IMPRESSION:  1. No acute intracerebral pathology.  2. White matter signal abnormalities, nonspecific, likely sequela of chronic small vessel ischemic changes. Small chronic white matter lacunar infarct.              CURRENT TREATMENT/CONTACT INFO FOR OTHER AGENCIES AND MENTAL HEALTH PROVIDERS (if applicable):     Contact/Community Support    No documentation.              COLUMBIARISK ASSESSMENTS:     Suicide:  CSSR OP LAST CONTACT      Flowsheet Row BH Office Visit from 05/30/2024 in [facility] [place] [facility]   Have you wished you were dead or wished you could go  to sleep and not wake up? No   Have you actually had any  thoughts of killing yourself?  No   Have you done anything, started to do anything, or prepared to do anything to end your life? No   C-SSRS OP Last Contact Screener Risk Score No Risk      C-SSRS Risk Assessment    No documentation.                 VIOLENCE:    Violence/Abuse Risk    No documentation.              ADDITIONAL SCREENINGS:       04/25/2024 01/18/2024 09/14/2023   PHQ-9 Screening   Anhedonia 1  3  1     Low mood/hopelessness 1  3  1     Sleep disturbance  3  1    Fatigue  3  1    Appetite disturbance  0  0    Low self-worth  3  0    Impaired concentration  3  0    Psychomotor retardation/agitation  0  0    Suicidal ideation  0  0    PHQ-9 TOTAL  18  4        Patient-reported            04/25/2024 01/18/2024 09/14/2023   GAD-7 Screening   Nervousness 3  3  1     Uncontrollable worry 1  3  1     Excessive worry  3  1    Trouble relaxing  3  1    Restlessness  2  0    Irritability  3  1    Fears of future  3  1    Difficulty functioning  Very difficult  Not difficult at all    GAD-7 Score  20  6        Patient-reported           04/25/2024 01/18/2024 09/14/2023   ALCOHOL/DRUG SCREENING   Frequency of 4+ drinks/day past year (Over 65) 0      Frequency of drug use past year 0  0  0        Patient-reported           11/17/2017   ALCOHOL (AUDIT) Screening   Frequency of 4+ drinks/day 0        Data saved with a previous flowsheet row definition         04/25/2024 01/18/2024 09/14/2023   DRUG (DAST) Screening   Drug use frequency 0  0  0        Patient-reported         02/17/2024 01/11/2024 12/03/2022   SDOH Screening   Patient willing to answer questions No No Yes    Worry food will run out   Never    Food won't last   Never    Threatend to shut utilities   No    Trouble getting transportation   No    Living situation today   I have a steady place to live.    Internet access   Yes, on a computer,Yes, on my phone    Experienced violence   No    OK for [place] to reach out   No    Referral to community programs   No         Patient-reported       REVIEW OF SYSTEMS:  REVIEW OF SYSTEMS (Prescribers Only):  .:  All systems reviewed and negative unless noted otherwise.  Musculoskeletal: pain     MENTAL STATUS EXAMINATION:   Mental Status Exam - 06/26/24 1619          Mental Status Exam     General Appearance Dressed appropriately     Behavior Cooperative     Level of Consciousness Alert     Orientation Level Grossly intact     Attention/Concentration WNL     Mannerisms/Movements No abnormal mannerisms/movements     Speech Quality and Rate WNL     Speech Clarity Clear     Speech Tone Normal vocal inflection     Vocabulary/Fund of Knowledge WNL     Memory Grossly intact     Thought Process & Associations Organized;Logical;Linear     Dissociative Symptoms None     Thought Content No abnormalities reported or observed     Hallucinations None     Suicidal Thoughts None     Homicidal Thoughts None     Mood Anxious;Content     Affect Congruent with mood     Judgment Fair     Insight Fair                RISK ASSESSMENTS:       Violence: low (1)     Addiction: low (1)     BIO/PSYCHO/SOCIAL AND RISK FORMULATION(S): Sara Snyder is a 69 year old woman with PPH of MDD, GAD, panic, insomnia and gaming disorder.   PMH includes CAD, s/p stent, HLD, HTN, Afib with RVR, stg 3a CKD and fibromyalgia.  Engagement with [facility] psychiatry began in 2019.  She has had multiple providers for psychotherapy and psycho- pharm.                                                                        Today patient reports mood is good and anxiety is better. She denies depression symptoms.  No SI. No overt psychosis.  She has a h/o trauma; reports nightmares at times, doesn't always remember, but there have been dreams where she is fighting with someone; she also reports flashbacks. She states doctors help (?therapists). Anxiety and depression symptoms managed with Current medication regimen of escitalopram  10 mg and lorazepam  0.5 mg BID.  No safety concerns arose during the  assessment.        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 Discussion included the following key points: purpose of meds; potential side effects, including black-box warning when appropriate, disease process; how to reach this writer, how to reach EMS if feeling at risk to harm self or others.      COUNSELING  AND COORDINATION OF CARE PROVIDED:   I spent a total of 30 minutes on this visit on the date of service (total time includes all activities performed on the date of service)    CARE PLAN/ EPISODES:  No linked episodes             Avelina DELENA Landy Ole, APRN

## 2024-06-27 ENCOUNTER — Other Ambulatory Visit: Payer: Self-pay

## 2024-06-27 ENCOUNTER — Encounter (HOSPITAL_BASED_OUTPATIENT_CLINIC_OR_DEPARTMENT_OTHER): Payer: Self-pay

## 2024-06-27 ENCOUNTER — Ambulatory Visit

## 2024-06-27 VITALS — BP 121/72 | HR 72 | Temp 97.3°F | Wt 195.0 lb

## 2024-06-27 DIAGNOSIS — N2581 Secondary hyperparathyroidism of renal origin: Secondary | ICD-10-CM | POA: Insufficient documentation

## 2024-06-27 DIAGNOSIS — E66813 Obesity, class 3: Secondary | ICD-10-CM | POA: Diagnosis not present

## 2024-06-27 DIAGNOSIS — R0609 Other forms of dyspnea: Secondary | ICD-10-CM | POA: Insufficient documentation

## 2024-06-27 DIAGNOSIS — J4540 Moderate persistent asthma, uncomplicated: Secondary | ICD-10-CM | POA: Diagnosis not present

## 2024-06-27 DIAGNOSIS — I5032 Chronic diastolic (congestive) heart failure: Secondary | ICD-10-CM | POA: Diagnosis not present

## 2024-06-27 DIAGNOSIS — I2510 Atherosclerotic heart disease of native coronary artery without angina pectoris: Secondary | ICD-10-CM | POA: Diagnosis not present

## 2024-06-27 DIAGNOSIS — M85852 Other specified disorders of bone density and structure, left thigh: Secondary | ICD-10-CM | POA: Diagnosis not present

## 2024-06-27 DIAGNOSIS — I4891 Unspecified atrial fibrillation: Secondary | ICD-10-CM | POA: Insufficient documentation

## 2024-06-27 DIAGNOSIS — Z6841 Body Mass Index (BMI) 40.0 and over, adult: Secondary | ICD-10-CM | POA: Insufficient documentation

## 2024-06-27 DIAGNOSIS — K2930 Chronic superficial gastritis without bleeding: Secondary | ICD-10-CM | POA: Insufficient documentation

## 2024-06-27 DIAGNOSIS — N1832 Chronic kidney disease, stage 3b: Secondary | ICD-10-CM | POA: Diagnosis not present

## 2024-06-27 DIAGNOSIS — E559 Vitamin D deficiency, unspecified: Secondary | ICD-10-CM | POA: Insufficient documentation

## 2024-06-27 DIAGNOSIS — I701 Atherosclerosis of renal artery: Secondary | ICD-10-CM | POA: Insufficient documentation

## 2024-06-27 DIAGNOSIS — L853 Xerosis cutis: Secondary | ICD-10-CM | POA: Diagnosis present

## 2024-06-27 DIAGNOSIS — I959 Hypotension, unspecified: Secondary | ICD-10-CM | POA: Insufficient documentation

## 2024-06-27 DIAGNOSIS — I209 Angina pectoris, unspecified: Secondary | ICD-10-CM | POA: Insufficient documentation

## 2024-06-27 DIAGNOSIS — M797 Fibromyalgia: Secondary | ICD-10-CM | POA: Insufficient documentation

## 2024-06-27 MED ORDER — CALCIUM CITRATE-VITAMIN D 315-5 MG-MCG PO TABS: 1.0000 | ORAL_TABLET | Freq: Two times a day (BID) | ORAL | 3 refills | Status: AC

## 2024-06-27 MED ORDER — AMMONIUM LACTATE 12 % EX CREA: TOPICAL_CREAM | CUTANEOUS | 5 refills | Status: AC | PRN

## 2024-06-27 MED ORDER — AMLODIPINE BESYLATE-VALSARTAN 5-160 MG PO TABS: 1.0000 | ORAL_TABLET | Freq: Every day | ORAL | 0 refills | Status: AC

## 2024-06-27 MED ORDER — PANTOPRAZOLE SODIUM 40 MG PO TBEC: 40.0000 mg | DELAYED_RELEASE_TABLET | Freq: Every day | ORAL | 3 refills | Status: AC

## 2024-06-27 NOTE — Assessment & Plan Note (Signed)
-   reinforced her pulmonary funtion is good,   - Continue current asthma inhalers.  - Encouraged regular exercise to improve endurance.

## 2024-06-27 NOTE — Assessment & Plan Note (Signed)
 Reviewed may ot need serial imaging with ultrasound as she has bee evaluated by Vascular and advised against further intervention due to poor candidacy for surgery.  - overall renal function is stable and hypertension controlled which was reviewed as priority with patient and family

## 2024-06-27 NOTE — Assessment & Plan Note (Signed)
 Continue conditioning ,start more variety of exercises

## 2024-06-27 NOTE — Assessment & Plan Note (Signed)
 HR 70s today on BB

## 2024-06-27 NOTE — Progress Notes (Signed)
 HPI  Sara Snyder is a 69 year old female for scheduled appointment    Interview was in English without interpreter    History of Present Illness  Sara Snyder is a 69 year old female with asthma and hypertension who presents for follow-up of her respiratory symptoms and blood pressure management. She is accompanied by her daughters, including Sara Snyder and Sara Snyder.  Last visit daughter Sara Snyder was here    Respiratory symptoms  - Persistent wheezing described as a little kitty meowing in the chest  - Comfortable breathing at rest  - Shortness of breath and fatigue with physical activity  - Engages in stationary bicycling at home for 30 to 40 minutes almost daily, including weekends    Blood pressure abnormalities  - Blood pressure frequently low in the evenings, ranging from 80/60 to 90/60 mmHg  - Associated symptoms include fatigue and sleepiness  - Takes antihypertensive medications, including torsemide , in the late afternoon around 5 to 6 PM    Musculoskeletal pain  - Significant pain prompting referral to orthopedist  - Unable to take anti-inflammatory medications due to kidney concerns  - Uses Tylenol  without relief  - Topical gels have not provided benefit    Dermatologic findings  - Very dry skin on hands and legs  - Powdery residue present on clothing  - Symptoms have been persistent    Social History    Social History Narrative      Originally from [place], [place]      History of childhood trauma      Lives with family      The combined HPI and assessment and plan are below; with any medical information and history updated and reviewed.     Physical Exam  BP 121/72 (Site: LA, Position: Sitting, Cuff Size: Lrg)   Pulse 72   Temp 97.3 F (36.3 C) (Temporal)   Wt 88.5 kg (195 lb)   SpO2 97%   BMI 39.39 kg/m   Pain Score: Data Unavailable      Comfortable appearing, put together older  woman,   EOMI   Alert and oriented to person, place, and time  Appropriate mood, affect, tone.Sara Snyder eye contact, pleasant      Assessment and Plan    Assessment & Plan  Hypotension  Blood pressure readings in the 80s/60s to 90s/60s, particularly in the evening per daughter Sara Snyder. Symptoms include weakness and dizziness. C  - Decreased blood pressure medication dose.- can titrate to 1/2 tab lower dose if needed  - Monitor blood pressure daily, preferably in the morning.  - Provided a blood pressure diary for tracking= bring to appointment with Dr Brooks/Cardiology  - if persistently low can d/c calcium  channel blocker     Xerosis cutis  Dry skin with flaking, particularly on hands and legs. Skin appears very thin.  - Prescribed medicated cream for dry skin, to be applied morning and night, especially after showering.    Problem List           Diagnosed       High    DOE (dyspnea on exertion)     Overview   Has chronic DOE   Her symptoms mildly improved with a cardiac catheterization in October 2025 for this reason they persist.  - I believe this is multifactorial partly driven by stressors, partly deconditioning after recent revascularization,  And most impoprtantly=  pulmonary  Her CT February 17, 2024 does demonstrate multiple scattered granulomatous disease so this may be contributing as  well.  She has follow-up with pulmonary and I reviewed that this will be important    Symptoms may be exacerbated by recent cardiac stress and recovery.    Appreciate pulmonary eval- moderate persistent asthma     Biking- every day 30-40 min         Current Assessment & Plan   Per daughter Sara Snyder much better           Atrial fibrillation with RVR (HCC)     Overview   10/2023 new diagnosis PAF- on cardiac monitoring /Zio for symptoms of palpitatons. Afib was low burden 2% on zio.   -TSH normal 09/2023  - Continue metoprolol  to 50 mg  -Continue Eliquis   Start Toprol -XL 25 mg daily  Eliquis  5 mg twice daily    sig improved w metoprolol - in sinus today         Current Assessment & Plan   HR 70s today on BB         Coronary artery disease involving native  coronary artery of native heart     Overview   03/22/2024 CAD s/p DES x 4 to codominant RCA at [facility]     Plan for apixiban and plavix  for at least 6 months- d/c ASA     She has strong mood symptoms and anxiety around her cardiovascular health, despite the procedure.   After revascularization she had some improvement in symptoms but not all, likely these are multifactorial    Cardiac perspective her symptoms are stable and she is medically optimized. A lot of time was spent around reassurance and explaining the benefit of an elective revascularization vs an emergent one, and how to continue to recover from here.              Current Assessment & Plan   Continue conditioning ,start more variety of exercises          Relevant Medications    amlodipine -valsartan  (EXFORGE ) 5-160 MG per tablet       Medium    Osteopenia     Overview   DEXA 07/2020 with max T score -1.8 I left femoral neck   Plan for repeat in q3-5y.   Vit D /Ca    Reviewed weight bearing exercises and osteoporosis videos daughters can set up for her on youtube         Relevant Medications    calcium  citrate-citamin D (CALCIUM  CITRATE +) 315-5 MG-MCG tablet    Gastritis without bleeding     Overview   EGD 05/12/2023 with gastritis pathology.consistent with mild chronic inactive gastritis    On Pantoprazole  daily AM - reviewed proper dosing  And H2 blocker nightly          Relevant Medications    pantoprazole  (PROTONIX ) 40 MG tablet    Chronic heart failure with preserved ejection fraction (HCC)     Overview   Metprolol, Valsartan   Torsemide  20 mg qhs     TTE 01/2024: LEF 55%, no wall motion abnormality, severe LA dilation, grade III diastolic dysfunction. The LV end diastolic pressure is elevated.              Current Assessment & Plan   Well-controlled with current medications, compensated exam today  Reassurance around cardiac health  - Continue current heart failure medications.  - decrease dosage of calcium  channel blocker/ARB - SBP 80s-90s. Gave  log- if persistently low can d/c calcium  channel blocker and contnue ARB   - Encouraged regular exercise, including daily  cycling and stair climbing.         Relevant Medications    amlodipine -valsartan  (EXFORGE ) 5-160 MG per tablet       Unprioritized    Fibromyalgia     Overview   Pain in multiple locations (back, hips, wrists, elbows, knees), difficulty walking and sleeping d/t pain, rheum labs positive for elevated CRP. Rheum evaluation without clear rheumatologic condition, suspect fibromyalgia, though requested to be sent back to rheumatology if any joint swelling associated with pain given positive CRP.  Trialing gabapentin  for improved pain control (will avoid Lyrica while mood symptoms remain uncontrolled).  Additionally, plan for taper off venlafaxine  and initiation of duloxetine  due to slightly better evidence for duloxetine  and fibromyalgia and Ludmilla and her daughter feel venlafaxine  is not helping.  Provided extensive education on fibromyalgia and need for all to pull interventions including increased exercise (referred to PT), follow-up with psychiatry for therapy and pharmacotherapy, and sleep hygiene.         Current Assessment & Plan   R shoulder pain is limiting  Physical therapy options are very limited with daughters work scehdule  Can try for home physical therapy  Renal MD put in ortho referral- they are interested in injection          Class 3 severe obesity with body mass index (BMI) of 40.0 to 44.9 in adult, unspecified obesity type, unspecified whether serious comorbidity present (HCC)     Stage 3b chronic kidney disease (HCC)     Overview   Likely d/t HTN and renal artery stenosis.   -  Cr/eGFR stable on labs  05/2024 by Renal (1.4/40)     Hypertension is very well controlled, adjusting meds as below to avoid hypo-tension          Current Assessment & Plan   Managed with current medications. Nephrologist advised against regular use of anti-inflammatory medications due to kidney  function.  - Continue current kidney disease management.  - Avoid regular use of anti-inflammatory medications.         Moderate persistent asthma without complication     Overview   Dyspnea is likely multifactorial in the setting of asthma, CAD and likely some deconditioning.   Methacholine  challenge was positive, consistent w/ diagnosis of asthma. CAD is certainly also playing a role but she is now s/p DES so should help    2019 PFT: FEV1 to FVC ratio is normal at 80%. Lung volumes are all within normal limits, with the exception of a reduced expiratory   reserve volume. Diffusion capacity and exercise oximetry are within normal limits.  02/2024 Methacholine  challenge: airway hyperresponsiveness.         Current Assessment & Plan   - reinforced her pulmonary funtion is good,   - Continue current asthma inhalers.  - Encouraged regular exercise to improve endurance.         RESOLVED: Hypotension        Low    Secondary hyperparathyroidism (HCC)     Overview   Likely 2/2 CKD + Vit d deficiency  - Continue vitamin D    - Reviewed calcium  intake in diet , aim 2-3 serving per day and add calcium  supplements calcium  tablets 600 mg p.o. twice daily if not adecquate dietary calcium  intake  -   PTH and calcium  due 05/2024- by renal- reviewed          Relevant Medications    calcium  citrate-citamin D (CALCIUM  CITRATE +) 315-5 MG-MCG tablet    Vitamin  D deficiency     Overview   Vitamin D  low-  supplemented  Reviewed important for bone health. Counseled to not take more than prescribed given risk of toxicity            Bilateral renal artery stenosis [facility])     Overview   Noted 2022    2024 Renal dopplers-  The renal artery has evidence of 60-99% stenosis. RI values are elevated indicating intrinsic disease. Kidney length is normal. Renal vein is patent. The renal artery has evidence of 60-99% stenosis. RI values are elevated indicating intrinsic disease. Kidney length is normal. Renal vein is patent. SMA has evidence of  elevated velocities indicating greater than 70% stenosis.         Current Assessment & Plan   Reviewed may ot need serial imaging with ultrasound as she has bee evaluated by Vascular and advised against further intervention due to poor candidacy for surgery.  - overall renal function is stable and hypertension controlled which was reviewed as priority with patient and family             General health maintenance  Discussed the importance of regular exercise and stress reduction. Encouraged to maintain an active lifestyle and reduce stress.  - Encouraged regular exercise, including cycling and light weight exercises.  - Advised stress reduction techniques.  - Scheduled Pap test at next visit.     Time spent is 65 min on the day of service on:  Preparing to see the patient (e.g., review of tests)  and/or Obtaining and/or reviewing separately obtained history  and/or Performing a medically appropriate examination and/or evaluation  and/or Counseling and educating the patient, family, or caregiver  and/or Ordering medications, tests, or procedures  and/or Referring and communicating with other health care professionals (when not separately reported)  and/orDocumenting clinical information in the electronic or other health record  and/or Independently interpreting results (not separately reported) and communicating results to the patient, family, or caregiver  and/or Care coordination (not separately reported)

## 2024-06-27 NOTE — Assessment & Plan Note (Signed)
 Managed with current medications. Nephrologist advised against regular use of anti-inflammatory medications due to kidney function.  - Continue current kidney disease management.  - Avoid regular use of anti-inflammatory medications.

## 2024-06-27 NOTE — Assessment & Plan Note (Signed)
 Per daughter faustina much better

## 2024-06-27 NOTE — Assessment & Plan Note (Signed)
 R shoulder pain is limiting  Physical therapy options are very limited with daughters work scehdule  Can try for home physical therapy  Renal MD put in ortho referral- they are interested in injection

## 2024-06-27 NOTE — Assessment & Plan Note (Signed)
 Well-controlled with current medications, compensated exam today  Reassurance around cardiac health  - Continue current heart failure medications.  - decrease dosage of calcium  channel blocker/ARB - SBP 80s-90s. Gave log- if persistently low can d/c calcium  channel blocker and contnue ARB   - Encouraged regular exercise, including daily cycling and stair climbing.

## 2024-06-28 ENCOUNTER — Other Ambulatory Visit (LOCAL_COMMUNITY_HEALTH_CENTER): Payer: Self-pay

## 2024-06-28 DIAGNOSIS — Z79899 Other long term (current) drug therapy: Secondary | ICD-10-CM

## 2024-06-28 DIAGNOSIS — F411 Generalized anxiety disorder: Secondary | ICD-10-CM

## 2024-06-28 MED ORDER — LORAZEPAM 0.5 MG PO TABS: 0.5000 mg | ORAL_TABLET | Freq: Two times a day (BID) | ORAL | 0 refills | Status: AC

## 2024-07-01 ENCOUNTER — Other Ambulatory Visit (HOSPITAL_BASED_OUTPATIENT_CLINIC_OR_DEPARTMENT_OTHER): Payer: Self-pay | Admitting: Cardiovascular Disease

## 2024-07-01 DIAGNOSIS — N1831 Chronic kidney disease, stage 3a: Secondary | ICD-10-CM

## 2024-07-01 NOTE — Telephone Encounter (Signed)
 PER who is calling: Patient (self), Sara Snyder is a 69 year old female has requested a refill of       torsemide .        Only complete for controlled medication:    Previously prescribed:  Start Date          End Date              Last Office Visit  : 04/11/2024 Burnetta Geni CROME, MD      Last Tele Visit:  Visit date not found      Last Physical Exam:  06/13/2020    PAP SMEAR due on 04/09/2024    Other Med Adult:  Most Recent BP Reading(s)  06/27/24 : 121/72        Cholesterol (mg/dL)   Date Value   89/78/7974 100     LOW DENSITY LIPOPROTEIN DIRECT (mg/dL)   Date Value   89/78/7974 49     HIGH DENSITY LIPOPROTEIN (mg/dL)   Date Value   89/78/7974 44     TRIGLYCERIDES (mg/dL)   Date Value   89/78/7974 106         THYROID  SCREEN TSH REFLEX FT4 (uIU/mL)   Date Value   10/20/2023 3.430         TSH (THYROID  STIM HORMONE) (uIU/mL)   Date Value   01/14/2021 7.090 (H)       HEMOGLOBIN A1C (%)   Date Value   12/03/2022 6.9 (H)       POC HEMOGLOBIN A1C (%)   Date Value   10/20/2023 6.5 (H)         INR (no units)   Date Value   12/10/2023 1.8       SODIUM (mmol/L)   Date Value   06/14/2024 140       POTASSIUM (mmol/L)   Date Value   06/14/2024 4.1           CREATININE (mg/dL)   Date Value   87/75/7974 1.4 (H)       Documented patient preferred pharmacies:    [facility], [place] - [address]  Phone: [phone] Fax: [phone]

## 2024-07-03 ENCOUNTER — Ambulatory Visit (HOSPITAL_BASED_OUTPATIENT_CLINIC_OR_DEPARTMENT_OTHER): Payer: Self-pay

## 2024-07-04 ENCOUNTER — Telehealth (HOSPITAL_BASED_OUTPATIENT_CLINIC_OR_DEPARTMENT_OTHER): Payer: Self-pay

## 2024-07-04 ENCOUNTER — Telehealth (HOSPITAL_BASED_OUTPATIENT_CLINIC_OR_DEPARTMENT_OTHER): Payer: Self-pay | Admitting: Ambulatory Care

## 2024-07-04 NOTE — Telephone Encounter (Signed)
 Bimatoprost  (LUMIGAN ) 0.01 % SOLN [[id number]]     Requires PA

## 2024-07-04 NOTE — Telephone Encounter (Signed)
 Call placed via Portuguese Interpreter: [id number]. Spoke to patient's daughter and Rheta Dama who voiced an understanding.         Sara Alan Rana, MD to [facility] Pool  (Selected Message)      07/03/24 10:15 AM  Result Note  Dear RN,    Please:    1. Create Telephone encounter for this patient.  2. Share with the patient the attached results.  Please let the patient know her lab results are stable. Please provide the creatinine (measure of kidney function) as well as any other requested results    Plan:  1. No change in plan    2. Type of Outreach: 1 phone call and if unable to reach send letter    3. Document the conversation in the Telephone Encounter and close the encounter, no need to send back to me.    Thank you,  Alan Sara   Vitamin D ,25 Hydroxy; Ferritin; Total Iron Binding Capacity; Magnesium ; PTH Intact With Calcium  (5 more orders)

## 2024-07-07 ENCOUNTER — Other Ambulatory Visit (LOCAL_COMMUNITY_HEALTH_CENTER): Payer: Self-pay

## 2024-07-07 DIAGNOSIS — F6389 Other impulse disorders: Secondary | ICD-10-CM

## 2024-07-07 DIAGNOSIS — F331 Major depressive disorder, recurrent, moderate: Secondary | ICD-10-CM

## 2024-07-07 DIAGNOSIS — F4310 Post-traumatic stress disorder, unspecified: Secondary | ICD-10-CM

## 2024-07-07 DIAGNOSIS — F411 Generalized anxiety disorder: Secondary | ICD-10-CM

## 2024-07-10 NOTE — Progress Notes (Unsigned)
 CARDIOLOGY CLINIC CONSULT NOTE   Date of visit: 07/11/2024  Language: Portuguese phone interpreter used     History of present illness:   69 year old female with HTN, CKD 3, RAS, low burden of PAC and PVCS on holter, HL, overweight/obesity.  New diagnosis paroxysmal A-fib on monitor 5/25.  CAD s/p DES x 4 to codominant RCA at [facility] 03/22/2024.  Anxiety/depression.     See 02/01/2024 note for more details    Prior saw Dr Risser      10/26/23     epigastric discomfort into chest, random. Sometimes w chest discomfort, fatigue, fullness in stomach. Stress or emotional upset triggers.  Intermittent chest pain several times a day. cp not consistently related to physical activity or eating.  --stress and event monitor    12/10/23  worsening sob and fatigue x1 yr since trip to [place]  in 8/24 when she also had fast heart rate to 150s.  severe weakness and a rapid heartbeat exceeding 110 beats per minute   Dx PAF, started on metoprolol  and a blood thinner . Metoprolol  improved palpitations significantly but did not affect DOE and exertional fatigue.     --cath     12/31/23  increase torsemide  as recommended.  symptoms overall unchanged  -- Stop chlorthalidone , increase amlodipine , add ezetimibe , can trial NTG    02/01/24  In ER 01/11/2024 with back and abdominal discomfort.  Troponin 14 and 15.  Creatinine 1.6    Says first day of taking higher dose of amlodipine  had stomach that pain and shortness of breath lasting 1 day  difficulty walking due to fatigue and pain that starts in her hip and radiates upwards, leading to chest discomfort. She experiences dizziness, especially when bending down or looking up  No syncope  -- Trial of isosorbide , decrease amlodipine /valsartan  to half tab, echo 8/28.  If symptoms not improved refer for revascularization    Saw cards PA 03/07/24  -says imdur  didn't help so stopped. Set up for cath. Amlodipine /valsartan  back to prior dose       Abdominal pain has significantly improved since starting  pantoprazole  following a recent cardiac procedure. Prior to her heart procedure she was on omeprazole     04/11/24  Right after cath felt significantly better. Then a few days later most sxs returned.   Says both that symptoms are shorter, less intense and less frequent after cath and that she has only had mild improvement    They confirm she is taking clopidogrel  and Eliquis  and not aspirin   - Change atorvastatin  tonight, decrease amlodipine /valsartan  to half tab, labs, slowly do more exertion at home    07/11/24  After last visit called and rec for cardiac crehab - hard w transport but now doing stationary bike at home 30-40 mins  Saw pcp 1/6 -wheeze   Amloipdine 5 valsartan  160 from 10/320  Has Airway hyperresponsiveness.on methacholine  challenge 9/25  low blood pressure readings, leading to adjustments in her medication regimen. She recently started a new dose of valsartan , taking a full tablet of the decreased dose since yesterday, whereas she previously took half of the higher dose tablet.  She takes amlodipine /valsartan , ezetimibe , ezetimibe  and Eliquis  in the morning, calcium  and atorvastatin  in the afternoon, and torsemide , levothyroxine , Eliquis  night and pantoprazole  at am. Adjustments were made to her medication schedule due to stomach pain, with some medications moved to nighttime to alleviate discomfort   severe pain due to fibromyalgia, particularly in her knees and hips, which limits her physical  activity.   hopeful for an orthopedic eval  sob improved with increased physical activity, specifically biking for 30-40 minutes daily. She is motivated to continue exercising to aid in weight loss.  Smiling multiple times today, prior visits usually tearful  -no changes      Most Recent Pulse Reading(s)  07/11/24 : 76  06/27/24 : 72  06/14/24 : 79  05/09/24 : 75  04/25/24 : 72    Most Recent BP Reading(s)  07/11/24 : 104/66  06/27/24 : 121/72  06/14/24 : 120/67  05/09/24 : 130/62  04/25/24 : 107/62      Most  Recent Weight Reading(s)  06/27/24 : 88.5 kg (195 lb)  05/09/24 : 86.6 kg (191 lb)  04/25/24 : 88 kg (194 lb)  04/11/24 : 88.5 kg (195 lb)  03/07/24 : 86.2 kg (190 lb)       Patient Active Problem List:     Essential hypertension     Panic disorder     Hyperlipidemia     Mild episode of recurrent major depressive disorder (HCC)     Anxiety state     Psychophysiological insomnia     Secondary hyperparathyroidism (HCC)     Osteopenia     Fibromyalgia     Vitamin D  deficiency     Obesity (BMI 30-39.9)     Bilateral renal artery stenosis (HCC)     DOE (dyspnea on exertion)     Recurrent epistaxis     Other specified hypothyroidism     Cervical cancer screening     Prediabetes     Schistosomiasis     Abnormal mammogram     Tubular adenoma of colon     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 incontinence     Gastritis without bleeding     Atrial fibrillation with RVR (HCC)     Status post insertion of drug eluting coronary artery stent     Coronary artery disease involving native coronary artery of native heart     Generalized abdominal pain     Chronic heart failure with preserved ejection fraction (HCC)     Class 3 severe obesity with body mass index (BMI) of 40.0 to 44.9 in adult, unspecified obesity type, unspecified whether serious comorbidity present (HCC)     Stage 3b chronic kidney disease (HCC)     Angina pectoris (HCC)     Moderate persistent asthma without complication      Allergies:  Review of Patient's Allergies indicates:   Pollen extract          Cough    Meds:  Current Outpatient Medications   Medication Instructions    albuterol  HFA 108 (90 Base) MCG/ACT inhaler 2 puffs, Inhalation, EVERY 6 HOURS PRN    amlodipine -valsartan  (EXFORGE ) 5-160 MG per tablet 1 tablet, Oral, DAILY    ammonium lactate  (AMLACTIN) 12 % cream Topical, PRN    apixaban  (ELIQUIS ) 5 mg, Oral, 2 TIMES DAILY    atorvastatin  (LIPITOR) 80 mg, Oral, DAILY    Bimatoprost  (LUMIGAN ) 0.01 % SOLN 1 drop, Both Eyes, NIGHTLY    budesonide -formoterol  (SYMBICORT ,BREYNA )  160-4.5 MCG/ACT inhaler 2 puffs, Inhalation, 2 TIMES DAILY, Use 1 puff prn during the day. Rinse mouth after use.    calcium  citrate-citamin D (CALCIUM  CITRATE +) 315-5 MG-MCG tablet 1 tablet, Oral, 2 TIMES DAILY    cetirizine  (ZYRTEC ) 10 mg, Oral, DAILY    cholecalciferol  (VITAMIN D3) 2,000 Units, Oral, 2 TIMES DAILY    clopidogrel  (PLAVIX ) 75 mg, Oral, DAILY  escitalopram  (LEXAPRO ) 10 mg, Oral, DAILY, With breakfast    ezetimibe  (ZETIA ) 10 mg, Oral, DAILY    famotidine  (PEPCID ) 40 mg, Oral, Nightly    ferrous sulfate  325 mg, Oral, EVERY OTHER DAY    GAVILAX 17 GM/SCOOP powder Take 17 g by mouth daily    levothyroxine  (SYNTHROID ) 50 mcg, Oral, EVERY MORNING BEFORE BREAKFAST    LORazepam  (ATIVAN ) 0.5 mg, Oral, 2 TIMES DAILY    metoprolol  (TOPROL -XL) 50 mg, Oral, DAILY    nitroGLYCERIN  (NITROSTAT ) 0.4 mg, Sublingual, EVERY 5 MIN PRN    pantoprazole  (PROTONIX ) 40 mg, Oral, DAILY    torsemide  (DEMADEX ) 20 mg, Oral, DAILY       Social History:  mother of 5 and GM of 3   resides with her husband and one dau and one grandchild.   No tob, eoth, drugs    Family History:  Son - needs heart surgery, ?  CABG for cad, in [place]   Daughter -SVT ablation      ROS: All other systems reviewed were pertinently negative apart from the history of present illness.    PHYSICAL EXAM:   07/11/24  0903   BP: 104/66   Site: Left Arm   Position: Sitting   Cuff Size: Large   Pulse: 76   Temp: 97.5 F (36.4 C)   TempSrc: Temporal   SpO2: 94%   Weight: 87.5 kg (193 lb)   Height: 4' 11.5 (1.511 m)             0 lb (0 kg)  Body mass index is 38.33 kg/m.  General: NAD, well developed, well nourished.  HEENT: Oral mucosa is moist, no icterus, no pallor noted.  NECK: No observable jugular venous distention,  .  CHEST: Clear to auscultation, no crackles or rhonchi. Nontender.  HEART:  No heaves or lifts. Regular rate and rhythm.  1/6 SEM rusb.  No ectopy  ABDOMEN:  Soft. Nontender and nondistended.   EXTREMITIES: Warm and well perfused. No  edema. 2+ pulses.   NEURO: A&O X 3, moves all extremities. Grossly nonfocal.  PSYCHIATRIC: Mood and affect are appropriate. anxious      DATA:  LABS:   Lab Results   Component Value Date    NA 140 06/14/2024    K 4.1 06/14/2024    CL 99 06/14/2024    CO2 30 06/14/2024    BUN 22 (H) 06/14/2024    CREAT 1.4 (H) 06/14/2024    GLUCOSER 89 06/14/2024    LDL 49 04/11/2024    HDL 44 04/11/2024    TG 106 04/11/2024    ALT 22 01/11/2024    AST 23 01/11/2024    TSH 7.090 (H) 01/14/2021     NT-proBNP (pg/mL)   Date Value   04/11/2024 185 (H)   12/10/2023 210 (H)   10/20/2023 182 (H)      EKG: (on my personal review) 10/26/23 nsr, lvh in avl. Qrs 104. Minor st abnormalities lateral leads. Similar to prior.   07/11/24 nsr 67 bpm. Prwp. Likely lead plcmt    Holter 03/17/2019  Sinus at 70 with range 52 to 100 BPM.   Approximately 600 APC's = 0.6% of all beats, including 15 couplets and 7 short relatively slow runs.   Approximately 600 VPC's = 0.6% of all beats, including 2 couplets but no runs.   No symptoms.     Holter 10/08/20  Sinus at 79 with range 54 to 121 BPM.   140 APC's including 4  coupletes and one 5-beat run at 130 BPM.    500 VPC's = 0.5% fo all beats, with no couplets or runs.   No symptoms.     Lexiscan  stress 02/26/21  Up to 1 mm of flat ST depression developed in the inferolateral leads after Lexiscan  infusion.  There was no concomitant chest pain   Noinfarct or ischemia.  Normal cardiac wall motion and ejection fraction    01/19/23  Right - The renal artery has evidence of 60-99% stenosis. RI values are elevated indicating intrinsic disease. Kidney length is normal. Renal vein is patent.  Left - The renal artery has evidence of 60-99% stenosis. RI values are elevated indicating intrinsic disease. Kidney length is normal. Renal vein is patent.  Note: SMA has evidence of elevated velocities indicating greater than 70% stenosis.    Echo 01/19/23  1. LVEF 60% .  2.   no regional wall motion abnormalities.  3. grade II  diastolic dysfunction.  4. Left Atrium: Moderately dilated.  5. Aortic Valve:  mild sclerosis; no stenosis.  mild aortic regurgitation.  6.  RVSP  30 mmHg which is normal.  7.  no pericardial effusion    Zio 5/25  12 days 20 hours of monitoring after artifact removed  - Predominant sinus rhythm.  Heart rate in sinus 55-122, average 72 bpm  - There were 320 runs of SVT.  The fastest SVT was 6 beats with a maximum rate of 194.  The longest SVT was 8.1 seconds with an average rate of 115 bpm.  Some SVT is atrial tachycardia with variable block  - Atrial fibrillation occurred 2% of the time with heart rates in atrial fibrillation 71-188, average 104.  The longest episode of A-fib was 23 minutes 11 seconds with an average heart rate of 120 bpm.  There were 212 runs of afib and 8 episodes that were 6 minutes or longer.   - Isolated PACs comprise 1.8% of beats.  PAC couplets and triplets were rare  - Isolated PVCs were infrequent.  PVC couplets were rare.  No ventricular runs  - There were no patient events    Stress 11/23/23  Lexiscan   Abnormal ecg with occasional PVCs  There is a small fixed defect of the anterior and anteroseptal wall. Breast artifact is also possible    Cardiac cath at [facility] 12/22/23  Severe co-dominant RCA and distal LCx bifurcation disease. Elevated LVEDP.   Patient with anterior/anteroaplical ischemia on stress and primary complaint of dyspnea. LVEDP is marginally elevated and has small branch disease in distal Lcx which may require bifurcation stenting and co-dominant RCA disease   LAD - mid to distal LAD 60%  LCX mid 70%, OM 2 70%  RCA ostial 90%, prox-mid RCA 70%    Echo 02/17/24:   1.  mild concentric hypertrophy.  2.  EF  55% .  3. no regional wall motion abnormalities.  4.  grade III diastolic dysfunction. The LV end diastolic pressure is elevated.  5. Left Atrium: Severely dilated by volume measuring 49 ml/m.  6. Aortic Valve:   mild sclerosis; no stenosis.  7.  RVSP 32 mmHg    8.  Normal aortic  root  3.9 cm.  Dilated ascending aorta  4 cm   9. As compared to  01/19/2023 - aorta measures larger today and diastolic function has worsened.    Methacholine  challenge 02/22/24: result is consistent with airway hyper responsiveness.    Cath 03/22/24  left radial for access.  Severe ostial and mid/distal RCA disease, s/p PCI with 3 mid/distal stents (2.75x12, 2.75x22 and 3x22, PD to 4mm) and 1 ostial stent (3.5x13mm, PD to 4mm)   Plan for Triple therapy for 1 week and then Plavix /Eliquis  for 6 months. At that point, can continue this regiment or switch Plavix  for ASA.       Assessment and Plan:  Atrial fibrillation   New dx PAF on zio 5/25. Sxs of palps sig improved w metoprolol . Afib was low burden 2% on zio. Started on eliquis .  TSH normal 4/25  In sinus 8/25   Not getting fast HR at home  - Continue metoprolol  t 50 mg  -Continue Eliquis      CAD  Significant RCA and LCx disease but codominant.   Now s/p multiple DES to RCA 03/22/24 - complex procedure w good end result. Plan for doac/plavix  for at least 6 months.  Not completely clear how much of DOE is angina versus CHF versus other.  Given severity of ongoing sxs sent for revascularization. Feels improved but sxs remain     Meds - doac/plavix  at least 6 months, 12 months if tolerating -plan for 12 currently  Angina -see above.  DM2-has had A1c >= 6.5 x 2.  A1c 6.5 in 4/25  Tob-no  Exercise-now doing bike  Diet-patient says she avoids fatty foods  Weight-stable  Sleep-says she gets adequate sleep      HFpEF  BNP slightly elevated but may be lower than expected given obesity but does have CKD.  Elevated LVEDP on cath  -Continue  torsemide  20 mg.    -does not appear significantly volume overloaded and no evidence of pulmonary edema on chest CT 02/17/2024    DOE/fatigue  Has also seen pulmonary.  Had chest CT, methacholine  challenge that was +   Persistent fatigue and exertional dyspnea, possibly related to low blood pressure and deconditioning. Shortness of breath is  multifactorial, with heart-related causes addressed.  Improved signifincantly 1/26  - cont current meds  - Encourage gradual increase in physical activity   - Plans as above.     Htn  Goal <130/80.  Blood pressures at home are normal with normal.  -Follow on reduced dose amloidpine/valsartan     HL  With CAD goal LDL <55-70  LDL in the 80s on atorvastatin  80 mg. Added ezetimibe  7/25  LDL 49 in 10/25  -cont atorvastatin  80 and ezetimibe .    Gastrointestinal symptoms (epigastric pain and dyspepsia)  Epigastric pain and dyspepsia improved slightly with pantoprazole . Symptoms do not appear to be heart-related.  Seems slightly better w adjustment of timing of meds        Chronic kidney disease, renal artery stenosis   Follows w renal.  not taking spironolactone . On ARB   Cr 1.3-1.5  Consider SGLT2 inhibitor in the future      Fibromyalgia  Significant musculoskeletal pain affecting mobility, particularly in knees, legs, and hips. 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 Main goal bp control.  unlikely to reach the point of intervention    Dizziness   Did not come up today  Previously mentions dizziness and that she has been taking as needed meclizine .  Does seem to be  a spinning component.  Not clearly related to blood pressure the way she is describing it as she says she was dizzy when she had an  elevated blood pressure       Rtc ~ sept in [place] as they prefer tuesday    The patient verbally consented to an audio recording of their visit to assist with the completion of documentation. The patient is aware the recording is not retained after the visit is summarized.    I spent a total of   minutes on this visit on the date of service (total time includes all activities performed on the date of service such as chart review, time speaking with the patient, documenting and communicating with other providers)   Thank you for the  privilege of involving me in this patient's care.  Please feel free to contact me if you have any questions.  This patient encounter note was created using voice-recognition software and in real time. Please excuse any typographical errors that have not been edited out.     Electronically signed by: Geni LITTIE Sprang, MD

## 2024-07-11 ENCOUNTER — Ambulatory Visit: Attending: Cardiovascular Disease | Admitting: Cardiovascular Disease

## 2024-07-11 ENCOUNTER — Other Ambulatory Visit: Payer: Self-pay

## 2024-07-11 ENCOUNTER — Ambulatory Visit: Admitting: Orthopaedic Surgery

## 2024-07-11 VITALS — BP 104/66 | HR 76 | Temp 97.5°F | Ht 59.5 in | Wt 193.0 lb

## 2024-07-11 DIAGNOSIS — I150 Renovascular hypertension: Secondary | ICD-10-CM | POA: Insufficient documentation

## 2024-07-11 DIAGNOSIS — R0609 Other forms of dyspnea: Secondary | ICD-10-CM | POA: Insufficient documentation

## 2024-07-11 DIAGNOSIS — N1831 Chronic kidney disease, stage 3a: Secondary | ICD-10-CM | POA: Diagnosis not present

## 2024-07-11 DIAGNOSIS — Z6841 Body Mass Index (BMI) 40.0 and over, adult: Secondary | ICD-10-CM | POA: Insufficient documentation

## 2024-07-11 DIAGNOSIS — N1832 Chronic kidney disease, stage 3b: Secondary | ICD-10-CM | POA: Insufficient documentation

## 2024-07-11 DIAGNOSIS — I25119 Atherosclerotic heart disease of native coronary artery with unspecified angina pectoris: Secondary | ICD-10-CM | POA: Insufficient documentation

## 2024-07-11 DIAGNOSIS — M25512 Pain in left shoulder: Secondary | ICD-10-CM | POA: Diagnosis present

## 2024-07-11 DIAGNOSIS — E66813 Obesity, class 3: Secondary | ICD-10-CM | POA: Diagnosis not present

## 2024-07-11 DIAGNOSIS — G8929 Other chronic pain: Secondary | ICD-10-CM | POA: Insufficient documentation

## 2024-07-11 DIAGNOSIS — I10 Essential (primary) hypertension: Secondary | ICD-10-CM | POA: Diagnosis not present

## 2024-07-11 LAB — EKG

## 2024-07-11 MED ORDER — TORSEMIDE 10 MG PO TABS: 20.0000 mg | ORAL_TABLET | Freq: Every day | ORAL | 1 refills | Status: AC

## 2024-07-11 MED ORDER — METHYLPREDNISOLONE ACETATE 40 MG/ML IJ SUSP: 40.0000 mg | Freq: Once | INTRAMUSCULAR | 0 refills | Status: AC

## 2024-07-11 NOTE — Patient Instructions (Addendum)
 Put an ice pack on your shoulder at the injection site for 10-15 minutes later today.    Aplique uma compressa de gelo no ombro, no local da injeo, por 10 a 15 minutos ainda hoje.

## 2024-07-11 NOTE — Progress Notes (Signed)
 Date of service: 07/11/2024    This is a 69 year old year old female who returns for follow up of her chronic diffuse spine pain.  I had seen her in previous years for multiple areas of pain in her upper arms her hips and her lower legs.  Over the past year she reports persistent symptoms but predominantly in her left shoulder.  She reports the pain is over the side of the shoulder and going to the upper arm but denies numbness or tingling.  She has never done formal physical therapy but has been given home exercises.  She is unable to take NSAID medications.  She returns today requesting injections for her pain.        Physical examination:   General - the patient is alert and oriented x3, well appearing, in no acute distress.  Normal affect and mood.  Left shoulder demonstrates no focal tenderness to palpation.  Full active range of motion.  Provocative testing unremarkable.  Strength 5/5 in elevation and rotation.  Neurovascularly intact         Assessement: Chronic left shoulder pain.    Plan: I reviewed the findings with the patient and her daughters using a telephone interpreter.  Her symptoms are similar to what I have seen her for in the past with multiple areas of myofascial complaint without more focal evidence of structural pathology.  This does again seem consistent with a probable myalgia condition..  She has chronic pain and cannot take NSAID medications due to renal insufficiency.  She did request an injection.  I stated we can certainly try an injection at this point but I have a lower suspicion able provide significant improvement in her symptoms.  I did recommend she follow-up with her PCP for further recommendations and likely consideration for referral to a pain management center.  Nonetheless she did wish to proceed with a left shoulder injection today. A time out was held confirming the site of injection and laterality. After verbal consent, reviewing risks including pain and infection,  and  sterile prep, the left shoulder subacromial space was injected with a combination of 40 mg of methylprednisolone  and 9 cc of 0.5% Marcaine  via a posterior approach.  A bandage was applied.  There were no complications.  I advised the patient to ice the area down later today.  If the injection does not provide significant durable benefit, it may be repeated at a minimum of 4 months or longer depending on symptoms.  If it does not help very much, there is no utility in repeating it.  She will follow-up with me as needed.    All questions were answered.    This note was prepared using voice recognition software.  Please disregard any transcription errors.

## 2024-07-25 ENCOUNTER — Encounter (HOSPITAL_BASED_OUTPATIENT_CLINIC_OR_DEPARTMENT_OTHER): Payer: Self-pay | Admitting: Cardiovascular Disease

## 2024-07-25 NOTE — Telephone Encounter (Signed)
 PER who is calling: Pharmacy, Sara Snyder is a 69 year old female has requested a refill of       elquis.              Last Office Visit  : 04/11/2024 Burnetta Geni CROME, MD      Last Tele Visit:  Visit date not found      Last Physical Exam:  06/13/2020    PAP SMEAR due on 04/09/2024    Other Med Adult:  Most Recent BP Reading(s)  07/11/24 : 104/66        Cholesterol (mg/dL)   Date Value   89/78/7974 100     LOW DENSITY LIPOPROTEIN DIRECT (mg/dL)   Date Value   89/78/7974 49     HIGH DENSITY LIPOPROTEIN (mg/dL)   Date Value   89/78/7974 44     TRIGLYCERIDES (mg/dL)   Date Value   89/78/7974 106         THYROID  SCREEN TSH REFLEX FT4 (uIU/mL)   Date Value   10/20/2023 3.430         TSH (THYROID  STIM HORMONE) (uIU/mL)   Date Value   01/14/2021 7.090 (H)       HEMOGLOBIN A1C (%)   Date Value   12/03/2022 6.9 (H)       POC HEMOGLOBIN A1C (%)   Date Value   10/20/2023 6.5 (H)         INR (no units)   Date Value   12/10/2023 1.8       SODIUM (mmol/L)   Date Value   06/14/2024 140       POTASSIUM (mmol/L)   Date Value   06/14/2024 4.1           CREATININE (mg/dL)   Date Value   87/75/7974 1.4 (H)       Documented patient preferred pharmacies:    [facility], [place] - [address]  Phone: [phone] Fax: [phone]

## 2024-07-26 ENCOUNTER — Encounter (HOSPITAL_BASED_OUTPATIENT_CLINIC_OR_DEPARTMENT_OTHER): Payer: Self-pay

## 2024-07-26 ENCOUNTER — Encounter (HOSPITAL_BASED_OUTPATIENT_CLINIC_OR_DEPARTMENT_OTHER): Payer: Self-pay | Admitting: Pulmonary Disease

## 2024-07-26 MED ORDER — APIXABAN 5 MG PO TABS: 5.0000 mg | ORAL_TABLET | Freq: Two times a day (BID) | ORAL | 3 refills | Status: AC

## 2024-07-28 ENCOUNTER — Ambulatory Visit

## 2024-07-31 ENCOUNTER — Ambulatory Visit

## 2024-08-30 ENCOUNTER — Ambulatory Visit

## 2024-09-06 ENCOUNTER — Ambulatory Visit: Admitting: Ophthalmology

## 2024-11-07 ENCOUNTER — Ambulatory Visit: Admitting: Pulmonary Disease

## 2024-12-06 ENCOUNTER — Ambulatory Visit

## 2025-02-27 ENCOUNTER — Ambulatory Visit: Admitting: Cardiovascular Disease
